# Patient Record
Sex: Female | Born: 1935 | Race: White | Hispanic: No | Smoking: Never smoker
Health system: Southern US, Community
[De-identification: ages and names within clinical notes are randomized; demographics above are authoritative.]

## PROBLEM LIST (undated history)

## (undated) DIAGNOSIS — S060X9A Concussion with loss of consciousness of unspecified duration, initial encounter: Secondary | ICD-10-CM

## (undated) DIAGNOSIS — I4891 Unspecified atrial fibrillation: Secondary | ICD-10-CM

## (undated) DIAGNOSIS — C801 Malignant (primary) neoplasm, unspecified: Secondary | ICD-10-CM

## (undated) DIAGNOSIS — I10 Essential (primary) hypertension: Secondary | ICD-10-CM

## (undated) DIAGNOSIS — M810 Age-related osteoporosis without current pathological fracture: Secondary | ICD-10-CM

## (undated) HISTORY — PX: JOINT REPLACEMENT: SHX530

## (undated) HISTORY — DX: Age-related osteoporosis without current pathological fracture: M81.0

## (undated) HISTORY — PX: BOWEL RESECTION: SHX1257

## (undated) HISTORY — PX: COLONOSCOPY W/ POLYPECTOMY: SHX1380

---

## 1958-09-22 DIAGNOSIS — S060X9A Concussion with loss of consciousness of unspecified duration, initial encounter: Secondary | ICD-10-CM

## 1958-09-22 DIAGNOSIS — S060XAA Concussion with loss of consciousness status unknown, initial encounter: Secondary | ICD-10-CM

## 1958-09-22 HISTORY — DX: Concussion with loss of consciousness of unspecified duration, initial encounter: S06.0X9A

## 1958-09-22 HISTORY — DX: Concussion with loss of consciousness status unknown, initial encounter: S06.0XAA

## 1979-09-23 HISTORY — PX: BREAST LUMPECTOMY: SHX2

## 1999-02-08 ENCOUNTER — Other Ambulatory Visit: Admission: RE | Admit: 1999-02-08 | Discharge: 1999-02-08 | Payer: Self-pay | Admitting: Cardiology

## 2000-01-31 ENCOUNTER — Ambulatory Visit (HOSPITAL_COMMUNITY): Admission: RE | Admit: 2000-01-31 | Discharge: 2000-01-31 | Payer: Self-pay | Admitting: Gastroenterology

## 2001-04-27 ENCOUNTER — Other Ambulatory Visit: Admission: RE | Admit: 2001-04-27 | Discharge: 2001-04-27 | Payer: Self-pay | Admitting: Family Medicine

## 2002-01-28 ENCOUNTER — Ambulatory Visit (HOSPITAL_COMMUNITY): Admission: RE | Admit: 2002-01-28 | Discharge: 2002-01-28 | Payer: Self-pay | Admitting: Gastroenterology

## 2002-01-28 ENCOUNTER — Encounter (INDEPENDENT_AMBULATORY_CARE_PROVIDER_SITE_OTHER): Payer: Self-pay | Admitting: Specialist

## 2005-04-21 ENCOUNTER — Encounter: Admission: RE | Admit: 2005-04-21 | Discharge: 2005-04-21 | Payer: Self-pay | Admitting: Surgery

## 2005-05-05 ENCOUNTER — Encounter (INDEPENDENT_AMBULATORY_CARE_PROVIDER_SITE_OTHER): Payer: Self-pay | Admitting: Specialist

## 2005-05-05 ENCOUNTER — Inpatient Hospital Stay (HOSPITAL_COMMUNITY): Admission: RE | Admit: 2005-05-05 | Discharge: 2005-05-09 | Payer: Self-pay | Admitting: Surgery

## 2009-05-22 ENCOUNTER — Encounter: Admission: RE | Admit: 2009-05-22 | Discharge: 2009-05-22 | Payer: Self-pay | Admitting: Neurology

## 2009-09-22 HISTORY — PX: FEMUR SURGERY: SHX943

## 2010-02-07 ENCOUNTER — Inpatient Hospital Stay (HOSPITAL_COMMUNITY): Admission: EM | Admit: 2010-02-07 | Discharge: 2010-02-12 | Payer: Self-pay | Admitting: Emergency Medicine

## 2010-05-08 ENCOUNTER — Encounter: Admission: RE | Admit: 2010-05-08 | Discharge: 2010-05-08 | Payer: Self-pay | Admitting: Neurology

## 2010-10-13 ENCOUNTER — Encounter: Payer: Self-pay | Admitting: Neurology

## 2010-12-09 LAB — CBC
HCT: 26.9 % — ABNORMAL LOW (ref 36.0–46.0)
Hemoglobin: 11 g/dL — ABNORMAL LOW (ref 12.0–15.0)
Hemoglobin: 8.9 g/dL — ABNORMAL LOW (ref 12.0–15.0)
Hemoglobin: 9.4 g/dL — ABNORMAL LOW (ref 12.0–15.0)
MCV: 90.5 fL (ref 78.0–100.0)
Platelets: 156 10*3/uL (ref 150–400)
Platelets: 175 10*3/uL (ref 150–400)
RBC: 3.06 MIL/uL — ABNORMAL LOW (ref 3.87–5.11)
RBC: 3.11 MIL/uL — ABNORMAL LOW (ref 3.87–5.11)
RBC: 3.63 MIL/uL — ABNORMAL LOW (ref 3.87–5.11)
RDW: 13.3 % (ref 11.5–15.5)
RDW: 13.7 % (ref 11.5–15.5)
RDW: 14.1 % (ref 11.5–15.5)
WBC: 13.6 10*3/uL — ABNORMAL HIGH (ref 4.0–10.5)
WBC: 5.8 10*3/uL (ref 4.0–10.5)
WBC: 6.3 10*3/uL (ref 4.0–10.5)

## 2010-12-09 LAB — BASIC METABOLIC PANEL
CO2: 27 mEq/L (ref 19–32)
Chloride: 102 mEq/L (ref 96–112)
Creatinine, Ser: 0.77 mg/dL (ref 0.4–1.2)
GFR calc Af Amer: >60 mL/min (ref >60)
Potassium: 3.3 mEq/L — ABNORMAL LOW (ref 3.5–5.1)
Sodium: 138 mEq/L (ref 135–145)

## 2010-12-09 LAB — POCT I-STAT, CHEM 8
Creatinine, Ser: 0.9 mg/dL (ref 0.4–1.2)
Glucose, Bld: 141 mg/dL — ABNORMAL HIGH (ref 70–99)
TCO2: 21 mmol/L (ref 0–100)

## 2010-12-09 LAB — HEMOGLOBIN AND HEMATOCRIT, BLOOD: HCT: 27.2 % — ABNORMAL LOW (ref 36.0–46.0)

## 2010-12-09 LAB — ABO/RH: ABO/RH(D): A POS

## 2011-02-07 NOTE — Discharge Summary (Signed)
Gina Mcgee, Gina Mcgee               ACCOUNT NO.:  [id number]   MEDICAL RECORD NO.:  [id number]          PATIENT TYPE:  INP   LOCATION:  1621                         FACILITY:  [facility]   PHYSICIAN:  Currie Paris, M.D.DATE OF BIRTH:  20-Jan-1930   DATE OF ADMISSION:  05/05/2005  DATE OF DISCHARGE:  05/09/2005                                 DISCHARGE SUMMARY   CCS#:  [id number].   FINAL DIAGNOSES:  1.  Sigmoid diverticulosis with diverticular stricture.  2.  Tubovillous adenoma of sigmoid.   CLINICAL HISTORY:  Ms. Cua is a 77 year old lady whose been having  chronic problems with diverticular, stricture and colon symptoms from that  as well as known diverticulosis. She agreed to proceed to sigmoid colectomy.   HOSPITAL COURSE:  The patient was admitted and taken to the operating room  where a sigmoid colectomy was accomplished. She tolerated the procedure  well.   Postoperatively, she had a relatively benign course. She was able to start  on liquids, advance her diet and by the fourth postoperative day was ready  to be discharged. She did develop some mild postoperative hypokalemia which  responded to some increased potassium. By August 18, she was tolerating a  diet, virtually pain free, ambulating. Her lungs were clear and the abdomen  was benign. She was discharged.   DISCHARGE/PLAN:  Tylox for pain and to resume all usual home medications.  She is to follow-up in my office in approximately 1 week.   LABORATORY DATA:  Laboratory data included pathology report which shows  diverticulosis and an incidentally found tubovillous adenoma. Other  laboratory studies included a hemoglobin of 13.8 and a postoperative  hemoglobin of 11.0 which I thought was more equilibration than significant  blood loss. Electrolytes were basically unremarkable. Urine was negative.  EKG was normal.      Currie Paris, M.D.  Electronically Signed     CJS/MEDQ  D:  06/02/2005  T:   06/02/2005  Job:  [id number]   cc:   Ernestina Penna, M.D.  [address] [place]  [place] [address]  Fax: [phone]   Tasia Catchings, M.D.  [address]  [place]  [place] [address]  Fax: [phone]

## 2011-02-07 NOTE — Op Note (Signed)
[name], Mcgee               ACCOUNT NO.:  [id number]   MEDICAL RECORD NO.:  [id number]          PATIENT TYPE:  INP   LOCATION:  NA                           FACILITY:  [facility]   PHYSICIAN:  Currie Paris, M.D.DATE OF BIRTH:  [birth year]   DATE OF PROCEDURE:  05/05/2005  DATE OF DISCHARGE:                                 OPERATIVE REPORT   OFFICE MEDICAL RECORD NUMBER:  [id number].   PREOPERATIVE DIAGNOSIS:  Sigmoid diverticular disease with sigmoid  stricture.   POSTOPERATIVE DIAGNOSIS:  Sigmoid diverticular disease with sigmoid  stricture.   OPERATION:  Sigmoid colectomy with primary anastomosis.   SURGEON:  Dr. Jamey Ripa.   ASSISTANTS:  Dr. Lebron Conners, also scrubbed [name], [place] student.   ANESTHESIA:  General endotracheal.   CLINICAL HISTORY:  This is a 77 year old lady with chronic diverticular  disease, who has a stricture that would barely admit a pediatric  colonoscope.  Because of ongoing symptoms, she elected proceed to sigmoid  colectomy.  She has diffuse pandiverticulosis, but most of the disease was  confined to the sigmoid colon.   DESCRIPTION OF PROCEDURE:  The patient seen in the holding area, and she had  no further questions.  She was taken to the operating room and after  satisfactory general endotracheal anesthesia had been obtained, Foley  catheter was placed and the abdomen prepped and draped.  The time-out  occurred.   I made a midline incision from umbilicus to symphysis and entered the  peritoneal cavity in the midline.  Exam of the abdomen showed a normal  uterus with atrophic ovaries consistent with her age.  There was marked  diverticula disease with the left tube stuck to the sigmoid and marked  tortuosity and what appeared to be some narrowing in the proximal sigmoid.   After exploration, I placed a wound protector and self-retaining retractors  and packed the small bowel away.   I freed up the sigmoid colon from mid  descending all the way down to the  rectum using sharp and blunt dissection and opening the white line of Toldt.  I selected a place above the junction of the descending and sigmoid colon  where itappeared to be relatively free of diverticular disease with most of  the disease distal to this spot.  I made a window in the mesentery, placed  bowel clamps, and divided the bowel.  The colon mesentery was then divided  from that point down to the rectosigmoid junction where the tinea had ended.  I stayed close to the colon.  I also identified the ureter prior to  beginning the mesenteric division and at the end to make sure that it was  undamaged, and it appeared intact throughout.   I then put some stay sutures on and a Satinsky clamp for a cutting guide and  divided the rectum.  The two ends overlapped by about 3 inches so there was  no tension.   I then did a hand sewn end-to-end anastomosis using 3-0 silks, full-  thickness, inverting the anterior layer.  The anastomosis was widely patent  and appeared completely intact with no areas where anything appeared to be  separated.  The mesenteric defect was closed with a single 3-0 silk.  At  this point, we changed gloves.  I irrigated and made sure everything was  dry.  I then used Tisseel and placed it circumferentially on the anastomosis  just as an extra layer of reinforcement.   The intestinal contents were then replaced to their anatomic location and  the abdomen closed.  I used a 0 PDS on the posterior sheath peritoneum and  #1 running PDS on the anterior sheath with staples on the skin.  The wound  was irrigated between layers for closure.   The patient tolerated procedure well.  There were no operative  complications.  All counts were correct.      Currie Paris, M.D.  Electronically Signed     CJS/MEDQ  D:  05/05/2005  T:  05/05/2005  Job:  [id number]   cc:   Ernestina Penna, M.D.  [address] [place]  [place]  [address]  Fax: [phone]   Tasia Catchings, M.D.  [address]  [place]  [place] [address]  Fax: [phone]

## 2011-02-10 ENCOUNTER — Other Ambulatory Visit: Payer: Self-pay | Admitting: Gastroenterology

## 2011-11-20 ENCOUNTER — Observation Stay (HOSPITAL_COMMUNITY)
Admission: EM | Admit: 2011-11-20 | Discharge: 2011-11-21 | Disposition: A | Discharge status: Home / Self Care | Payer: Medicare Other | Attending: Internal Medicine | Admitting: Internal Medicine

## 2011-11-20 ENCOUNTER — Emergency Department (HOSPITAL_COMMUNITY): Payer: Medicare Other

## 2011-11-20 ENCOUNTER — Encounter (HOSPITAL_COMMUNITY): Payer: Self-pay | Admitting: Family Medicine

## 2011-11-20 DIAGNOSIS — M899 Disorder of bone, unspecified: Secondary | ICD-10-CM | POA: Insufficient documentation

## 2011-11-20 DIAGNOSIS — M25559 Pain in unspecified hip: Secondary | ICD-10-CM | POA: Diagnosis present

## 2011-11-20 DIAGNOSIS — C50919 Malignant neoplasm of unspecified site of unspecified female breast: Secondary | ICD-10-CM | POA: Diagnosis not present

## 2011-11-20 DIAGNOSIS — S32599A Other specified fracture of unspecified pubis, initial encounter for closed fracture: Secondary | ICD-10-CM

## 2011-11-20 DIAGNOSIS — E871 Hypo-osmolality and hyponatremia: Secondary | ICD-10-CM | POA: Diagnosis not present

## 2011-11-20 DIAGNOSIS — R29818 Other symptoms and signs involving the nervous system: Secondary | ICD-10-CM | POA: Diagnosis not present

## 2011-11-20 DIAGNOSIS — E876 Hypokalemia: Secondary | ICD-10-CM | POA: Diagnosis not present

## 2011-11-20 DIAGNOSIS — S329XXA Fracture of unspecified parts of lumbosacral spine and pelvis, initial encounter for closed fracture: Secondary | ICD-10-CM

## 2011-11-20 DIAGNOSIS — W19XXXA Unspecified fall, initial encounter: Secondary | ICD-10-CM

## 2011-11-20 DIAGNOSIS — S32509A Unspecified fracture of unspecified pubis, initial encounter for closed fracture: Secondary | ICD-10-CM | POA: Diagnosis not present

## 2011-11-20 DIAGNOSIS — I10 Essential (primary) hypertension: Secondary | ICD-10-CM | POA: Diagnosis not present

## 2011-11-20 DIAGNOSIS — Z853 Personal history of malignant neoplasm of breast: Secondary | ICD-10-CM | POA: Diagnosis not present

## 2011-11-20 DIAGNOSIS — Y92009 Unspecified place in unspecified non-institutional (private) residence as the place of occurrence of the external cause: Secondary | ICD-10-CM | POA: Insufficient documentation

## 2011-11-20 DIAGNOSIS — C801 Malignant (primary) neoplasm, unspecified: Secondary | ICD-10-CM

## 2011-11-20 DIAGNOSIS — N949 Unspecified condition associated with female genital organs and menstrual cycle: Secondary | ICD-10-CM | POA: Diagnosis not present

## 2011-11-20 DIAGNOSIS — S060XAA Concussion with loss of consciousness status unknown, initial encounter: Secondary | ICD-10-CM | POA: Diagnosis present

## 2011-11-20 DIAGNOSIS — R52 Pain, unspecified: Secondary | ICD-10-CM | POA: Insufficient documentation

## 2011-11-20 DIAGNOSIS — S060X0A Concussion without loss of consciousness, initial encounter: Secondary | ICD-10-CM | POA: Diagnosis not present

## 2011-11-20 DIAGNOSIS — Y998 Other external cause status: Secondary | ICD-10-CM | POA: Insufficient documentation

## 2011-11-20 DIAGNOSIS — W010XXA Fall on same level from slipping, tripping and stumbling without subsequent striking against object, initial encounter: Secondary | ICD-10-CM | POA: Insufficient documentation

## 2011-11-20 DIAGNOSIS — T148XXA Other injury of unspecified body region, initial encounter: Secondary | ICD-10-CM | POA: Diagnosis not present

## 2011-11-20 HISTORY — DX: Essential (primary) hypertension: I10

## 2011-11-20 HISTORY — DX: Unspecified fall, initial encounter: W19.XXXA

## 2011-11-20 HISTORY — DX: Concussion with loss of consciousness of unspecified duration, initial encounter: S06.0X9A

## 2011-11-20 HISTORY — DX: Malignant (primary) neoplasm, unspecified: C80.1

## 2011-11-20 HISTORY — DX: Fracture of unspecified parts of lumbosacral spine and pelvis, initial encounter for closed fracture: S32.9XXA

## 2011-11-20 LAB — CBC
Hemoglobin: 12.3 g/dL (ref 12.0–15.0)
MCH: 29.3 pg (ref 26.0–34.0)
MCHC: 33.2 g/dL (ref 30.0–36.0)
MCV: 88.3 fL (ref 78.0–100.0)
RBC: 4.2 MIL/uL (ref 3.87–5.11)

## 2011-11-20 LAB — BASIC METABOLIC PANEL
CO2: 27 mEq/L (ref 19–32)
Calcium: 9.2 mg/dL (ref 8.4–10.5)
Creatinine, Ser: 0.6 mg/dL (ref 0.50–1.10)
GFR calc non Af Amer: 84 mL/min — ABNORMAL LOW (ref >90)
Glucose, Bld: 109 mg/dL — ABNORMAL HIGH (ref 70–99)

## 2011-11-20 MED ORDER — DOCUSATE SODIUM 100 MG PO CAPS
100.0000 mg | ORAL_CAPSULE | Freq: Two times a day (BID) | ORAL | Status: DC
  Administered 2011-11-21: 100 mg via ORAL
  Filled 2011-11-20 (×5): qty 1

## 2011-11-20 MED ORDER — POLYETHYLENE GLYCOL 3350 17 G PO PACK
17.0000 g | PACK | Freq: Every day | ORAL | Status: DC | PRN
  Filled 2011-11-20: qty 1

## 2011-11-20 MED ORDER — ACETAMINOPHEN 325 MG PO TABS: 650.0000 mg | ORAL_TABLET | Freq: Four times a day (QID) | ORAL | Status: DC | PRN

## 2011-11-20 MED ORDER — PROSIGHT PO TABS
1.0000 | ORAL_TABLET | Freq: Every day | ORAL | Status: DC
  Administered 2011-11-21: 1 via ORAL
  Filled 2011-11-20 (×2): qty 1

## 2011-11-20 MED ORDER — ACETAMINOPHEN 650 MG RE SUPP: 650.0000 mg | Freq: Four times a day (QID) | RECTAL | Status: DC | PRN

## 2011-11-20 MED ORDER — VITAMIN D3 25 MCG (1000 UT) PO CAPS: 1.0000 | ORAL_CAPSULE | Freq: Every day | ORAL | Status: DC

## 2011-11-20 MED ORDER — POTASSIUM CHLORIDE CRYS ER 20 MEQ PO TBCR
20.0000 meq | EXTENDED_RELEASE_TABLET | Freq: Once | ORAL | Status: AC
  Administered 2011-11-20: 20 meq via ORAL
  Filled 2011-11-20: qty 1

## 2011-11-20 MED ORDER — VITAMIN D3 25 MCG (1000 UNIT) PO TABS
1000.0000 [IU] | ORAL_TABLET | Freq: Every day | ORAL | Status: DC
  Filled 2011-11-20 (×2): qty 1

## 2011-11-20 MED ORDER — CALCIUM CARBONATE 1250 (500 CA) MG PO TABS
1250.0000 mg | ORAL_TABLET | Freq: Every day | ORAL | Status: DC
  Filled 2011-11-20 (×2): qty 1

## 2011-11-20 MED ORDER — HYDROCODONE-ACETAMINOPHEN 5-325 MG PO TABS
1.0000 | ORAL_TABLET | Freq: Four times a day (QID) | ORAL | Status: DC | PRN
  Administered 2011-11-21 (×2): 2 via ORAL
  Filled 2011-11-20 (×2): qty 2

## 2011-11-20 MED ORDER — ICAPS AREDS FORMULA PO TABS: 1.0000 | ORAL_TABLET | Freq: Every day | ORAL | Status: DC

## 2011-11-20 MED ORDER — HYDROCHLOROTHIAZIDE 25 MG PO TABS
25.0000 mg | ORAL_TABLET | Freq: Every day | ORAL | Status: DC
  Filled 2011-11-20: qty 1

## 2011-11-20 MED ORDER — MORPHINE SULFATE 2 MG/ML IJ SOLN: 1.0000 mg | INTRAMUSCULAR | Status: DC | PRN

## 2011-11-20 MED ORDER — SENNA 8.6 MG PO TABS
1.0000 | ORAL_TABLET | Freq: Two times a day (BID) | ORAL | Status: DC
  Administered 2011-11-21: 8.6 mg via ORAL
  Filled 2011-11-20: qty 1

## 2011-11-20 MED ORDER — ENOXAPARIN SODIUM 40 MG/0.4ML ~~LOC~~ SOLN
40.0000 mg | Freq: Every day | SUBCUTANEOUS | Status: DC
  Filled 2011-11-20 (×2): qty 0.4

## 2011-11-20 NOTE — ED Provider Notes (Signed)
History     CSN: [id number]  Arrival date & time 11/20/11  1630   First MD Initiated Contact with Patient 11/20/11 1639      Chief Complaint  Patient presents with  . Hip Pain  . Fall    (Consider location/radiation/quality/duration/timing/severity/associated sxs/prior treatment) Patient is a 78 y.o. female presenting with hip pain and fall. The history is provided by the patient.  Hip Pain Associated symptoms include arthralgias. Pertinent negatives include no abdominal pain, chest pain, chills, fever, nausea, numbness, rash, vomiting or weakness.  Fall Pertinent negatives include no fever, no numbness, no abdominal pain, no nausea and no vomiting.   78 year old female with history of hypertension presents with left hip pain status post fall. She states that she was walking in the kitchen earlier this afternoon when her dog ran in front of her and tripped her. She fell on her left hip on the tile floor. She was unable to get up on her own. She is status post ORIF of the left hip, which was performed by Dr. Rennis Chris several years ago. Denies any distal numbness or weakness. She denies hitting her head or loss of consciousness. Denies nausea, vomiting, abdominal pain, chest pain, shortness of breath. She denies any other musculoskeletal pain.  Past Medical History  Diagnosis Date  . 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 Cancer     left-sided breast cancer    Past Surgical History  Procedure Date  . Joint replacement     History reviewed. No pertinent family history.  History  Substance Use Topics  . Smoking status: Not on file  . Smokeless tobacco: Not on file  . Alcohol Use:     OB History    Grav Para Term Preterm Abortions TAB SAB Ect Mult Living                  Review of Systems  Constitutional: Negative for fever, chills, activity change and appetite change.  HENT: Negative.   Eyes: Negative.   Respiratory: Negative for chest tightness and  shortness of breath.   Cardiovascular: Negative for chest pain and palpitations.  Gastrointestinal: Negative for nausea, vomiting, abdominal pain and abdominal distention.  Genitourinary: Positive for pelvic pain.  Musculoskeletal: Positive for arthralgias.  Skin: Negative for color change and rash.  Neurological: Negative for dizziness, weakness and numbness.    Allergies  Review of patient's allergies indicates no known allergies.  Home Medications  No current outpatient prescriptions on file.  BP 122/99  Pulse 94  Temp(Src) 97.6 F (36.4 C) (Oral)  Resp 18  SpO2 95%  Physical Exam  Nursing note and vitals reviewed. Constitutional: She is oriented to person, place, and time. She appears well-developed and well-nourished. No distress.  HENT:  Head: Normocephalic and atraumatic.  Eyes: EOM are normal. Pupils are equal, round, and reactive to light.  Neck: Normal range of motion.  Cardiovascular: Normal rate, regular rhythm and normal heart sounds.   Pulmonary/Chest: Effort normal and breath sounds normal. She has no wheezes. She exhibits no tenderness.  Abdominal: Soft. Bowel sounds are normal. There is no tenderness. There is no rebound and no guarding.  Musculoskeletal:       Bony tenderness to palpation over the right pelvis and posterior hip. There is a well-healed surgical scar noted. She is able to move, although this causes severe pain. There is no shortening or rotation noted. She is able to wiggle her toes. Lower extremity is neurovascularly intact  bilaterally with sensory intact to light touch. DP/PT pulses intact. Capillary refill less than 3 seconds.  Neurological: She is alert and oriented to person, place, and time.  Skin: Skin is warm and dry. No rash noted. She is not diaphoretic.  Psychiatric: She has a normal mood and affect.    ED Course  Procedures (including critical care time)  Labs Reviewed  BASIC METABOLIC PANEL - Abnormal; Notable for the following:     Sodium 134 (*)    Potassium 3.4 (*)    Glucose, Bld 109 (*)    GFR calc non Af Amer 84 (*)    All other components within normal limits  CBC   Dg Sacrum/coccyx  11/20/2011  *RADIOLOGY REPORT*  Clinical Data: Left hip and pelvic pain, question sacral fracture  SACRUM AND COCCYX - 2+ VIEW  Comparison: None  Findings: Severe osseous demineralization limits exam. SI joints appear symmetric and preserved. Orthopedic hardware proximal left femur. No definite sacrococcygeal fracture identified.  IMPRESSION: Severe osseous demineralization. No definite acute bony abnormalities.  Original Report Authenticated By: Lollie Marrow, M.D.   Dg Hip Complete Left  11/20/2011  *RADIOLOGY REPORT*  Clinical Data: Fall.  Left hip pain.  LEFT HIP - COMPLETE 2+ VIEW  Comparison: 02/10/2010  Findings: Left hip gamma nail remains in place with single distal interlocking screw.  Progressive calcification above the left greater trochanter appears chronic, and there is also progressive spurring along the lesser trochanter which has a chronic appearance.  Mildly progressive spurring of the left femoral head noted. Deformity from the prior intertrochanteric fracture is present.  There are acute fractures of the left superior and inferior pubic rami.  There is a high likelihood of other pelvic fractures, most likely a sacral fracture, based on typical fracture patterns.  Bony demineralization noted.  IMPRESSION: 1.  Acute fractures of the left pubic rami noted.  There is very likely a sacral fracture. 2.  No acute left proximal femoral fracture noted; a gamma nail remains in place.  Original Report Authenticated By: Dellia Cloud, M.D.   I personally reviewed the plain films.   1. Fracture of pubic ramus       MDM  Pt s/p ORIF of L hip who fell striking her left hip at home today. Her plain films show that the hardware is in place without surrounding fracture but there appears to be 2 fractures of the L pubic  rami. Nursing staff attempted to ambulate the pt; she was able to help with transfers but could not bear weight at all on the leg. Given this, will plan to admit for pain control, rehab. Pt and family agreeable to plan.  8:00 PM Discussed the case with Dr. Betti Cruz with [facility], who agrees to admit.       Grant Fontana, [place] 11/20/11 2012

## 2011-11-20 NOTE — ED Notes (Signed)
Per EMS: Pt from home. States dogs ran between her legs and tripped her and she landed on left hip. Reports left hip replacement x2 years ago. Denies loc or hitting her head. Pt was given of fentanyl pta.

## 2011-11-20 NOTE — ED Notes (Signed)
Bed:WA13<BR> Expected date:<BR> Expected time:<BR> Means of arrival:<BR> Comments:<BR> EMS

## 2011-11-20 NOTE — ED Provider Notes (Signed)
5:48 PM  I performed a history and physical examination of Gina Mcgee and discussed her management with Grant Fontana.  I agree with the history, physical, assessment, and plan of care, with the following exceptions: None  I've seen the patient face-to-face and personally evaluated her. She is in no apparent distress, awake, alert, and oriented appropriately. She is normocephalic and atraumatic with regard to her craniofacial structures. She has no neck or back pain. Her breathing is regular and unlabored with normal oxygen saturation. She has no abdominal tenderness. Her pelvis is stable. Her left lower extremity, she has some tenderness to palpation at the proximal and posterior left thigh, without deformity or bruising. She has distal neurovascular status intact, and has only mild discomfort when rolling the left lower extremity through inversion and eversion.  I was present for the following procedures: None Time Spent in Critical Care of the patient: None Time spent in discussions with the patient and family: 5 minutes  Yohannes Waibel D    Felisa Bonier, MD 11/20/11 [phone]

## 2011-11-20 NOTE — ED Provider Notes (Signed)
Evaluation and management procedures were performed by the PA/NP under my supervision/collaboration.  I evaluated this patient face-to-face at the time of encounter.  Please see my note dated at that time.   Felisa Bonier, MD 11/20/11 2034

## 2011-11-20 NOTE — H&P (Addendum)
Patient's PCP: Redmond Baseman, MD, MD Patient's orthopedic physician: Dr. Rennis Chris  Chief Complaint: Fall and hip pain  History of Present Illness: Gina Mcgee is a 78 y.o. Caucasian female with history of mild hypertension, left-sided breast cancer, and history of concussion with left-sided neuro deficits who presented with the above complaints.  At approximately 345 p.m. today patient was told by her dog to the left side in her home and landed on her left side and had sharp pain.  In the emergency department patient had imaging which showed acute fractures of the left pubic rami.  Patient attempted to walk but had severe pain and could not bear weight as a result the hospitalist service was asked to admit the patient for further care and management.  Patient denies any fevers, chills, nausea, vomiting, chest pain, shortness of breath, abdominal pain, headaches or vision changes.  Reports her health has been good prior to today's event.  Meds: Scheduled Meds:   . potassium chloride  20 mEq Oral Once   Continuous Infusions:  PRN Meds:. Allergies: Aspirin; Fish allergy; Nsaids; Aloe; and Sulfa antibiotics Past Medical History  Diagnosis Date  . 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 Cancer     left-sided breast cancer  . HTN (hypertension)    Past Surgical History  Procedure Date  . Joint replacement    Family History  Problem Relation Age of Onset  . Stroke Mother   . Lung cancer Father   . Stroke Father    History   Social History  . Marital Status: Single    Spouse Name: N/A    Number of Children: N/A  . Years of Education: N/A   Occupational History  . Not on file.   Social History Main Topics  . Smoking status: Never Smoker   . Smokeless tobacco: Not on file  . Alcohol Use: Yes     1 beer 2-3 weeks.  . Drug Use:   . Sexually Active:    Other Topics Concern  . Not on file   Social History Narrative  . No narrative on file   Review  of Systems: All systems reviewed with the patient and positive as per history of present illness, otherwise all other systems are negative.  Physical Exam: Blood pressure 141/58, pulse 85, temperature 98.1 F (36.7 C), temperature source Oral, resp. rate 20, SpO2 95.00%. General: Awake, Oriented x3, No acute distress. HEENT: EOMI, Moist mucous membranes Neck: Supple CV: S1 and S2 Lungs: Clear to ascultation bilaterally Abdomen: Soft, Nontender, Nondistended, +bowel sounds. Ext: Good pulses. Trace edema. No clubbing or cyanosis noted.  Attempted to raise her right and left leg passively for hip flexion, patient had severe pain on the left. Neuro: Cranial Nerves II-XII grossly intact. Has 5/5 motor strength in upper and lower extremities.  Lab results:  [facility] 11/20/11 1815  NA 134*  K 3.4*  CL 97  CO2 27  GLUCOSE 109*  BUN 17  CREATININE 0.60  CALCIUM 9.2  MG --  PHOS --   No results found for this basename: AST:2,ALT:2,ALKPHOS:2,BILITOT:2,PROT:2,ALBUMIN:2 in the last 72 hours No results found for this basename: LIPASE:2,AMYLASE:2 in the last 72 hours  Basename 11/20/11 1815  WBC 9.9  NEUTROABS --  HGB 12.3  HCT 37.1  MCV 88.3  PLT 231   No results found for this basename: CKTOTAL:3,CKMB:3,CKMBINDEX:3,TROPONINI:3 in the last 72 hours No components found with this basename: POCBNP:3 No results found for this basename: DDIMER  in the last 72 hours No results found for this basename: HGBA1C:2 in the last 72 hours No results found for this basename: CHOL:2,HDL:2,LDLCALC:2,TRIG:2,CHOLHDL:2,LDLDIRECT:2 in the last 72 hours No results found for this basename: TSH,T4TOTAL,FREET3,T3FREE,THYROIDAB in the last 72 hours No results found for this basename: VITAMINB12:2,FOLATE:2,FERRITIN:2,TIBC:2,IRON:2,RETICCTPCT:2 in the last 72 hours Imaging results:  Dg Sacrum/coccyx  11/20/2011  *RADIOLOGY REPORT*  Clinical Data: Left hip and pelvic pain, question sacral fracture  SACRUM AND  COCCYX - 2+ VIEW  Comparison: None  Findings: Severe osseous demineralization limits exam. SI joints appear symmetric and preserved. Orthopedic hardware proximal left femur. No definite sacrococcygeal fracture identified.  IMPRESSION: Severe osseous demineralization. No definite acute bony abnormalities.  Original Report Authenticated By: Lollie Marrow, M.D.   Dg Hip Complete Left  11/20/2011  *RADIOLOGY REPORT*  Clinical Data: Fall.  Left hip pain.  LEFT HIP - COMPLETE 2+ VIEW  Comparison: 02/10/2010  Findings: Left hip gamma nail remains in place with single distal interlocking screw.  Progressive calcification above the left greater trochanter appears chronic, and there is also progressive spurring along the lesser trochanter which has a chronic appearance.  Mildly progressive spurring of the left femoral head noted. Deformity from the prior intertrochanteric fracture is present.  There are acute fractures of the left superior and inferior pubic rami.  There is a high likelihood of other pelvic fractures, most likely a sacral fracture, based on typical fracture patterns.  Bony demineralization noted.  IMPRESSION: 1.  Acute fractures of the left pubic rami noted.  There is very likely a sacral fracture. 2.  No acute left proximal femoral fracture noted; a gamma nail remains in place.  Original Report Authenticated By: Dellia Cloud, M.D.    Assessment & Plan by Problem:  1.  Acute hip pain due to fracture of left pubic rami.  Contacted Dr. Charlann Boxer, covering for Dr. Rennis Chris patient's orthopedic physician, who indicated conservative management with pain control and physical therapy.  Patient to bear weight as tolerated.  Patient to followup with Dr. supple as outpatient in few weeks.  Start the patient on a bowel regimen to prevent constipation from pain medications.  2.  Mechanical fall/history of concussion (in 1960s) with residual left-sided deficits.  Continue management as per physical  therapy.  3.  Hypertension.  Continue hydrochlorothiazide.  4.  History of osteoporosis/osteopenia.  Continue calcium with vitamin D.  5.  Hypokalemia.  Replace as needed.  6.  Mild hyponatremia.  Likely due to pain.  Monitor.  7.  Prophylaxis.  Lovenox.  8.  CODE STATUS.  Full code.  9.  Disposition.  Admit the patient as observation, depending on patient's clinical course arrange for home health PT versus rehabilitation.  Time spent on admission, talking to the patient, and coordinating care was: 45 mins.  Gredmarie Delange A, MD 11/20/2011, 8:38 PM

## 2011-11-21 DIAGNOSIS — E871 Hypo-osmolality and hyponatremia: Secondary | ICD-10-CM | POA: Diagnosis not present

## 2011-11-21 DIAGNOSIS — S329XXA Fracture of unspecified parts of lumbosacral spine and pelvis, initial encounter for closed fracture: Secondary | ICD-10-CM | POA: Diagnosis not present

## 2011-11-21 DIAGNOSIS — E876 Hypokalemia: Secondary | ICD-10-CM | POA: Diagnosis not present

## 2011-11-21 DIAGNOSIS — S32599A Other specified fracture of unspecified pubis, initial encounter for closed fracture: Secondary | ICD-10-CM | POA: Diagnosis present

## 2011-11-21 DIAGNOSIS — M25559 Pain in unspecified hip: Secondary | ICD-10-CM | POA: Diagnosis not present

## 2011-11-21 LAB — CBC
MCH: 28.9 pg (ref 26.0–34.0)
MCHC: 32.9 g/dL (ref 30.0–36.0)
Platelets: 179 10*3/uL (ref 150–400)
RDW: 13.7 % (ref 11.5–15.5)

## 2011-11-21 LAB — BASIC METABOLIC PANEL
BUN: 19 mg/dL (ref 6–23)
Calcium: 9.2 mg/dL (ref 8.4–10.5)
Creatinine, Ser: 0.64 mg/dL (ref 0.50–1.10)
GFR calc non Af Amer: 83 mL/min — ABNORMAL LOW (ref >90)
Glucose, Bld: 103 mg/dL — ABNORMAL HIGH (ref 70–99)

## 2011-11-21 MED ORDER — POTASSIUM CHLORIDE CRYS ER 20 MEQ PO TBCR
40.0000 meq | EXTENDED_RELEASE_TABLET | Freq: Once | ORAL | Status: AC
  Administered 2011-11-21: 40 meq via ORAL
  Filled 2011-11-21: qty 2

## 2011-11-21 MED ORDER — POTASSIUM CHLORIDE CRYS ER 20 MEQ PO TBCR: 20.0000 meq | EXTENDED_RELEASE_TABLET | Freq: Every day | ORAL | Status: DC

## 2011-11-21 MED ORDER — DSS 100 MG PO CAPS: 100.0000 mg | ORAL_CAPSULE | Freq: Two times a day (BID) | ORAL | Status: AC

## 2011-11-21 MED ORDER — HYDROCODONE-ACETAMINOPHEN 5-325 MG PO TABS: 1.0000 | ORAL_TABLET | Freq: Four times a day (QID) | ORAL | Status: AC | PRN

## 2011-11-21 MED ORDER — POLYETHYLENE GLYCOL 3350 17 G PO PACK: 17.0000 g | PACK | Freq: Every day | ORAL | Status: AC | PRN

## 2011-11-21 NOTE — Progress Notes (Signed)
Patient being discharged in stable condition; Discharge instructions and scripts given to patient. Patient verbalizes understanding

## 2011-11-21 NOTE — Evaluation (Signed)
Physical Therapy Evaluation Patient Details Name: Gina Mcgee MRN: [id number] DOB: 05/24/1930 Today's Date: 11/21/2011  Problem List:  Patient Active Problem List  Diagnoses  . Hip pain, acute  . Fall  . HTN (hypertension)  . Concussion  . History of breast cancer in female  . Hyponatremia  . Hypokalemia  . Pelvic fracture    Past Medical History:  Past Medical History  Diagnosis Date  . 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 Cancer     left-sided breast cancer  . HTN (hypertension)   . Brain concussion 1960   Past Surgical History:  Past Surgical History  Procedure Date  . Joint replacement   . Femur surgery 2011    left femur pinning  . Bowel resection approx. in 2007    polyp and formed stricture  . Breast lumpectomy 1981    left lumpectomy w/ radiation    PT Assessment/Plan/Recommendation PT Assessment Clinical Impression Statement: Patient s/p fall with pelvic fracture presents at supervision level for mobility.  She will have spouse to assist (although he uses a cane himself,) and daughter and sons to assist as well.  Feel she can go home with family assist and HHPT with equipment for home and [facility] aide.  Did educated in home safety for fall prevention. PT Recommendation/Assessment: Patient will need skilled PT in the acute care venue PT Problem List: Decreased strength;Decreased activity tolerance;Decreased mobility;Pain PT Therapy Diagnosis : Difficulty walking;Acute pain PT Plan PT Frequency: Min 5X/week PT Treatment/Interventions: Functional mobility training;DME instruction;Patient/family education;Wheelchair mobility training;Therapeutic exercise;Therapeutic activities PT Recommendation Follow Up Recommendations: Home health PT;Supervision/Assistance - 24 hour (HHaide) Equipment Recommended: Wheelchair cushion (measurements);Wheelchair (measurements) (18x18 w/ at least basic cushion) PT Goals  Acute Rehab PT Goals PT Goal Formulation:  With patient Time For Goal Achievement: 7 days Pt will go Supine/Side to Sit: with modified independence PT Goal: Supine/Side to Sit - Progress: Goal set today Pt will go Sit to Supine/Side: with modified independence PT Goal: Sit to Supine/Side - Progress: Goal set today Pt will go Sit to Stand: with modified independence PT Goal: Sit to Stand - Progress: Goal set today Pt will go Stand to Sit: with modified independence PT Goal: Stand to Sit - Progress: Goal set today Pt will Ambulate: 16 - 50 feet;with modified independence;with rolling walker PT Goal: Ambulate - Progress: Goal set today Pt will Perform Home Exercise Program: Independently PT Goal: Perform Home Exercise Program - Progress: Goal set today Pt will Propel Wheelchair: 51 - 150 feet;with modified independence PT Goal: Propel Wheelchair - Progress: Goal set today  PT Evaluation Precautions/Restrictions  Precautions Precautions: Fall Required Braces or Orthoses: No Restrictions Weight Bearing Restrictions: Yes LLE Weight Bearing: Weight bearing as tolerated Other Position/Activity Restrictions: WBAT Prior Functioning  Home Living Lives With: Spouse Receives Help From: Family Type of Home: House Home Layout: One level Home Access: Level entry Bathroom Shower/Tub: Tub/shower unit;Walk-in shower Bathroom Toilet: Handicapped height Bathroom Accessibility: Yes How Accessible: Accessible via wheelchair Home Adaptive Equipment: Walker - rolling;Grab bars in shower;Grab bars around toilet;Straight cane;Reacher Prior Function Level of Independence: Independent with basic ADLs;Independent with transfers;Independent with gait;Independent with homemaking with ambulation Driving: Yes Cognition Cognition Arousal/Alertness: Awake/alert Overall Cognitive Status: Appears within functional limits for tasks assessed Orientation Level: Oriented X4 Sensation/Coordination  Extremity Assessment  RLE Assessment RLE Assessment:  Within Functional Limits LLE Assessment LLE Assessment: Not tested (moves unassisted to edge of bed & pt assist to lift in bed) Mobility (including  Balance) Bed Mobility Bed Mobility: Yes Supine to Sit: 5: Supervision;HOB flat Supine to Sit Details (indicate cue type and reason): increased time and cues to keep legs close together Sitting - Scoot to Edge of Bed: 5: Supervision Sitting - Scoot to Edge of Bed Details (indicate cue type and reason): increased time Sit to Supine: 5: Supervision Sit to Supine - Details (indicate cue type and reason): cues to use cane or sheet to help lift left leg in the bed, patient used her husband's cane to lift left leg in the bed Transfers Transfers: Yes Sit to Stand: 5: Supervision;From bed;With upper extremity assist Sit to Stand Details (indicate cue type and reason): able to demo safe technique without cues Stand to Sit: 5: Supervision;With upper extremity assist;To bed Stand to Sit Details: demo safe technique without cues Ambulation/Gait Ambulation/Gait: Yes Ambulation/Gait Assistance: 5: Supervision;6: Modified independent (Device/Increase time) Ambulation/Gait Assistance Details (indicate cue type and reason): distant supervision to mod indep, slow speed and antalgic Ambulation Distance (Feet): 40 Feet Assistive device: Rolling walker Gait Pattern: Step-to pattern;Antalgic  Posture/Postural Control Posture/Postural Control: No significant limitations Exercise    End of Session PT - End of Session Equipment Utilized During Treatment: Gait belt Activity Tolerance: Patient tolerated treatment well Patient left: in bed;with call bell in reach;with family/visitor present General Behavior During Session: [facility] for tasks performed Cognition: [facility] for tasks performed  [facility] 11/21/2011, 1:04 PM

## 2011-11-21 NOTE — Progress Notes (Signed)
11-21-11 UR completed.

## 2011-11-21 NOTE — Discharge Summary (Signed)
Physician Discharge Summary  Patient ID: Gina Mcgee MRN: [id number] DOB/AGE: August 03, 1930 78 y.o.  Admit date: 11/20/2011 Discharge date: 11/21/2011  PCP: Redmond Baseman, MD, MD  Discharge Diagnoses:  Principal Problem:  *Pelvic fracture Active Problems:  Hip pain, acute  Fall  HTN (hypertension)  Concussion  Hyponatremia  Hypokalemia  Fracture of pubic ramus   RECOMMENDATION TO PCP: 1. Check BMET at follow up for hyponatremia 2. Have discontinued HCTZ due to above and borderline BP in hospital. Please address BP at follow up.   Discharged Condition: fair  Initial History: Gina Mcgee is a 78 y.o. Caucasian female with history of mild hypertension, left-sided breast cancer, and history of concussion with left-sided neuro deficits who presented with fall and pain in left hip. On the day of admission patient was cut off her dog resulting in a fall on to the left side in her home and landed on her left side and had sharp pain. In the emergency department patient had imaging which showed acute fractures of the left pubic rami. Patient attempted to walk but had severe pain and could not bear weight as a result the hospitalist service was asked to admit the patient for further care and management.   Consultants: Phone conversation with Dr. Esperanza Heir Course:   1. Acute hip pain due to fracture of left pubic rami. No surgical indication. Conservative management. Pain control. Bowel regimen to prevent constipation. PT and OT saw the patient and PT recommends Home health.  2. Mechanical fall/history of concussion (in 1960s) with residual left-sided deficits. Stable   3. Hypertension. BP is borderline low and along with hyponatremia, will stop HCTZ for now. Patient can follow up with PCP to discuss alternative medication.  4. History of osteoporosis/osteopenia. Continue calcium with vitamin D.   5. Hypokalemia. Repleted   6. Mild hyponatremia. Stopped HCTZ. PCP  to recheck at follow up.  7. Prophylaxis. Lovenox.   8. CODE STATUS. Full code.   After evaluation by PT it was determined that patient could go home. Her son will be available to assist. Patient is keen as well to go home.  IMAGING STUDIES Dg Sacrum/coccyx  11/20/2011  *RADIOLOGY REPORT*  Clinical Data: Left hip and pelvic pain, question sacral fracture  SACRUM AND COCCYX - 2+ VIEW  Comparison: None  Findings: Severe osseous demineralization limits exam. SI joints appear symmetric and preserved. Orthopedic hardware proximal left femur. No definite sacrococcygeal fracture identified.  IMPRESSION: Severe osseous demineralization. No definite acute bony abnormalities.  Original Report Authenticated By: Lollie Marrow, M.D.   Dg Hip Complete Left  11/20/2011  *RADIOLOGY REPORT*  Clinical Data: Fall.  Left hip pain.  LEFT HIP - COMPLETE 2+ VIEW  Comparison: 02/10/2010  Findings: Left hip gamma nail remains in place with single distal interlocking screw.  Progressive calcification above the left greater trochanter appears chronic, and there is also progressive spurring along the lesser trochanter which has a chronic appearance.  Mildly progressive spurring of the left femoral head noted. Deformity from the prior intertrochanteric fracture is present.  There are acute fractures of the left superior and inferior pubic rami.  There is a high likelihood of other pelvic fractures, most likely a sacral fracture, based on typical fracture patterns.  Bony demineralization noted.  IMPRESSION: 1.  Acute fractures of the left pubic rami noted.  There is very likely a sacral fracture. 2.  No acute left proximal femoral fracture noted; a gamma nail remains in place.  Original Report Authenticated By: Dellia Cloud, M.D.    Discharge Exam: Please see Progress note.  Disposition: Home with Home Health  Discharge Orders    Future Orders Please Complete By Expires   Diet - low sodium heart healthy       Increase activity slowly      Discharge instructions      Comments:   Narcotic pain medications can cause constipation and so please take the stool softeners daily while you are taking the pain medications. Take Miralax (laxative) if no bowel movement in 2-3 days.     Current Discharge Medication List    START taking these medications   Details  docusate sodium 100 MG CAPS Take 100 mg by mouth 2 (two) times daily. Qty: 60 capsule, Refills: 0    HYDROcodone-acetaminophen (NORCO) 5-325 MG per tablet Take 1-2 tablets by mouth every 6 (six) hours as needed for pain. Qty: 30 tablet, Refills: 0    polyethylene glycol (MIRALAX / GLYCOLAX) packet Take 17 g by mouth daily as needed. Qty: 28 each, Refills: 1      CONTINUE these medications which have NOT CHANGED   Details  calcium carbonate (OS-CAL) 600 MG TABS Take 600 mg by mouth daily.    Cholecalciferol (VITAMIN D3) 1000 UNITS CAPS Take by mouth.    Multiple Vitamins-Minerals (ICAPS) TABS Take 1 tablet by mouth daily.      STOP taking these medications     hydrochlorothiazide (HYDRODIURIL) 25 MG tablet        Follow-up Information    Follow up with Redmond Baseman, MD. Schedule an appointment as soon as possible for a visit in 3 weeks.         Total Discharge Time: 35 mins  [facility]  [facility] Pager [phone]  11/21/2011, 3:37 PM

## 2011-11-21 NOTE — Progress Notes (Signed)
11-21-11 Spoke with Mrs. Armetta at bedside today. Therapy recommends HHC PT/OT/HHaide. Chose [facility]. Made Debbie with [facility] aware of referral. Patient states that she and her husband have 2 rolling walkers. She was interested initially in paying for a 3rd rolling walker out of pocket. After speaking with her husband, she decided that she would not order another rolling walker. DME that is needed is 3 IN 1, wheelchair. Spoke with MD regarding therapy recommendations and dme. Made patient aware that MD will come to discharge this afternoon.  [place], [place] [phone]

## 2011-11-21 NOTE — Progress Notes (Signed)
PCP: Redmond Baseman, MD, MD  Brief HPI:  Gina Mcgee is a 79 y.o. Caucasian female with history of mild hypertension, left-sided breast cancer, and history of concussion with left-sided neuro deficits who presented with fall and pain in left hip. On the day of admission patient was cut off her dog resulting in a fall on to the left side in her home and landed on her left side and had sharp pain. In the emergency department patient had imaging which showed acute fractures of the left pubic rami. Patient attempted to walk but had severe pain and could not bear weight as a result the hospitalist service was asked to admit the patient for further care and management.   Consultants: Phone conversation with Dr. Charlann Boxer  Procedures: None  Subjective: Patient feels better. Pain is not present when she is lying still. Is able to move her left leg to some extent.  Objective: Vital signs in last 24 hours: Temp:  [97.6 F (36.4 C)-99.3 F (37.4 C)] 99.1 F (37.3 C) (03/01 0514) Pulse Rate:  [85-98] 89  (03/01 0514) Resp:  [18-20] 18  (03/01 0514) BP: (109-141)/(58-99) 109/66 mmHg (03/01 0514) SpO2:  [92 %-99 %] 94 % (03/01 0514) Weight:  [50 kg (110 lb 3.7 oz)] 50 kg (110 lb 3.7 oz) (02/28 2237) Weight change:  Last BM Date: 11/20/11  Intake/Output from previous day: 02/28 0701 - 03/01 0700 In: 180 [P.O.:180] Out: -  Intake/Output this shift:    General appearance: alert, cooperative, appears stated age and no distress Resp: clear to auscultation bilaterally Cardio: regular rate and rhythm, S1, S2 normal, no murmur, click, rub or gallop GI: soft, non-tender; bowel sounds normal; no masses,  no organomegaly Extremities: extremities normal, atraumatic, no cyanosis or edema. Limited ROM left hip. Pulses: 2+ and symmetric Skin: Skin color, texture, turgor normal. No rashes or lesions Neurologic: Grossly normal  Lab Results:  Basename 11/21/11 0335 11/20/11 1815  WBC 6.2 9.9    HGB 11.4* 12.3  HCT 34.7* 37.1  PLT 179 231   BMET  Basename 11/21/11 0335 11/20/11 1815  NA 132* 134*  K 3.3* 3.4*  CL 97 97  CO2 26 27  GLUCOSE 103* 109*  BUN 19 17  CREATININE 0.64 0.60  CALCIUM 9.2 9.2  ALT -- --    Studies/Results: Dg Sacrum/coccyx  11/20/2011  *RADIOLOGY REPORT*  Clinical Data: Left hip and pelvic pain, question sacral fracture  SACRUM AND COCCYX - 2+ VIEW  Comparison: None  Findings: Severe osseous demineralization limits exam. SI joints appear symmetric and preserved. Orthopedic hardware proximal left femur. No definite sacrococcygeal fracture identified.  IMPRESSION: Severe osseous demineralization. No definite acute bony abnormalities.  Original Report Authenticated By: Lollie Marrow, M.D.   Dg Hip Complete Left  11/20/2011  *RADIOLOGY REPORT*  Clinical Data: Fall.  Left hip pain.  LEFT HIP - COMPLETE 2+ VIEW  Comparison: 02/10/2010  Findings: Left hip gamma nail remains in place with single distal interlocking screw.  Progressive calcification above the left greater trochanter appears chronic, and there is also progressive spurring along the lesser trochanter which has a chronic appearance.  Mildly progressive spurring of the left femoral head noted. Deformity from the prior intertrochanteric fracture is present.  There are acute fractures of the left superior and inferior pubic rami.  There is a high likelihood of other pelvic fractures, most likely a sacral fracture, based on typical fracture patterns.  Bony demineralization noted.  IMPRESSION: 1.  Acute fractures  of the left pubic rami noted.  There is very likely a sacral fracture. 2.  No acute left proximal femoral fracture noted; a gamma nail remains in place.  Original Report Authenticated By: Dellia Cloud, M.D.    Medications:  Scheduled:   . calcium carbonate  1,250 mg Oral Daily  . cholecalciferol  1,000 Units Oral Daily  . docusate sodium  100 mg Oral BID  . enoxaparin  40 mg  Subcutaneous QHS  . hydrochlorothiazide  25 mg Oral Daily  . multivitamin  1 tablet Oral Daily  . potassium chloride  20 mEq Oral Once  . senna  1 tablet Oral BID  . DISCONTD: ICAPS  1 tablet Oral Daily  . DISCONTD: Vitamin D3  1 capsule Oral Daily    Assessment/Plan:  Principal Problem:  *Pelvic fracture Active Problems:  Hip pain, acute  Fall  HTN (hypertension)  Concussion  Hyponatremia  Hypokalemia    1. Acute hip pain due to fracture of left pubic rami. PT/OT. No surgical indication. Conservative management. Pain control. Bowel regimen to prevent constipation.  2. Mechanical fall/history of concussion (in 1960s) with residual left-sided deficits. Continue management as per physical therapy.   3. Hypertension. Continue hydrochlorothiazide.   4. History of osteoporosis/osteopenia. Continue calcium with vitamin D.   5. Hypokalemia. Replete  6. Mild hyponatremia. Slight worsening today. Patient does not appear to be dehydrated. HCTZ may have played a role. Hold for now.  7. Prophylaxis. Lovenox.   8. CODE STATUS. Full code.   9. Disposition. Patient is very keen on going home versus SNF. I explained to her regarding safety at home and risk of falls. She thinks her husband may be able to help her and her daughter will be coming in on Monday. Husband uses a cane himself and so I am skeptical. I conveyed this to them. Patient will think about this issue after she has been seen by PT.    LOS: 1 day   [facility] Pager [phone] 11/21/2011, 10:14 AM

## 2011-11-21 NOTE — Evaluation (Signed)
Occupational Therapy Evaluation Patient Details Name: Gina Mcgee MRN: [id number] DOB: 25-Nov-1929 Today's Date: 11/21/2011  Problem List:  Patient Active Problem List  Diagnoses  . Hip pain, acute  . Fall  . HTN (hypertension)  . Concussion  . History of breast cancer in female  . Hyponatremia  . Hypokalemia  . Pelvic fracture    Past Medical History:  Past Medical History  Diagnosis Date  . 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 Cancer     left-sided breast cancer  . HTN (hypertension)   . Brain concussion 1960   Past Surgical History:  Past Surgical History  Procedure Date  . Joint replacement   . Femur surgery 2011    left femur pinning  . Bowel resection approx. in 2007    polyp and formed stricture  . Breast lumpectomy 1981    left lumpectomy w/ radiation    OT Assessment/Plan/Recommendation OT Assessment Clinical Impression Statement: Pt currently requires Min A LE ADL's and VC's for safety & sequencing w/ RW, hand placement and for controlled decent during transfers. She would benefit from acute OT followed by HHOT vs SNF rehab depending on progress & spouse ability to assist. Pt reports that her husband is available to provide PRN Assist at discharge, cont to assess. If Pt goes home, she will need 3:1. OT Recommendation/Assessment: Patient will need skilled OT in the acute care venue OT Problem List: Decreased activity tolerance;Impaired balance (sitting and/or standing);Decreased strength;Decreased knowledge of use of DME or AE;Pain Barriers to Discharge: Other (comment) (Unclear if pt husband will be able to assist) OT Therapy Diagnosis : Generalized weakness OT Plan OT Frequency: Min 2X/week OT Treatment/Interventions: Self-care/ADL training;Therapeutic activities;Therapeutic exercise;Energy conservation;DME and/or AE instruction;Patient/family education OT Recommendation Follow Up Recommendations: Home health OT;Skilled nursing  facility;Other (comment) (Depending on pt progress & spouse ability to Assist) Equipment Recommended: 3 in 1 bedside comode OT Goals Acute Rehab OT Goals OT Goal Formulation: With patient Time For Goal Achievement: 2 weeks ADL Goals Pt Will Perform Grooming: with supervision;Standing at sink;Other (comment) (x2-3 tasks) ADL Goal: Grooming - Progress: Goal set today Pt Will Perform Upper Body Bathing: with modified independence;Sitting, chair;Sitting, edge of bed;Sitting at sink ADL Goal: Upper Body Bathing - Progress: Goal set today Pt Will Perform Lower Body Bathing: with min assist;with supervision;Sitting, chair;Sitting, edge of bed;Sitting at sink;with adaptive equipment;Sit to stand from chair;Sit to stand from bed ADL Goal: Lower Body Bathing - Progress: Goal set today Pt Will Perform Upper Body Dressing: with modified independence;Sitting, bed;Sitting, chair ADL Goal: Upper Body Dressing - Progress: Goal set today Pt Will Perform Lower Body Dressing: with min assist;with supervision;Sit to stand from chair;Sit to stand from bed;with adaptive equipment ADL Goal: Lower Body Dressing - Progress: Goal set today Pt Will Transfer to Toilet: with supervision;Ambulation;with DME;3-in-1 ADL Goal: Toilet Transfer - Progress: Goal set today Pt Will Perform Toileting - Clothing Manipulation: with supervision;Sitting on 3-in-1 or toilet;Standing ADL Goal: Toileting - Clothing Manipulation - Progress: Goal set today Pt Will Perform Toileting - Hygiene: with modified independence;Sitting on 3-in-1 or toilet ADL Goal: Toileting - Hygiene - Progress: Goal set today Pt Will Perform Tub/Shower Transfer: Shower transfer;with supervision;with DME;Ambulation;Shower seat with back;Transfer tub bench;Grab bars ADL Goal: Web designer - Progress: Goal set today  OT Evaluation Precautions/Restrictions  Precautions Precautions: Fall Required Braces or Orthoses: No Restrictions Weight Bearing  Restrictions: No Other Position/Activity Restrictions: WBAT Prior Functioning Home Living Lives With: Spouse Receives Help From:  Family Type of Home: House Home Layout: One level Home Access: Level entry Bathroom Shower/Tub: Tub/shower unit;Walk-in shower Bathroom Toilet: Handicapped height Bathroom Accessibility: Yes How Accessible: Accessible via walker;Accessible via wheelchair Home Adaptive Equipment: Built-in shower seat;Grab bars around toilet;Grab bars in shower;Tub transfer bench;Walker - rolling;Straight cane;Reacher Prior Function Level of Independence: Independent with basic ADLs;Independent with homemaking with ambulation;Independent with gait;Independent with transfers Driving: Yes ADL ADL Eating/Feeding: Performed;Modified independent Where Assessed - Eating/Feeding: Edge of bed Grooming: Performed;Wash/dry hands;Wash/dry face;Supervision/safety;Set up Where Assessed - Grooming: Standing at sink;Sitting, bed Upper Body Bathing: Simulated;Modified independent;Set up Where Assessed - Upper Body Bathing: Sitting, bed Lower Body Bathing: Simulated;Minimal assistance;Set up Where Assessed - Lower Body Bathing: Sitting, bed Upper Body Dressing: Performed;Set up Where Assessed - Upper Body Dressing: Sitting, bed Lower Body Dressing: Performed;Minimal assistance Where Assessed - Lower Body Dressing: Sitting, bed Toilet Transfer: Performed;Minimal assistance Toilet Transfer Details (indicate cue type and reason): VC's for safety and sequencing & hand placement on RW. Increased time Toilet Transfer Method: Proofreader: Raised toilet seat with arms (or 3-in-1 over toilet) Toileting - Clothing Manipulation: Performed;Supervision/safety Where Assessed - Toileting Clothing Manipulation: Sit to stand from 3-in-1 or toilet;Standing Toileting - Hygiene: Performed;Modified independent;Supervision/safety Where Assessed - Toileting Hygiene: Sit on 3-in-1 or  toilet;Sit to stand from 3-in-1 or toilet Tub/Shower Transfer: Not assessed Equipment Used: Rolling walker;Other (comment) (3:1 over toilet) ADL Comments: Pt currently requires Min A LE ADL's and VC's for safety & sequencing w/ RW, hand placement and for controlled decent during transfers. She would benefit from acute OT followed by HHOT vs SNF rehab depending on progress & spouse ability to assist. Pt reports that her husband is available to provide PRN Assist at discharge, cont to assess. If Pt goes home, she will need 3:1.Marland Kitchen  Vision/Perception  Vision - History Baseline Vision: Wears glasses all the time Patient Visual Report: No change from baseline Cognition Cognition Arousal/Alertness: Awake/alert Orientation Level: Oriented X4 Sensation/Coordination Sensation Light Touch: Appears Intact Coordination Gross Motor Movements are Fluid and Coordinated: Yes (Pt w/ arthritis bialteral hands) Fine Motor Movements are Fluid and Coordinated: Yes (Pt with arthritis bilateral hands) Extremity Assessment RUE Assessment RUE Assessment: Within Functional Limits LUE Assessment LUE Assessment: Within Functional Limits Mobility  Bed Mobility Bed Mobility: Yes Supine to Sit: 4: Min assist;HOB elevated (Comment degrees) Supine to Sit Details (indicate cue type and reason): Pt elevated HOB to approx 35 degrees, discussed log roll w/ pt since she would not have option at home. Sitting - Scoot to Edge of Bed: 5: Supervision;With rail Sitting - Scoot to Edge of Bed Details (indicate cue type and reason): increased time Sit to Supine: 4: Min assist Sit to Supine - Details (indicate cue type and reason): Lift and lower LLE & guide into bed Transfers Transfers: Yes Sit to Stand: 5: Supervision;4: Min assist;From chair/3-in-1;Without upper extremity assist;From bed Sit to Stand Details (indicate cue type and reason): Min A from bed; Supervision-Min Guard Assist from 3:1 over toilet Stand to Sit: 5:  Supervision;4: Min assist;To chair/3-in-1;To bed;With upper extremity assist Stand to Sit Details: VC's and Supervision-Min assist for controlled decent to bed   End of Session OT - End of Session Equipment Utilized During Treatment: Gait belt;Other (comment) (RW; 3:1 over toilet) Activity Tolerance: Patient tolerated treatment well Patient left: in bed;with call bell in reach Nurse Communication: Other (comment) (Request for pain meds) General Behavior During Session: [facility] for tasks performed Cognition: [facility] for tasks performed   [place], Jeyden Coffelt  Beth Dixon 11/21/2011, 9:47 AM

## 2011-11-22 DIAGNOSIS — I10 Essential (primary) hypertension: Secondary | ICD-10-CM | POA: Diagnosis not present

## 2011-11-22 DIAGNOSIS — Z8781 Personal history of (healed) traumatic fracture: Secondary | ICD-10-CM | POA: Diagnosis not present

## 2011-11-22 DIAGNOSIS — Z853 Personal history of malignant neoplasm of breast: Secondary | ICD-10-CM | POA: Diagnosis not present

## 2011-11-22 DIAGNOSIS — M810 Age-related osteoporosis without current pathological fracture: Secondary | ICD-10-CM | POA: Diagnosis not present

## 2011-11-22 DIAGNOSIS — IMO0001 Reserved for inherently not codable concepts without codable children: Secondary | ICD-10-CM | POA: Diagnosis not present

## 2011-11-23 DIAGNOSIS — IMO0001 Reserved for inherently not codable concepts without codable children: Secondary | ICD-10-CM | POA: Diagnosis not present

## 2011-11-23 DIAGNOSIS — Z853 Personal history of malignant neoplasm of breast: Secondary | ICD-10-CM | POA: Diagnosis not present

## 2011-11-23 DIAGNOSIS — Z8781 Personal history of (healed) traumatic fracture: Secondary | ICD-10-CM | POA: Diagnosis not present

## 2011-11-23 DIAGNOSIS — I10 Essential (primary) hypertension: Secondary | ICD-10-CM | POA: Diagnosis not present

## 2011-11-23 DIAGNOSIS — M810 Age-related osteoporosis without current pathological fracture: Secondary | ICD-10-CM | POA: Diagnosis not present

## 2011-11-24 NOTE — Progress Notes (Signed)
UR completed 

## 2011-11-25 DIAGNOSIS — Z8781 Personal history of (healed) traumatic fracture: Secondary | ICD-10-CM | POA: Diagnosis not present

## 2011-11-25 DIAGNOSIS — I10 Essential (primary) hypertension: Secondary | ICD-10-CM | POA: Diagnosis not present

## 2011-11-25 DIAGNOSIS — IMO0001 Reserved for inherently not codable concepts without codable children: Secondary | ICD-10-CM | POA: Diagnosis not present

## 2011-11-25 DIAGNOSIS — Z853 Personal history of malignant neoplasm of breast: Secondary | ICD-10-CM | POA: Diagnosis not present

## 2011-11-25 DIAGNOSIS — M810 Age-related osteoporosis without current pathological fracture: Secondary | ICD-10-CM | POA: Diagnosis not present

## 2011-11-26 DIAGNOSIS — M810 Age-related osteoporosis without current pathological fracture: Secondary | ICD-10-CM | POA: Diagnosis not present

## 2011-11-26 DIAGNOSIS — Z853 Personal history of malignant neoplasm of breast: Secondary | ICD-10-CM | POA: Diagnosis not present

## 2011-11-26 DIAGNOSIS — I10 Essential (primary) hypertension: Secondary | ICD-10-CM | POA: Diagnosis not present

## 2011-11-26 DIAGNOSIS — Z8781 Personal history of (healed) traumatic fracture: Secondary | ICD-10-CM | POA: Diagnosis not present

## 2011-11-26 DIAGNOSIS — IMO0001 Reserved for inherently not codable concepts without codable children: Secondary | ICD-10-CM | POA: Diagnosis not present

## 2011-11-27 DIAGNOSIS — M810 Age-related osteoporosis without current pathological fracture: Secondary | ICD-10-CM | POA: Diagnosis not present

## 2011-11-27 DIAGNOSIS — Z8781 Personal history of (healed) traumatic fracture: Secondary | ICD-10-CM | POA: Diagnosis not present

## 2011-11-27 DIAGNOSIS — IMO0001 Reserved for inherently not codable concepts without codable children: Secondary | ICD-10-CM | POA: Diagnosis not present

## 2011-11-27 DIAGNOSIS — E876 Hypokalemia: Secondary | ICD-10-CM | POA: Diagnosis not present

## 2011-11-27 DIAGNOSIS — I10 Essential (primary) hypertension: Secondary | ICD-10-CM | POA: Diagnosis not present

## 2011-11-27 DIAGNOSIS — Z853 Personal history of malignant neoplasm of breast: Secondary | ICD-10-CM | POA: Diagnosis not present

## 2011-11-28 DIAGNOSIS — IMO0001 Reserved for inherently not codable concepts without codable children: Secondary | ICD-10-CM | POA: Diagnosis not present

## 2011-11-28 DIAGNOSIS — Z8781 Personal history of (healed) traumatic fracture: Secondary | ICD-10-CM | POA: Diagnosis not present

## 2011-11-28 DIAGNOSIS — Z853 Personal history of malignant neoplasm of breast: Secondary | ICD-10-CM | POA: Diagnosis not present

## 2011-11-28 DIAGNOSIS — M810 Age-related osteoporosis without current pathological fracture: Secondary | ICD-10-CM | POA: Diagnosis not present

## 2011-11-28 DIAGNOSIS — I10 Essential (primary) hypertension: Secondary | ICD-10-CM | POA: Diagnosis not present

## 2011-11-30 DIAGNOSIS — M810 Age-related osteoporosis without current pathological fracture: Secondary | ICD-10-CM | POA: Diagnosis not present

## 2011-11-30 DIAGNOSIS — I10 Essential (primary) hypertension: Secondary | ICD-10-CM | POA: Diagnosis not present

## 2011-11-30 DIAGNOSIS — Z853 Personal history of malignant neoplasm of breast: Secondary | ICD-10-CM | POA: Diagnosis not present

## 2011-11-30 DIAGNOSIS — Z8781 Personal history of (healed) traumatic fracture: Secondary | ICD-10-CM | POA: Diagnosis not present

## 2011-11-30 DIAGNOSIS — IMO0001 Reserved for inherently not codable concepts without codable children: Secondary | ICD-10-CM | POA: Diagnosis not present

## 2011-12-02 DIAGNOSIS — Z853 Personal history of malignant neoplasm of breast: Secondary | ICD-10-CM | POA: Diagnosis not present

## 2011-12-02 DIAGNOSIS — M810 Age-related osteoporosis without current pathological fracture: Secondary | ICD-10-CM | POA: Diagnosis not present

## 2011-12-02 DIAGNOSIS — I10 Essential (primary) hypertension: Secondary | ICD-10-CM | POA: Diagnosis not present

## 2011-12-02 DIAGNOSIS — Z8781 Personal history of (healed) traumatic fracture: Secondary | ICD-10-CM | POA: Diagnosis not present

## 2011-12-02 DIAGNOSIS — IMO0001 Reserved for inherently not codable concepts without codable children: Secondary | ICD-10-CM | POA: Diagnosis not present

## 2011-12-03 DIAGNOSIS — M810 Age-related osteoporosis without current pathological fracture: Secondary | ICD-10-CM | POA: Diagnosis not present

## 2011-12-03 DIAGNOSIS — I10 Essential (primary) hypertension: Secondary | ICD-10-CM | POA: Diagnosis not present

## 2011-12-03 DIAGNOSIS — Z853 Personal history of malignant neoplasm of breast: Secondary | ICD-10-CM | POA: Diagnosis not present

## 2011-12-03 DIAGNOSIS — IMO0001 Reserved for inherently not codable concepts without codable children: Secondary | ICD-10-CM | POA: Diagnosis not present

## 2011-12-03 DIAGNOSIS — Z8781 Personal history of (healed) traumatic fracture: Secondary | ICD-10-CM | POA: Diagnosis not present

## 2011-12-04 DIAGNOSIS — IMO0001 Reserved for inherently not codable concepts without codable children: Secondary | ICD-10-CM | POA: Diagnosis not present

## 2011-12-04 DIAGNOSIS — Z853 Personal history of malignant neoplasm of breast: Secondary | ICD-10-CM | POA: Diagnosis not present

## 2011-12-04 DIAGNOSIS — M810 Age-related osteoporosis without current pathological fracture: Secondary | ICD-10-CM | POA: Diagnosis not present

## 2011-12-04 DIAGNOSIS — Z8781 Personal history of (healed) traumatic fracture: Secondary | ICD-10-CM | POA: Diagnosis not present

## 2011-12-04 DIAGNOSIS — I10 Essential (primary) hypertension: Secondary | ICD-10-CM | POA: Diagnosis not present

## 2011-12-08 DIAGNOSIS — I10 Essential (primary) hypertension: Secondary | ICD-10-CM | POA: Diagnosis not present

## 2011-12-08 DIAGNOSIS — Z853 Personal history of malignant neoplasm of breast: Secondary | ICD-10-CM | POA: Diagnosis not present

## 2011-12-08 DIAGNOSIS — M810 Age-related osteoporosis without current pathological fracture: Secondary | ICD-10-CM | POA: Diagnosis not present

## 2011-12-08 DIAGNOSIS — Z8781 Personal history of (healed) traumatic fracture: Secondary | ICD-10-CM | POA: Diagnosis not present

## 2011-12-08 DIAGNOSIS — IMO0001 Reserved for inherently not codable concepts without codable children: Secondary | ICD-10-CM | POA: Diagnosis not present

## 2011-12-10 DIAGNOSIS — Z8781 Personal history of (healed) traumatic fracture: Secondary | ICD-10-CM | POA: Diagnosis not present

## 2011-12-10 DIAGNOSIS — Z853 Personal history of malignant neoplasm of breast: Secondary | ICD-10-CM | POA: Diagnosis not present

## 2011-12-10 DIAGNOSIS — IMO0001 Reserved for inherently not codable concepts without codable children: Secondary | ICD-10-CM | POA: Diagnosis not present

## 2011-12-10 DIAGNOSIS — M810 Age-related osteoporosis without current pathological fracture: Secondary | ICD-10-CM | POA: Diagnosis not present

## 2011-12-10 DIAGNOSIS — I10 Essential (primary) hypertension: Secondary | ICD-10-CM | POA: Diagnosis not present

## 2011-12-12 DIAGNOSIS — I10 Essential (primary) hypertension: Secondary | ICD-10-CM | POA: Diagnosis not present

## 2011-12-12 DIAGNOSIS — Z853 Personal history of malignant neoplasm of breast: Secondary | ICD-10-CM | POA: Diagnosis not present

## 2011-12-12 DIAGNOSIS — M810 Age-related osteoporosis without current pathological fracture: Secondary | ICD-10-CM | POA: Diagnosis not present

## 2011-12-12 DIAGNOSIS — Z8781 Personal history of (healed) traumatic fracture: Secondary | ICD-10-CM | POA: Diagnosis not present

## 2011-12-12 DIAGNOSIS — IMO0001 Reserved for inherently not codable concepts without codable children: Secondary | ICD-10-CM | POA: Diagnosis not present

## 2011-12-15 DIAGNOSIS — M810 Age-related osteoporosis without current pathological fracture: Secondary | ICD-10-CM | POA: Diagnosis not present

## 2011-12-15 DIAGNOSIS — Z853 Personal history of malignant neoplasm of breast: Secondary | ICD-10-CM | POA: Diagnosis not present

## 2011-12-15 DIAGNOSIS — IMO0001 Reserved for inherently not codable concepts without codable children: Secondary | ICD-10-CM | POA: Diagnosis not present

## 2011-12-15 DIAGNOSIS — Z8781 Personal history of (healed) traumatic fracture: Secondary | ICD-10-CM | POA: Diagnosis not present

## 2011-12-15 DIAGNOSIS — I10 Essential (primary) hypertension: Secondary | ICD-10-CM | POA: Diagnosis not present

## 2011-12-17 DIAGNOSIS — IMO0001 Reserved for inherently not codable concepts without codable children: Secondary | ICD-10-CM | POA: Diagnosis not present

## 2011-12-17 DIAGNOSIS — M810 Age-related osteoporosis without current pathological fracture: Secondary | ICD-10-CM | POA: Diagnosis not present

## 2011-12-17 DIAGNOSIS — I10 Essential (primary) hypertension: Secondary | ICD-10-CM | POA: Diagnosis not present

## 2011-12-17 DIAGNOSIS — Z8781 Personal history of (healed) traumatic fracture: Secondary | ICD-10-CM | POA: Diagnosis not present

## 2011-12-17 DIAGNOSIS — Z853 Personal history of malignant neoplasm of breast: Secondary | ICD-10-CM | POA: Diagnosis not present

## 2011-12-19 DIAGNOSIS — M810 Age-related osteoporosis without current pathological fracture: Secondary | ICD-10-CM | POA: Diagnosis not present

## 2011-12-19 DIAGNOSIS — I10 Essential (primary) hypertension: Secondary | ICD-10-CM | POA: Diagnosis not present

## 2011-12-19 DIAGNOSIS — Z853 Personal history of malignant neoplasm of breast: Secondary | ICD-10-CM | POA: Diagnosis not present

## 2011-12-19 DIAGNOSIS — IMO0001 Reserved for inherently not codable concepts without codable children: Secondary | ICD-10-CM | POA: Diagnosis not present

## 2011-12-19 DIAGNOSIS — Z8781 Personal history of (healed) traumatic fracture: Secondary | ICD-10-CM | POA: Diagnosis not present

## 2011-12-31 DIAGNOSIS — S329XXA Fracture of unspecified parts of lumbosacral spine and pelvis, initial encounter for closed fracture: Secondary | ICD-10-CM | POA: Diagnosis not present

## 2011-12-31 DIAGNOSIS — I10 Essential (primary) hypertension: Secondary | ICD-10-CM | POA: Diagnosis not present

## 2011-12-31 DIAGNOSIS — E785 Hyperlipidemia, unspecified: Secondary | ICD-10-CM | POA: Diagnosis not present

## 2012-01-28 DIAGNOSIS — M810 Age-related osteoporosis without current pathological fracture: Secondary | ICD-10-CM | POA: Diagnosis not present

## 2012-02-10 DIAGNOSIS — H2510 Age-related nuclear cataract, unspecified eye: Secondary | ICD-10-CM | POA: Diagnosis not present

## 2012-02-10 DIAGNOSIS — H5210 Myopia, unspecified eye: Secondary | ICD-10-CM | POA: Diagnosis not present

## 2012-02-10 DIAGNOSIS — H3530 Unspecified macular degeneration: Secondary | ICD-10-CM | POA: Diagnosis not present

## 2012-03-31 DIAGNOSIS — I10 Essential (primary) hypertension: Secondary | ICD-10-CM | POA: Diagnosis not present

## 2012-03-31 DIAGNOSIS — M545 Low back pain, unspecified: Secondary | ICD-10-CM | POA: Diagnosis not present

## 2012-06-23 DIAGNOSIS — Z23 Encounter for immunization: Secondary | ICD-10-CM | POA: Diagnosis not present

## 2012-07-28 DIAGNOSIS — E785 Hyperlipidemia, unspecified: Secondary | ICD-10-CM | POA: Diagnosis not present

## 2012-07-28 DIAGNOSIS — I10 Essential (primary) hypertension: Secondary | ICD-10-CM | POA: Diagnosis not present

## 2012-07-28 DIAGNOSIS — M810 Age-related osteoporosis without current pathological fracture: Secondary | ICD-10-CM | POA: Diagnosis not present

## 2013-01-14 ENCOUNTER — Emergency Department (HOSPITAL_COMMUNITY): Payer: Medicare Other

## 2013-01-14 ENCOUNTER — Telehealth: Payer: Self-pay | Admitting: Family Medicine

## 2013-01-14 ENCOUNTER — Ambulatory Visit
Admission: RE | Admit: 2013-01-14 | Discharge: 2013-01-14 | Disposition: A | Discharge status: Home / Self Care | Payer: Medicare Other | Source: Ambulatory Visit | Attending: Family Medicine | Admitting: Family Medicine

## 2013-01-14 ENCOUNTER — Encounter (HOSPITAL_COMMUNITY): Payer: Self-pay | Admitting: Emergency Medicine

## 2013-01-14 ENCOUNTER — Emergency Department (HOSPITAL_COMMUNITY)
Admission: EM | Admit: 2013-01-14 | Discharge: 2013-01-14 | Disposition: A | Discharge status: Home / Self Care | Payer: Medicare Other | Attending: Emergency Medicine | Admitting: Emergency Medicine

## 2013-01-14 DIAGNOSIS — Y929 Unspecified place or not applicable: Secondary | ICD-10-CM | POA: Insufficient documentation

## 2013-01-14 DIAGNOSIS — S42409A Unspecified fracture of lower end of unspecified humerus, initial encounter for closed fracture: Secondary | ICD-10-CM | POA: Diagnosis not present

## 2013-01-14 DIAGNOSIS — Z87820 Personal history of traumatic brain injury: Secondary | ICD-10-CM | POA: Diagnosis not present

## 2013-01-14 DIAGNOSIS — I10 Essential (primary) hypertension: Secondary | ICD-10-CM | POA: Diagnosis not present

## 2013-01-14 DIAGNOSIS — M25522 Pain in left elbow: Secondary | ICD-10-CM

## 2013-01-14 DIAGNOSIS — S42402A Unspecified fracture of lower end of left humerus, initial encounter for closed fracture: Secondary | ICD-10-CM

## 2013-01-14 DIAGNOSIS — W010XXA Fall on same level from slipping, tripping and stumbling without subsequent striking against object, initial encounter: Secondary | ICD-10-CM | POA: Insufficient documentation

## 2013-01-14 DIAGNOSIS — Z853 Personal history of malignant neoplasm of breast: Secondary | ICD-10-CM | POA: Diagnosis not present

## 2013-01-14 DIAGNOSIS — Y939 Activity, unspecified: Secondary | ICD-10-CM | POA: Insufficient documentation

## 2013-01-14 DIAGNOSIS — Z79899 Other long term (current) drug therapy: Secondary | ICD-10-CM | POA: Insufficient documentation

## 2013-01-14 DIAGNOSIS — S42453A Displaced fracture of lateral condyle of unspecified humerus, initial encounter for closed fracture: Secondary | ICD-10-CM | POA: Diagnosis not present

## 2013-01-14 DIAGNOSIS — M25429 Effusion, unspecified elbow: Secondary | ICD-10-CM | POA: Insufficient documentation

## 2013-01-14 DIAGNOSIS — S42463A Displaced fracture of medial condyle of unspecified humerus, initial encounter for closed fracture: Secondary | ICD-10-CM | POA: Diagnosis not present

## 2013-01-14 MED ORDER — HYDROCODONE-ACETAMINOPHEN 5-325 MG PO TABS: 1.0000 | ORAL_TABLET | Freq: Four times a day (QID) | ORAL | Status: DC | PRN

## 2013-01-14 NOTE — ED Provider Notes (Signed)
History    CSN: [id number] Arrival date & time 01/14/13  1559 First MD Initiated Contact with Patient 01/14/13 1629      Chief Complaint  Patient presents with  . Elbow Injury    HPI Patient tripped and fell this morning injuring her left elbow. Patient did not lose consciousness. She did not hit her head or neck. She landed on her left side. Patient has noticed swelling in the left elbow. She's had pain with movement. Patient went to see her primary care Dr. and had an outpatient x-ray that showed a fracture of the left elbow. She was instructed to come to the emergency room for evaluation. The pain is mild to moderate. It increases with movement. Patient has no other complaints.  Past Medical History  Diagnosis Date  . 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 Cancer     left-sided breast cancer  . HTN (hypertension)   . Brain concussion 1960    Past Surgical History  Procedure Laterality Date  . Joint replacement    . Femur surgery  2011    left femur pinning  . Bowel resection  approx. in 2007    polyp and formed stricture  . Breast lumpectomy  1981    left lumpectomy w/ radiation    Family History  Problem Relation Age of Onset  . Stroke Mother   . Lung cancer Father   . Stroke Father     History  Substance Use Topics  . Smoking status: Never Smoker   . Smokeless tobacco: Never Used  . Alcohol Use: No     Comment: 1 beer 2-3 weeks.    OB History   Grav Para Term Preterm Abortions TAB SAB Ect Mult Living                  Review of Systems  All other systems reviewed and are negative.    Allergies  Aspirin; Fish allergy; Nsaids; Aloe; and Sulfa antibiotics  Home Medications   Current Outpatient Rx  Name  Route  Sig  Dispense  Refill  . calcium carbonate (OS-CAL) 600 MG TABS   Oral   Take 600 mg by mouth daily.         . Cholecalciferol (VITAMIN D3) 1000 UNITS CAPS   Oral   Take by mouth.         Marland Kitchen ibuprofen (ADVIL,MOTRIN)  200 MG tablet   Oral   Take 200 mg by mouth every 6 (six) hours as needed for pain.         . Multiple Vitamins-Minerals (ICAPS) TABS   Oral   Take 1 tablet by mouth daily.         Marland Kitchen triamterene-hydrochlorothiazide (DYAZIDE) 37.5-25 MG per capsule   Oral   Take 1 capsule by mouth every morning.         Marland Kitchen HYDROcodone-acetaminophen (NORCO) 5-325 MG per tablet   Oral   Take 1-2 tablets by mouth every 6 (six) hours as needed for pain.   20 tablet   0     BP 137/104  Pulse 92  Temp(Src) 98.8 F (37.1 C) (Oral)  Resp 20  Physical Exam  Nursing note and vitals reviewed. Constitutional: She appears well-developed and well-nourished. No distress.  HENT:  Head: Normocephalic and atraumatic.  Right Ear: External ear normal.  Left Ear: External ear normal.  Eyes: Conjunctivae are normal. Right eye exhibits no discharge. Left eye exhibits no discharge. No scleral icterus.  Neck: Neck supple. No tracheal deviation present.  Cardiovascular: Normal rate, regular rhythm and intact distal pulses.   Pulmonary/Chest: Effort normal and breath sounds normal. No stridor. No respiratory distress. She has no wheezes. She has no rales.  Abdominal: Soft. Bowel sounds are normal. She exhibits no distension. There is no tenderness. There is no rebound and no guarding.  Musculoskeletal: She exhibits edema and tenderness.       Left elbow: She exhibits swelling, effusion and deformity. She exhibits no laceration. Tenderness found.  Neurological: She is alert. She has normal strength. No sensory deficit. Cranial nerve deficit:  no gross defecits noted. She exhibits normal muscle tone. She displays no seizure activity. Coordination normal.  Skin: Skin is warm and dry. No rash noted.  Psychiatric: She has a normal mood and affect.    ED Course  Procedures (including critical care time)  Labs Reviewed - No data to display Dg Elbow 2 Views Left  01/14/2013  *RADIOLOGY REPORT*  Clinical Data: Fall   LEFT ELBOW - 2 VIEW  Comparison: None.  Findings: There is a vertical fracture through the lateral epicondyle. Mildly comminuted fracture of the medial condyle.  The lateral epicondyle fracture enters the articular surface. No evidence of joint dislocation.  IMPRESSION: Intra-articular fracture of the medial and lateral epicondyle.   Original Report Authenticated By: Genevive Bi, M.D.    Ct Elbow Left W/o Cm  01/14/2013  *RADIOLOGY REPORT*  Clinical Data: Distal humerus fracture.  CT OF THE LEFT ELBOW WITHOUT CONTRAST  Technique:  Multidetector CT imaging was performed according to the standard protocol. Multiplanar CT image reconstructions were also generated.  Comparison: Radiographs dated 01/14/2013  Findings: There is a T-shaped fracture through the distal humerus. The fracture is essentially along the old epiphyseal plate. There is slight impaction and slight lateral displacement of the distal fragment.  There is also a longitudinal fracture at the medial aspect of the capitellum with no displacement.  The proximal radius and ulna are normal.  IMPRESSION: T-shaped fracture of the distal humerus with slight impaction and slight displacement.   Original Report Authenticated By: Francene Boyers, M.D.      1. Elbow fracture, left, closed, initial encounter     MDM   Pt was seen by Dr Ranell Patrick in the ED.  CT scan was ordered to facilitate outpatient treatment.  Pt was splinted and placed in a sling.  Discharged with prescriptions.       Celene Kras, MD 01/14/13 2012

## 2013-01-14 NOTE — Progress Notes (Signed)
Orthopedic Tech Progress Note Patient Details:  Gina Mcgee Jun 22, 1930 [id number] Applied long arm splint to LUE, applied arm sling. Ortho Devices Type of Ortho Device: Sling immobilizer;Post (long arm) splint Ortho Device/Splint Interventions: Application   Lesle Chris 01/14/2013, 9:10 PM

## 2013-01-14 NOTE — Telephone Encounter (Signed)
[name] and struck left elbow on wood floor this morning.  She placed ice on it but it is still swollen.  She has full ROM but the area is very sore.    I am unable to schedule an appointment for her today.  I suggested she go to the ER for evaluation but she isn't interested in doing that.  She wants to know if we can order an xray at an imaging facility in [place].  I told her I wasn't sure if protocol would allow [place] to do that but that I would check with Dr. Modesto Charon and we would call her back.

## 2013-01-14 NOTE — Telephone Encounter (Signed)
Spoke with pt order faxed to [facility] fax [phone] per Dr Modesto Charon request

## 2013-01-14 NOTE — ED Notes (Signed)
Pt presenting to ed with c/o fracture to left elbow pt states she had xray done at [facility] and was told to present to ed by pcp. Pt with obvious swelling and deformity noted to elbow. Pt states she fell this morning pt denies loc at this time.

## 2013-01-17 DIAGNOSIS — S52599A Other fractures of lower end of unspecified radius, initial encounter for closed fracture: Secondary | ICD-10-CM | POA: Diagnosis not present

## 2013-01-18 ENCOUNTER — Encounter: Payer: Self-pay | Admitting: Family Medicine

## 2013-01-18 ENCOUNTER — Ambulatory Visit (INDEPENDENT_AMBULATORY_CARE_PROVIDER_SITE_OTHER): Payer: Medicare Other

## 2013-01-18 ENCOUNTER — Ambulatory Visit (INDEPENDENT_AMBULATORY_CARE_PROVIDER_SITE_OTHER): Payer: Medicare Other | Admitting: Family Medicine

## 2013-01-18 VITALS — BP 120/82 | HR 92 | Temp 97.6°F | Wt 109.8 lb

## 2013-01-18 DIAGNOSIS — Z01818 Encounter for other preprocedural examination: Secondary | ICD-10-CM

## 2013-01-18 DIAGNOSIS — Z853 Personal history of malignant neoplasm of breast: Secondary | ICD-10-CM | POA: Diagnosis not present

## 2013-01-18 DIAGNOSIS — Z419 Encounter for procedure for purposes other than remedying health state, unspecified: Secondary | ICD-10-CM

## 2013-01-18 DIAGNOSIS — I10 Essential (primary) hypertension: Secondary | ICD-10-CM

## 2013-01-18 LAB — POCT CBC
Granulocyte percent: 82.3 %G — AB (ref 37–80)
HCT, POC: 38.2 % (ref 37.7–47.9)
Hemoglobin: 12.7 g/dL (ref 12.2–16.2)
Lymph, poc: 1.3 (ref 0.6–3.4)
MCH, POC: 29.4 pg (ref 27–31.2)
MCHC: 33.3 g/dL (ref 31.8–35.4)
MCV: 88.5 fL (ref 80–97)
MPV: 7.9 fL (ref 0–99.8)
POC Granulocyte: 8 — AB (ref 2–6.9)
POC LYMPH PERCENT: 13.7 %L (ref 10–50)
Platelet Count, POC: 291 10*3/uL (ref 142–424)
RBC: 4.3 M/uL (ref 4.04–5.48)
RDW, POC: 13.4 %
WBC: 9.7 10*3/uL (ref 4.6–10.2)

## 2013-01-18 LAB — BASIC METABOLIC PANEL WITH GFR
BUN: 14 mg/dL (ref 6–23)
CO2: 27 mEq/L (ref 19–32)
Calcium: 10.2 mg/dL (ref 8.4–10.5)
Chloride: 96 mEq/L (ref 96–112)
Creat: 0.71 mg/dL (ref 0.50–1.10)
GFR, Est African American: >89 mL/min
GFR, Est Non African American: 81 mL/min
Glucose, Bld: 107 mg/dL — ABNORMAL HIGH (ref 70–99)
Potassium: 4.2 mEq/L (ref 3.5–5.3)
Sodium: 135 mEq/L (ref 135–145)

## 2013-01-18 NOTE — Progress Notes (Signed)
Quick Note:  Call patient. Labs normal. No change in plan. Cleared for orthopedic surgery. ______

## 2013-01-18 NOTE — Progress Notes (Signed)
Patient ID: Gina Mcgee, female   DOB: September 11, 1930, 79 y.o.   MRN: [id number] SUBJECTIVE: HPI: Here for  Pre-op clearance for ORIF of the left elbow. Patient fell and sustained a bi-epicondylar fracture.   Past Medical History  Diagnosis Date  . 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 Cancer     left-sided breast cancer  . HTN (hypertension)   . Brain concussion 1960   Past Surgical History  Procedure Laterality Date  . Joint replacement    . Femur surgery  2011    left femur pinning  . Bowel resection  approx. in 2007    polyp and formed stricture  . Breast lumpectomy  1981    left lumpectomy w/ radiation   History   Social History  . Marital Status: Married    Spouse Name: N/A    Number of Children: N/A  . Years of Education: N/A   Occupational History  . Not on file.   Social History Main Topics  . Smoking status: Never Smoker   . Smokeless tobacco: Never Used  . Alcohol Use: No     Comment: 1 beer 2-3 weeks.  . Drug Use: No  . Sexually Active: Not on file   Other Topics Concern  . Not on file   Social History Narrative  . No narrative on file   Family History  Problem Relation Age of Onset  . Stroke Mother   . Lung cancer Father   . Stroke Father    Current Outpatient Prescriptions on File Prior to Visit  Medication Sig Dispense Refill  . calcium carbonate (OS-CAL) 600 MG TABS Take 600 mg by mouth daily.      . Cholecalciferol (VITAMIN D3) 1000 UNITS CAPS Take by mouth.      Marland Kitchen HYDROcodone-acetaminophen (NORCO) 5-325 MG per tablet Take 1-2 tablets by mouth every 6 (six) hours as needed for pain.  20 tablet  0  . ibuprofen (ADVIL,MOTRIN) 200 MG tablet Take 200 mg by mouth every 6 (six) hours as needed for pain.      . Multiple Vitamins-Minerals (ICAPS) TABS Take 1 tablet by mouth daily.      Marland Kitchen triamterene-hydrochlorothiazide (DYAZIDE) 37.5-25 MG per capsule Take 1 capsule by mouth every morning.       No current facility-administered  medications on file prior to visit.   Allergies  Allergen Reactions  . Aspirin Other (See Comments)    Gets jitter with large doses  . Fish Allergy Nausea Only and Other (See Comments)    "makes me jumpy and twitchy, but grand children are allergic"   . Nsaids     Can take for short periods of time. Naprosyn -feels dazed  . Aloe Rash  . Sulfa Antibiotics Rash    There is no immunization history on file for this patient. Prior to Admission medications   Medication Sig Start Date End Date Taking? Authorizing Provider  calcium carbonate (OS-CAL) 600 MG TABS Take 600 mg by mouth daily.    Historical Provider, MD  Cholecalciferol (VITAMIN D3) 1000 UNITS CAPS Take by mouth.    Historical Provider, MD  HYDROcodone-acetaminophen (NORCO) 5-325 MG per tablet Take 1-2 tablets by mouth every 6 (six) hours as needed for pain. 01/14/13   Celene Kras, MD  ibuprofen (ADVIL,MOTRIN) 200 MG tablet Take 200 mg by mouth every 6 (six) hours as needed for pain.    Historical Provider, MD  Multiple Vitamins-Minerals (ICAPS) TABS Take 1 tablet  by mouth daily.    Historical Provider, MD  triamterene-hydrochlorothiazide (DYAZIDE) 37.5-25 MG per capsule Take 1 capsule by mouth every morning.    Historical Provider, MD    ROS: As above in the HPI. All other systems are stable or negative.  OBJECTIVE: APPEARANCE:  Pleasant frail White female with the left elbow in a cast/splint and sling. Kyphoscoliosis Patient in no acute distress.The patient appeared well nourished and normally developed. Acyanotic. Waist: VITAL SIGNS:BP 120/82  Pulse 92  Temp(Src) 97.6 F (36.4 C) (Oral)  Wt 109 lb 12.8 oz (49.805 kg)  BMI 21.44 kg/m2  SpO2 97%   SKIN: warm and  Dry without overt rashes, tattoos and scars  HEAD and Neck: without JVD, Head and scalp: normal Eyes:No scleral icterus. Fundi normal, eye movements normal. Ears: Auricle normal, canal normal, Tympanic membranes normal, insufflation normal. Nose:  normal Throat: normal Neck & thyroid: normal  CHEST & LUNGS: Chest wall: kyphotic  Lungs: Clear  CVS: Reveals the PMI to be normally located. Regular rhythm, First and Second Heart sounds are normal,  absence of murmurs, rubs or gallops. Peripheral vasculature: Radial pulses: normal Dorsal pedis pulses: normal Posterior pulses: normal  ABDOMEN:  Appearance: normal Benign,, no organomegaly, no masses, no Abdominal Aortic enlargement. No Guarding , no rebound. No Bruits. Bowel sounds: normal   EXTREMETIES: nonedematous.   MUSCULOSKELETAL:  Spine:kyphoscoliosis Joints:left elbow in splint/sling  NEUROLOGIC: oriented to time,place and person; nonfocal.  ASSESSMENT: Surgery, elective - Plan: EKG 12-Lead, DG Chest 2 View, POCT CBC, BASIC METABOLIC PANEL WITH GFR  History of breast cancer in female  HTN (hypertension)   Osteoporosis  PLAN: Orders Placed This Encounter  Procedures  . DG Chest 2 View    Standing Status: Future     Number of Occurrences: 1     Standing Expiration Date: 03/20/2014    Order Specific Question:  Reason for Exam (SYMPTOM  OR DIAGNOSIS REQUIRED)    Answer:  pre surgical    Order Specific Question:  Preferred imaging location?    Answer:  Internal  . BASIC METABOLIC PANEL WITH GFR  . POCT CBC  . EKG 12-Lead   No orders of the defined types were placed in this encounter.   [facility] reading (PRIMARY) by  Dr.Ellington Greenslade Modesto Charon: Preliminary: chronic  Changes, kyphoscoliosis. No acute cardiopulmonary findings.  EKG 12 lead . No acute findings.         Naquisha Whitehair P. Modesto Charon, M.D.

## 2013-01-19 ENCOUNTER — Other Ambulatory Visit: Payer: Self-pay | Admitting: Orthopedic Surgery

## 2013-01-19 ENCOUNTER — Telehealth: Payer: Self-pay | Admitting: *Deleted

## 2013-01-19 DIAGNOSIS — R0989 Other specified symptoms and signs involving the circulatory and respiratory systems: Secondary | ICD-10-CM

## 2013-01-19 DIAGNOSIS — I279 Pulmonary heart disease, unspecified: Secondary | ICD-10-CM

## 2013-01-19 NOTE — Telephone Encounter (Signed)
Dr. Modesto Charon wants cardiology referral prioe to surgery- Where does patient want to go?

## 2013-01-19 NOTE — Telephone Encounter (Signed)
Send copy of this note to Dr. Modesto Charon. Also send copy to Dr. Amanda Pea Since the surgery his own her arm she may not need a cardiac referral, but I don't know her current cardiac condition

## 2013-01-19 NOTE — Telephone Encounter (Signed)
Patient's husband notified of results and pending referral.

## 2013-01-19 NOTE — Telephone Encounter (Signed)
Dr Amanda Pea and Dr Modesto Charon to address

## 2013-01-19 NOTE — Telephone Encounter (Signed)
[place] RAD[facility] CALL REPORT THIS AM: CXR FOR PRE-OP (DR WONG PT) HYPEREXPANDED LUNGS WITH ACUTE CARDIOPULMONARY DISEASE DIFFUSE OSTEOPENIA WITH MULTIPLE MODERATE TO SEVERE COMPRESSION DEFORMITIES THROUGHOUT MID AND LOWER THORACIC SPINE.  OPERATION ON ARM IS SCHEDULED FOR 01/25/13 WITH DR Amanda Pea. ALL NOTES IN [facility].  DO YOU THINK THIS PT NEEDS CARDIOLOGY APPT- BEFORE SURG? DOESN'T LOOK TO HAVE CARDIOLOGIST.

## 2013-01-19 NOTE — Telephone Encounter (Signed)
Patient needs to have surgery within 1 week. Please get a Cardiac  Consultation in the next 1 to 2 days. Saw the report enroute to [place]. Please let patient know as well. Sorry I am not there to call patient. Let Dr Amanda Pea know as well. I had cleared patient  yesterday for surgery based on my preliminary reading. Thanks.

## 2013-01-19 NOTE — Telephone Encounter (Signed)
Make sure patient has an EKG prior to surgery- Let [facility] know

## 2013-01-20 ENCOUNTER — Ambulatory Visit (INDEPENDENT_AMBULATORY_CARE_PROVIDER_SITE_OTHER): Payer: Medicare Other | Admitting: Cardiovascular Disease

## 2013-01-20 ENCOUNTER — Encounter (HOSPITAL_COMMUNITY): Payer: Self-pay | Admitting: Pharmacy Technician

## 2013-01-20 ENCOUNTER — Inpatient Hospital Stay (HOSPITAL_COMMUNITY)
Admission: RE | Admit: 2013-01-20 | Discharge: 2013-01-20 | Disposition: A | Discharge status: Home / Self Care | Payer: Medicare Other | Source: Ambulatory Visit

## 2013-01-20 VITALS — BP 139/85 | HR 99 | Wt 109.0 lb

## 2013-01-20 DIAGNOSIS — R918 Other nonspecific abnormal finding of lung field: Secondary | ICD-10-CM

## 2013-01-20 DIAGNOSIS — R9389 Abnormal findings on diagnostic imaging of other specified body structures: Secondary | ICD-10-CM | POA: Insufficient documentation

## 2013-01-20 DIAGNOSIS — Z01810 Encounter for preprocedural cardiovascular examination: Secondary | ICD-10-CM | POA: Insufficient documentation

## 2013-01-20 DIAGNOSIS — I10 Essential (primary) hypertension: Secondary | ICD-10-CM

## 2013-01-20 HISTORY — DX: Encounter for preprocedural cardiovascular examination: Z01.810

## 2013-01-20 NOTE — Assessment & Plan Note (Signed)
Asymptomatic with normal exam and ECG  Clear to have orthopedic surgery

## 2013-01-20 NOTE — Progress Notes (Signed)
Patient ID: Gina Mcgee, female   DOB: [birth year], 79 y.o.   MRN: [id number] 79 yo referred by Dr Modesto Charon for preop clearence.  She has no cardiac history [name] and broke her elbow and needs surgery next Tuesday with Dr Amanda Pea. CXR reported as abnormal  IMPRESSION: 1. Hyperexpanded lungs with acute cardiopulmonary disease. 2. Diffuse osteopenia with development of multiple moderate to severe compression deformities throughout the mid and lower thoracic spine. Correlation for point tenderness at these locations is recommended.  She is a nonsmoker and has no clinical dyspnea. Had some radiation for breast CA and indicated some "scarring in my lung" No cough sputum or wheezing. No chest pain or cardiac history and has normal ECG.  She had uncomplicated surgery 2 years ago with general anesthesia. No bleeding diathesis.  Active with no fatigue PND orthopnea palpitations or syncope  ROS: Denies fever, malais, weight loss, blurry vision, decreased visual acuity, cough, sputum, SOB, hemoptysis, pleuritic pain, palpitaitons, heartburn, abdominal pain, melena, lower extremity edema, claudication, or rash.  All other systems reviewed and negative   General: Affect appropriate Thin elderly female HEENT: normal Neck supple with no adenopathy JVP normal no bruits no thyromegaly Lungs clear with no wheezing and good diaphragmatic motion Heart:  S1/S2 no murmur,rub, gallop or click PMI normal Abdomen: benighn, BS positve, no tenderness, no AAA no bruit.  No HSM or HJR Distal pulses intact with no bruits No edema Neuro non-focal Skin warm and dry No muscular weakness Left elbow in sling  Medications Current Outpatient Prescriptions  Medication Sig Dispense Refill  . beta carotene w/minerals (OCUVITE) tablet Take 1 tablet by mouth daily.      . calcium carbonate (OS-CAL) 600 MG TABS Take 600 mg by mouth daily.      . Cholecalciferol (VITAMIN D3) 1000 UNITS CAPS Take by mouth.      Marland Kitchen  HYDROcodone-acetaminophen (NORCO) 5-325 MG per tablet Take 1-2 tablets by mouth every 6 (six) hours as needed for pain.  20 tablet  0  . ibuprofen (ADVIL,MOTRIN) 200 MG tablet Take 200 mg by mouth every 6 (six) hours as needed for pain.      Marland Kitchen triamterene-hydrochlorothiazide (DYAZIDE) 37.5-25 MG per capsule Take 1 capsule by mouth every morning.       No current facility-administered medications for this visit.    Allergies Aspirin; Fish allergy; Nsaids; Aloe; and Sulfa antibiotics  Family History: Family History  Problem Relation Age of Onset  . Stroke Mother   . Lung cancer Father   . Stroke Father     Social History: History   Social History  . Marital Status: Married    Spouse Name: N/A    Number of Children: N/A  . Years of Education: N/A   Occupational History  . Not on file.   Social History Main Topics  . Smoking status: Never Smoker   . Smokeless tobacco: Never Used  . Alcohol Use: No     Comment: 1 beer 2-3 weeks.  . Drug Use: No  . Sexually Active: Not on file   Other Topics Concern  . Not on file   Social History Narrative  . No narrative on file    Electrocardiogram: 4/29 NSR normal ECG  Assessment and Plan

## 2013-01-20 NOTE — Patient Instructions (Signed)
Your physician recommends that you schedule a follow-up appointment in: AS NEEDED  Your physician recommends that you continue on your current medications as directed. Please refer to the Current Medication list given to you today.  

## 2013-01-20 NOTE — Telephone Encounter (Signed)
Referral to [employer] done.  Patient will see them on 01/20/13 at 10:15.  Patient aware.  She is scheduled at [facility] for preoperative workup at 10:00 but will contact them to reschedule.

## 2013-01-20 NOTE — Assessment & Plan Note (Signed)
No clinical COPD Aortic findings not clinically significant

## 2013-01-20 NOTE — Assessment & Plan Note (Signed)
Well controlled.  Continue current medications and low sodium Dash type diet.    

## 2013-01-24 ENCOUNTER — Encounter (HOSPITAL_COMMUNITY)
Admission: RE | Admit: 2013-01-24 | Discharge: 2013-01-24 | Disposition: A | Discharge status: Home / Self Care | Payer: Medicare Other | Source: Ambulatory Visit | Attending: Orthopedic Surgery | Admitting: Orthopedic Surgery

## 2013-01-24 ENCOUNTER — Encounter (HOSPITAL_COMMUNITY): Payer: Self-pay

## 2013-01-24 DIAGNOSIS — IMO0002 Reserved for concepts with insufficient information to code with codable children: Secondary | ICD-10-CM | POA: Diagnosis not present

## 2013-01-24 DIAGNOSIS — I10 Essential (primary) hypertension: Secondary | ICD-10-CM | POA: Diagnosis not present

## 2013-01-24 DIAGNOSIS — D62 Acute posthemorrhagic anemia: Secondary | ICD-10-CM | POA: Diagnosis not present

## 2013-01-24 DIAGNOSIS — R918 Other nonspecific abnormal finding of lung field: Secondary | ICD-10-CM | POA: Diagnosis not present

## 2013-01-24 DIAGNOSIS — M810 Age-related osteoporosis without current pathological fracture: Secondary | ICD-10-CM | POA: Diagnosis not present

## 2013-01-24 DIAGNOSIS — Z9010 Acquired absence of unspecified breast and nipple: Secondary | ICD-10-CM | POA: Diagnosis not present

## 2013-01-24 DIAGNOSIS — Z853 Personal history of malignant neoplasm of breast: Secondary | ICD-10-CM | POA: Diagnosis not present

## 2013-01-24 LAB — BASIC METABOLIC PANEL
BUN: 17 mg/dL (ref 6–23)
CO2: 31 mEq/L (ref 19–32)
Calcium: 10.1 mg/dL (ref 8.4–10.5)
Chloride: 97 mEq/L (ref 96–112)
Creatinine, Ser: 0.81 mg/dL (ref 0.50–1.10)
GFR calc Af Amer: 77 mL/min — ABNORMAL LOW (ref >90)

## 2013-01-24 LAB — CBC
HCT: 36.7 % (ref 36.0–46.0)
MCH: 29.9 pg (ref 26.0–34.0)
MCV: 88.4 fL (ref 78.0–100.0)
Platelets: 374 10*3/uL (ref 150–400)
RDW: 13 % (ref 11.5–15.5)

## 2013-01-24 MED ORDER — SODIUM CHLORIDE 0.45 % IV SOLN: INTRAVENOUS | Status: DC

## 2013-01-24 MED ORDER — CEFAZOLIN SODIUM-DEXTROSE 2-3 GM-% IV SOLR
2.0000 g | INTRAVENOUS | Status: AC
  Administered 2013-01-25: 2 g via INTRAVENOUS
  Filled 2013-01-24: qty 50

## 2013-01-24 NOTE — Pre-Procedure Instructions (Signed)
Gina Mcgee  01/24/2013   Your procedure is scheduled on: Tuesday, May 6,2014  Report to [facility] Elevators 3rd Floor  Call this number [phone] at 8:00 AM the morning of surgery for time of arrival   Remember:   Do not eat food or drink liquids after midnight.   Take these medicines the morning of surgery with A SIP OF WATER: If needed:  HYDROcodone-acetaminophen (NORCO) 5-325 MG per tablet     Do not wear jewelry, make-up or nail polish.  Do not wear lotions, powders, or perfumes.  Do not shave 48 hours prior to surgery.   Do not bring valuables to the hospital.  Contacts, dentures or bridgework may not be worn into surgery.  Leave suitcase in the car. After surgery it may be brought to your room.  For patients admitted to the hospital, checkout time is 11:00 AM the day of discharge.   Patients discharged the day of surgery will not be allowed to drive home.  Name and phone number of your driver:  Family/Friend  Special Instructions: Shower using CHG 2 nights before surgery and the night before surgery.  If you shower the day of surgery use CHG.  Use special wash - you have one bottle of CHG for all showers.  You should use approximately 1/3 of the bottle for each shower.   Please read over the following fact sheets that you were given: Pain Booklet, Coughing and Deep Breathing and Surgical Site Infection Prevention

## 2013-01-24 NOTE — Progress Notes (Signed)
Patient informed Nurse that she had an appointment with Dr. Eden Emms on May 1st, but he did not order her to have a stress test, but he did review her chest xray and thought it was fine. Patient denied having a cardiac cath or sleep study. Patient instructed to call in the morning at 0800 with time of arrival for scheduled surgery tomorrow. Patient verbalized understanding.

## 2013-01-25 ENCOUNTER — Encounter (HOSPITAL_COMMUNITY)
Admission: RE | Disposition: A | Discharge status: Home Health | Payer: Self-pay | Source: Ambulatory Visit | Attending: Orthopedic Surgery

## 2013-01-25 ENCOUNTER — Encounter (HOSPITAL_COMMUNITY): Payer: Self-pay | Admitting: Vascular Surgery

## 2013-01-25 ENCOUNTER — Encounter (HOSPITAL_COMMUNITY): Payer: Self-pay | Admitting: *Deleted

## 2013-01-25 ENCOUNTER — Observation Stay (HOSPITAL_COMMUNITY)
Admission: RE | Admit: 2013-01-25 | Discharge: 2013-01-28 | Disposition: A | Discharge status: Home Health | Payer: Medicare Other | Source: Ambulatory Visit | Attending: Orthopedic Surgery | Admitting: Orthopedic Surgery

## 2013-01-25 ENCOUNTER — Ambulatory Visit (HOSPITAL_COMMUNITY): Payer: Medicare Other | Admitting: Anesthesiology

## 2013-01-25 DIAGNOSIS — I10 Essential (primary) hypertension: Secondary | ICD-10-CM | POA: Insufficient documentation

## 2013-01-25 DIAGNOSIS — Z853 Personal history of malignant neoplasm of breast: Secondary | ICD-10-CM | POA: Insufficient documentation

## 2013-01-25 DIAGNOSIS — R918 Other nonspecific abnormal finding of lung field: Secondary | ICD-10-CM | POA: Insufficient documentation

## 2013-01-25 DIAGNOSIS — D62 Acute posthemorrhagic anemia: Secondary | ICD-10-CM | POA: Insufficient documentation

## 2013-01-25 DIAGNOSIS — Z9010 Acquired absence of unspecified breast and nipple: Secondary | ICD-10-CM | POA: Insufficient documentation

## 2013-01-25 DIAGNOSIS — IMO0002 Reserved for concepts with insufficient information to code with codable children: Secondary | ICD-10-CM | POA: Diagnosis not present

## 2013-01-25 DIAGNOSIS — S42493A Other displaced fracture of lower end of unspecified humerus, initial encounter for closed fracture: Secondary | ICD-10-CM | POA: Diagnosis not present

## 2013-01-25 DIAGNOSIS — Y92009 Unspecified place in unspecified non-institutional (private) residence as the place of occurrence of the external cause: Secondary | ICD-10-CM | POA: Insufficient documentation

## 2013-01-25 DIAGNOSIS — W19XXXA Unspecified fall, initial encounter: Secondary | ICD-10-CM | POA: Insufficient documentation

## 2013-01-25 DIAGNOSIS — S42409A Unspecified fracture of lower end of unspecified humerus, initial encounter for closed fracture: Secondary | ICD-10-CM | POA: Diagnosis not present

## 2013-01-25 DIAGNOSIS — Z419 Encounter for procedure for purposes other than remedying health state, unspecified: Secondary | ICD-10-CM

## 2013-01-25 DIAGNOSIS — M810 Age-related osteoporosis without current pathological fracture: Secondary | ICD-10-CM | POA: Insufficient documentation

## 2013-01-25 HISTORY — PX: ORIF HUMERUS FRACTURE: SHX2126

## 2013-01-25 SURGERY — OPEN REDUCTION INTERNAL FIXATION (ORIF) DISTAL HUMERUS FRACTURE
Anesthesia: General | Site: Elbow | Laterality: Left | Wound class: Clean

## 2013-01-25 MED ORDER — DOUBLE ANTIBIOTIC 500-10000 UNIT/GM EX OINT
TOPICAL_OINTMENT | CUTANEOUS | Status: AC
  Filled 2013-01-25: qty 1

## 2013-01-25 MED ORDER — CHLORHEXIDINE GLUCONATE 4 % EX LIQD: 60.0000 mL | Freq: Once | CUTANEOUS | Status: DC

## 2013-01-25 MED ORDER — CEFAZOLIN SODIUM 1-5 GM-% IV SOLN
1.0000 g | INTRAVENOUS | Status: AC
  Administered 2013-01-25: 1 g via INTRAVENOUS
  Filled 2013-01-25: qty 50

## 2013-01-25 MED ORDER — ONDANSETRON HCL 4 MG/2ML IJ SOLN
4.0000 mg | Freq: Four times a day (QID) | INTRAMUSCULAR | Status: DC | PRN
  Administered 2013-01-25: 4 mg via INTRAVENOUS
  Filled 2013-01-25: qty 2

## 2013-01-25 MED ORDER — ONDANSETRON HCL 4 MG PO TABS: 4.0000 mg | ORAL_TABLET | Freq: Four times a day (QID) | ORAL | Status: DC | PRN

## 2013-01-25 MED ORDER — PROPOFOL 10 MG/ML IV BOLUS
INTRAVENOUS | Status: DC | PRN
  Administered 2013-01-25: 150 mg via INTRAVENOUS
  Administered 2013-01-25: 10 mg via INTRAVENOUS

## 2013-01-25 MED ORDER — HYDROMORPHONE HCL PF 1 MG/ML IJ SOLN
INTRAMUSCULAR | Status: AC
  Filled 2013-01-25: qty 1

## 2013-01-25 MED ORDER — DIPHENHYDRAMINE HCL 25 MG PO CAPS
25.0000 mg | ORAL_CAPSULE | Freq: Four times a day (QID) | ORAL | Status: DC | PRN
  Administered 2013-01-26: 25 mg via ORAL
  Filled 2013-01-25: qty 1

## 2013-01-25 MED ORDER — CEFAZOLIN SODIUM 1-5 GM-% IV SOLN
1.0000 g | Freq: Three times a day (TID) | INTRAVENOUS | Status: AC
  Administered 2013-01-26 (×2): 1 g via INTRAVENOUS
  Filled 2013-01-25 (×2): qty 50

## 2013-01-25 MED ORDER — FENTANYL CITRATE 0.05 MG/ML IJ SOLN
INTRAMUSCULAR | Status: DC | PRN
  Administered 2013-01-25: 25 ug via INTRAVENOUS
  Administered 2013-01-25: 50 ug via INTRAVENOUS
  Administered 2013-01-25 (×7): 25 ug via INTRAVENOUS

## 2013-01-25 MED ORDER — OXYCODONE HCL 5 MG PO TABS
5.0000 mg | ORAL_TABLET | ORAL | Status: DC | PRN
  Administered 2013-01-25 – 2013-01-26 (×3): 10 mg via ORAL
  Administered 2013-01-26: 5 mg via ORAL
  Administered 2013-01-27: 10 mg via ORAL
  Administered 2013-01-27: 5 mg via ORAL
  Administered 2013-01-27 – 2013-01-28 (×2): 10 mg via ORAL
  Filled 2013-01-25: qty 1
  Filled 2013-01-25 (×4): qty 2
  Filled 2013-01-25: qty 1
  Filled 2013-01-25 (×2): qty 2

## 2013-01-25 MED ORDER — ONDANSETRON HCL 4 MG/2ML IJ SOLN
INTRAMUSCULAR | Status: DC | PRN
  Administered 2013-01-25: 4 mg via INTRAVENOUS

## 2013-01-25 MED ORDER — CHOLECALCIFEROL 10 MCG (400 UNIT) PO TABS
400.0000 [IU] | ORAL_TABLET | Freq: Every day | ORAL | Status: DC
  Administered 2013-01-25 – 2013-01-28 (×4): 400 [IU] via ORAL
  Filled 2013-01-25 (×4): qty 1

## 2013-01-25 MED ORDER — ADULT MULTIVITAMIN W/MINERALS CH
1.0000 | ORAL_TABLET | Freq: Every day | ORAL | Status: DC
  Administered 2013-01-25 – 2013-01-26 (×2): 1 via ORAL
  Filled 2013-01-25 (×4): qty 1

## 2013-01-25 MED ORDER — EPHEDRINE SULFATE 50 MG/ML IJ SOLN
INTRAMUSCULAR | Status: DC | PRN
  Administered 2013-01-25: 5 mg via INTRAVENOUS

## 2013-01-25 MED ORDER — PROMETHAZINE HCL 25 MG RE SUPP: 12.5000 mg | Freq: Four times a day (QID) | RECTAL | Status: DC | PRN

## 2013-01-25 MED ORDER — ALPRAZOLAM 0.5 MG PO TABS
0.5000 mg | ORAL_TABLET | Freq: Four times a day (QID) | ORAL | Status: DC | PRN
  Administered 2013-01-25 – 2013-01-26 (×2): 0.5 mg via ORAL
  Filled 2013-01-25 (×2): qty 1

## 2013-01-25 MED ORDER — OCUVITE PO TABS
1.0000 | ORAL_TABLET | Freq: Every day | ORAL | Status: DC
  Administered 2013-01-26 – 2013-01-28 (×3): 1 via ORAL
  Filled 2013-01-25 (×3): qty 1

## 2013-01-25 MED ORDER — ONDANSETRON HCL 4 MG/2ML IJ SOLN: 4.0000 mg | Freq: Once | INTRAMUSCULAR | Status: DC | PRN

## 2013-01-25 MED ORDER — LIDOCAINE HCL (CARDIAC) 20 MG/ML IV SOLN
INTRAVENOUS | Status: DC | PRN
  Administered 2013-01-25: 30 mg via INTRAVENOUS

## 2013-01-25 MED ORDER — GLYCOPYRROLATE 0.2 MG/ML IJ SOLN
INTRAMUSCULAR | Status: DC | PRN
  Administered 2013-01-25: 0.4 mg via INTRAVENOUS

## 2013-01-25 MED ORDER — FAMOTIDINE 20 MG PO TABS
20.0000 mg | ORAL_TABLET | Freq: Two times a day (BID) | ORAL | Status: DC | PRN
  Filled 2013-01-25: qty 1

## 2013-01-25 MED ORDER — ROCURONIUM BROMIDE 100 MG/10ML IV SOLN
INTRAVENOUS | Status: DC | PRN
  Administered 2013-01-25: 30 mg via INTRAVENOUS

## 2013-01-25 MED ORDER — LACTATED RINGERS IV SOLN
INTRAVENOUS | Status: DC
  Administered 2013-01-25: 15:00:00 via INTRAVENOUS

## 2013-01-25 MED ORDER — BUPIVACAINE-EPINEPHRINE PF 0.25-1:200000 % IJ SOLN
INTRAMUSCULAR | Status: AC
  Filled 2013-01-25: qty 30

## 2013-01-25 MED ORDER — 0.9 % SODIUM CHLORIDE (POUR BTL) OPTIME
TOPICAL | Status: DC | PRN
  Administered 2013-01-25: 1000 mL

## 2013-01-25 MED ORDER — TRIAMTERENE-HCTZ 37.5-25 MG PO CAPS
1.0000 | ORAL_CAPSULE | Freq: Every day | ORAL | Status: DC
Start: 2013-01-25 — End: 2013-01-25
  Filled 2013-01-25: qty 1

## 2013-01-25 MED ORDER — LACTATED RINGERS IV SOLN
INTRAVENOUS | Status: DC
  Administered 2013-01-25 – 2013-01-27 (×3): via INTRAVENOUS

## 2013-01-25 MED ORDER — VITAMIN D 400 UNITS PO TABS: 400.0000 [IU] | ORAL_TABLET | Freq: Every day | ORAL | Status: DC

## 2013-01-25 MED ORDER — SENNA 8.6 MG PO TABS
1.0000 | ORAL_TABLET | Freq: Two times a day (BID) | ORAL | Status: DC
  Administered 2013-01-25 – 2013-01-28 (×6): 8.6 mg via ORAL
  Filled 2013-01-25 (×7): qty 1

## 2013-01-25 MED ORDER — VITAMIN C 500 MG PO TABS
1000.0000 mg | ORAL_TABLET | Freq: Every day | ORAL | Status: DC
  Administered 2013-01-25 – 2013-01-28 (×4): 1000 mg via ORAL
  Filled 2013-01-25 (×4): qty 2

## 2013-01-25 MED ORDER — NEOSTIGMINE METHYLSULFATE 1 MG/ML IJ SOLN
INTRAMUSCULAR | Status: DC | PRN
  Administered 2013-01-25: 3 mg via INTRAVENOUS

## 2013-01-25 MED ORDER — TRIAMTERENE-HCTZ 37.5-25 MG PO TABS
1.0000 | ORAL_TABLET | Freq: Every day | ORAL | Status: DC
  Administered 2013-01-25 – 2013-01-28 (×4): 1 via ORAL
  Filled 2013-01-25 (×4): qty 1

## 2013-01-25 MED ORDER — LIDOCAINE HCL 4 % MT SOLN
OROMUCOSAL | Status: DC | PRN
  Administered 2013-01-25: 4 mL via TOPICAL

## 2013-01-25 MED ORDER — HYDROMORPHONE HCL PF 1 MG/ML IJ SOLN
0.2500 mg | INTRAMUSCULAR | Status: DC | PRN
  Administered 2013-01-25 (×4): 0.5 mg via INTRAVENOUS

## 2013-01-25 MED ORDER — BUPIVACAINE HCL (PF) 0.25 % IJ SOLN
INTRAMUSCULAR | Status: AC
  Filled 2013-01-25: qty 30

## 2013-01-25 MED ORDER — MORPHINE SULFATE 2 MG/ML IJ SOLN
1.0000 mg | INTRAMUSCULAR | Status: DC | PRN
  Administered 2013-01-25 – 2013-01-26 (×3): 1 mg via INTRAVENOUS
  Filled 2013-01-25 (×3): qty 1

## 2013-01-25 MED ORDER — PHENYLEPHRINE HCL 10 MG/ML IJ SOLN
INTRAMUSCULAR | Status: DC | PRN
  Administered 2013-01-25 (×9): 40 ug via INTRAVENOUS

## 2013-01-25 MED ORDER — LACTATED RINGERS IV SOLN
INTRAVENOUS | Status: DC | PRN
  Administered 2013-01-25 (×3): via INTRAVENOUS

## 2013-01-25 MED ORDER — ARTIFICIAL TEARS OP OINT
TOPICAL_OINTMENT | OPHTHALMIC | Status: DC | PRN
  Administered 2013-01-25: 1 via OPHTHALMIC

## 2013-01-25 SURGICAL SUPPLY — 82 items
BANDAGE ELASTIC 3 VELCRO ST LF (GAUZE/BANDAGES/DRESSINGS) ×2 IMPLANT
BANDAGE ELASTIC 4 VELCRO ST LF (GAUZE/BANDAGES/DRESSINGS) IMPLANT
BANDAGE GAUZE ELAST BULKY 4 IN (GAUZE/BANDAGES/DRESSINGS) ×2 IMPLANT
BIT DRILL 2.5X2.75 QC CALB (BIT) ×2 IMPLANT
BIT DRILL CALIBRATED 2.7 (BIT) ×2 IMPLANT
BLADE LONG MED 31X9 (MISCELLANEOUS) ×2 IMPLANT
BNDG ESMARK 4X9 LF (GAUZE/BANDAGES/DRESSINGS) ×2 IMPLANT
CANISTER SUCTION 2500CC (MISCELLANEOUS) ×2 IMPLANT
CLOTH BEACON ORANGE TIMEOUT ST (SAFETY) ×2 IMPLANT
CORDS BIPOLAR (ELECTRODE) ×2 IMPLANT
COVER MAYO STAND STRL (DRAPES) IMPLANT
COVER SURGICAL LIGHT HANDLE (MISCELLANEOUS) ×2 IMPLANT
CUFF TOURNIQUET SINGLE 18IN (TOURNIQUET CUFF) ×2 IMPLANT
CUFF TOURNIQUET SINGLE 24IN (TOURNIQUET CUFF) IMPLANT
DRAPE INCISE IOBAN 66X45 STRL (DRAPES) ×2 IMPLANT
DRAPE OEC MINIVIEW 54X84 (DRAPES) ×2 IMPLANT
DRAPE PROXIMA HALF (DRAPES) ×2 IMPLANT
DRSG ADAPTIC 3X8 NADH LF (GAUZE/BANDAGES/DRESSINGS) ×2 IMPLANT
GAUZE XEROFORM 1X8 LF (GAUZE/BANDAGES/DRESSINGS) IMPLANT
GAUZE XEROFORM 5X9 LF (GAUZE/BANDAGES/DRESSINGS) ×2 IMPLANT
GLOVE BIOGEL M STRL SZ7.5 (GLOVE) ×2 IMPLANT
GLOVE BIOGEL PI IND STRL 7.5 (GLOVE) ×2 IMPLANT
GLOVE BIOGEL PI INDICATOR 7.5 (GLOVE) ×2
GLOVE ECLIPSE 7.0 STRL STRAW (GLOVE) ×4 IMPLANT
GLOVE SS BIOGEL STRL SZ 8 (GLOVE) ×1 IMPLANT
GLOVE SUPERSENSE BIOGEL SZ 8 (GLOVE) ×1
GOWN BRE IMP SLV SIRUS LXLNG (GOWN DISPOSABLE) ×4 IMPLANT
GOWN PREVENTION PLUS XLARGE (GOWN DISPOSABLE) IMPLANT
GOWN STRL NON-REIN LRG LVL3 (GOWN DISPOSABLE) IMPLANT
GOWN STRL REIN XL XLG (GOWN DISPOSABLE) ×4 IMPLANT
KIT BASIN OR (CUSTOM PROCEDURE TRAY) ×2 IMPLANT
KIT ROOM TURNOVER OR (KITS) ×2 IMPLANT
LOOP VESSEL MAXI BLUE (MISCELLANEOUS) ×2 IMPLANT
LOOP VESSEL MINI RED (MISCELLANEOUS) ×2 IMPLANT
MANIFOLD NEPTUNE II (INSTRUMENTS) IMPLANT
NEEDLE HYPO 25GX1X1/2 BEV (NEEDLE) ×2 IMPLANT
NS IRRIG 1000ML POUR BTL (IV SOLUTION) ×2 IMPLANT
PACK ORTHO EXTREMITY (CUSTOM PROCEDURE TRAY) ×2 IMPLANT
PAD ARMBOARD 7.5X6 YLW CONV (MISCELLANEOUS) ×4 IMPLANT
PAD CAST 4YDX4 CTTN HI CHSV (CAST SUPPLIES) ×2 IMPLANT
PADDING CAST COTTON 4X4 STRL (CAST SUPPLIES) ×2
PADDING CAST COTTON 6X4 STRL (CAST SUPPLIES) ×2 IMPLANT
PIN THREADED GUIDE ACE (PIN) ×2 IMPLANT
PLATE DIST HUM LAT LT SM (Plate) ×2 IMPLANT
PLATE DIST HUM MEDIAL LT SM (Plate) ×2 IMPLANT
SCREW CANN 6.5 95MM (Screw) ×1 IMPLANT
SCREW CANN LG 6.5 FLT 95X22 (Screw) ×1 IMPLANT
SCREW CORT 3.5X26 (Screw) ×1 IMPLANT
SCREW CORT T15 26X3.5XST LCK (Screw) ×1 IMPLANT
SCREW CORT T15 28X3.5XST LCK (Screw) ×1 IMPLANT
SCREW CORTICAL 3.5X28MM (Screw) ×1 IMPLANT
SCREW LOCK 3.5X20 DIST TIB (Screw) ×2 IMPLANT
SCREW LOCK 3.5X24 DIST TIB (Screw) ×2 IMPLANT
SCREW LOCK 3.5X30 DIST TIB (Screw) ×2 IMPLANT
SCREW LOCK CORT STAR 3.5X10 (Screw) ×2 IMPLANT
SCREW LOCK CORT STAR 3.5X20 (Screw) ×2 IMPLANT
SCREW LOCK CORT STAR 3.5X22 (Screw) ×2 IMPLANT
SCREW LOCK CORT STAR 3.5X26 (Screw) ×2 IMPLANT
SCREW LOCK CORT STAR 3.5X28 (Screw) ×2 IMPLANT
SCREW LOCK CORT STAR 3.5X34 (Screw) ×2 IMPLANT
SCREW LOCK CORT STAR 3.5X40 (Screw) ×2 IMPLANT
SCREW LOCK CORT STAR 3.5X46 (Screw) ×2 IMPLANT
SCREW LOW PROFILE 22MMX3.5MM (Screw) ×2 IMPLANT
SOLUTION BETADINE 4OZ (MISCELLANEOUS) ×2 IMPLANT
SPONGE GAUZE 4X4 12PLY (GAUZE/BANDAGES/DRESSINGS) ×2 IMPLANT
SPONGE LAP 18X18 X RAY DECT (DISPOSABLE) ×2 IMPLANT
SPONGE SCRUB IODOPHOR (GAUZE/BANDAGES/DRESSINGS) ×2 IMPLANT
SUCTION FRAZIER TIP 10 FR DISP (SUCTIONS) ×2 IMPLANT
SUCTION HEMOVAC SNY HYST (SUCTIONS) ×2 IMPLANT
SUT MERSILENE 4 0 P 3 (SUTURE) IMPLANT
SUT PROLENE 4 0 PS 2 18 (SUTURE) IMPLANT
SUT VIC AB 2-0 CT1 27 (SUTURE)
SUT VIC AB 2-0 CT1 TAPERPNT 27 (SUTURE) IMPLANT
SUT VIC AB 3-0 FS2 27 (SUTURE) ×6 IMPLANT
SYR CONTROL 10ML LL (SYRINGE) ×2 IMPLANT
TOWEL OR 17X24 6PK STRL BLUE (TOWEL DISPOSABLE) ×2 IMPLANT
TOWEL OR 17X26 10 PK STRL BLUE (TOWEL DISPOSABLE) ×4 IMPLANT
TUBE CONNECTING 12X1/4 (SUCTIONS) ×2 IMPLANT
UNDERPAD 30X30 INCONTINENT (UNDERPADS AND DIAPERS) IMPLANT
WASHER 3.5MM (Orthopedic Implant) ×2 IMPLANT
WATER STERILE IRR 1000ML POUR (IV SOLUTION) IMPLANT
WIRE 18GA COCR 1PK (WIRE) ×2 IMPLANT

## 2013-01-25 NOTE — Anesthesia Preprocedure Evaluation (Addendum)
Anesthesia Evaluation  Patient identified by MRN, date of birth, ID band Patient awake    Reviewed: Allergy & Precautions, H&P , NPO status , Patient's Chart, lab work & pertinent test results  Airway  TM Distance: >3 FB Neck ROM: full    Dental  (+) Dental Advisory Given and Caps   Pulmonary          Cardiovascular hypertension, Rhythm:regular Rate:Normal     Neuro/Psych    GI/Hepatic   Endo/Other    Renal/GU      Musculoskeletal   Abdominal   Peds  Hematology   Anesthesia Other Findings   Reproductive/Obstetrics                          Anesthesia Physical Anesthesia Plan  ASA: II  Anesthesia Plan: General   Post-op Pain Management:    Induction: Intravenous  Airway Management Planned: Oral ETT  Additional Equipment:   Intra-op Plan:   Post-operative Plan: Extubation in OR  Informed Consent: I have reviewed the patients History and Physical, chart, labs and discussed the procedure including the risks, benefits and alternatives for the proposed anesthesia with the patient or authorized representative who has indicated his/her understanding and acceptance.     Plan Discussed with: CRNA, Anesthesiologist and Surgeon  Anesthesia Plan Comments:         Anesthesia Quick Evaluation

## 2013-01-25 NOTE — Anesthesia Procedure Notes (Addendum)
Procedure Name: Intubation Date/Time: 01/25/2013 4:06 PM Performed by: Luster Landsberg Pre-anesthesia Checklist: Patient identified, Emergency Drugs available, Suction available and Patient being monitored Patient Re-evaluated:Patient Re-evaluated prior to inductionOxygen Delivery Method: Circle system utilized Preoxygenation: Pre-oxygenation with 100% oxygen Intubation Type: IV induction Ventilation: Mask ventilation without difficulty Laryngoscope Size: Mac and 3 Grade View: Grade II Tube type: Oral Tube size: 7.0 mm Number of attempts: 1 Airway Equipment and Method: Stylet and LTA kit utilized Placement Confirmation: ETT inserted through vocal cords under direct vision,  breath sounds checked- equal and bilateral and positive ETCO2 Secured at: 22 cm Tube secured with: Tape Dental Injury: Teeth and Oropharynx as per pre-operative assessment

## 2013-01-25 NOTE — Transfer of Care (Signed)
Immediate Anesthesia Transfer of Care Note  Patient: Gina Mcgee  Procedure(s) Performed: Procedure(s): OPEN REDUCTION INTERNAL FIXATION (ORIF) DISTAL HUMERUS FRACTURE REPAIR RECONSTRUCTION AND ULNA NERVE DECOMPRESSION AND ANTERIOR TRANSPOSITION AS NECESSARY (Left)  Patient Location: PACU  Anesthesia Type:General  Level of Consciousness: awake  Airway & Oxygen Therapy: Patient Spontanous Breathing and Patient connected to nasal cannula oxygen  Post-op Assessment: Report given to PACU RN and Post -op Vital signs reviewed and stable  Post vital signs: Reviewed and stable  Complications: No apparent anesthesia complications

## 2013-01-25 NOTE — H&P (Signed)
Gina Mcgee is an 80 y.o. female.   Chief Complaint: Left elbow FX HPI: Marland KitchenMarland KitchenPatient presents for evaluation and treatment of the of their upper extremity predicament. The patient denies neck back chest or of abdominal pain. The patient notes that they have no lower extremity problems. The patient from primarily complains of the upper extremity pain noted.  Past Medical History  Diagnosis Date  . 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 Cancer     left-sided breast cancer  . HTN (hypertension)   . Brain concussion 1960    Past Surgical History  Procedure Laterality Date  . Joint replacement    . Femur surgery  2011    left femur pinning  . Bowel resection  approx. in 2007    polyp and formed stricture  . Breast lumpectomy  1981    left lumpectomy w/ radiation  . Colonoscopy w/ polypectomy      Family History  Problem Relation Age of Onset  . Stroke Mother   . Lung cancer Father   . Stroke Father    Social History:  reports that she has never smoked. She has never used smokeless tobacco. She reports that she does not drink alcohol or use illicit drugs.  Allergies:  Allergies  Allergen Reactions  . Aspirin Other (See Comments)    Gets jitter with large doses  . Fish Allergy Nausea Only and Other (See Comments)    "makes me jumpy and twitchy, but grand children are allergic"   . Nsaids     Can take for short periods of time. Naprosyn -feels dazed  . Aloe Rash  . Sulfa Antibiotics Rash    Medications Prior to Admission  Medication Sig Dispense Refill  . beta carotene w/minerals (OCUVITE) tablet Take 1 tablet by mouth daily.      . Calcium Carbonate-Vitamin D (CALCIUM 500 + D PO) Take 1 tablet by mouth 2 (two) times daily.      . Cholecalciferol (VITAMIN D3) 1000 UNITS CAPS Take by mouth.      Marland Kitchen HYDROcodone-acetaminophen (NORCO) 5-325 MG per tablet Take 1-2 tablets by mouth every 6 (six) hours as needed for pain.  20 tablet  0  .  triamterene-hydrochlorothiazide (DYAZIDE) 37.5-25 MG per capsule Take 1 capsule by mouth every morning.      . vitamin D, CHOLECALCIFEROL, 400 UNITS tablet Take 400 Units by mouth daily.        Results for orders placed during the hospital encounter of 01/24/13 (from the past 48 hour(s))  SURGICAL PCR SCREEN     Status: None   Collection Time    01/24/13  8:50 AM      Result Value Range   MRSA, PCR NEGATIVE  NEGATIVE   Staphylococcus aureus NEGATIVE  NEGATIVE   Comment:            The Xpert SA Assay (FDA     approved for NASAL specimens     in patients over 60 years of age),     is one component of     a comprehensive surveillance     program.  Test performance has     been validated by [employer] for patients greater     than or equal to 87 year old.     It is not intended     to diagnose infection nor to     guide or monitor treatment.  BASIC METABOLIC PANEL  Status: Abnormal   Collection Time    01/24/13  9:00 AM      Result Value Range   Sodium 137  135 - 145 mEq/L   Potassium 3.5  3.5 - 5.1 mEq/L   Chloride 97  96 - 112 mEq/L   CO2 31  19 - 32 mEq/L   Glucose, Bld 92  70 - 99 mg/dL   BUN 17  6 - 23 mg/dL   Creatinine, Ser 1.61  0.50 - 1.10 mg/dL   Calcium 09.6  8.4 - 04.5 mg/dL   GFR calc non Af Amer 67 (*) >90 mL/min   GFR calc Af Amer 77 (*) >90 mL/min   Comment:            The eGFR has been calculated     using the CKD EPI equation.     This calculation has not been     validated in all clinical     situations.     eGFR's persistently     <90 mL/min signify     possible Chronic Kidney Disease.  CBC     Status: None   Collection Time    01/24/13  9:01 AM      Result Value Range   WBC 6.4  4.0 - 10.5 K/uL   RBC 4.15  3.87 - 5.11 MIL/uL   Hemoglobin 12.4  12.0 - 15.0 g/dL   HCT 40.9  81.1 - 91.4 %   MCV 88.4  78.0 - 100.0 fL   MCH 29.9  26.0 - 34.0 pg   MCHC 33.8  30.0 - 36.0 g/dL   RDW 78.2  95.6 - 21.3 %   Platelets 374  150 - 400 K/uL   No  results found.  Review of Systems  Constitutional: Negative.   HENT: Negative.   Eyes: Negative.   Respiratory: Negative.   Cardiovascular: Negative.   Gastrointestinal: Negative.   Genitourinary: Negative.   Skin: Negative.   Neurological: Negative.     Blood pressure 142/84, pulse 56, temperature 97.4 F (36.3 C), temperature source Oral, resp. rate 20, SpO2 97.00%. Physical Exam  ..The patient is alert and oriented in no acute distress the patient complains of pain in the affected upper extremity.  The patient is noted to have a normal HEENT exam.  Lung fields show equal chest expansion and no shortness of breath  abdomen exam is nontender without distention.  Lower extremity examination does not show any fracture dislocation or blood clot symptoms.  Pelvis is stable neck and back are stable and nontender  Left T-condylar elbow FX  Assessment/Plan .Marland KitchenWe are planning surgery for your upper extremity. The risk and benefits of surgery include risk of bleeding infection anesthesia damage to normal structures and failure of the surgery to accomplish its intended goals of relieving symptoms and restoring function with this in mind we'll going to proceed. I have specifically discussed with the patient the pre-and postoperative regime and the does and don'ts and risk and benefits in great detail. Risk and benefits of surgery also include risk of dystrophy chronic nerve pain failure of the healing process to go onto completion and other inherent risks of surgery The relavent the pathophysiology of the disease/injury process, as well as the alternatives for treatment and postoperative course of action has been discussed in great detail with the patient who desires to proceed.  We will do everything in our power to help you (the patient) restore function to the upper extremity. Is a  pleasure to see this patient today.   Karen Chafe 01/25/2013, 3:55 PM

## 2013-01-25 NOTE — Op Note (Signed)
See op [id number] Dominica Severin MD

## 2013-01-26 ENCOUNTER — Encounter (HOSPITAL_COMMUNITY): Payer: Self-pay | Admitting: Orthopedic Surgery

## 2013-01-26 DIAGNOSIS — I10 Essential (primary) hypertension: Secondary | ICD-10-CM | POA: Diagnosis not present

## 2013-01-26 DIAGNOSIS — R918 Other nonspecific abnormal finding of lung field: Secondary | ICD-10-CM | POA: Diagnosis not present

## 2013-01-26 DIAGNOSIS — Z9010 Acquired absence of unspecified breast and nipple: Secondary | ICD-10-CM | POA: Diagnosis not present

## 2013-01-26 DIAGNOSIS — M810 Age-related osteoporosis without current pathological fracture: Secondary | ICD-10-CM | POA: Diagnosis not present

## 2013-01-26 DIAGNOSIS — IMO0002 Reserved for concepts with insufficient information to code with codable children: Secondary | ICD-10-CM | POA: Diagnosis not present

## 2013-01-26 DIAGNOSIS — Z853 Personal history of malignant neoplasm of breast: Secondary | ICD-10-CM | POA: Diagnosis not present

## 2013-01-26 LAB — BASIC METABOLIC PANEL
Chloride: 95 mEq/L — ABNORMAL LOW (ref 96–112)
GFR calc Af Amer: >90 mL/min (ref >90)
GFR calc non Af Amer: 81 mL/min — ABNORMAL LOW (ref >90)
Potassium: 2.9 mEq/L — ABNORMAL LOW (ref 3.5–5.1)
Sodium: 133 mEq/L — ABNORMAL LOW (ref 135–145)

## 2013-01-26 LAB — CBC WITH DIFFERENTIAL/PLATELET
Basophils Absolute: 0 10*3/uL (ref 0.0–0.1)
Basophils Relative: 0 % (ref 0–1)
Eosinophils Absolute: 0 10*3/uL (ref 0.0–0.7)
Hemoglobin: 9.8 g/dL — ABNORMAL LOW (ref 12.0–15.0)
MCH: 29.9 pg (ref 26.0–34.0)
MCHC: 34.5 g/dL (ref 30.0–36.0)
Monocytes Relative: 7 % (ref 3–12)
Neutro Abs: 6.4 10*3/uL (ref 1.7–7.7)
Neutrophils Relative %: 86 % — ABNORMAL HIGH (ref 43–77)
Platelets: 271 10*3/uL (ref 150–400)

## 2013-01-26 MED ORDER — POTASSIUM CHLORIDE CRYS ER 20 MEQ PO TBCR
20.0000 meq | EXTENDED_RELEASE_TABLET | Freq: Every day | ORAL | Status: DC
  Administered 2013-01-26 – 2013-01-28 (×3): 20 meq via ORAL
  Filled 2013-01-26 (×3): qty 1

## 2013-01-26 NOTE — Op Note (Signed)
NAMEYACHET, MATTSON NO.:  [id number]  MEDICAL RECORD NO.:  [id number]  LOCATION:  5N32C                        FACILITY:  [facility]  PHYSICIAN:  Dionne Ano. Kailoni Vahle, M.D.DATE OF BIRTH:  July 17, 1930  DATE OF PROCEDURE: DATE OF DISCHARGE:                              OPERATIVE REPORT   PREOPERATIVE DIAGNOSIS:  Comminuted complex intra-articular distal humerus fracture (comminuted T-condylar type fracture), left elbow in an 79 year old female with osteoporosis.  POSTOPERATIVE DIAGNOSIS:  Comminuted complex intra-articular distal humerus fracture (comminuted T-condylar type fracture), left elbow in an 79 year old female with osteoporosis.  PROCEDURES: 1. Open reduction and internal fixation of T-condylar elbow fracture,     left upper extremity with Biomet parallel plating system. 2. Ulnar nerve decompression and anterior transposition. 3. Olecranon osteotomy with direct repair. 4. Stress radiography.  SURGEON:  Dionne Ano. Amanda Pea, M.D.  ASSISTANT:  Karie Chimera, P.A.-C.  COMPLICATION:  None.  ANESTHESIA:  General.  TOURNIQUET TIME:  An hour and 31 minutes.  DRAINS:  One.  INDICATIONS:  An 79 year old female comminuted complex interarticular fracture.  She was cleared by Medicine (Dr. Regino Schultze).  I discussed the risks and benefits.  I have discussed this extensively with she and her family.  They desired to proceed.  Given her activity level and other hopes for her independence in the future, I feel that given her arm that is stable is the best option for her.  OPERATION IN DETAIL:  The patient was seen by myself and Anesthesia, taken to the operating suite and underwent smooth induction of general anesthesia, laid supine, appropriately padded.  Prepped and draped in usual sterile fashion.  She was placed in a modified sloppy lateral position.  Axillary roll was placed and body parts were well padded. Foley catheter was placed.  SCD hose were placed.  She  was scrubbed with 10 minutes surgical Betadine scrub followed by sterile prep.  The patient tolerated this well.  Once this was done, sterile tourniquet was applied.  Arm was elevated and tourniquet was insufflated to 250 mmHg. Utilitarian posterior approach was made.  Dissection was carried down. Triceps was encounter and the patient was elevated off of the fascial adipose interval.  Following this, identified the ulnar nerve, I released the arcade of Struthers, medial intermuscular septum, which was excised, two heads of the FCU, Osborne ligament, and the cubital tunnel. Nerve had a vessel loop placed around it.  It was gently placed anteriorly, it was highly irritated with contusive injury.  Following this, I then performed placement of a guidepin for the 6.5 cannulated screw.  I then brought in stress radiography, verifying position and performed an oscillating saw cut, Chevron in nature about the near cortex followed by an osteotome and the far part cortex for the olecranon osteotomy.  This was then mobilized and the olecranon osteotomy and triceps were swung proximally. Following this, I then identified the fracture site that was highly comminuted intra-articular fracture.  The patient had large amount of bone loss and highly comminuted comminution.  Once this was done, I was very careful to irrigate, debride and then placed provisional fixation with a 0.062 K-wires.  Following this, I applied a medial and lateral  plates from the Biomet system.  I used combination locking and nonlocking screws as well as a multidirectional screw to achieve adequate fixation of this spool.  I made modifications and adjustments to the positions as necessary and was able to achieve excellent fixation in the form of parallel plating.  I was pleased that this had excellent stability, bone grafted with 5 mL of allograft bone graft and following this, demonstrated excellent joint line and excellent  stability on live fluoro.  X-rays were taken for documentation purposes.  Following this, I then placed a guidepin back to the olecranon osteotomy with towel clips.  I then reduced the olecranon osteotomy.  I placed two 18-gauge wires through drill holes in the ulna following this with very careful technique.  I placed the wires around the tip of the olecranon and then tightened them down after a screw with washer was advanced approximately 95 mm (cannulated 6.5 mm screw).  Thus, olecranon osteotomy was repaired without difficulty.  I had excellent fixation.  I was pleased with these findings.  Following this, we irrigated copiously, closed the fascia.  I then used a fascial sling to harness the nerve anteriorly as I did not want to sublux and want to keep it free from tension.  Given the fact that the ulnar nerve was highly contused, I wanted to give the best environment as possible.  The tourniquet was deflated at an hour and 31 minutes.  Drain was placed.  Wound was then closed with Vicryl and the subcu followed by staple clips at the skin edge. Adaptic, Xeroform, Neosporin, soft dressing followed by posterior plaster splint and stirrup fiberglass were placed.  The patient had excellent pulse, good refill, soft compartments.  No complicating features.  She tolerated this well.  She was taken to recovery room in stable condition.  She will be admitted for IV antibiotics, general postop observatory care and other measures.  I have discussed the relevant issues, do's and don'ts, etc.  We look for to see her in the office in follow up.  We will go ahead and get her set up with therapy for gentle interval well, arm assisted range of motion according to our protocol for a distal T-condylar elbow fracture.  It was pleasure to see her today.  All questions have been encouraged and answered.  I was very pleased with the final construct. AP, lateral and oblique x-rays (stress radiography)  showed excellent position.     Dionne Ano. Amanda Pea, M.D.     [facility]  D:  01/25/2013  T:  01/26/2013  Job:  [id number]

## 2013-01-26 NOTE — Progress Notes (Signed)
Subjective: 1 Day Post-Op Procedure(s) (LRB): OPEN REDUCTION INTERNAL FIXATION (ORIF) DISTAL HUMERUS FRACTURE REPAIR RECONSTRUCTION AND ULNA NERVE DECOMPRESSION AND ANTERIOR TRANSPOSITION AS NECESSARY (Left) Patient reports pain as mild.   Patient is alert and oriented. She is appropriate. She's not been out of bed yet. She is SCD hose place. I examined her at length. She states her pain is under good control this point.  The patient has had a history of radiation and a history of problems with her left arm in terms of weakness and poor hand usage even before her elbow fracture. At present time she states she does not appear to be any significantly worse after the surgery. She doesn't a little bit of increased numbness in the ulnar nerve distribution which is perfectly acceptable.  Her ulnar nerve is very contused at time of surgery.  The patient is tolerating her diet. She is voiding.  Objective: Vital signs in last 24 hours: Temp:  [96.8 F (36 C)-97.9 F (36.6 C)] 97.9 F (36.6 C) (05/06 2100) Pulse Rate:  [56-100] 84 (05/07 1101) Resp:  [16-28] 18 (05/06 2100) BP: (91-143)/(53-84) 91/53 mmHg (05/07 1101) SpO2:  [97 %-100 %] 99 % (05/06 2100) Weight:  [48.988 kg (108 lb)] 48.988 kg (108 lb) (05/07 0250)  Intake/Output from previous day: 05/06 0701 - 05/07 0700 In: 2450 [I.V.:2450] Out: 825 [Urine:600; Drains:25; Blood:200] Intake/Output this shift: Total I/O In: -  Out: 50 [Drains:50]   Recent Labs  01/24/13 0901 01/26/13 0520  HGB 12.4 9.8*    Recent Labs  01/24/13 0901 01/26/13 0520  WBC 6.4 7.4  RBC 4.15 3.28*  HCT 36.7 28.4*  PLT 374 271    Recent Labs  01/24/13 0900 01/26/13 0520  NA 137 133*  K 3.5 2.9*  CL 97 95*  CO2 31 29  BUN 17 12  CREATININE 0.81 0.67  GLUCOSE 92 125*  CALCIUM 10.1 8.5   No results found for this basename: LABPT, INR,  in the last 72 hours Patient has normal physical exam findings in terms of her HEENT. Chest is clear.  She has no signs of DVT or infection in her lower extremities. Her right upper extremity is neurovascularly intact. Her left upper extremity has intact DP L. and FPL function. Radial nerve is intact. She is sensate in her median nerve. Her ulnar nerve has some numbness which is expected given a severe contusive injury to her nerve at the time of surgery noted. I reviewed this with her at length. The a patient has poor flexion to the small and ring finger and does not have particularly great flexion about the index or middle finger in my estimation. She states this is been somewhat long-standing.  She is nontender on passive extension she is nontender with manipulation of the arm and does not appear to be having any signs or symptoms of compartment syndrome.  Overall she looks fairly well given her age activity level and preop exam. ABD soft No cellulitis present Compartment soft  Assessment/Plan: 1 Day Post-Op Procedure(s) (LRB): OPEN REDUCTION INTERNAL FIXATION (ORIF) DISTAL HUMERUS FRACTURE REPAIR RECONSTRUCTION AND ULNA NERVE DECOMPRESSION AND ANTERIOR TRANSPOSITION AS NECESSARY (Left) Advance diet Up with therapy Patient will need therapy in the form of occupational and physical therapy. I have discussed with her living situation and would consider a social services consult to see if she is capable of in-home therapy. The patient frequently has visiting angels help she and her husband at home.  We'll continue antibiotics. we will  check a repeat hematocrit tomorrow. I feel that the drop in her hematocrit is do to acute blood loss anemia.  I have removed her drain. I discussed with her all issues. Will keep a close careful watch on her over the next few days. Earliest anticipated discharge date would likely be Friday pending her therapy progress and her medical disposition etc.  It was a pleasure to see this delightful lady today. all questions have been encouraged and answered  Gina Mcgee  III,Gina Mcgee 01/26/2013, 1:07 PM

## 2013-01-26 NOTE — Anesthesia Postprocedure Evaluation (Signed)
Anesthesia Post Note  Patient: Gina Mcgee  Procedure(s) Performed: Procedure(s) (LRB): OPEN REDUCTION INTERNAL FIXATION (ORIF) DISTAL HUMERUS FRACTURE REPAIR RECONSTRUCTION AND ULNA NERVE DECOMPRESSION AND ANTERIOR TRANSPOSITION AS NECESSARY (Left)  Anesthesia type: general  Patient location: PACU  Post pain: Pain level controlled  Post assessment: Patient's Cardiovascular Status Stable  Last Vitals:  Filed Vitals:   01/25/13 2100  BP: 143/71  Pulse: 67  Temp: 36.6 C  Resp: 18    Post vital signs: Reviewed and stable  Level of consciousness: sedated  Complications: No apparent anesthesia complications

## 2013-01-26 NOTE — Evaluation (Signed)
Physical Therapy Evaluation Patient Details Name: Gina Mcgee MRN: [id number] DOB: 12-Apr-1930 Today's Date: 01/26/2013 Time: 4098-1191 PT Time Calculation (min): 48 min  PT Assessment / Plan / Recommendation Clinical Impression  Pt is a 79y.o. female adm to [facility] secondary to fall and is s/p ORIF R elbow. Pt presents with decreased safety awareness, decreased mobility and independence with gt/transfers. Pt is insistint upon returning home with 24/7 personal care attendant and husband. Disucssed with pt that she would be unsafe to be alone at any point. Pt is a fall risk. Recommend HHPT and W/C for mobility if she discharges home. Pt to benefit from skilled PT to maximize functional mobility.     PT Assessment  Patient needs continued PT services    Follow Up Recommendations  Home health PT;Supervision/Assistance - 24 hour    Does the patient have the potential to tolerate intense rehabilitation      Barriers to Discharge        Equipment Recommendations  Wheelchair (measurements PT);Wheelchair cushion (measurements PT) (3 in 1 commode )    Recommendations for Other Services     Frequency Min 5X/week    Precautions / Restrictions Precautions Precautions: Fall Precaution Comments: pt has history of falls  Required Braces or Orthoses: Other Brace/Splint Other Brace/Splint: L UE sling Restrictions Weight Bearing Restrictions: Yes LUE Weight Bearing: Non weight bearing   Pertinent Vitals/Pain Pt did not rate pain; would yell out in pain with movement or activity of L UE. Pt repositioned in chair with O2 in place.       Mobility  Bed Mobility Bed Mobility: Supine to Sit;Sitting - Scoot to Edge of Bed Supine to Sit: 4: Min assist;3: Mod assist;With rails;HOB elevated Sitting - Scoot to [place] of Bed: 4: Min assist Details for Bed Mobility Assistance: pt required cues for hand placement and sequecing; required increased time and assistance to advance trunk to upright position  while maintaining NWB on LUE Transfers Transfers: Sit to Stand;Stand to Sit Sit to Stand: 4: Min assist;From bed;From elevated surface;With upper extremity assist;From chair/3-in-1 Stand to Sit: To chair/3-in-1;3: Mod assist;With armrests;With upper extremity assist Details for Transfer Assistance: pt demo decreased safety awareness; would begin to sit down before having chair behind her; very impulsive; requires constant cues for safety  Ambulation/Gait Ambulation/Gait Assistance: 3: Mod assist Ambulation Distance (Feet): 70 Feet Assistive device: Straight cane Ambulation/Gait Assistance Details: pt requires constant bracing to steady; pt demo posterior/lateral lean to left; when fatigued will begin to flex trunk significantly; pt required constant cues for safety with amb  Gait Pattern: Decreased stride length;Shuffle;Trunk flexed;Narrow base of support Gait velocity: decreased  Stairs: No Wheelchair Mobility Wheelchair Mobility: No    Exercises     PT Diagnosis: Difficulty walking  PT Problem List: Decreased balance;Decreased mobility;Decreased knowledge of use of DME;Decreased safety awareness;Pain;Decreased knowledge of precautions PT Treatment Interventions: DME instruction;Gait training;Functional mobility training;Therapeutic activities;Therapeutic exercise;Balance training;Neuromuscular re-education;Patient/family education   PT Goals Acute Rehab PT Goals PT Goal Formulation: With patient Time For Goal Achievement: 02/02/13 Potential to Achieve Goals: Fair Pt will go Supine/Side to Sit: with modified independence PT Goal: Supine/Side to Sit - Progress: Goal set today Pt will go Sit to Supine/Side: with modified independence PT Goal: Sit to Supine/Side - Progress: Goal set today Pt will go Sit to Stand: with supervision PT Goal: Sit to Stand - Progress: Goal set today Pt will go Stand to Sit: with supervision PT Goal: Stand to Sit - Progress: Goal set today Pt  will  Ambulate: >150 feet;with supervision;with least restrictive assistive device PT Goal: Ambulate - Progress: Goal set today  Visit Information  Last PT Received On: 01/26/13 Assistance Needed: +2 PT/OT Co-Evaluation/Treatment: Yes    Subjective Data  Subjective: Pt lying supine with husband and home health aide present; agreeable to therapy. " I have been walking with a walker with one arm"  Patient Stated Goal: home with 24/7 help and husband    Prior Functioning  Home Living Lives With: Spouse;Other (Comment) (has home aide 24/7) Available Help at Discharge: Personal care attendant;Available 24 hours/day Type of Home: House Home Access: Level entry Home Layout: One level Bathroom Shower/Tub: Engineer, manufacturing systems: Handicapped height Bathroom Accessibility: Yes How Accessible: Accessible via wheelchair;Accessible via walker Home Adaptive Equipment: Walker - standard Additional Comments: husband has equipment but will need to use it  Prior Function Level of Independence: Independent (Prior to fall 2 weeks ago) Able to Take Stairs?: Yes Driving: No Communication Communication: No difficulties    Cognition  Cognition Arousal/Alertness: Awake/alert Behavior During Therapy: Impulsive Overall Cognitive Status: Within Functional Limits for tasks assessed Area of Impairment: Safety/judgement Safety/Judgement: Decreased awareness of safety General Comments: pt very impulsive; attempted to sit down prior to chair being setup behind her; unaware of balance deficits    Extremity/Trunk Assessment Right Lower Extremity Assessment RLE ROM/Strength/Tone: WFL for tasks assessed RLE Sensation: WFL - Light Touch Left Lower Extremity Assessment LLE ROM/Strength/Tone: WFL for tasks assessed LLE Sensation: WFL - Light Touch Trunk Assessment Trunk Assessment: Kyphotic   Balance Balance Balance Assessed: Yes Static Sitting Balance Static Sitting - Balance Support: Bilateral upper  extremity supported;Feet supported Static Sitting - Level of Assistance: 5: Stand by assistance Static Sitting - Comment/# of Minutes: pt demo posterior lean when sitting for ~5 min secondary to fatigue   End of Session PT - End of Session Equipment Utilized During Treatment: Gait belt Activity Tolerance: Patient tolerated treatment well Patient left: in chair;with call bell/phone within reach;with family/visitor present Nurse Communication: Mobility status  GP Functional Assessment Tool Used: clinical judgement Functional Limitation: Mobility: Walking and moving around Mobility: Walking and Moving Around Current Status [phone]): At least 40 percent but less than 60 percent impaired, limited or restricted Mobility: Walking and Moving Around Goal Status [phone]): At least 1 percent but less than 20 percent impaired, limited or restricted   Donell Sievert, [place] [phone] 01/26/2013, 4:07 PM

## 2013-01-27 DIAGNOSIS — M810 Age-related osteoporosis without current pathological fracture: Secondary | ICD-10-CM | POA: Diagnosis not present

## 2013-01-27 DIAGNOSIS — R918 Other nonspecific abnormal finding of lung field: Secondary | ICD-10-CM | POA: Diagnosis not present

## 2013-01-27 DIAGNOSIS — Z853 Personal history of malignant neoplasm of breast: Secondary | ICD-10-CM | POA: Diagnosis not present

## 2013-01-27 DIAGNOSIS — IMO0002 Reserved for concepts with insufficient information to code with codable children: Secondary | ICD-10-CM | POA: Diagnosis not present

## 2013-01-27 DIAGNOSIS — Z9010 Acquired absence of unspecified breast and nipple: Secondary | ICD-10-CM | POA: Diagnosis not present

## 2013-01-27 DIAGNOSIS — I10 Essential (primary) hypertension: Secondary | ICD-10-CM | POA: Diagnosis not present

## 2013-01-27 LAB — BASIC METABOLIC PANEL
BUN: 10 mg/dL (ref 6–23)
CO2: 27 mEq/L (ref 19–32)
Calcium: 8.4 mg/dL (ref 8.4–10.5)
Glucose, Bld: 122 mg/dL — ABNORMAL HIGH (ref 70–99)
Sodium: 133 mEq/L — ABNORMAL LOW (ref 135–145)

## 2013-01-27 LAB — CBC WITH DIFFERENTIAL/PLATELET
Eosinophils Absolute: 0 10*3/uL (ref 0.0–0.7)
Eosinophils Relative: 1 % (ref 0–5)
HCT: 26.9 % — ABNORMAL LOW (ref 36.0–46.0)
Hemoglobin: 9.2 g/dL — ABNORMAL LOW (ref 12.0–15.0)
Lymphocytes Relative: 12 % (ref 12–46)
Lymphs Abs: 0.9 10*3/uL (ref 0.7–4.0)
MCH: 29.5 pg (ref 26.0–34.0)
MCV: 86.2 fL (ref 78.0–100.0)
Monocytes Relative: 8 % (ref 3–12)
Platelets: 266 10*3/uL (ref 150–400)
RBC: 3.12 MIL/uL — ABNORMAL LOW (ref 3.87–5.11)
WBC: 7.4 10*3/uL (ref 4.0–10.5)

## 2013-01-27 NOTE — Progress Notes (Signed)
Physical Therapy Treatment Patient Details Name: Gina Mcgee MRN: [id number] DOB: 11-28-1929 Today's Date: 01/27/2013 Time: 4098-1191 PT Time Calculation (min): 25 min  PT Assessment / Plan / Recommendation Comments on Treatment Session  Pt is moving well; continues to demo decreased balance and decreased safety awareness. Discussed again with pt that when she returns home, she will need to have her aide assist her with all mobility; pt verbalized understanding. Pt is a fall risk.     Follow Up Recommendations  Home health PT;Supervision/Assistance - 24 hour     Does the patient have the potential to tolerate intense rehabilitation     Barriers to Discharge        Equipment Recommendations  Wheelchair (measurements PT);Wheelchair cushion (measurements PT)    Recommendations for Other Services    Frequency Min 5X/week   Plan      Precautions / Restrictions Precautions Precautions: Fall Precaution Comments: provided with handout for sling wear, L UE postioning and gentle ROM for fingers and shoulder Required Braces or Orthoses: Other Brace/Splint Other Brace/Splint: L UE sling Restrictions Weight Bearing Restrictions: Yes LUE Weight Bearing: Non weight bearing   Pertinent Vitals/Pain 2/10 in L UE; pt states the pain "is not bad at all" in sling    Mobility  Bed Mobility Bed Mobility: Supine to Sit;Sitting - Scoot to [place] of Bed;Sit to Supine Supine to Sit: 5: Supervision Sitting - Scoot to Edge of Bed: 5: Supervision Sit to Supine: 5: Supervision Details for Bed Mobility Assistance: pt demo good technique; required min cues for safety with NWB status on L UE; increased time required to complete task Transfers Transfers: Sit to Stand;Stand to Sit Sit to Stand: 4: Min assist;From bed;With upper extremity assist Stand to Sit: 4: Min assist;To bed;With upper extremity assist Details for Transfer Assistance: assistance to steady pt and prevent falls; pt LEs has tendency to  buckle; demo decreased safety awareness; requires cues for safety Ambulation/Gait Ambulation/Gait Assistance: 4: Min assist Ambulation Distance (Feet): 50 Feet (x2) Assistive device: Straight cane Ambulation/Gait Assistance Details: required sitting rest break secondary to decreased strength in bil LEs and decreased conditioning. pt demo sway with amb and required assistance to maintain balance; pt with min LOB required steadying, unable to indpendently recover. pt will begin to demo flexed knees during gt with fatigue; unaware that she requires rest break; decreased safety awareness Gait Pattern: Decreased stride length;Shuffle;Trunk flexed;Narrow base of support Gait velocity: decreased  Stairs: No Wheelchair Mobility Wheelchair Mobility: No    Exercises General Exercises - Upper Extremity Digit Composite Flexion: AROM;Seated;15 reps Composite Extension: AROM;Seated;15 reps General Exercises - Lower Extremity Ankle Circles/Pumps: AROM;Both;Supine Long Arc Quad: AROM;Both;10 reps;Seated Hip Flexion/Marching: AROM;Both;10 reps;Seated Other Exercises Other Exercises: hip adduction/pillow squeezes x10    PT Diagnosis:    PT Problem List:   PT Treatment Interventions:     PT Goals Acute Rehab PT Goals PT Goal Formulation: With patient Time For Goal Achievement: 02/02/13 Potential to Achieve Goals: Fair PT Goal: Supine/Side to Sit - Progress: Progressing toward goal PT Goal: Sit to Supine/Side - Progress: Progressing toward goal PT Goal: Sit to Stand - Progress: Progressing toward goal PT Goal: Stand to Sit - Progress: Progressing toward goal PT Goal: Ambulate - Progress: Progressing toward goal  Visit Information  Last PT Received On: 01/27/13 Assistance Needed: +2 (to follow with chair only)    Subjective Data  Subjective: "I slept a good nap. We can walk as much as youd like now" Patient Stated Goal:  home with 24/7 help and husband    Cognition   Cognition Arousal/Alertness: Awake/alert Overall Cognitive Status: History of cognitive impairments - at baseline Area of Impairment: Safety/judgement;Memory Memory: Decreased recall of precautions Safety/Judgement: Decreased awareness of safety    Balance  Balance Balance Assessed: Yes Dynamic Standing Balance Dynamic Standing - Balance Support: Right upper extremity supported;During functional activity Dynamic Standing - Level of Assistance: 4: Min assist  End of Session PT - End of Session Equipment Utilized During Treatment: Gait belt;Other (comment) (L UE sling) Activity Tolerance: Patient tolerated treatment well Patient left: in bed;with call bell/phone within reach;with bed alarm set Nurse Communication: Mobility status   GP     Gina Mcgee ,  [phone]  01/27/2013, 3:04 PM

## 2013-01-27 NOTE — Progress Notes (Signed)
Subjective: 2 Days Post-Op Procedure(s) (LRB): OPEN REDUCTION INTERNAL FIXATION (ORIF) DISTAL HUMERUS FRACTURE REPAIR RECONSTRUCTION AND ULNA NERVE DECOMPRESSION AND ANTERIOR TRANSPOSITION AS NECESSARY (Left) Patient reports pain as mild.  She is alert and oriented and her husband is at the bedside. She has excellent radial nerve function. FPL EPL look excellent. She states her hand is doing fairly well. She is large amount of ecchymosis as expected. She denies abdominal pain neck back chest or who the lower extremity pain.   Objective: Vital signs in last 24 hours: Temp:  [98.8 F (37.1 C)-99.8 F (37.7 C)] 98.8 F (37.1 C) (05/08 1300) Pulse Rate:  [82-94] 94 (05/08 1300) Resp:  [16-18] 16 (05/08 1300) BP: (103-117)/(53-69) 111/69 mmHg (05/08 1300) SpO2:  [91 %-97 %] 95 % (05/08 1300)  Intake/Output from previous day: 05/07 0701 - 05/08 0700 In: -  Out: 50 [Drains:50] Intake/Output this shift:     Recent Labs  01/26/13 0520 01/27/13 0610  HGB 9.8* 9.2*    Recent Labs  01/26/13 0520 01/27/13 0610  WBC 7.4 7.4  RBC 3.28* 3.12*  HCT 28.4* 26.9*  PLT 271 266    Recent Labs  01/26/13 0520 01/27/13 0610  NA 133* 133*  K 2.9* 3.4*  CL 95* 96  CO2 29 27  BUN 12 10  CREATININE 0.67 0.69  GLUCOSE 125* 122*  CALCIUM 8.5 8.4   No results found for this basename: LABPT, INR,  in the last 72 hours  physical exam  ..The patient is alert and oriented in no acute distress the patient complains of pain in the affected upper extremity.  The patient is noted to have a normal HEENT exam.  Lung fields show equal chest expansion and no shortness of breath  abdomen exam is nontender without distention.  Lower extremity examination does not show any fracture dislocation or blood clot symptoms.  Pelvis is stable neck and back are stable and nontender ABD soft No cellulitis present Compartment soft  good refill  FPL EPL look excellent. She has excellent extension. Ulnar  nerve still has some decreased sensation as expected. She makes a better fist today. Overall she feels quite well since her function is improving. She is nontender on passive extension.  Assessment/Plan: 2 Days Post-Op Procedure(s) (LRB): OPEN REDUCTION INTERNAL FIXATION (ORIF) DISTAL HUMERUS FRACTURE REPAIR RECONSTRUCTION AND ULNA NERVE DECOMPRESSION AND ANTERIOR TRANSPOSITION AS NECESSARY (Left) Advance diet Up with therapy Patient Active Problem List   Diagnosis Date Noted  . Abnormal CXR 01/20/2013  . Preop cardiovascular exam 01/20/2013  . Surgery, elective 01/18/2013  . Fracture of pubic ramus 11/21/2011  . Hip pain, acute 11/20/2011  . Fall 11/20/2011  . Hyponatremia 11/20/2011  . Hypokalemia 11/20/2011  . Pelvic fracture 11/20/2011  . HTN (hypertension)   . Concussion   . History of breast cancer in female    we will continue the above-mentioned postoperative care status post ORIF supracondylar humerus fracture with olecranon osteotomy.  Possibly discharge tomorrow patient is doing well. Overall she looks very well she is alert oriented voiding well walking in the hallway and much improved in terms of her cognition.  Percent appreciate the help of the nursing staff and social services.  Karen Chafe 01/27/2013, 6:47 PM

## 2013-01-27 NOTE — Progress Notes (Signed)
Occupational Therapy Treatment Patient Details Name: Gina Mcgee MRN: [id number] DOB: 25-Jan-1930 Today's Date: 01/27/2013 Time: 4098-1191 OT Time Calculation (min): 42 min  OT Assessment / Plan / Recommendation Comments on Treatment Session Pt making progress and doing well, still needs cues for safety during ADL mobility. Pt is pleasant and cooperative and should continue with acute OT services to maximize level of function and safety    Follow Up Recommendations  Home health OT    Barriers to Discharge   None has personal care attendant    Equipment Recommendations  3 in 1 bedside comode    Recommendations for Other Services    Frequency Min 2X/week   Plan Discharge plan remains appropriate    Precautions / Restrictions Precautions Precautions: Fall Precaution Comments: provided with handout for sling wear, L UE postioning and gentle ROM for fingers and shoulder Required Braces or Orthoses: Other Brace/Splint Other Brace/Splint: L UE sling Restrictions Weight Bearing Restrictions: Yes LUE Weight Bearing: Non weight bearing   Pertinent Vitals/Pain 4/10 L UE pain    ADL  Grooming: Minimal assistance;Performed;Wash/dry hands;Wash/dry face Where Assessed - Grooming: Supported standing Upper Body Bathing: Simulated;Moderate assistance Where Assessed - Upper Body Bathing: Unsupported sitting Lower Body Bathing: Minimal assistance Upper Body Dressing: Performed;Minimal assistance Where Assessed - Upper Body Dressing: Unsupported sitting Lower Body Dressing: Minimal assistance Toilet Transfer: Performed;Minimal assistance Toilet Transfer Method: Sit to stand Toilet Transfer Equipment: Raised toilet seat with arms (or 3-in-1 over toilet);Grab bars Toileting - Clothing Manipulation and Hygiene: Performed;Min guard;Minimal assistance Where Assessed - Glass blower/designer Manipulation and Hygiene: Standing;Sit on 3-in-1 or toilet Equipment Used: Gait  belt;Cane Transfers/Ambulation Related to ADLs: verbal cues for safety    OT Diagnosis:    OT Problem List:   OT Treatment Interventions:     OT Goals ADL Goals ADL Goal: Grooming - Progress: Progressing toward goals ADL Goal: Upper Body Dressing - Progress: Progressing toward goals ADL Goal: Toilet Transfer - Progress: Progressing toward goals ADL Goal: Toileting - Clothing Manipulation - Progress: Progressing toward goals ADL Goal: Toileting - Hygiene - Progress: Progressing toward goals Arm Goals Arm Goal: Additional Goal #1 - Progress: Progressing toward goals Arm Goal: Additional Goal #2 - Progress: Progressing toward goals  Visit Information  Last OT Received On: 01/27/13 Assistance Needed: +1    Subjective Data  Subjective: " I have so much swelling in my hand " Patient Stated Goal: To return home   Prior Functioning       Cognition  Cognition Arousal/Alertness: Awake/alert Overall Cognitive Status: History of cognitive impairments - at baseline Area of Impairment: Safety/judgement;Memory Memory: Decreased recall of precautions Safety/Judgement: Decreased awareness of safety    Mobility  Bed Mobility Bed Mobility: Not assessed Transfers Transfers: Sit to Stand;Stand to Sit Sit to Stand: 4: Min assist;From chair/3-in-1 Stand to Sit: To chair/3-in-1;4: Min assist    Exercises  General Exercises - Upper Extremity Digit Composite Flexion: AROM;Seated;15 reps Composite Extension: AROM;Seated;15 reps Other Exercises Other Exercises: Reviewed proper positioning of l UE, sling wear education (provided with handout)   Balance Balance Balance Assessed: Yes Dynamic Standing Balance Dynamic Standing - Balance Support: During functional activity;Left upper extremity supported Dynamic Standing - Level of Assistance: 4: Min assist   End of Session OT - End of Session Equipment Utilized During Treatment: Other (comment);Gait belt (cane, 3 in 1 over toilet) Activity  Tolerance: Patient tolerated treatment well Patient left: in chair;with call bell/phone within reach;with family/visitor present  GO Functional Limitation: Self care  Self Care Current Status [phone]): At least 40 percent but less than 60 percent impaired, limited or restricted Self Care Goal Status ([id number]): At least 1 percent but less than 20 percent impaired, limited or restricted   Galen Manila 01/27/2013, 12:47 PM

## 2013-01-27 NOTE — Progress Notes (Signed)
OT EVALUATION - late entry   01/26/13 1700  OT Visit Information  Last OT Received On 01/26/13  Assistance Needed +1  OT Time Calculation  OT Start Time 1430  OT Stop Time 1501  OT Time Calculation (min) 31 min  Precautions  Precautions Fall  Precaution Comments pt has history of falls   Required Braces or Orthoses Other Brace/Splint (L UE splint)  Other Brace/Splint L UE sling  Restrictions  Weight Bearing Restrictions Yes  LUE Weight Bearing NWB  Home Living  Lives With Spouse;Other (Comment) (has home aide 24/7)  Available Help at Discharge Personal care attendant;Available 24 hours/day  Type of Home House  Home Access Level entry  Home Layout One level  Bathroom Shower/Tub Tub/shower unit  Bathroom Toilet Handicapped height  Bathroom Accessibility Yes  How Accessible Accessible via wheelchair;Accessible via walker  Home Adaptive Equipment Walker - standard  Additional Comments husband has equipment but will need to use it   Prior Function  Level of Independence Independent (Prior to fall 2 weeks ago)  Able to Take Stairs? Yes  Driving No  Communication  Communication No difficulties  ADL  Eating/Feeding Set up  Where Assessed - Eating/Feeding Chair  Grooming Moderate assistance  Where Assessed - Grooming Supported sitting  Upper Body Bathing Moderate assistance  Where Assessed - Upper Body Bathing Unsupported sitting  Lower Body Bathing Minimal assistance  Where Assessed - Lower Body Bathing Supported sit to stand  Upper Body Dressing Moderate assistance  Where Assessed - Upper Body Dressing Unsupported sitting  Lower Body Dressing Minimal assistance  Where Assessed - Lower Body Dressing Supported sit to stand  Toilet Transfer Moderate assistance  Toilet Transfer Method Stand pivot  Musician - Clothing Manipulation and Hygiene Minimal assistance  Where Assessed - Engineer, mining and Hygiene Sit to  stand from 3-in-1 or toilet  Equipment Used Gait belt;Cane  Transfers/Ambulation Related to ADLs Mod A. using [place]  ADL Comments impulsive. unsafe during ADL. Poor balance  Vision - History  Baseline Vision Wears glasses all the time  Cognition  Arousal/Alertness Awake/alert  Behavior During Therapy Impulsive  Overall Cognitive Status History of cognitive impairments - at baseline  Area of Impairment Safety/judgement;Memory  Memory Decreased recall of precautions  Safety/Judgement Decreased awareness of safety  General Comments Impulsive. repeating self throughout session. Impulsive.  Right Upper Extremity Assessment  RUE ROM/Strength/Tone WFL for tasks assessed  RUE Sensation WFL - Light Touch;WFL - Proprioception  RUE Coordination WFL - gross/fine motor  Left Upper Extremity Assessment  LUE ROM/Strength/Tone Deficits  LUE ROM/Strength/Tone Deficits casted @ 80 elbow flex.   LUE Sensation WFL - Light Touch  Right Lower Extremity Assessment  RLE ROM/Strength/Tone WFL for tasks assessed  Left Lower Extremity Assessment  LLE ROM/Strength/Tone WFL for tasks assessed  Trunk Assessment  Trunk Assessment Kyphotic  Bed Mobility  Bed Mobility Supine to Sit;Sitting - Scoot to Edge of Bed  Supine to Sit 4: Min assist;HOB flat  Sitting - Scoot to [place] of Bed 4: Min assist  Transfers  Sit to Stand 4: Min assist;From bed  Stand to Sit 4: Min assist;To chair/3-in-1  Details for Transfer Assistance mod A for transfer due to impulsivity and poor judgement  Balance  Balance Assessed Yes  Static Sitting Balance  Static Sitting - Balance Support Bilateral upper extremity supported;Feet supported  Static Sitting - Level of Assistance 5: Stand by assistance  Static Sitting - Comment/# of Minutes posterior lean  OT - End of Session  Equipment Utilized During Treatment Gait belt  Activity Tolerance Patient tolerated treatment well  Patient left in chair;with call bell/phone within reach;with  family/visitor present  Nurse Communication Mobility status;Precautions  OT Assessment  Clinical Impression Statement 79 yo with hx of multiple falls. Pt s/p fall with resulting L comminuted elbow fracture. ORIF. NWB. PTA, pt lived with husband and had caregivers, who mainly assisted her husband. Pt plans to D/C home and will need 24/7 assistance after D/C. Discussed this with pt/husband and caregiver. Family states this can be arranged. Pt will benefit from skilled OT services to facilitate D/C to next venue due to below deficits.  OT Recommendation/Assessment Patient needs continued OT Services  OT Problem List Decreased strength;Decreased range of motion;Decreased activity tolerance;Impaired balance (sitting and/or standing);Decreased coordination;Decreased knowledge of use of DME or AE;Decreased knowledge of precautions;Impaired UE functional use;Pain;Increased edema  Barriers to Discharge None  OT Therapy Diagnosis  Generalized weakness;Acute pain  OT Plan  OT Frequency Min 2X/week  OT Recommendation  Follow Up Recommendations Home health OT  OT Equipment 3 in 1 bedside comode  Individuals Consulted  Consulted and Agree with Results and Recommendations Family member/caregiver;Patient  Family Member Consulted husband  Acute Rehab OT Goals  OT Goal Formulation With patient  Time For Goal Achievement 02/10/13  Potential to Achieve Goals Good  ADL Goals  Pt Will Perform Grooming with set-up;with supervision;Standing at sink;Unsupported;with cueing (comment type and amount)  Pt Will Transfer to Toilet with supervision;Ambulation;with DME;3-in-1  Pt Will Perform Toileting - Clothing Manipulation with supervision;Standing  Pt Will Perform Toileting - Hygiene with supervision;with caregiver independent in assisting;Standing at 3-in-1/toilet  Pt Will Perform Upper Body Dressing with caregiver independent in assisting;with min assist;Sitting, chair;Unsupported  ADL Goal: Grooming - Progress Goal  set today  ADL Goal: Upper Body Dressing - Progress Goal set today  ADL Goal: Toilet Transfer - Progress Goal set today  ADL Goal: Toileting - Clothing Manipulation - Progress Goal set today  ADL Goal: Toileting - Hygiene - Progress Goal set today  Arm Goals  Additional Arm Goal #1 Pt/caregiver will be independent with donning/doffning sling for LUE.  Arm Goal: Additional Goal #1 - Progress Goal set today  Arm Goal: Additional Goal #2 - Progress Goal set today  Additional Arm Goal #2 Pt/caregiver will independently verbalize precautions, positioning and edema control for LUE.  OT G-codes **NOT FOR INPATIENT CLASS**  Functional Assessment Tool Used clinical judgement  Functional Limitation Self care  Self Care Current Status [phone]) CK  Self Care Goal Status (U0454) CI  Written Expression  Dominant Hand Right  [facility], OTR/L  [phone] 01/27/2013

## 2013-01-27 NOTE — Progress Notes (Signed)
OT EVALUATION - late entry   01/26/13 1700  OT Visit Information  Last OT Received On 01/26/13  Assistance Needed +1  OT Time Calculation  OT Start Time 1430  OT Stop Time 1501  OT Time Calculation (min) 31 min  Precautions  Precautions Fall  Precaution Comments pt has history of falls   Required Braces or Orthoses Other Brace/Splint (L UE splint)  Other Brace/Splint L UE sling  Restrictions  Weight Bearing Restrictions Yes  LUE Weight Bearing NWB  Home Living  Lives With Spouse;Other (Comment) (has home aide 24/7)  Available Help at Discharge Personal care attendant;Available 24 hours/day  Type of Home House  Home Access Level entry  Home Layout One level  Bathroom Shower/Tub Tub/shower unit  Bathroom Toilet Handicapped height  Bathroom Accessibility Yes  How Accessible Accessible via wheelchair;Accessible via walker  Home Adaptive Equipment Walker - standard  Additional Comments husband has equipment but will need to use it   Prior Function  Level of Independence Independent (Prior to fall 2 weeks ago)  Able to Take Stairs? Yes  Driving No  Communication  Communication No difficulties  ADL  Eating/Feeding Set up  Where Assessed - Eating/Feeding Chair  Grooming Moderate assistance  Where Assessed - Grooming Supported sitting  Upper Body Bathing Moderate assistance  Where Assessed - Upper Body Bathing Unsupported sitting  Lower Body Bathing Minimal assistance  Where Assessed - Lower Body Bathing Supported sit to stand  Upper Body Dressing Moderate assistance  Where Assessed - Upper Body Dressing Unsupported sitting  Lower Body Dressing Minimal assistance  Where Assessed - Lower Body Dressing Supported sit to stand  Toilet Transfer Moderate assistance  Toilet Transfer Method Stand pivot  Musician - Clothing Manipulation and Hygiene Minimal assistance  Where Assessed - Engineer, mining and Hygiene Sit to  stand from 3-in-1 or toilet  Equipment Used Gait belt;Cane  Transfers/Ambulation Related to ADLs Mod A. using [place]  ADL Comments impulsive. unsafe during ADL. Poor balance  Vision - History  Baseline Vision Wears glasses all the time  Cognition  Arousal/Alertness Awake/alert  Behavior During Therapy Impulsive  Overall Cognitive Status History of cognitive impairments - at baseline  Area of Impairment Safety/judgement;Memory  Memory Decreased recall of precautions  Safety/Judgement Decreased awareness of safety  General Comments Impulsive. repeating self throughout session. Impulsive.  Right Upper Extremity Assessment  RUE ROM/Strength/Tone WFL for tasks assessed  RUE Sensation WFL - Light Touch;WFL - Proprioception  RUE Coordination WFL - gross/fine motor  Left Upper Extremity Assessment  LUE ROM/Strength/Tone Deficits  LUE ROM/Strength/Tone Deficits casted @ 80 elbow flex.   LUE Sensation WFL - Light Touch  Right Lower Extremity Assessment  RLE ROM/Strength/Tone WFL for tasks assessed  Left Lower Extremity Assessment  LLE ROM/Strength/Tone WFL for tasks assessed  Trunk Assessment  Trunk Assessment Kyphotic  Bed Mobility  Bed Mobility Supine to Sit;Sitting - Scoot to Edge of Bed  Supine to Sit 4: Min assist;HOB flat  Sitting - Scoot to [place] of Bed 4: Min assist  Transfers  Sit to Stand 4: Min assist;From bed  Stand to Sit 4: Min assist;To chair/3-in-1  Details for Transfer Assistance mod A for transfer due to impulsivity and poor judgement  Balance  Balance Assessed Yes  Static Sitting Balance  Static Sitting - Balance Support Bilateral upper extremity supported;Feet supported  Static Sitting - Level of Assistance 5: Stand by assistance  Static Sitting - Comment/# of Minutes posterior lean  OT - End of Session  Equipment Utilized During Treatment Gait belt  Activity Tolerance Patient tolerated treatment well  Patient left in chair;with call bell/phone within reach;with  family/visitor present  Nurse Communication Mobility status;Precautions  OT Assessment  Clinical Impression Statement 79 yo with hx of multiple falls. Pt s/p fall with resulting L comminuted elbow fracture. ORIF. NWB. PTA, pt lived with husband and had caregivers, who mainly assisted her husband. Pt plans to D/C home and will need 24/7 assistance after D/C. Discussed this with pt/husband and caregiver. Family states this can be arranged.   OT Recommendation/Assessment Patient needs continued OT Services  OT Problem List Decreased strength;Decreased range of motion;Decreased activity tolerance;Impaired balance (sitting and/or standing);Decreased coordination;Decreased knowledge of use of DME or AE;Decreased knowledge of precautions;Impaired UE functional use;Pain;Increased edema  Barriers to Discharge None  OT Therapy Diagnosis  Generalized weakness;Acute pain  OT Plan  OT Frequency Min 2X/week  OT Recommendation  Follow Up Recommendations Home health OT  OT Equipment 3 in 1 bedside comode  Individuals Consulted  Consulted and Agree with Results and Recommendations Family member/caregiver;Patient  Family Member Consulted husband  Acute Rehab OT Goals  OT Goal Formulation With patient  Time For Goal Achievement 02/10/13  Potential to Achieve Goals Good  ADL Goals  Pt Will Perform Grooming with set-up;with supervision;Standing at sink;Unsupported;with cueing (comment type and amount)  Pt Will Transfer to Toilet with supervision;Ambulation;with DME;3-in-1  Pt Will Perform Toileting - Clothing Manipulation with supervision;Standing  Pt Will Perform Toileting - Hygiene with supervision;with caregiver independent in assisting;Standing at 3-in-1/toilet  Pt Will Perform Upper Body Dressing with caregiver independent in assisting;with min assist;Sitting, chair;Unsupported  ADL Goal: Grooming - Progress Goal set today  ADL Goal: Upper Body Dressing - Progress Goal set today  ADL Goal: Toilet  Transfer - Progress Goal set today  ADL Goal: Toileting - Clothing Manipulation - Progress Goal set today  ADL Goal: Toileting - Hygiene - Progress Goal set today  Arm Goals  Additional Arm Goal #1 Pt/caregiver will be independent with donning/doffning sling for LUE.  Arm Goal: Additional Goal #1 - Progress Goal set today  Arm Goal: Additional Goal #2 - Progress Goal set today  Additional Arm Goal #2 Pt/caregiver will independently verbalize precautions, positioning and edema control for LUE.  Written Expression  Dominant Hand Right  Gina Mcgee, OTR/L  [phone] 02/14/2013

## 2013-01-28 DIAGNOSIS — I10 Essential (primary) hypertension: Secondary | ICD-10-CM | POA: Diagnosis not present

## 2013-01-28 DIAGNOSIS — Z853 Personal history of malignant neoplasm of breast: Secondary | ICD-10-CM | POA: Diagnosis not present

## 2013-01-28 DIAGNOSIS — IMO0002 Reserved for concepts with insufficient information to code with codable children: Secondary | ICD-10-CM | POA: Diagnosis not present

## 2013-01-28 DIAGNOSIS — Z9010 Acquired absence of unspecified breast and nipple: Secondary | ICD-10-CM | POA: Diagnosis not present

## 2013-01-28 DIAGNOSIS — R918 Other nonspecific abnormal finding of lung field: Secondary | ICD-10-CM | POA: Diagnosis not present

## 2013-01-28 DIAGNOSIS — M810 Age-related osteoporosis without current pathological fracture: Secondary | ICD-10-CM | POA: Diagnosis not present

## 2013-01-28 MED ORDER — OXYCODONE HCL 5 MG PO TABS: 5.0000 mg | ORAL_TABLET | ORAL | Status: DC | PRN

## 2013-01-28 NOTE — Care Management Note (Signed)
CARE MANAGEMENT NOTE 01/28/2013  Patient:  Gina Mcgee, Gina Mcgee   Account Number:  [id number]  Date Initiated:  01/28/2013  Documentation initiated by:  Vance Peper  Subjective/Objective Assessment:   79 yr old female s/p left humerus ORIF     Action/Plan:   CM spoke with patient concerning home health needs. Choice offered. [facility] wants [facility] and requests Meriam Sprague and Rosanne Ashing as her therapist.Referral called to Fernand Parkins with [facility]. Patient will also have assistance from [facility].   Anticipated DC Date:  01/28/2013   Anticipated DC Plan:  HOME W HOME HEALTH SERVICES      DC Planning Services  CM consult      [facility] Choice  HOME HEALTH   Choice offered to / List presented to:  C-1 Patient        HH arranged  HH-2 PT  HH-3 OT      [facility] agency  [facility]   Status of service:  Completed, signed off Medicare Important Message given?   (If response is "NO", the following Medicare IM given date fields will be blank) Date Medicare IM given:   Date Additional Medicare IM given:    Discharge Disposition:  HOME W HOME HEALTH SERVICES  Per UR Regulation:    If discussed at Long Length of Stay Meetings, dates discussed:    Comments:

## 2013-01-28 NOTE — Progress Notes (Signed)
D/C instructions and scripts given. Pt verbalized understanding of instructions. pts caregiver at bedside to take her home.

## 2013-01-28 NOTE — Progress Notes (Signed)
Physical Therapy Treatment Patient Details Name: Gina Mcgee MRN: [id number] DOB: Dec 28, 1929 Today's Date: 01/28/2013 Time: 7829-5621 PT Time Calculation (min): 26 min  PT Assessment / Plan / Recommendation Comments on Treatment Session  Patient continues to move well. She is impulsive with decreased safety awareness. When educated not to get up and walk without assistance patient made a small smile and laughed. Patient does have aide at home that will assist with mobility    Follow Up Recommendations  Home health PT;Supervision/Assistance - 24 hour     Does the patient have the potential to tolerate intense rehabilitation     Barriers to Discharge        Equipment Recommendations  Wheelchair (measurements PT);Wheelchair cushion (measurements PT)    Recommendations for Other Services    Frequency Min 5X/week   Plan Discharge plan remains appropriate;Frequency remains appropriate    Precautions / Restrictions Precautions Precautions: Fall Required Braces or Orthoses: Other Brace/Splint Other Brace/Splint: L UE sling. Patient states that sling hurts with ambulation dues to straps around neck and heavy splint. Patient kept her arm up with ambulation Restrictions Weight Bearing Restrictions: Yes LUE Weight Bearing: Non weight bearing   Pertinent Vitals/Pain 2/10 L elbow pain.     Mobility  Bed Mobility Bed Mobility: Not assessed Transfers Sit to Stand: 4: Min guard;With upper extremity assist;From chair/3-in-1 Stand to Sit: 4: Min guard;With upper extremity assist;With armrests;To chair/3-in-1 Details for Transfer Assistance: Pt needed min instructional cueing for hand placement with sit to stand. Ambulation/Gait Ambulation/Gait Assistance: 4: Min assist Ambulation Distance (Feet): 200 Feet Assistive device: Straight cane Ambulation/Gait Assistance Details: Patient distracts herself with ambulation making balance more difficult. Cues to focus on ambulaton. Patient swaying  needing Min A  Gait Pattern: Decreased stride length;Shuffle;Trunk flexed;Narrow base of support Gait velocity: decreased     Exercises     PT Diagnosis:    PT Problem List:   PT Treatment Interventions:     PT Goals Acute Rehab PT Goals PT Goal: Sit to Stand - Progress: Progressing toward goal PT Goal: Stand to Sit - Progress: Progressing toward goal PT Goal: Ambulate - Progress: Progressing toward goal  Visit Information  Last PT Received On: 01/28/13 Assistance Needed: +1    Subjective Data      Cognition  Cognition Arousal/Alertness: Awake/alert Overall Cognitive Status: History of cognitive impairments - at baseline Area of Impairment: Safety/judgement;Memory General Comments: Pt reporting wanting to use the walker with just her right arm as she was doing before she had the surgery.    Balance  Balance Balance Assessed: Yes Dynamic Standing Balance Dynamic Standing - Balance Support: Right upper extremity supported Dynamic Standing - Level of Assistance: 4: Min assist  End of Session PT - End of Session Equipment Utilized During Treatment: Gait belt;Other (comment) Activity Tolerance: Patient tolerated treatment well Patient left: in chair;with call bell/phone within reach Nurse Communication: Mobility status   GP     Fredrich Birks 01/28/2013, 11:40 AM  01/28/2013 Fredrich Birks PTA [phone] pager [phone] office

## 2013-01-28 NOTE — Discharge Summary (Signed)
Physician Discharge Summary  Patient ID: Gina Mcgee MRN: [id number] DOB/AGE: [birth year] 79 y.o.  Admit date: 01/25/2013 Discharge date: 01/28/2013  Admission Diagnoses: Left distal humerus fracture displaced  Hip pain, acute Fall HTN (hypertension) Concussion History of breast cancer in female Hyponatremia Hypokalemia Pelvic fracture Fracture of pubic ramus   Discharge Diagnoses: Status post ORIF left distal humerus fracture Patient Active Problem List   Diagnosis Date Noted  . Abnormal CXR 01/20/2013  . Preop cardiovascular exam 01/20/2013  . Surgery, elective 01/18/2013  . Fracture of pubic ramus 11/21/2011  . Hip pain, acute 11/20/2011  . Fall 11/20/2011  . Hyponatremia 11/20/2011  . Hypokalemia 11/20/2011  . Pelvic fracture 11/20/2011  . HTN (hypertension)   . Concussion   . History of breast cancer in female      Discharged Condition: Improved   Hospital Course: the patient is a very pleasant 79 year old female presented to our office setting for evaluation of her left upper extremity after a fall she sustained. She unfortunately sustained a comminuted distal humerus fracture. We discussed with her given her radiographic findings, independent living status , and fracture pattern at recommendation for surgical intervention . 01/25/2013 she was admitted to the hospital to undergo open reduction internal fixation of the left upper extremity. The patient does have a remote history of a possible CVA as well as left breast mastectomy with lymph node dissection and radiation treatment was subsequent weakness and paresthesias of the left upper extremity. We discussed all issues with her at length. She underwent open reduction internal fixation of the left upper extremity without complications, please see operative report for full details.   on postop day #1 she was doing well overall her pain was controlled. Discussed with her the need for therapeutic endeavors as well his range of  motion of the digits and elevation of the upper extremity. Her vital signs were stable and she was afebrile. She progressed accordingly throughout her postoperative period without any complications he stay she continued to improve in regards to her pain and on postop day #3 she was doing quite well taking minimal pain medications. She was tolerating a regular diet, voiding without difficulties, and was noted to have a bowel movement without difficulties. Overall she felt well and confident for discharge home. We discussed with her the need for home home health/physical therapy. Care management was consult and home health care was arranged. Discussed with her the need for diligent elevation and protect the upper extremities was keep splint clean and dry. Discussed all discharge issues with her at the time of her discharge  Consults: case management    Treatments: See operative report for full   Discharge Exam: Blood pressure 101/81, pulse 86, temperature 98.7 F (37.1 C), temperature source Oral, resp. rate 18, height 5' (1.524 m), weight 48.988 kg (108 lb), SpO2 96.00%. Marland Kitchen.Patient presents for evaluation and treatment of the of their upper extremity predicament. The patient denies neck back chest or of abdominal pain. The patient notes that they have no lower extremity problems. The patient from primarily complains of the upper extremity pain noted.  evaluation of the left upper extremity shows that she has ecchymosis about the digits and dorsal hand with moderate edema. I spent a great deal of time at bedside with her today elevating the hand massaging the digits and performing passive range of motion and having her demonstrate active range of motion. Neurovascularlyis stable and no signs of infection Splint is clean dry and intact disposition: 01-Home  or Self Care  Discharge Orders   Future Orders Complete By Expires     Call MD / Call 911  As directed     Comments:      If you experience chest  pain or shortness of breath, CALL 911 and be transported to the hospital emergency room.  If you develope a fever above 101 F, pus (white drainage) or increased drainage or redness at the wound, or calf pain, call your surgeon's office.    Constipation Prevention  As directed     Comments:      Drink plenty of fluids.  Prune juice may be helpful.  You may use a stool softener, such as Colace (over the counter) 100 mg twice a day.  Use MiraLax (over the counter) for constipation as needed.    Diet - low sodium heart healthy  As directed     Discharge instructions  As directed     Comments:      Marland KitchenMarland KitchenKeep bandage clean and dry.  Call for any problems.  No smoking.  Criteria for driving a car: you should be off your pain medicine for 7-8 hours, able to drive one handed(confident), thinking clearly and feeling able in your judgement to drive. Continue elevation as it will decrease swelling.  If instructed by MD move your fingers within the confines of the bandage/splint.  Use ice if instructed by your MD. Call immediately for any sudden loss of feeling in your hand/arm or change in functional abilities of the extremity. Marland Kitchen.We recommend that you to take vitamin C 1000 mg a day to promote healing we also recommend that if you require her pain medicine that he take a stool softener to prevent constipation as most pain medicines will have constipation side effects. We recommend either Peri-Colace or Senokot and recommend that you also consider adding MiraLAX to prevent the constipation affects from pain medicine if you are required to use them. These medicines are over the counter and maybe purchased at a local pharmacy.    Increase activity slowly as tolerated  As directed         Medication List    STOP taking these medications       HYDROcodone-acetaminophen 5-325 MG per tablet  Commonly known as:  NORCO      TAKE these medications       beta carotene w/minerals tablet  Take 1 tablet by mouth daily.      CALCIUM 500 + D PO  Take 1 tablet by mouth 2 (two) times daily.     oxyCODONE 5 MG immediate release tablet  Commonly known as:  Oxy IR/ROXICODONE  Take 1 tablet (5 mg total) by mouth every 4 (four) hours as needed.     triamterene-hydrochlorothiazide 37.5-25 MG per capsule  Commonly known as:  DYAZIDE  Take 1 capsule by mouth every morning.     Vitamin D3 1000 UNITS Caps  Take by mouth.     vitamin D (CHOLECALCIFEROL) 400 UNITS tablet  Take 400 Units by mouth daily.           Follow-up Information   Follow up with Karen Chafe, MD. Schedule an appointment as soon as possible for a visit in 8 days.   Contact information:   [address] [place] [address] [phone]       Signed: Sheran Lawless 01/28/2013, 4:56 PM

## 2013-01-28 NOTE — Progress Notes (Signed)
Occupational Therapy Treatment Patient Details Name: Gina Mcgee MRN: [id number] DOB: 08-Dec-1929 Today's Date: 01/28/2013 Time: 4098-1191 OT Time Calculation (min): 25 min  OT Assessment / Plan / Recommendation Comments on Treatment Session Pt pleasant and looking forward to going home soon today.  Currently still with approxmately  60% of full finger flexion and 90% of full digit extension.  Will need 24 hour supervision for safety secondary to high fall risk and need for assistance with ADLs.    Follow Up Recommendations  Home health OT       Equipment Recommendations  3 in 1 bedside comode       Frequency Min 2X/week   Plan Discharge plan remains appropriate    Precautions / Restrictions Precautions Precautions: Fall Required Braces or Orthoses: Other Brace/Splint Other Brace/Splint: L UE sling Restrictions Weight Bearing Restrictions: Yes LUE Weight Bearing: Non weight bearing   Pertinent Vitals/Pain Pt with no report of pain when asked    ADL  Grooming: Simulated;Minimal assistance Where Assessed - Grooming: Supported standing Toilet Transfer: Simulated;Min Pension scheme manager Method: Other (comment) (ambulate with single point cane) Acupuncturist: Comfort height toilet Toileting - Clothing Manipulation and Hygiene: Simulated;Min guard Where Assessed - Toileting Clothing Manipulation and Hygiene: Sit to stand from 3-in-1 or toilet Transfers/Ambulation Related to ADLs: Pt overall min assist for mobility using the single point cane in the RUE.  ADL Comments: Pt performed AAROM exercises 1 set of 20 repetions for shoulder flexion and digit flexion/extension.  Pt still with increased swelling in digits and dorsum of hand but improved per her report.       OT Goals ADL Goals ADL Goal: Grooming - Progress: Progressing toward goals ADL Goal: Toilet Transfer - Progress: Progressing toward goals Arm Goals Arm Goal: Additional Goal #1 - Progress: Progressing  toward goals Arm Goal: Additional Goal #2 - Progress: Progressing toward goals  Visit Information  Last OT Received On: 01/28/13 Assistance Needed: +1    Subjective Data  Subjective: My hand looks better than it did yesterday. Patient Stated Goal: Pt did not state during session.      Cognition  Cognition Arousal/Alertness: Awake/alert Overall Cognitive Status: History of cognitive impairments - at baseline Area of Impairment: Safety/judgement;Memory General Comments: Pt reporting wanting to use the walker with just her right arm as she was doing before she had the surgery.    Mobility  Transfers Transfers: Sit to Stand Sit to Stand: 4: Min guard;With upper extremity assist;From chair/3-in-1 Stand to Sit: 4: Min guard;With upper extremity assist;With armrests;To chair/3-in-1 Details for Transfer Assistance: Pt needed min instructional cueing for hand placement with sit to stand.       Balance Balance Balance Assessed: Yes Dynamic Standing Balance Dynamic Standing - Balance Support: Right upper extremity supported Dynamic Standing - Level of Assistance: 4: Min assist   End of Session OT - End of Session Activity Tolerance: Patient tolerated treatment well Patient left: with call bell/phone within reach;in chair Nurse Communication: Mobility status  GO Functional Assessment Tool Used: clinical judgement Self Care Current Status (Y7829): At least 40 percent but less than 60 percent impaired, limited or restricted Self Care Discharge Status [id number]): At least 40 percent but less than 60 percent impaired, limited or restricted   Caileigh Canche OTR/L Pager number [phone] 01/28/2013, 11:06 AM

## 2013-01-29 DIAGNOSIS — S52599A Other fractures of lower end of unspecified radius, initial encounter for closed fracture: Secondary | ICD-10-CM | POA: Diagnosis not present

## 2013-01-29 DIAGNOSIS — S42309D Unspecified fracture of shaft of humerus, unspecified arm, subsequent encounter for fracture with routine healing: Secondary | ICD-10-CM | POA: Diagnosis not present

## 2013-01-29 DIAGNOSIS — Z4789 Encounter for other orthopedic aftercare: Secondary | ICD-10-CM | POA: Diagnosis not present

## 2013-01-29 DIAGNOSIS — M6281 Muscle weakness (generalized): Secondary | ICD-10-CM | POA: Diagnosis not present

## 2013-01-29 DIAGNOSIS — R269 Unspecified abnormalities of gait and mobility: Secondary | ICD-10-CM | POA: Diagnosis not present

## 2013-01-29 DIAGNOSIS — Z5189 Encounter for other specified aftercare: Secondary | ICD-10-CM | POA: Diagnosis not present

## 2013-01-29 DIAGNOSIS — I10 Essential (primary) hypertension: Secondary | ICD-10-CM | POA: Diagnosis not present

## 2013-01-31 DIAGNOSIS — R269 Unspecified abnormalities of gait and mobility: Secondary | ICD-10-CM | POA: Diagnosis not present

## 2013-01-31 DIAGNOSIS — M6281 Muscle weakness (generalized): Secondary | ICD-10-CM | POA: Diagnosis not present

## 2013-01-31 DIAGNOSIS — I10 Essential (primary) hypertension: Secondary | ICD-10-CM | POA: Diagnosis not present

## 2013-01-31 DIAGNOSIS — S42309D Unspecified fracture of shaft of humerus, unspecified arm, subsequent encounter for fracture with routine healing: Secondary | ICD-10-CM | POA: Diagnosis not present

## 2013-01-31 DIAGNOSIS — Z5189 Encounter for other specified aftercare: Secondary | ICD-10-CM | POA: Diagnosis not present

## 2013-02-01 DIAGNOSIS — S42309D Unspecified fracture of shaft of humerus, unspecified arm, subsequent encounter for fracture with routine healing: Secondary | ICD-10-CM | POA: Diagnosis not present

## 2013-02-01 DIAGNOSIS — I10 Essential (primary) hypertension: Secondary | ICD-10-CM | POA: Diagnosis not present

## 2013-02-01 DIAGNOSIS — Z5189 Encounter for other specified aftercare: Secondary | ICD-10-CM | POA: Diagnosis not present

## 2013-02-01 DIAGNOSIS — R269 Unspecified abnormalities of gait and mobility: Secondary | ICD-10-CM | POA: Diagnosis not present

## 2013-02-01 DIAGNOSIS — M6281 Muscle weakness (generalized): Secondary | ICD-10-CM | POA: Diagnosis not present

## 2013-02-02 DIAGNOSIS — S52599A Other fractures of lower end of unspecified radius, initial encounter for closed fracture: Secondary | ICD-10-CM | POA: Diagnosis not present

## 2013-02-02 DIAGNOSIS — Z4789 Encounter for other orthopedic aftercare: Secondary | ICD-10-CM | POA: Diagnosis not present

## 2013-02-03 DIAGNOSIS — R269 Unspecified abnormalities of gait and mobility: Secondary | ICD-10-CM | POA: Diagnosis not present

## 2013-02-03 DIAGNOSIS — I10 Essential (primary) hypertension: Secondary | ICD-10-CM | POA: Diagnosis not present

## 2013-02-03 DIAGNOSIS — M6281 Muscle weakness (generalized): Secondary | ICD-10-CM | POA: Diagnosis not present

## 2013-02-03 DIAGNOSIS — Z5189 Encounter for other specified aftercare: Secondary | ICD-10-CM | POA: Diagnosis not present

## 2013-02-03 DIAGNOSIS — S42309D Unspecified fracture of shaft of humerus, unspecified arm, subsequent encounter for fracture with routine healing: Secondary | ICD-10-CM | POA: Diagnosis not present

## 2013-02-08 DIAGNOSIS — Z5189 Encounter for other specified aftercare: Secondary | ICD-10-CM | POA: Diagnosis not present

## 2013-02-08 DIAGNOSIS — I10 Essential (primary) hypertension: Secondary | ICD-10-CM | POA: Diagnosis not present

## 2013-02-08 DIAGNOSIS — R269 Unspecified abnormalities of gait and mobility: Secondary | ICD-10-CM | POA: Diagnosis not present

## 2013-02-08 DIAGNOSIS — M6281 Muscle weakness (generalized): Secondary | ICD-10-CM | POA: Diagnosis not present

## 2013-02-08 DIAGNOSIS — S42309D Unspecified fracture of shaft of humerus, unspecified arm, subsequent encounter for fracture with routine healing: Secondary | ICD-10-CM | POA: Diagnosis not present

## 2013-02-09 DIAGNOSIS — IMO0001 Reserved for inherently not codable concepts without codable children: Secondary | ICD-10-CM | POA: Diagnosis not present

## 2013-02-10 DIAGNOSIS — R269 Unspecified abnormalities of gait and mobility: Secondary | ICD-10-CM | POA: Diagnosis not present

## 2013-02-10 DIAGNOSIS — I10 Essential (primary) hypertension: Secondary | ICD-10-CM | POA: Diagnosis not present

## 2013-02-10 DIAGNOSIS — S42309D Unspecified fracture of shaft of humerus, unspecified arm, subsequent encounter for fracture with routine healing: Secondary | ICD-10-CM | POA: Diagnosis not present

## 2013-02-10 DIAGNOSIS — Z5189 Encounter for other specified aftercare: Secondary | ICD-10-CM | POA: Diagnosis not present

## 2013-02-10 DIAGNOSIS — M6281 Muscle weakness (generalized): Secondary | ICD-10-CM | POA: Diagnosis not present

## 2013-02-15 ENCOUNTER — Ambulatory Visit: Payer: Medicare Other | Admitting: Cardiovascular Disease

## 2013-02-15 DIAGNOSIS — R269 Unspecified abnormalities of gait and mobility: Secondary | ICD-10-CM | POA: Diagnosis not present

## 2013-02-15 DIAGNOSIS — H2510 Age-related nuclear cataract, unspecified eye: Secondary | ICD-10-CM | POA: Diagnosis not present

## 2013-02-15 DIAGNOSIS — H5010 Unspecified exotropia: Secondary | ICD-10-CM | POA: Diagnosis not present

## 2013-02-15 DIAGNOSIS — S42309D Unspecified fracture of shaft of humerus, unspecified arm, subsequent encounter for fracture with routine healing: Secondary | ICD-10-CM | POA: Diagnosis not present

## 2013-02-15 DIAGNOSIS — H3530 Unspecified macular degeneration: Secondary | ICD-10-CM | POA: Diagnosis not present

## 2013-02-15 DIAGNOSIS — I10 Essential (primary) hypertension: Secondary | ICD-10-CM | POA: Diagnosis not present

## 2013-02-15 DIAGNOSIS — M6281 Muscle weakness (generalized): Secondary | ICD-10-CM | POA: Diagnosis not present

## 2013-02-15 DIAGNOSIS — H5210 Myopia, unspecified eye: Secondary | ICD-10-CM | POA: Diagnosis not present

## 2013-02-15 DIAGNOSIS — Z5189 Encounter for other specified aftercare: Secondary | ICD-10-CM | POA: Diagnosis not present

## 2013-02-17 DIAGNOSIS — R269 Unspecified abnormalities of gait and mobility: Secondary | ICD-10-CM | POA: Diagnosis not present

## 2013-02-17 DIAGNOSIS — S42309D Unspecified fracture of shaft of humerus, unspecified arm, subsequent encounter for fracture with routine healing: Secondary | ICD-10-CM | POA: Diagnosis not present

## 2013-02-17 DIAGNOSIS — M6281 Muscle weakness (generalized): Secondary | ICD-10-CM | POA: Diagnosis not present

## 2013-02-17 DIAGNOSIS — Z5189 Encounter for other specified aftercare: Secondary | ICD-10-CM | POA: Diagnosis not present

## 2013-02-17 DIAGNOSIS — I10 Essential (primary) hypertension: Secondary | ICD-10-CM | POA: Diagnosis not present

## 2013-02-21 DIAGNOSIS — R269 Unspecified abnormalities of gait and mobility: Secondary | ICD-10-CM | POA: Diagnosis not present

## 2013-02-21 DIAGNOSIS — I10 Essential (primary) hypertension: Secondary | ICD-10-CM | POA: Diagnosis not present

## 2013-02-21 DIAGNOSIS — Z5189 Encounter for other specified aftercare: Secondary | ICD-10-CM | POA: Diagnosis not present

## 2013-02-21 DIAGNOSIS — S42309D Unspecified fracture of shaft of humerus, unspecified arm, subsequent encounter for fracture with routine healing: Secondary | ICD-10-CM | POA: Diagnosis not present

## 2013-02-21 DIAGNOSIS — M6281 Muscle weakness (generalized): Secondary | ICD-10-CM | POA: Diagnosis not present

## 2013-02-23 DIAGNOSIS — I10 Essential (primary) hypertension: Secondary | ICD-10-CM | POA: Diagnosis not present

## 2013-02-23 DIAGNOSIS — R269 Unspecified abnormalities of gait and mobility: Secondary | ICD-10-CM | POA: Diagnosis not present

## 2013-02-23 DIAGNOSIS — Z5189 Encounter for other specified aftercare: Secondary | ICD-10-CM | POA: Diagnosis not present

## 2013-02-23 DIAGNOSIS — S42309D Unspecified fracture of shaft of humerus, unspecified arm, subsequent encounter for fracture with routine healing: Secondary | ICD-10-CM | POA: Diagnosis not present

## 2013-02-23 DIAGNOSIS — M6281 Muscle weakness (generalized): Secondary | ICD-10-CM | POA: Diagnosis not present

## 2013-02-24 DIAGNOSIS — IMO0001 Reserved for inherently not codable concepts without codable children: Secondary | ICD-10-CM | POA: Diagnosis not present

## 2013-02-28 DIAGNOSIS — R269 Unspecified abnormalities of gait and mobility: Secondary | ICD-10-CM | POA: Diagnosis not present

## 2013-02-28 DIAGNOSIS — M6281 Muscle weakness (generalized): Secondary | ICD-10-CM | POA: Diagnosis not present

## 2013-02-28 DIAGNOSIS — I10 Essential (primary) hypertension: Secondary | ICD-10-CM | POA: Diagnosis not present

## 2013-02-28 DIAGNOSIS — S42309D Unspecified fracture of shaft of humerus, unspecified arm, subsequent encounter for fracture with routine healing: Secondary | ICD-10-CM | POA: Diagnosis not present

## 2013-02-28 DIAGNOSIS — Z5189 Encounter for other specified aftercare: Secondary | ICD-10-CM | POA: Diagnosis not present

## 2013-03-02 DIAGNOSIS — R269 Unspecified abnormalities of gait and mobility: Secondary | ICD-10-CM | POA: Diagnosis not present

## 2013-03-02 DIAGNOSIS — M6281 Muscle weakness (generalized): Secondary | ICD-10-CM | POA: Diagnosis not present

## 2013-03-02 DIAGNOSIS — Z5189 Encounter for other specified aftercare: Secondary | ICD-10-CM | POA: Diagnosis not present

## 2013-03-02 DIAGNOSIS — I10 Essential (primary) hypertension: Secondary | ICD-10-CM | POA: Diagnosis not present

## 2013-03-02 DIAGNOSIS — S42309D Unspecified fracture of shaft of humerus, unspecified arm, subsequent encounter for fracture with routine healing: Secondary | ICD-10-CM | POA: Diagnosis not present

## 2013-03-07 DIAGNOSIS — Z5189 Encounter for other specified aftercare: Secondary | ICD-10-CM | POA: Diagnosis not present

## 2013-03-07 DIAGNOSIS — M6281 Muscle weakness (generalized): Secondary | ICD-10-CM | POA: Diagnosis not present

## 2013-03-07 DIAGNOSIS — R269 Unspecified abnormalities of gait and mobility: Secondary | ICD-10-CM | POA: Diagnosis not present

## 2013-03-07 DIAGNOSIS — I10 Essential (primary) hypertension: Secondary | ICD-10-CM | POA: Diagnosis not present

## 2013-03-07 DIAGNOSIS — S42309D Unspecified fracture of shaft of humerus, unspecified arm, subsequent encounter for fracture with routine healing: Secondary | ICD-10-CM | POA: Diagnosis not present

## 2013-03-09 DIAGNOSIS — R269 Unspecified abnormalities of gait and mobility: Secondary | ICD-10-CM | POA: Diagnosis not present

## 2013-03-09 DIAGNOSIS — I10 Essential (primary) hypertension: Secondary | ICD-10-CM | POA: Diagnosis not present

## 2013-03-09 DIAGNOSIS — S42309D Unspecified fracture of shaft of humerus, unspecified arm, subsequent encounter for fracture with routine healing: Secondary | ICD-10-CM | POA: Diagnosis not present

## 2013-03-09 DIAGNOSIS — M6281 Muscle weakness (generalized): Secondary | ICD-10-CM | POA: Diagnosis not present

## 2013-03-09 DIAGNOSIS — Z5189 Encounter for other specified aftercare: Secondary | ICD-10-CM | POA: Diagnosis not present

## 2013-03-14 DIAGNOSIS — M6281 Muscle weakness (generalized): Secondary | ICD-10-CM | POA: Diagnosis not present

## 2013-03-14 DIAGNOSIS — S42309D Unspecified fracture of shaft of humerus, unspecified arm, subsequent encounter for fracture with routine healing: Secondary | ICD-10-CM | POA: Diagnosis not present

## 2013-03-14 DIAGNOSIS — Z5189 Encounter for other specified aftercare: Secondary | ICD-10-CM | POA: Diagnosis not present

## 2013-03-14 DIAGNOSIS — R269 Unspecified abnormalities of gait and mobility: Secondary | ICD-10-CM | POA: Diagnosis not present

## 2013-03-14 DIAGNOSIS — I10 Essential (primary) hypertension: Secondary | ICD-10-CM | POA: Diagnosis not present

## 2013-03-24 DIAGNOSIS — M6281 Muscle weakness (generalized): Secondary | ICD-10-CM | POA: Diagnosis not present

## 2013-03-24 DIAGNOSIS — Z5189 Encounter for other specified aftercare: Secondary | ICD-10-CM | POA: Diagnosis not present

## 2013-03-24 DIAGNOSIS — R269 Unspecified abnormalities of gait and mobility: Secondary | ICD-10-CM | POA: Diagnosis not present

## 2013-03-24 DIAGNOSIS — I10 Essential (primary) hypertension: Secondary | ICD-10-CM | POA: Diagnosis not present

## 2013-03-24 DIAGNOSIS — S42309D Unspecified fracture of shaft of humerus, unspecified arm, subsequent encounter for fracture with routine healing: Secondary | ICD-10-CM | POA: Diagnosis not present

## 2013-03-28 DIAGNOSIS — S42309D Unspecified fracture of shaft of humerus, unspecified arm, subsequent encounter for fracture with routine healing: Secondary | ICD-10-CM | POA: Diagnosis not present

## 2013-03-28 DIAGNOSIS — M6281 Muscle weakness (generalized): Secondary | ICD-10-CM | POA: Diagnosis not present

## 2013-03-28 DIAGNOSIS — R269 Unspecified abnormalities of gait and mobility: Secondary | ICD-10-CM | POA: Diagnosis not present

## 2013-03-28 DIAGNOSIS — IMO0001 Reserved for inherently not codable concepts without codable children: Secondary | ICD-10-CM | POA: Diagnosis not present

## 2013-03-28 DIAGNOSIS — Z5189 Encounter for other specified aftercare: Secondary | ICD-10-CM | POA: Diagnosis not present

## 2013-03-28 DIAGNOSIS — S42409A Unspecified fracture of lower end of unspecified humerus, initial encounter for closed fracture: Secondary | ICD-10-CM | POA: Diagnosis not present

## 2013-03-28 DIAGNOSIS — I10 Essential (primary) hypertension: Secondary | ICD-10-CM | POA: Diagnosis not present

## 2013-04-19 ENCOUNTER — Other Ambulatory Visit: Payer: Self-pay | Admitting: *Deleted

## 2013-04-19 MED ORDER — TRIAMTERENE-HCTZ 37.5-25 MG PO CAPS: 1.0000 | ORAL_CAPSULE | ORAL | Status: DC

## 2013-04-25 DIAGNOSIS — IMO0001 Reserved for inherently not codable concepts without codable children: Secondary | ICD-10-CM | POA: Diagnosis not present

## 2013-04-25 DIAGNOSIS — S42409A Unspecified fracture of lower end of unspecified humerus, initial encounter for closed fracture: Secondary | ICD-10-CM | POA: Diagnosis not present

## 2013-04-29 ENCOUNTER — Ambulatory Visit: Payer: Medicare Other | Attending: Orthopedic Surgery | Admitting: Physical Therapy

## 2013-04-29 DIAGNOSIS — IMO0001 Reserved for inherently not codable concepts without codable children: Secondary | ICD-10-CM | POA: Insufficient documentation

## 2013-04-29 DIAGNOSIS — I890 Lymphedema, not elsewhere classified: Secondary | ICD-10-CM | POA: Insufficient documentation

## 2013-05-11 ENCOUNTER — Ambulatory Visit: Payer: Medicare Other | Admitting: Physical Therapy

## 2013-05-11 DIAGNOSIS — IMO0001 Reserved for inherently not codable concepts without codable children: Secondary | ICD-10-CM | POA: Diagnosis not present

## 2013-05-11 DIAGNOSIS — I890 Lymphedema, not elsewhere classified: Secondary | ICD-10-CM | POA: Diagnosis not present

## 2013-05-16 ENCOUNTER — Encounter: Payer: Medicare Other | Admitting: Physical Therapy

## 2013-05-20 ENCOUNTER — Encounter: Payer: Medicare Other | Admitting: Physical Therapy

## 2013-05-27 ENCOUNTER — Encounter: Payer: Medicare Other | Admitting: Physical Therapy

## 2013-06-10 ENCOUNTER — Encounter: Payer: Medicare Other | Admitting: Physical Therapy

## 2013-06-13 DIAGNOSIS — Z23 Encounter for immunization: Secondary | ICD-10-CM | POA: Diagnosis not present

## 2013-08-08 DIAGNOSIS — S42409A Unspecified fracture of lower end of unspecified humerus, initial encounter for closed fracture: Secondary | ICD-10-CM | POA: Diagnosis not present

## 2013-10-13 ENCOUNTER — Ambulatory Visit (INDEPENDENT_AMBULATORY_CARE_PROVIDER_SITE_OTHER): Payer: Medicare Other | Admitting: Family Medicine

## 2013-10-13 ENCOUNTER — Encounter: Payer: Self-pay | Admitting: Family Medicine

## 2013-10-13 VITALS — BP 124/81 | HR 89 | Temp 97.5°F | Ht <= 58 in | Wt 107.8 lb

## 2013-10-13 DIAGNOSIS — I10 Essential (primary) hypertension: Secondary | ICD-10-CM | POA: Diagnosis not present

## 2013-10-13 DIAGNOSIS — S32509A Unspecified fracture of unspecified pubis, initial encounter for closed fracture: Secondary | ICD-10-CM

## 2013-10-13 DIAGNOSIS — Z23 Encounter for immunization: Secondary | ICD-10-CM

## 2013-10-13 DIAGNOSIS — M810 Age-related osteoporosis without current pathological fracture: Secondary | ICD-10-CM

## 2013-10-13 DIAGNOSIS — S32599A Other specified fracture of unspecified pubis, initial encounter for closed fracture: Secondary | ICD-10-CM

## 2013-10-13 MED ORDER — TRIAMTERENE-HCTZ 37.5-25 MG PO CAPS: 1.0000 | ORAL_CAPSULE | ORAL | Status: DC

## 2013-10-13 MED ORDER — PNEUMOCOCCAL 13-VAL CONJ VACC IM SUSP: 0.5000 mL | Freq: Once | INTRAMUSCULAR | Status: DC

## 2013-10-13 NOTE — Patient Instructions (Signed)

## 2013-10-13 NOTE — Progress Notes (Signed)
Patient ID: Gina Mcgee, female   DOB: April 26, 1930, 79 y.o.   MRN: [id number] SUBJECTIVE: CC: Chief Complaint  Patient presents with  . Follow-up    ck up no complaints    HPI: Patient is here for follow up of hypertension: denies Headache;deniesChest Pain;denies weakness;denies Shortness of Breath or Orthopnea;denies Visual changes;denies palpitations;denies cough;denies pedal edema;denies symptoms of TIA or stroke; admits to Compliance with medications. denies Problems with medications.  Osteoporosis Arthritis of hands.   Falls:none since the fall and fracture of the left upper extremity. Working with a Clinical research associate with her balance.   Past Medical History  Diagnosis Date  . 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 Cancer     left-sided breast cancer  . HTN (hypertension)   . Brain concussion 1960  . Osteoporosis    Past Surgical History  Procedure Laterality Date  . Joint replacement    . Femur surgery  2011    left femur pinning  . Bowel resection  approx. in 2007    polyp and formed stricture  . Breast lumpectomy  1981    left lumpectomy w/ radiation  . Colonoscopy w/ polypectomy    . Orif humerus fracture Left 01/25/2013    Procedure: OPEN REDUCTION INTERNAL FIXATION (ORIF) DISTAL HUMERUS FRACTURE REPAIR RECONSTRUCTION AND ULNA NERVE DECOMPRESSION AND ANTERIOR TRANSPOSITION AS NECESSARY;  Surgeon: Roseanne Kaufman, MD;  Location: [facility];  Service: Orthopedics;  Laterality: Left;   History   Social History  . Marital Status: Married    Spouse Name: N/A    Number of Children: N/A  . Years of Education: N/A   Occupational History  . Not on file.   Social History Main Topics  . Smoking status: Never Smoker   . Smokeless tobacco: Never Used  . Alcohol Use: No     Comment: 1 beer 2-3 weeks.  . Drug Use: No  . Sexual Activity: Not on file   Other Topics Concern  . Not on file   Social History Narrative  . No narrative on file   Family History   Problem Relation Age of Onset  . Stroke Mother   . Lung cancer Father   . Stroke Father    Current Outpatient Prescriptions on File Prior to Visit  Medication Sig Dispense Refill  . Calcium Carbonate-Vitamin D (CALCIUM 500 + D PO) Take 1 tablet by mouth 2 (two) times daily.      . Cholecalciferol (VITAMIN D3) 1000 UNITS CAPS Take by mouth.      . beta carotene w/minerals (OCUVITE) tablet Take 1 tablet by mouth daily.      Marland Kitchen oxyCODONE (OXY IR/ROXICODONE) 5 MG immediate release tablet Take 1 tablet (5 mg total) by mouth every 4 (four) hours as needed.  30 tablet  0   No current facility-administered medications on file prior to visit.   Allergies  Allergen Reactions  . Aspirin Other (See Comments)    Gets jitter with large doses  . Fish Allergy Nausea Only and Other (See Comments)    "makes me jumpy and twitchy, but grand children are allergic"   . Nsaids     Can take for short periods of time. Naprosyn -feels dazed  . Aloe Rash  . Sulfa Antibiotics Rash   Immunization History  Administered Date(s) Administered  . Influenza-Unspecified 04/22/2013  . Pneumococcal Conjugate-13 10/13/2013  . Pneumococcal Polysaccharide-23 04/22/2001  . Tdap 06/22/2010  . Zoster 09/22/2006   Prior to Admission medications  Medication Sig Start Date End Date Taking? Authorizing Provider  beta carotene w/minerals (OCUVITE) tablet Take 1 tablet by mouth daily.    Historical Provider, MD  Calcium Carbonate-Vitamin D (CALCIUM 500 + D PO) Take 1 tablet by mouth 2 (two) times daily.    Historical Provider, MD  Cholecalciferol (VITAMIN D3) 1000 UNITS CAPS Take by mouth.    Historical Provider, MD  oxyCODONE (OXY IR/ROXICODONE) 5 MG immediate release tablet Take 1 tablet (5 mg total) by mouth every 4 (four) hours as needed. 01/28/13   Ivan Croft, PA-C  triamterene-hydrochlorothiazide (DYAZIDE) 37.5-25 MG per capsule Take 1 each (1 capsule total) by mouth every morning. 04/19/13   Vernie Shanks, MD   vitamin D, CHOLECALCIFEROL, 400 UNITS tablet Take 400 Units by mouth daily.    Historical Provider, MD     ROS: As above in the HPI. All other systems are stable or negative.  OBJECTIVE: APPEARANCE:  Patient in no acute distress.The patient appeared well nourished and normally developed. Acyanotic. Waist: VITAL SIGNS:BP 124/81  Pulse 89  Temp(Src) 97.5 F (36.4 C) (Oral)  Ht $R'4\' 10"'dC$  (1.473 m)  Wt 107 lb 12.8 oz (48.898 kg)  BMI 22.54 kg/m2  Elderly WF kyphosis  SKIN: warm and  Dry without overt rashes, tattoos and scars  HEAD and Neck: without JVD, Head and scalp: normal Eyes:No scleral icterus. Fundi normal, eye movements normal. Ears: Auricle normal, canal normal, Tympanic membranes normal, insufflation normal. Nose: normal Throat: normal Neck & thyroid: normal  CHEST & LUNGS: Chest wall: normal Lungs: Clear  CVS: Reveals the PMI to be normally located. Regular rhythm, First and Second Heart sounds are normal,  absence of murmurs, rubs or gallops. Peripheral vasculature: Radial pulses: normal Dorsal pedis pulses: normal Posterior pulses: normal  ABDOMEN:  Appearance: normal Benign, no organomegaly, no masses, no Abdominal Aortic enlargement. No Guarding , no rebound. No Bruits. Bowel sounds: normal  RECTAL: N/A GU: N/A  EXTREMETIES: nonedematous.  MUSCULOSKELETAL:  Spine: kyphosis Joints: arthritic  Changes of the joints of the fingers and hands with wasting of the interossei muscles of the hands. S/p Fracture of the left elbow.  S/p fracture of the pubic rami   NEUROLOGIC: oriented to time,place and person; nonfocal.  Results for orders placed during the hospital encounter of 01/25/13  CBC WITH DIFFERENTIAL      Result Value Range   WBC 7.4  4.0 - 10.5 K/uL   RBC 3.28 (*) 3.87 - 5.11 MIL/uL   Hemoglobin 9.8 (*) 12.0 - 15.0 g/dL   HCT 28.4 (*) 36.0 - 46.0 %   MCV 86.6  78.0 - 100.0 fL   MCH 29.9  26.0 - 34.0 pg   MCHC 34.5  30.0 - 36.0 g/dL    RDW 13.2  11.5 - 15.5 %   Platelets 271  150 - 400 K/uL   Neutrophils Relative % 86 (*) 43 - 77 %   Neutro Abs 6.4  1.7 - 7.7 K/uL   Lymphocytes Relative 7 (*) 12 - 46 %   Lymphs Abs 0.5 (*) 0.7 - 4.0 K/uL   Monocytes Relative 7  3 - 12 %   Monocytes Absolute 0.5  0.1 - 1.0 K/uL   Eosinophils Relative 0  0 - 5 %   Eosinophils Absolute 0.0  0.0 - 0.7 K/uL   Basophils Relative 0  0 - 1 %   Basophils Absolute 0.0  0.0 - 0.1 K/uL  BASIC METABOLIC PANEL  Result Value Range   Sodium 133 (*) 135 - 145 mEq/L   Potassium 2.9 (*) 3.5 - 5.1 mEq/L   Chloride 95 (*) 96 - 112 mEq/L   CO2 29  19 - 32 mEq/L   Glucose, Bld 125 (*) 70 - 99 mg/dL   BUN 12  6 - 23 mg/dL   Creatinine, Ser 0.67  0.50 - 1.10 mg/dL   Calcium 8.5  8.4 - 10.5 mg/dL   GFR calc non Af Amer 81 (*) >90 mL/min   GFR calc Af Amer >90  >90 mL/min  CBC WITH DIFFERENTIAL      Result Value Range   WBC 7.4  4.0 - 10.5 K/uL   RBC 3.12 (*) 3.87 - 5.11 MIL/uL   Hemoglobin 9.2 (*) 12.0 - 15.0 g/dL   HCT 26.9 (*) 36.0 - 46.0 %   MCV 86.2  78.0 - 100.0 fL   MCH 29.5  26.0 - 34.0 pg   MCHC 34.2  30.0 - 36.0 g/dL   RDW 13.6  11.5 - 15.5 %   Platelets 266  150 - 400 K/uL   Neutrophils Relative % 79 (*) 43 - 77 %   Neutro Abs 5.8  1.7 - 7.7 K/uL   Lymphocytes Relative 12  12 - 46 %   Lymphs Abs 0.9  0.7 - 4.0 K/uL   Monocytes Relative 8  3 - 12 %   Monocytes Absolute 0.6  0.1 - 1.0 K/uL   Eosinophils Relative 1  0 - 5 %   Eosinophils Absolute 0.0  0.0 - 0.7 K/uL   Basophils Relative 0  0 - 1 %   Basophils Absolute 0.0  0.0 - 0.1 K/uL  BASIC METABOLIC PANEL      Result Value Range   Sodium 133 (*) 135 - 145 mEq/L   Potassium 3.4 (*) 3.5 - 5.1 mEq/L   Chloride 96  96 - 112 mEq/L   CO2 27  19 - 32 mEq/L   Glucose, Bld 122 (*) 70 - 99 mg/dL   BUN 10  6 - 23 mg/dL   Creatinine, Ser 0.69  0.50 - 1.10 mg/dL   Calcium 8.4  8.4 - 10.5 mg/dL   GFR calc non Af Amer 80 (*) >90 mL/min   GFR calc Af Amer >90  >90 mL/min     ASSESSMENT: Osteoporosis - Plan: Vit D  25 hydroxy (rtn osteoporosis monitoring)  HTN (hypertension) - Plan: CMP14+EGFR, triamterene-hydrochlorothiazide (DYAZIDE) 37.5-25 MG per capsule  Fracture of pubic ramus  Need for pneumococcal vaccination - Plan: pneumococcal 13-valent conjugate vaccine (PREVNAR 13) injection 0.5 mL  PLAN:  Orders Placed This Encounter  Procedures  . CMP14+EGFR  . Vit D  25 hydroxy (rtn osteoporosis monitoring)   Meds ordered this encounter  Medications  . triamterene-hydrochlorothiazide (DYAZIDE) 37.5-25 MG per capsule    Sig: Take 1 each (1 capsule total) by mouth every morning.    Dispense:  90 capsule    Refill:  3  . pneumococcal 13-valent conjugate vaccine (PREVNAR 13) injection 0.5 mL    Sig:   discussed wellness:  Discussed osteoporosis treatment. Patient declines treatment. She feels at her age that the risks outways the benefits for her. no change in treatment.  Medications Discontinued During This Encounter  Medication Reason  . vitamin D, CHOLECALCIFEROL, 400 UNITS tablet Change in therapy  . triamterene-hydrochlorothiazide (DYAZIDE) 37.5-25 MG per capsule Reorder   Return in about 6 months (around 04/12/2014) for Recheck medical problems.  Dub Mikes  Darylene Price, M.D.

## 2013-10-14 LAB — CMP14+EGFR
ALT: 15 IU/L (ref 0–32)
AST: 25 IU/L (ref 0–40)
Albumin/Globulin Ratio: 2.1 (ref 1.1–2.5)
Albumin: 4.9 g/dL — ABNORMAL HIGH (ref 3.5–4.7)
Alkaline Phosphatase: 121 IU/L — ABNORMAL HIGH (ref 39–117)
BUN/Creatinine Ratio: 22 (ref 11–26)
BUN: 20 mg/dL (ref 8–27)
CO2: 25 mmol/L (ref 18–29)
Calcium: 10 mg/dL (ref 8.7–10.3)
Chloride: 97 mmol/L (ref 97–108)
Creatinine, Ser: 0.89 mg/dL (ref 0.57–1.00)
GFR calc Af Amer: 70 mL/min/{1.73_m2} (ref >59)
GFR calc non Af Amer: 61 mL/min/{1.73_m2} (ref >59)
Globulin, Total: 2.3 g/dL (ref 1.5–4.5)
Glucose: 98 mg/dL (ref 65–99)
Potassium: 4.4 mmol/L (ref 3.5–5.2)
Sodium: 139 mmol/L (ref 134–144)
Total Bilirubin: <0.2 mg/dL (ref 0.0–1.2)
Total Protein: 7.2 g/dL (ref 6.0–8.5)

## 2013-10-14 LAB — VITAMIN D 25 HYDROXY (VIT D DEFICIENCY, FRACTURES): Vit D, 25-Hydroxy: 32.2 ng/mL (ref 30.0–100.0)

## 2013-10-16 NOTE — Progress Notes (Signed)
Quick Note:  Call patient. Labs normal. No change in plan. ______ 

## 2014-02-21 DIAGNOSIS — H3530 Unspecified macular degeneration: Secondary | ICD-10-CM | POA: Diagnosis not present

## 2014-02-21 DIAGNOSIS — H5010 Unspecified exotropia: Secondary | ICD-10-CM | POA: Diagnosis not present

## 2014-02-21 DIAGNOSIS — H5210 Myopia, unspecified eye: Secondary | ICD-10-CM | POA: Diagnosis not present

## 2014-02-21 DIAGNOSIS — H2510 Age-related nuclear cataract, unspecified eye: Secondary | ICD-10-CM | POA: Diagnosis not present

## 2014-05-04 DIAGNOSIS — G540 Brachial plexus disorders: Secondary | ICD-10-CM | POA: Diagnosis not present

## 2014-05-04 DIAGNOSIS — M810 Age-related osteoporosis without current pathological fracture: Secondary | ICD-10-CM | POA: Diagnosis not present

## 2014-05-04 DIAGNOSIS — C50919 Malignant neoplasm of unspecified site of unspecified female breast: Secondary | ICD-10-CM | POA: Diagnosis not present

## 2014-05-04 DIAGNOSIS — Z8781 Personal history of (healed) traumatic fracture: Secondary | ICD-10-CM | POA: Diagnosis not present

## 2014-05-04 DIAGNOSIS — I10 Essential (primary) hypertension: Secondary | ICD-10-CM | POA: Diagnosis not present

## 2014-07-14 DIAGNOSIS — Z23 Encounter for immunization: Secondary | ICD-10-CM | POA: Diagnosis not present

## 2014-08-14 ENCOUNTER — Other Ambulatory Visit: Payer: Self-pay | Admitting: Dermatology

## 2014-08-14 DIAGNOSIS — Z85828 Personal history of other malignant neoplasm of skin: Secondary | ICD-10-CM | POA: Diagnosis not present

## 2014-08-14 DIAGNOSIS — D485 Neoplasm of uncertain behavior of skin: Secondary | ICD-10-CM | POA: Diagnosis not present

## 2014-08-14 DIAGNOSIS — L570 Actinic keratosis: Secondary | ICD-10-CM | POA: Diagnosis not present

## 2014-08-24 DIAGNOSIS — S42492D Other displaced fracture of lower end of left humerus, subsequent encounter for fracture with routine healing: Secondary | ICD-10-CM | POA: Diagnosis not present

## 2014-09-04 DIAGNOSIS — Z86010 Personal history of colonic polyps: Secondary | ICD-10-CM | POA: Diagnosis not present

## 2014-09-04 DIAGNOSIS — G540 Brachial plexus disorders: Secondary | ICD-10-CM | POA: Diagnosis not present

## 2014-09-04 DIAGNOSIS — J310 Chronic rhinitis: Secondary | ICD-10-CM | POA: Diagnosis not present

## 2014-09-04 DIAGNOSIS — S329XXA Fracture of unspecified parts of lumbosacral spine and pelvis, initial encounter for closed fracture: Secondary | ICD-10-CM | POA: Diagnosis not present

## 2014-09-04 DIAGNOSIS — M810 Age-related osteoporosis without current pathological fracture: Secondary | ICD-10-CM | POA: Diagnosis not present

## 2014-09-04 DIAGNOSIS — I10 Essential (primary) hypertension: Secondary | ICD-10-CM | POA: Diagnosis not present

## 2014-09-04 DIAGNOSIS — S42409A Unspecified fracture of lower end of unspecified humerus, initial encounter for closed fracture: Secondary | ICD-10-CM | POA: Diagnosis not present

## 2014-09-04 DIAGNOSIS — E559 Vitamin D deficiency, unspecified: Secondary | ICD-10-CM | POA: Diagnosis not present

## 2015-01-11 DIAGNOSIS — I10 Essential (primary) hypertension: Secondary | ICD-10-CM | POA: Diagnosis not present

## 2015-01-11 DIAGNOSIS — J387 Other diseases of larynx: Secondary | ICD-10-CM | POA: Diagnosis not present

## 2015-01-11 DIAGNOSIS — M40209 Unspecified kyphosis, site unspecified: Secondary | ICD-10-CM | POA: Diagnosis not present

## 2015-01-11 DIAGNOSIS — E559 Vitamin D deficiency, unspecified: Secondary | ICD-10-CM | POA: Diagnosis not present

## 2015-02-28 DIAGNOSIS — H3531 Nonexudative age-related macular degeneration: Secondary | ICD-10-CM | POA: Diagnosis not present

## 2015-02-28 DIAGNOSIS — H2513 Age-related nuclear cataract, bilateral: Secondary | ICD-10-CM | POA: Diagnosis not present

## 2015-02-28 DIAGNOSIS — H5213 Myopia, bilateral: Secondary | ICD-10-CM | POA: Diagnosis not present

## 2015-03-22 DIAGNOSIS — H2512 Age-related nuclear cataract, left eye: Secondary | ICD-10-CM | POA: Diagnosis not present

## 2015-03-22 DIAGNOSIS — H25812 Combined forms of age-related cataract, left eye: Secondary | ICD-10-CM | POA: Diagnosis not present

## 2015-04-05 DIAGNOSIS — H25811 Combined forms of age-related cataract, right eye: Secondary | ICD-10-CM | POA: Diagnosis not present

## 2015-04-05 DIAGNOSIS — H2511 Age-related nuclear cataract, right eye: Secondary | ICD-10-CM | POA: Diagnosis not present

## 2015-06-23 DIAGNOSIS — Z23 Encounter for immunization: Secondary | ICD-10-CM | POA: Diagnosis not present

## 2015-07-12 DIAGNOSIS — I10 Essential (primary) hypertension: Secondary | ICD-10-CM | POA: Diagnosis not present

## 2015-07-12 DIAGNOSIS — S329XXA Fracture of unspecified parts of lumbosacral spine and pelvis, initial encounter for closed fracture: Secondary | ICD-10-CM | POA: Diagnosis not present

## 2015-07-12 DIAGNOSIS — E559 Vitamin D deficiency, unspecified: Secondary | ICD-10-CM | POA: Diagnosis not present

## 2015-07-12 DIAGNOSIS — M40209 Unspecified kyphosis, site unspecified: Secondary | ICD-10-CM | POA: Diagnosis not present

## 2015-07-12 DIAGNOSIS — N63 Unspecified lump in breast: Secondary | ICD-10-CM | POA: Diagnosis not present

## 2015-07-12 DIAGNOSIS — G540 Brachial plexus disorders: Secondary | ICD-10-CM | POA: Diagnosis not present

## 2015-07-12 DIAGNOSIS — J387 Other diseases of larynx: Secondary | ICD-10-CM | POA: Diagnosis not present

## 2015-07-14 ENCOUNTER — Other Ambulatory Visit: Payer: Self-pay | Admitting: Family Medicine

## 2015-07-14 DIAGNOSIS — N63 Unspecified lump in unspecified breast: Secondary | ICD-10-CM

## 2015-07-14 DIAGNOSIS — Z853 Personal history of malignant neoplasm of breast: Secondary | ICD-10-CM

## 2015-07-14 DIAGNOSIS — N644 Mastodynia: Secondary | ICD-10-CM

## 2015-09-03 ENCOUNTER — Other Ambulatory Visit: Payer: Self-pay | Admitting: Family Medicine

## 2015-09-03 DIAGNOSIS — N63 Unspecified lump in unspecified breast: Secondary | ICD-10-CM

## 2015-09-03 DIAGNOSIS — N644 Mastodynia: Secondary | ICD-10-CM

## 2015-09-03 DIAGNOSIS — Z853 Personal history of malignant neoplasm of breast: Secondary | ICD-10-CM

## 2015-09-10 ENCOUNTER — Ambulatory Visit
Admission: RE | Admit: 2015-09-10 | Discharge: 2015-09-10 | Disposition: A | Discharge status: Home / Self Care | Payer: Medicare Other | Source: Ambulatory Visit | Attending: Family Medicine | Admitting: Family Medicine

## 2015-09-10 DIAGNOSIS — N63 Unspecified lump in unspecified breast: Secondary | ICD-10-CM

## 2015-09-10 DIAGNOSIS — N644 Mastodynia: Secondary | ICD-10-CM

## 2015-09-10 DIAGNOSIS — Z853 Personal history of malignant neoplasm of breast: Secondary | ICD-10-CM

## 2015-10-01 ENCOUNTER — Other Ambulatory Visit: Payer: Self-pay | Admitting: Family Medicine

## 2015-10-01 DIAGNOSIS — N6320 Unspecified lump in the left breast, unspecified quadrant: Secondary | ICD-10-CM

## 2015-10-04 ENCOUNTER — Ambulatory Visit
Admission: RE | Admit: 2015-10-04 | Discharge: 2015-10-04 | Disposition: A | Discharge status: Home / Self Care | Payer: Medicare Other | Source: Ambulatory Visit | Attending: Family Medicine | Admitting: Family Medicine

## 2015-10-04 ENCOUNTER — Other Ambulatory Visit: Payer: Self-pay | Admitting: Family Medicine

## 2015-10-04 DIAGNOSIS — N6320 Unspecified lump in the left breast, unspecified quadrant: Secondary | ICD-10-CM

## 2015-10-04 DIAGNOSIS — N63 Unspecified lump in breast: Secondary | ICD-10-CM | POA: Diagnosis not present

## 2015-10-04 DIAGNOSIS — N6489 Other specified disorders of breast: Secondary | ICD-10-CM | POA: Diagnosis not present

## 2015-10-04 DIAGNOSIS — N6012 Diffuse cystic mastopathy of left breast: Secondary | ICD-10-CM | POA: Diagnosis not present

## 2015-11-29 DIAGNOSIS — Z8349 Family history of other endocrine, nutritional and metabolic diseases: Secondary | ICD-10-CM | POA: Diagnosis not present

## 2016-01-24 DIAGNOSIS — Z8349 Family history of other endocrine, nutritional and metabolic diseases: Secondary | ICD-10-CM | POA: Diagnosis not present

## 2016-01-24 DIAGNOSIS — M810 Age-related osteoporosis without current pathological fracture: Secondary | ICD-10-CM | POA: Diagnosis not present

## 2016-01-24 DIAGNOSIS — J387 Other diseases of larynx: Secondary | ICD-10-CM | POA: Diagnosis not present

## 2016-01-24 DIAGNOSIS — Z86010 Personal history of colonic polyps: Secondary | ICD-10-CM | POA: Diagnosis not present

## 2016-01-24 DIAGNOSIS — G540 Brachial plexus disorders: Secondary | ICD-10-CM | POA: Diagnosis not present

## 2016-01-24 DIAGNOSIS — E559 Vitamin D deficiency, unspecified: Secondary | ICD-10-CM | POA: Diagnosis not present

## 2016-01-24 DIAGNOSIS — I10 Essential (primary) hypertension: Secondary | ICD-10-CM | POA: Diagnosis not present

## 2016-01-24 DIAGNOSIS — M40209 Unspecified kyphosis, site unspecified: Secondary | ICD-10-CM | POA: Diagnosis not present

## 2016-05-13 DIAGNOSIS — H5213 Myopia, bilateral: Secondary | ICD-10-CM | POA: Diagnosis not present

## 2016-05-13 DIAGNOSIS — H532 Diplopia: Secondary | ICD-10-CM | POA: Diagnosis not present

## 2016-05-13 DIAGNOSIS — H353132 Nonexudative age-related macular degeneration, bilateral, intermediate dry stage: Secondary | ICD-10-CM | POA: Diagnosis not present

## 2016-05-13 DIAGNOSIS — H26493 Other secondary cataract, bilateral: Secondary | ICD-10-CM | POA: Diagnosis not present

## 2016-05-22 DIAGNOSIS — H26492 Other secondary cataract, left eye: Secondary | ICD-10-CM | POA: Diagnosis not present

## 2016-06-12 DIAGNOSIS — Z23 Encounter for immunization: Secondary | ICD-10-CM | POA: Diagnosis not present

## 2016-07-29 DIAGNOSIS — M810 Age-related osteoporosis without current pathological fracture: Secondary | ICD-10-CM | POA: Diagnosis not present

## 2016-07-29 DIAGNOSIS — I10 Essential (primary) hypertension: Secondary | ICD-10-CM | POA: Diagnosis not present

## 2016-07-29 DIAGNOSIS — G540 Brachial plexus disorders: Secondary | ICD-10-CM | POA: Diagnosis not present

## 2016-07-29 DIAGNOSIS — K219 Gastro-esophageal reflux disease without esophagitis: Secondary | ICD-10-CM | POA: Diagnosis not present

## 2016-07-29 DIAGNOSIS — M40209 Unspecified kyphosis, site unspecified: Secondary | ICD-10-CM | POA: Diagnosis not present

## 2016-07-29 DIAGNOSIS — E559 Vitamin D deficiency, unspecified: Secondary | ICD-10-CM | POA: Diagnosis not present

## 2017-02-13 DIAGNOSIS — M40209 Unspecified kyphosis, site unspecified: Secondary | ICD-10-CM | POA: Diagnosis not present

## 2017-02-13 DIAGNOSIS — G540 Brachial plexus disorders: Secondary | ICD-10-CM | POA: Diagnosis not present

## 2017-02-13 DIAGNOSIS — M810 Age-related osteoporosis without current pathological fracture: Secondary | ICD-10-CM | POA: Diagnosis not present

## 2017-02-13 DIAGNOSIS — I10 Essential (primary) hypertension: Secondary | ICD-10-CM | POA: Diagnosis not present

## 2017-02-13 DIAGNOSIS — K219 Gastro-esophageal reflux disease without esophagitis: Secondary | ICD-10-CM | POA: Diagnosis not present

## 2017-02-13 DIAGNOSIS — E559 Vitamin D deficiency, unspecified: Secondary | ICD-10-CM | POA: Diagnosis not present

## 2017-04-06 DIAGNOSIS — J029 Acute pharyngitis, unspecified: Secondary | ICD-10-CM | POA: Diagnosis not present

## 2017-06-23 DIAGNOSIS — H532 Diplopia: Secondary | ICD-10-CM | POA: Diagnosis not present

## 2017-06-23 DIAGNOSIS — H5213 Myopia, bilateral: Secondary | ICD-10-CM | POA: Diagnosis not present

## 2017-06-23 DIAGNOSIS — H5010 Unspecified exotropia: Secondary | ICD-10-CM | POA: Diagnosis not present

## 2017-06-25 DIAGNOSIS — Z23 Encounter for immunization: Secondary | ICD-10-CM | POA: Diagnosis not present

## 2017-08-27 DIAGNOSIS — K219 Gastro-esophageal reflux disease without esophagitis: Secondary | ICD-10-CM | POA: Diagnosis not present

## 2017-08-27 DIAGNOSIS — M810 Age-related osteoporosis without current pathological fracture: Secondary | ICD-10-CM | POA: Diagnosis not present

## 2017-08-27 DIAGNOSIS — E559 Vitamin D deficiency, unspecified: Secondary | ICD-10-CM | POA: Diagnosis not present

## 2017-08-27 DIAGNOSIS — I10 Essential (primary) hypertension: Secondary | ICD-10-CM | POA: Diagnosis not present

## 2017-08-27 DIAGNOSIS — M40209 Unspecified kyphosis, site unspecified: Secondary | ICD-10-CM | POA: Diagnosis not present

## 2017-08-27 DIAGNOSIS — G540 Brachial plexus disorders: Secondary | ICD-10-CM | POA: Diagnosis not present

## 2018-02-25 DIAGNOSIS — K219 Gastro-esophageal reflux disease without esophagitis: Secondary | ICD-10-CM | POA: Diagnosis not present

## 2018-02-25 DIAGNOSIS — Z9109 Other allergy status, other than to drugs and biological substances: Secondary | ICD-10-CM | POA: Diagnosis not present

## 2018-02-25 DIAGNOSIS — M810 Age-related osteoporosis without current pathological fracture: Secondary | ICD-10-CM | POA: Diagnosis not present

## 2018-02-25 DIAGNOSIS — M24542 Contracture, left hand: Secondary | ICD-10-CM | POA: Diagnosis not present

## 2018-02-25 DIAGNOSIS — E559 Vitamin D deficiency, unspecified: Secondary | ICD-10-CM | POA: Diagnosis not present

## 2018-02-25 DIAGNOSIS — G540 Brachial plexus disorders: Secondary | ICD-10-CM | POA: Diagnosis not present

## 2018-02-25 DIAGNOSIS — M40209 Unspecified kyphosis, site unspecified: Secondary | ICD-10-CM | POA: Diagnosis not present

## 2018-02-25 DIAGNOSIS — I10 Essential (primary) hypertension: Secondary | ICD-10-CM | POA: Diagnosis not present

## 2018-07-01 DIAGNOSIS — Z86010 Personal history of colonic polyps: Secondary | ICD-10-CM | POA: Diagnosis not present

## 2018-07-01 DIAGNOSIS — M810 Age-related osteoporosis without current pathological fracture: Secondary | ICD-10-CM | POA: Diagnosis not present

## 2018-07-01 DIAGNOSIS — I10 Essential (primary) hypertension: Secondary | ICD-10-CM | POA: Diagnosis not present

## 2018-07-01 DIAGNOSIS — G540 Brachial plexus disorders: Secondary | ICD-10-CM | POA: Diagnosis not present

## 2018-07-01 DIAGNOSIS — M40209 Unspecified kyphosis, site unspecified: Secondary | ICD-10-CM | POA: Diagnosis not present

## 2018-07-01 DIAGNOSIS — Z23 Encounter for immunization: Secondary | ICD-10-CM | POA: Diagnosis not present

## 2018-07-01 DIAGNOSIS — M24542 Contracture, left hand: Secondary | ICD-10-CM | POA: Diagnosis not present

## 2018-07-01 DIAGNOSIS — J387 Other diseases of larynx: Secondary | ICD-10-CM | POA: Diagnosis not present

## 2018-07-01 DIAGNOSIS — R269 Unspecified abnormalities of gait and mobility: Secondary | ICD-10-CM | POA: Diagnosis not present

## 2018-07-06 DIAGNOSIS — H532 Diplopia: Secondary | ICD-10-CM | POA: Diagnosis not present

## 2018-07-06 DIAGNOSIS — Z961 Presence of intraocular lens: Secondary | ICD-10-CM | POA: Diagnosis not present

## 2018-11-04 DIAGNOSIS — R269 Unspecified abnormalities of gait and mobility: Secondary | ICD-10-CM | POA: Diagnosis not present

## 2018-11-04 DIAGNOSIS — G540 Brachial plexus disorders: Secondary | ICD-10-CM | POA: Diagnosis not present

## 2018-11-04 DIAGNOSIS — M810 Age-related osteoporosis without current pathological fracture: Secondary | ICD-10-CM | POA: Diagnosis not present

## 2018-11-04 DIAGNOSIS — M24542 Contracture, left hand: Secondary | ICD-10-CM | POA: Diagnosis not present

## 2018-11-04 DIAGNOSIS — J387 Other diseases of larynx: Secondary | ICD-10-CM | POA: Diagnosis not present

## 2018-11-04 DIAGNOSIS — Z9181 History of falling: Secondary | ICD-10-CM | POA: Diagnosis not present

## 2018-11-04 DIAGNOSIS — M40209 Unspecified kyphosis, site unspecified: Secondary | ICD-10-CM | POA: Diagnosis not present

## 2018-11-04 DIAGNOSIS — Z86010 Personal history of colonic polyps: Secondary | ICD-10-CM | POA: Diagnosis not present

## 2018-11-04 DIAGNOSIS — I10 Essential (primary) hypertension: Secondary | ICD-10-CM | POA: Diagnosis not present

## 2018-11-04 DIAGNOSIS — R29898 Other symptoms and signs involving the musculoskeletal system: Secondary | ICD-10-CM | POA: Diagnosis not present

## 2018-11-04 DIAGNOSIS — I83893 Varicose veins of bilateral lower extremities with other complications: Secondary | ICD-10-CM | POA: Diagnosis not present

## 2018-11-04 DIAGNOSIS — Z148 Genetic carrier of other disease: Secondary | ICD-10-CM | POA: Diagnosis not present

## 2018-11-10 ENCOUNTER — Other Ambulatory Visit: Payer: Self-pay

## 2018-11-10 ENCOUNTER — Ambulatory Visit: Payer: Medicare Other | Attending: Family Medicine

## 2018-11-10 DIAGNOSIS — R2689 Other abnormalities of gait and mobility: Secondary | ICD-10-CM | POA: Insufficient documentation

## 2018-11-10 DIAGNOSIS — M6281 Muscle weakness (generalized): Secondary | ICD-10-CM | POA: Insufficient documentation

## 2018-11-10 NOTE — Therapy (Signed)
[facility] [address], [place], [place], [address] Phone: [phone]   Fax:  [phone]  Physical Therapy Treatment  Patient Details  Name: Gina Mcgee MRN: [id number] Date of Birth: 12-25-1929 Referring Provider (PT): Yaakov Guthrie, MD   Encounter Date: 11/10/2018  PT End of Session - 11/10/18 1608    Visit Number  1    Date for PT Re-Evaluation  01/05/19    PT Start Time  7209    PT Stop Time  1609    PT Time Calculation (min)  46 min    Activity Tolerance  Patient tolerated treatment well    Behavior During Therapy  [facility] for tasks assessed/performed       Past Medical History:  Diagnosis Date  . Brain concussion 1960  . Cancer [facility])    left-sided breast cancer  . 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 HTN (hypertension)   . Osteoporosis     Past Surgical History:  Procedure Laterality Date  . BOWEL RESECTION  approx. in 2007   polyp and formed stricture  . BREAST LUMPECTOMY  1981   left lumpectomy w/ radiation  . COLONOSCOPY W/ POLYPECTOMY    . FEMUR SURGERY  2011   left femur pinning  . JOINT REPLACEMENT    . ORIF HUMERUS FRACTURE Left 01/25/2013   Procedure: OPEN REDUCTION INTERNAL FIXATION (ORIF) DISTAL HUMERUS FRACTURE REPAIR RECONSTRUCTION AND ULNA NERVE DECOMPRESSION AND ANTERIOR TRANSPOSITION AS NECESSARY;  Surgeon: Roseanne Kaufman, MD;  Location: [place];  Service: Orthopedics;  Laterality: Left;    There were no vitals filed for this visit.  Subjective Assessment - 11/10/18 1527    Subjective  Pt presents to PT with weakness and gait abnormality and Lt LE weakness of a chronic nature. 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  Pt has a splint that she wears at night.  She has noticed worsening contracture and use of the Lt hand.      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  Lt hip fracture/pelvic fracture, spinal contractures- 2011/2012,    Diagnostic tests  none     Patient Stated Goals  improve balance, improve strength and endurance    Currently in Pain?  No/denies         [facility] PT Assessment - 11/10/18 0001      Assessment   Medical Diagnosis  muscular deconditioning, Lt arm contracture    Referring Provider (PT)  Yaakov Guthrie, MD    Onset Date/Surgical Date  --   chronic   Hand Dominance  Right    Prior Therapy  many years ago for Lt hand      Precautions   Precautions  Fall    Precaution Comments  osteoporosis, history of Rt breast cancer, speech deficits when fatigued      Restrictions   Weight Bearing Restrictions  No      Balance Screen   Has the patient fallen in the past 6 months  No    Has the patient had a decrease in activity level because of a fear of falling?   No    Is the patient reluctant to leave their home because of a fear of falling?   No      Home Social worker  Private residence    Living Arrangements  Alone  Available Help at Discharge  Personal care attendant   during the day   Type of [place] Access  Level entry    [place] - single point;Tub bench;Grab bars - toilet;Grab bars - tub/shower;Hand held shower head      Prior Function   Level of Independence  Independent    Vocation  Retired    Leisure  exercise class 2x/wk      Cognition   Overall Cognitive Status  Within Functional Limits for tasks assessed      Posture/Postural Control   Posture/Postural Control  Postural limitations    Postural Limitations  Flexed trunk;Weight shift left;Increased thoracic kyphosis;Forward head;Rounded Shoulders      ROM / Strength   AROM / PROM / Strength  AROM;Strength      AROM   Overall AROM   Due to impaired cognition    Overall AROM Comments  pt had difficulty coordinating movements after strength testing so we didn't test A/ROM       Strength   Overall Strength  Deficits    Overall Strength Comments  Lt LE 4/5, Rt LE 4+/5, UEs 4/5      Transfers   Transfers  Sit to Stand;Stand to Sit    Sit to Stand  With upper extremity assist;Uncontrolled descent    Five time sit to stand comments   33 seconds without UEs- very uncontrolled descent.  24 seconds with hands- improved yet uncontrolled descent    Stand to Sit  Without upper extremity assist;Uncontrolled descent      Ambulation/Gait   Ambulation/Gait  Yes    Ambulation/Gait Assistance  5: Supervision    Ambulation Distance (Feet)  100 Feet    Assistive device  Straight cane    Gait Pattern  Step-through pattern;Decreased arm swing - left;Decreased stride length    Gait Comments  instability with change of direction on level surface                           PT Education - 11/10/18 1557    Education Details  Access Code: [email or web address]    Person(s) Educated  Patient    Methods  Explanation;Handout;Demonstration    Comprehension  Verbalized understanding;Returned demonstration       PT Short Term Goals - 11/10/18 1613      PT SHORT TERM GOAL #1   Title  be independent in initial HEP    Time  4    Period  Weeks    Status  New    Target Date  12/08/18      PT SHORT TERM GOAL #2   Title  improve strength to perform sit to stand with minimal to no UE support    Time  4    Period  Weeks    Status  New    Target Date  12/08/18      PT SHORT TERM GOAL #3   Title  demonstrate controlled descent with stand to sit transition with minimal UE support    Time  4    Period  Weeks    Status  New    Target Date  12/08/18        PT Long Term Goals - 11/10/18 1615      PT LONG TERM GOAL #1   Title  be independent in advanced HEP    Time  8    Period  Weeks    Status  New    Target Date  01/05/19      PT LONG TERM GOAL #2   Title  perform 5x sit to stand with minimal UE support in < or = to 20 seconds to improve safety and balance     Time  8    Period  Weeks    Status  New    Target Date  01/05/19      PT LONG TERM GOAL #3   Title  improve LE strength to perform stand to sit transition with controlled descent without UE support    Time  8    Period  Weeks    Status  New    Target Date  01/05/19      PT LONG TERM GOAL #4   Title  demonstrate 4/5 Lt LE strength to improve safety and endurance with gait at home and in the community    Time  8    Period  Weeks    Status  New    Target Date  01/05/19            Plan - 11/10/18 1622    Clinical Impression Statement  Pt presents to PT with complex medical history.  Pt had concussion injury 60 years ago and has resultant Lt hand/arm contracture, balance deficits, speech difficulty with physical exertion and Lt LE weakness.  Pt demonstrates forward trunk flexion, increased thoracic kyphosis and Lt lateral trunk flexion in standing.  Pt with gait abnormality, Lt LE weakness, and significant difficulty with sit to stand transition.  MD ordered PT to address Lt hand contracture and after further discussion with pt, she will return to her hand doctor, Dr Roseanne Kaufman due to recent changes in function of her Lt hand.  Pt has speech deficits after physical exertion upon exam and difficulty coordinating movements when fatigued.  Pt will benefit from skilled PT for LE strength, balance and gait training to improve safety and endurance at home and in the community.      History and Personal Factors relevant to plan of care:  Lt weakness, brain injury 60 years ago, Lt hand contracture, history of Rt breast cancer.  Lives alone and has home health aide 5x/wk    Clinical Presentation  Evolving    Clinical Presentation due to:  worsening gait     Clinical Decision Making  Moderate    Rehab Potential  Good    PT Frequency  2x / week    PT Duration  8 weeks    PT Treatment/Interventions  ADLs/Self Care Home Management;Gait training;Stair training;Functional mobility  training;Therapeutic activities;Therapeutic exercise;Patient/family education;Neuromuscular re-education;Balance training;Passive range of motion;Taping    PT Next Visit Plan  work on sit to stand transition, endurance/strength of Lt>Rt LE, gait with cane with change of direction.      PT Home Exercise Plan  Access Code: [email or web address]    Recommended Other Services  PT advised pt to return to her hand MD Amedeo Plenty) or discuss with Dr Jacelyn Grip regarding recent changes in her Lt hand.  OT referral is more appropriate at this time due to neurological deficits.       Patient will benefit from skilled therapeutic intervention in order to improve the following deficits and impairments:  Difficulty walking, Decreased balance, Decreased activity tolerance, Decreased endurance, Decreased strength, Postural dysfunction, Abnormal gait  Visit Diagnosis: Muscle weakness (generalized) - Plan: PT plan of care cert/re-cert  Other  abnormalities of gait and mobility - Plan: PT plan of care cert/re-cert     Problem List Patient Active Problem List   Diagnosis Date Noted  . Osteoporosis   . Abnormal CXR 01/20/2013  . Preop cardiovascular exam 01/20/2013  . Surgery, elective 01/18/2013  . Fracture of pubic ramus ([place]) 11/21/2011  . Hip pain, acute 11/20/2011  . Fall 11/20/2011  . Hyponatremia 11/20/2011  . Hypokalemia 11/20/2011  . Pelvic fracture ([place]) 11/20/2011  . HTN (hypertension)   . Concussion   . History of breast cancer in female    Sigurd Sos, PT 11/10/18 4:36 PM  [facility] [address], [place], [place], [address] Phone: [phone]   Fax:  [phone]  Name: DAYELIN BALDUCCI MRN: [id number] Date of Birth: 1930/02/19

## 2018-11-10 NOTE — Patient Instructions (Signed)
Access Code: [email or web address]  URL: [email or web address]  Date: 11/10/2018  Prepared by: Sigurd Sos   Exercises Seated Long Arc Quad - 10 reps - 2 sets - 5 hold - 3x daily - 7x weekly Seated March - 10 reps - 3 sets - 3x daily - 7x weekly Seated Heel Toe Raises - 10 reps - 2 sets - 3x daily - 7x weekly Seated Isometric Hip Adduction with Ball - 10 reps - 2 sets - 3x daily - 7x weekly

## 2018-11-16 ENCOUNTER — Encounter: Payer: Self-pay | Admitting: Physical Therapy

## 2018-11-16 ENCOUNTER — Ambulatory Visit: Payer: Medicare Other | Admitting: Physical Therapy

## 2018-11-16 DIAGNOSIS — R2689 Other abnormalities of gait and mobility: Secondary | ICD-10-CM | POA: Diagnosis not present

## 2018-11-16 DIAGNOSIS — M6281 Muscle weakness (generalized): Secondary | ICD-10-CM | POA: Diagnosis not present

## 2018-11-16 NOTE — Therapy (Signed)
[facility] [address] [place], [place], [address] Phone: [phone]   Fax:  [phone]  Physical Therapy Treatment  Patient Details  Name: Gina Mcgee MRN: [id number] Date of Birth: 1930-06-01 Referring Provider (PT): Yaakov Guthrie, MD   Encounter Date: 11/16/2018  PT End of Session - 11/16/18 1405    Visit Number  2    Date for PT Re-Evaluation  01/05/19    PT Start Time  1400    PT Stop Time  1440    PT Time Calculation (min)  40 min    Activity Tolerance  No increased pain;Patient limited by fatigue    Behavior During Therapy  [facility] for tasks assessed/performed       Past Medical History:  Diagnosis Date  . Brain concussion 1960  . Cancer [facility])    left-sided breast cancer  . 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 HTN (hypertension)   . Osteoporosis     Past Surgical History:  Procedure Laterality Date  . BOWEL RESECTION  approx. in 2007   polyp and formed stricture  . BREAST LUMPECTOMY  1981   left lumpectomy w/ radiation  . COLONOSCOPY W/ POLYPECTOMY    . FEMUR SURGERY  2011   left femur pinning  . JOINT REPLACEMENT    . ORIF HUMERUS FRACTURE Left 01/25/2013   Procedure: OPEN REDUCTION INTERNAL FIXATION (ORIF) DISTAL HUMERUS FRACTURE REPAIR RECONSTRUCTION AND ULNA NERVE DECOMPRESSION AND ANTERIOR TRANSPOSITION AS NECESSARY;  Surgeon: Roseanne Kaufman, MD;  Location: [place];  Service: Orthopedics;  Laterality: Left;    There were no vitals filed for this visit.  Subjective Assessment - 11/16/18 1402    Subjective  Pt states that the exercises "knocked her speech out". She states this is improved now.    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  Lt hip fracture/pelvic fracture, spinal contractures- 2011/2012,    Diagnostic tests  none     Patient Stated Goals  improve balance, improve strength and  endurance    Currently in Pain?  No/denies             [facility] Adult PT Treatment/Exercise - 11/16/18 0001      Ambulation/Gait   Pre-Gait Activities  weaving forward through cones no AD x2 trials; side step weaving around cones without AD x2 trials     Gait Comments  x149ft with direction changes for additional challenge, using SPC; pt required rest break due to reported dizziness       Exercises   Exercises  Knee/Hip;Other Exercises    Other Exercises   discussed HEP and adjustments for fatigue       Knee/Hip Exercises: Seated   Clamshell with TheraBand  Yellow   2x10 reps          Balance Exercises - 11/16/18 1435      Balance Exercises: Standing   Other Standing Exercises  single LE tap on step without UE support x10 reps; alternating x6 reps with rest break and followed by 5 reps        PT Education - 11/16/18 1442    Education Details  updated HEP; discussed adjustments     Person(s) Educated  Patient    Methods  Explanation;Handout    Comprehension  Verbalized understanding       PT Short Term Goals - 11/10/18 1613      PT  SHORT TERM GOAL #1   Title  be independent in initial HEP    Time  4    Period  Weeks    Status  New    Target Date  12/08/18      PT SHORT TERM GOAL #2   Title  improve strength to perform sit to stand with minimal to no UE support    Time  4    Period  Weeks    Status  New    Target Date  12/08/18      PT SHORT TERM GOAL #3   Title  demonstrate controlled descent with stand to sit transition with minimal UE support    Time  4    Period  Weeks    Status  New    Target Date  12/08/18        PT Long Term Goals - 11/10/18 1615      PT LONG TERM GOAL #1   Title  be independent in advanced HEP    Time  8    Period  Weeks    Status  New    Target Date  01/05/19      PT LONG TERM GOAL #2   Title  perform 5x sit to stand with minimal UE support in < or = to 20 seconds to improve safety and balance    Time  8    Period   Weeks    Status  New    Target Date  01/05/19      PT LONG TERM GOAL #3   Title  improve LE strength to perform stand to sit transition with controlled descent without UE support    Time  8    Period  Weeks    Status  New    Target Date  01/05/19      PT LONG TERM GOAL #4   Title  demonstrate 4/5 Lt LE strength to improve safety and endurance with gait at home and in the community    Time  8    Period  Weeks    Status  New    Target Date  01/05/19            Plan - 11/16/18 1443    Clinical Impression Statement  Pt arrived with reports of issues following her HEP completion yesterday. Therapist was able to review her HEP and make adjustments for improve adherence at home. Pt did require frequent rest breaks during today's session but was able to complete all gait training and balance activity without LOB. Will continue with current POC.    Rehab Potential  Good    PT Frequency  2x / week    PT Duration  8 weeks    PT Treatment/Interventions  ADLs/Self Care Home Management;Gait training;Stair training;Functional mobility training;Therapeutic activities;Therapeutic exercise;Patient/family education;Neuromuscular re-education;Balance training;Passive range of motion;Taping    PT Next Visit Plan  work on sit to stand transition, endurance/strength of Lt>Rt LE, gait with cane with change of direction.      PT Home Exercise Plan  Access Code: [email or web address]    Consulted and Agree with Plan of Care  Patient       Patient will benefit from skilled therapeutic intervention in order to improve the following deficits and impairments:  Difficulty walking, Decreased balance, Decreased activity tolerance, Decreased endurance, Decreased strength, Postural dysfunction, Abnormal gait  Visit Diagnosis: Muscle weakness (generalized)  Other abnormalities of gait and mobility     Problem List Patient  Active Problem List   Diagnosis Date Noted  . Osteoporosis   . Abnormal CXR 01/20/2013  .  Preop cardiovascular exam 01/20/2013  . Surgery, elective 01/18/2013  . Fracture of pubic ramus ([place]) 11/21/2011  . Hip pain, acute 11/20/2011  . Fall 11/20/2011  . Hyponatremia 11/20/2011  . Hypokalemia 11/20/2011  . Pelvic fracture ([place]) 11/20/2011  . HTN (hypertension)   . Concussion   . History of breast cancer in female     Sherol Dade 11/16/2018, 2:47 PM  [place] [address] [place], [place], [address] Phone: [phone]   Fax:  [phone]  Name: Gina Mcgee MRN: [id number] Date of Birth: 03-Sep-1930

## 2018-11-16 NOTE — Patient Instructions (Signed)
Access Code: [email or web address]  URL: [email or web address]  Date: 11/16/2018  Prepared by: Sherol Dade   Exercises  Seated Long Arc Quad - 10 reps - 1-2 sets - 5 hold - 3x daily - 7x weekly  Seated March - 10 reps - 1-2 sets - 3x daily - 7x weekly  Seated Heel Toe Raises - 10 reps - 1-2 sets - 3x daily - 7x weekly  Seated Isometric Hip Adduction with Ball - 10 reps - 1-2 sets - 3x daily - 7x weekly  Seated Hip Abduction - 10 reps - 1 sets - 2-3x daily - 7x weekly    [facility] [address], [place], [place] [address] Phone # [phone] Fax [phone]

## 2018-11-23 ENCOUNTER — Ambulatory Visit: Payer: Medicare Other | Attending: Family Medicine | Admitting: Physical Therapy

## 2018-11-23 ENCOUNTER — Encounter: Payer: Self-pay | Admitting: Physical Therapy

## 2018-11-23 DIAGNOSIS — R2689 Other abnormalities of gait and mobility: Secondary | ICD-10-CM

## 2018-11-23 DIAGNOSIS — M6281 Muscle weakness (generalized): Secondary | ICD-10-CM | POA: Diagnosis not present

## 2018-11-23 NOTE — Therapy (Signed)
[facility] [address], [place], [place], [address] Phone: [phone]   Fax:  [phone]  Physical Therapy Treatment  Patient Details  Name: Gina Mcgee MRN: [id number] Date of Birth: [birth year] Referring Provider (PT): Yaakov Guthrie, MD   Encounter Date: 11/23/2018  PT End of Session - 11/23/18 1437    Visit Number  3    Date for PT Re-Evaluation  01/05/19    PT Start Time  1400    PT Stop Time  6283    PT Time Calculation (min)  39 min    Activity Tolerance  No increased pain;Patient tolerated treatment well    Behavior During Therapy  [facility] for tasks assessed/performed       Past Medical History:  Diagnosis Date  . Brain concussion 1960  . Cancer [facility])    left-sided breast cancer  . 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 HTN (hypertension)   . Osteoporosis     Past Surgical History:  Procedure Laterality Date  . BOWEL RESECTION  approx. in 2007   polyp and formed stricture  . BREAST LUMPECTOMY  1981   left lumpectomy w/ radiation  . COLONOSCOPY W/ POLYPECTOMY    . FEMUR SURGERY  2011   left femur pinning  . JOINT REPLACEMENT    . ORIF HUMERUS FRACTURE Left 01/25/2013   Procedure: OPEN REDUCTION INTERNAL FIXATION (ORIF) DISTAL HUMERUS FRACTURE REPAIR RECONSTRUCTION AND ULNA NERVE DECOMPRESSION AND ANTERIOR TRANSPOSITION AS NECESSARY;  Surgeon: Roseanne Kaufman, MD;  Location: [place];  Service: Orthopedics;  Laterality: Left;    There were no vitals filed for this visit.  Subjective Assessment - 11/23/18 1356    Subjective  Pt states that she felt fine after her last session. She does not think it was too much.     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  Lt hip fracture/pelvic fracture, spinal contractures- 2011/2012,    Diagnostic tests  none     Patient Stated Goals  improve balance, improve strength  and endurance    Currently in Pain?  No/denies                       [facility] Adult PT Treatment/Exercise - 11/23/18 0001      Knee/Hip Exercises: Seated   Long Arc Quad  Strengthening;Both;2 sets;10 reps    Long Arc Quad Weight  2 lbs.    Clamshell with TheraBand  Red   2x10   Marching  Strengthening;Left;Right;2 sets;10 reps    Marching Weights  2 lbs.    Sit to Sand  1 set;5 reps   seated on foam         Balance Exercises - 11/23/18 1413      Balance Exercises: Standing   Standing Eyes Opened  Head turns;Narrow base of support (BOS);Foam/compliant surface   2x10 reps   Standing Eyes Closed  Narrow base of support (BOS);Foam/compliant surface;2 reps;20 secs    Tandem Stance  Eyes open;2 reps;30 secs    SLS with Vectors  Solid surface;Intermittent upper extremity assist;10 secs;2 reps   rest break between sets   Step Over Hurdles / Cones  walking forward without AD x4 trials with cones spaced unevenly    Other Standing Exercises  floor ladder side stepping x4 trials each direction; forward with step # commands and turning Lt/Rt for 3 min  PT Education - 11/23/18 1437    Education Details  updated resistance with HEP    Person(s) Educated  Patient    Methods  Explanation    Comprehension  Verbalized understanding       PT Short Term Goals - 11/23/18 1441      PT SHORT TERM GOAL #1   Title  be independent in initial HEP    Time  4    Period  Weeks    Status  Achieved    Target Date  12/08/18      PT SHORT TERM GOAL #2   Title  improve strength to perform sit to stand with minimal to no UE support    Time  4    Period  Weeks    Status  Achieved    Target Date  12/08/18      PT SHORT TERM GOAL #3   Title  demonstrate controlled descent with stand to sit transition with minimal UE support    Time  4    Period  Weeks    Status  New    Target Date  12/08/18        PT Long Term Goals - 11/10/18 1615      PT LONG TERM GOAL #1   Title   be independent in advanced HEP    Time  8    Period  Weeks    Status  New    Target Date  01/05/19      PT LONG TERM GOAL #2   Title  perform 5x sit to stand with minimal UE support in < or = to 20 seconds to improve safety and balance    Time  8    Period  Weeks    Status  New    Target Date  01/05/19      PT LONG TERM GOAL #3   Title  improve LE strength to perform stand to sit transition with controlled descent without UE support    Time  8    Period  Weeks    Status  New    Target Date  01/05/19      PT LONG TERM GOAL #4   Title  demonstrate 4/5 Lt LE strength to improve safety and endurance with gait at home and in the community    Time  8    Period  Weeks    Status  New    Target Date  01/05/19            Plan - 11/23/18 1439    Clinical Impression Statement  Pt continues to complete her HEP regularly at home. She has improved single leg balance, able to maintain up to 5 or more seconds on each LE without LOB. Added vector stance which required intermittent UE support and noted more difficulty with stance on the Lt. Pt did not demonstrate as much fatigue as she did at her last session and was able to complete progressions in resistance without significant difficulty. Will continue with current POC.     Rehab Potential  Good    PT Frequency  2x / week    PT Duration  8 weeks    PT Treatment/Interventions  ADLs/Self Care Home Management;Gait training;Stair training;Functional mobility training;Therapeutic activities;Therapeutic exercise;Patient/family education;Neuromuscular re-education;Balance training;Passive range of motion;Taping    PT Next Visit Plan  work on sit to stand transition, endurance/strength of Lt>Rt LE, gait with cane with change of direction.  PT Home Exercise Plan  Access Code: [email or web address]    Consulted and Agree with Plan of Care  Patient       Patient will benefit from skilled therapeutic intervention in order to improve the following deficits  and impairments:  Difficulty walking, Decreased balance, Decreased activity tolerance, Decreased endurance, Decreased strength, Postural dysfunction, Abnormal gait  Visit Diagnosis: Muscle weakness (generalized)  Other abnormalities of gait and mobility     Problem List Patient Active Problem List   Diagnosis Date Noted  . Osteoporosis   . Abnormal CXR 01/20/2013  . Preop cardiovascular exam 01/20/2013  . Surgery, elective 01/18/2013  . Fracture of pubic ramus ([place]) 11/21/2011  . Hip pain, acute 11/20/2011  . Fall 11/20/2011  . Hyponatremia 11/20/2011  . Hypokalemia 11/20/2011  . Pelvic fracture ([place]) 11/20/2011  . HTN (hypertension)   . Concussion   . History of breast cancer in female    2:43 PM,11/23/18 Sherol Dade PT, DPT [facility] [place] [address], [place], [place], [address] Phone: [phone]   Fax:  [phone]  Name: TORRA PALA MRN: [id number] Date of Birth: September 06, 1930

## 2018-11-24 DIAGNOSIS — M25522 Pain in left elbow: Secondary | ICD-10-CM | POA: Diagnosis not present

## 2018-11-24 DIAGNOSIS — G5622 Lesion of ulnar nerve, left upper limb: Secondary | ICD-10-CM | POA: Diagnosis not present

## 2018-11-25 ENCOUNTER — Encounter: Payer: Self-pay | Admitting: Physical Therapy

## 2018-11-25 ENCOUNTER — Ambulatory Visit: Payer: Medicare Other | Admitting: Physical Therapy

## 2018-11-25 DIAGNOSIS — M6281 Muscle weakness (generalized): Secondary | ICD-10-CM | POA: Diagnosis not present

## 2018-11-25 DIAGNOSIS — R2689 Other abnormalities of gait and mobility: Secondary | ICD-10-CM | POA: Diagnosis not present

## 2018-11-25 NOTE — Therapy (Signed)
[facility] [address], [place], [place], [address] Phone: [phone]   Fax:  [phone]  Physical Therapy Treatment  Patient Details  Name: Gina Mcgee MRN: [id number] Date of Birth: Jan 28, 1930 Referring Provider (PT): Yaakov Guthrie, MD   Encounter Date: 11/25/2018  PT End of Session - 11/25/18 1531    Visit Number  4    Date for PT Re-Evaluation  01/05/19    PT Start Time  3299    PT Stop Time  2426    PT Time Calculation (min)  40 min    Activity Tolerance  No increased pain;Patient tolerated treatment well    Behavior During Therapy  [facility] for tasks assessed/performed       Past Medical History:  Diagnosis Date  . Brain concussion 1960  . Cancer [facility])    left-sided breast cancer  . 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 HTN (hypertension)   . Osteoporosis     Past Surgical History:  Procedure Laterality Date  . BOWEL RESECTION  approx. in 2007   polyp and formed stricture  . BREAST LUMPECTOMY  1981   left lumpectomy w/ radiation  . COLONOSCOPY W/ POLYPECTOMY    . FEMUR SURGERY  2011   left femur pinning  . JOINT REPLACEMENT    . ORIF HUMERUS FRACTURE Left 01/25/2013   Procedure: OPEN REDUCTION INTERNAL FIXATION (ORIF) DISTAL HUMERUS FRACTURE REPAIR RECONSTRUCTION AND ULNA NERVE DECOMPRESSION AND ANTERIOR TRANSPOSITION AS NECESSARY;  Surgeon: Roseanne Kaufman, MD;  Location: [place];  Service: Orthopedics;  Laterality: Left;    There were no vitals filed for this visit.  Subjective Assessment - 11/25/18 1449    Subjective  Pt states that things are going well. She was not too worn out after her last session.     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  Lt hip fracture/pelvic fracture, spinal contractures- 2011/2012,    Diagnostic tests  none     Patient Stated Goals  improve balance, improve strength and  endurance    Currently in Pain?  No/denies                       [facility] Adult PT Treatment/Exercise - 11/25/18 0001      Transfers   Five time sit to stand comments   11 sec, no UE support and controlled descent       Knee/Hip Exercises: Seated   Long Arc Quad  Strengthening;Both;2 sets;10 reps    Long Arc Quad Weight  3 lbs.    Long [employer] Limitations  last rep hold for 10 sec      Knee/Hip Exercises: Supine   Straight Leg Raises  Strengthening;Both;2 sets;10 reps    Other Supine Knee/Hip Exercises  hooklying hip flexion isometric 10x3 sec hold Lt and Rt       Knee/Hip Exercises: Sidelying   Hip ABduction  Strengthening;Left;Right;1 set;10 reps          Balance Exercises - 11/25/18 1520      Balance Exercises: Standing   SLS  Eyes open;Solid surface;Foam/compliant surface;10 secs;4 reps   intermittent RLE support on Lt   Step Over Hurdles / Cones  side step over pool noodle 2x10 reps close supervision    Cone Rotation Limitations  NBOS trunk rotation holding yellow weighted ball 2x5 reps CGA    Other Standing  Exercises  --        PT Education - 11/25/18 1531    Education Details  technique with therex    Person(s) Educated  Patient    Methods  Explanation;Demonstration    Comprehension  Verbalized understanding;Returned demonstration       PT Short Term Goals - 11/25/18 1506      PT SHORT TERM GOAL #1   Title  be independent in initial HEP    Time  4    Period  Weeks    Status  Achieved    Target Date  12/08/18      PT SHORT TERM GOAL #2   Title  improve strength to perform sit to stand with minimal to no UE support    Time  4    Period  Weeks    Status  Achieved    Target Date  12/08/18      PT SHORT TERM GOAL #3   Title  demonstrate controlled descent with stand to sit transition with minimal UE support    Time  4    Period  Weeks    Status  New    Target Date  12/08/18        PT Long Term Goals - 11/25/18 1506      PT LONG  TERM GOAL #1   Title  be independent in advanced HEP    Time  8    Period  Weeks    Status  On-going      PT LONG TERM GOAL #2   Title  perform 5x sit to stand with minimal UE support in < or = to 20 seconds to improve safety and balance    Baseline  11 sec, no UE    Time  8    Period  Weeks    Status  Achieved      PT LONG TERM GOAL #3   Title  improve LE strength to perform stand to sit transition with controlled descent without UE support    Time  8    Period  Weeks    Status  New      PT LONG TERM GOAL #4   Title  demonstrate 4/5 Lt LE strength to improve safety and endurance with gait at home and in the community    Time  8    Period  Weeks    Status  New            Plan - 11/25/18 1534    Clinical Impression Statement  Pt has met all short term goals at this point in her POC. She was able to complete 5x sit to stand in 11 sec without UE support and with good control during her descent. Session focused on therex progressions for improved LLE strength and endurance. Also targeted single leg stability on various surfaces. Pt was able to complete RLE single leg balance on foam surface up to 7 sec without LOB, but did require cues to increase Lt glute activation during attempts on this side. Will continue with current POC moving forward to decrease pt's risk of falling at home and in the community.    Rehab Potential  Good    PT Frequency  2x / week    PT Duration  8 weeks    PT Treatment/Interventions  ADLs/Self Care Home Management;Gait training;Stair training;Functional mobility training;Therapeutic activities;Therapeutic exercise;Patient/family education;Neuromuscular re-education;Balance training;Passive range of motion;Taping    PT Next Visit Plan  endurance/strength of Lt>Rt  LE, dynamic balance activity    PT Home Exercise Plan  Access Code: [email or web address]    Consulted and Agree with Plan of Care  Patient       Patient will benefit from skilled therapeutic intervention  in order to improve the following deficits and impairments:  Difficulty walking, Decreased balance, Decreased activity tolerance, Decreased endurance, Decreased strength, Postural dysfunction, Abnormal gait  Visit Diagnosis: Muscle weakness (generalized)  Other abnormalities of gait and mobility     Problem List Patient Active Problem List   Diagnosis Date Noted  . Osteoporosis   . Abnormal CXR 01/20/2013  . Preop cardiovascular exam 01/20/2013  . Surgery, elective 01/18/2013  . Fracture of pubic ramus ([place]) 11/21/2011  . Hip pain, acute 11/20/2011  . Fall 11/20/2011  . Hyponatremia 11/20/2011  . Hypokalemia 11/20/2011  . Pelvic fracture ([place]) 11/20/2011  . HTN (hypertension)   . Concussion   . History of breast cancer in female    3:40 PM,11/25/18 Sherol Dade PT, DPT [facility] [address] [place], [place], [address] Phone: [phone]   Fax:  [phone]  Name: Gina Mcgee MRN: [id number] Date of Birth: [birth year]

## 2018-12-01 ENCOUNTER — Ambulatory Visit: Payer: Medicare Other

## 2018-12-01 ENCOUNTER — Other Ambulatory Visit: Payer: Self-pay

## 2018-12-01 DIAGNOSIS — M6281 Muscle weakness (generalized): Secondary | ICD-10-CM | POA: Diagnosis not present

## 2018-12-01 DIAGNOSIS — R2689 Other abnormalities of gait and mobility: Secondary | ICD-10-CM

## 2018-12-01 NOTE — Therapy (Signed)
[facility] [address], [place], [place], [address] Phone: [phone]   Fax:  [phone]  Physical Therapy Treatment  Patient Details  Name: Gina Mcgee MRN: [id number] Date of Birth: 07/08/1930 Referring Provider (PT): Yaakov Guthrie, MD   Encounter Date: 12/01/2018  PT End of Session - 12/01/18 1529    Visit Number  5    Date for PT Re-Evaluation  01/05/19    PT Start Time  5974    PT Stop Time  1528    PT Time Calculation (min)  43 min    Activity Tolerance  No increased pain;Patient tolerated treatment well    Behavior During Therapy  [facility] for tasks assessed/performed       Past Medical History:  Diagnosis Date  . Brain concussion 1960  . Cancer [facility])    left-sided breast cancer  . 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 HTN (hypertension)   . Osteoporosis     Past Surgical History:  Procedure Laterality Date  . BOWEL RESECTION  approx. in 2007   polyp and formed stricture  . BREAST LUMPECTOMY  1981   left lumpectomy w/ radiation  . COLONOSCOPY W/ POLYPECTOMY    . FEMUR SURGERY  2011   left femur pinning  . JOINT REPLACEMENT    . ORIF HUMERUS FRACTURE Left 01/25/2013   Procedure: OPEN REDUCTION INTERNAL FIXATION (ORIF) DISTAL HUMERUS FRACTURE REPAIR RECONSTRUCTION AND ULNA NERVE DECOMPRESSION AND ANTERIOR TRANSPOSITION AS NECESSARY;  Surgeon: Roseanne Kaufman, MD;  Location: [place];  Service: Orthopedics;  Laterality: Left;    There were no vitals filed for this visit.                    [facility] Adult PT Treatment/Exercise - 12/01/18 0001      Knee/Hip Exercises: Seated   Long Arc Quad  Strengthening;Both;2 sets;10 reps    Long Arc Quad Weight  3 lbs.    Marching  Strengthening;Left;Right;2 sets;10 reps    Marching Weights  3 lbs.      Knee/Hip Exercises: Supine   Straight Leg Raises  Strengthening;Both;2 sets;10 reps    Other Supine Knee/Hip Exercises  hooklying  hip flexion isometric 10x3 sec hold Lt and Rt       Knee/Hip Exercises: Sidelying   Clams  2x10 bil.           Balance Exercises - 12/01/18 1518      Balance Exercises: Standing   Standing Eyes Opened  Foam/compliant surface;5 reps;Head turns   x 20 seconds static, head turns x 10 each   Cone Rotation Limitations  NBOS trunk rotation holding yellow weighted ball 2x5 reps CGA        PT Education - 12/01/18 1517    Education Details  Access Code: [email or web address]    Person(s) Educated  Patient    Methods  Explanation;Demonstration;Handout    Comprehension  Verbalized understanding;Returned demonstration       PT Short Term Goals - 11/25/18 1506      PT SHORT TERM GOAL #1   Title  be independent in initial HEP    Time  4    Period  Weeks    Status  Achieved    Target Date  12/08/18      PT SHORT TERM GOAL #2   Title  improve strength to perform sit to stand with minimal to no UE support    Time  4  Period  Weeks    Status  Achieved    Target Date  12/08/18      PT SHORT TERM GOAL #3   Title  demonstrate controlled descent with stand to sit transition with minimal UE support    Time  4    Period  Weeks    Status  New    Target Date  12/08/18        PT Long Term Goals - 11/25/18 1506      PT LONG TERM GOAL #1   Title  be independent in advanced HEP    Time  8    Period  Weeks    Status  On-going      PT LONG TERM GOAL #2   Title  perform 5x sit to stand with minimal UE support in < or = to 20 seconds to improve safety and balance    Baseline  11 sec, no UE    Time  8    Period  Weeks    Status  Achieved      PT LONG TERM GOAL #3   Title  improve LE strength to perform stand to sit transition with controlled descent without UE support    Time  8    Period  Weeks    Status  New      PT LONG TERM GOAL #4   Title  demonstrate 4/5 Lt LE strength to improve safety and endurance with gait at home and in the community    Time  8    Period  Weeks    Status   New            Plan - 12/01/18 1509    Clinical Impression Statement  Pt tolerated treatment last session well without significant fatigue.  PT added to HEP today for hip strength and single leg stance.  Pt required CGA for exercise on foam pad and single leg stance activity.  Pt continues to demonstrate gait instability and core weakness of a chronic nature.  Pt with improved sit to stand without UE support and single leg balance on level surface and will continue to benefit from skilled PT for strength, balance and gait to improve safety.      Rehab Potential  Good    PT Frequency  2x / week    PT Duration  8 weeks    PT Treatment/Interventions  ADLs/Self Care Home Management;Gait training;Stair training;Functional mobility training;Therapeutic activities;Therapeutic exercise;Patient/family education;Neuromuscular re-education;Balance training;Passive range of motion;Taping    PT Next Visit Plan  endurance/strength of Lt>Rt LE, dynamic balance activity    PT Home Exercise Plan  Access Code: [email or web address]    Consulted and Agree with Plan of Care  Patient       Patient will benefit from skilled therapeutic intervention in order to improve the following deficits and impairments:  Difficulty walking, Decreased balance, Decreased activity tolerance, Decreased endurance, Decreased strength, Postural dysfunction, Abnormal gait  Visit Diagnosis: Muscle weakness (generalized)  Other abnormalities of gait and mobility     Problem List Patient Active Problem List   Diagnosis Date Noted  . Osteoporosis   . Abnormal CXR 01/20/2013  . Preop cardiovascular exam 01/20/2013  . Surgery, elective 01/18/2013  . Fracture of pubic ramus ([place]) 11/21/2011  . Hip pain, acute 11/20/2011  . Fall 11/20/2011  . Hyponatremia 11/20/2011  . Hypokalemia 11/20/2011  . Pelvic fracture ([place]) 11/20/2011  . HTN (hypertension)   . Concussion   . History of  breast cancer in female     Sigurd Sos,  PT 12/01/18 3:31 PM  [facility] [address], [place], [place], [address] Phone: [phone]   Fax:  [phone]  Name: ALUEL SCHWARZ MRN: [id number] Date of Birth: 1930-08-31

## 2018-12-01 NOTE — Patient Instructions (Signed)
  Access Code: [email or web address]  URL: [email or web address]  Date: 12/01/2018  Prepared by: Sigurd Sos   Exercises  Supine Active Straight Leg Raise - 10 reps - 2 sets - 1x daily - 7x weekly Standing Single Leg Stance with Unilateral Counter Support - 5 reps - 10 hold - 2x daily - 7x weekly Clamshell - 10 reps - 2 sets - 2x daily - 7x weekly

## 2018-12-02 ENCOUNTER — Other Ambulatory Visit: Payer: Self-pay

## 2018-12-02 ENCOUNTER — Encounter: Payer: Self-pay | Admitting: Physical Therapy

## 2018-12-02 ENCOUNTER — Ambulatory Visit: Payer: Medicare Other | Admitting: Physical Therapy

## 2018-12-02 DIAGNOSIS — R2689 Other abnormalities of gait and mobility: Secondary | ICD-10-CM

## 2018-12-02 DIAGNOSIS — M6281 Muscle weakness (generalized): Secondary | ICD-10-CM | POA: Diagnosis not present

## 2018-12-02 NOTE — Therapy (Signed)
[facility] [address] [place], [place], [address] Phone: [phone]   Fax:  [phone]  Physical Therapy Treatment  Patient Details  Name: CEANA FIALA MRN: [id number] Date of Birth: 06/28/1930 Referring Provider (PT): Yaakov Guthrie, MD   Encounter Date: 12/02/2018  PT End of Session - 12/02/18 1523    Visit Number  6    Date for PT Re-Evaluation  01/05/19    PT Start Time  9381    PT Stop Time  1523    PT Time Calculation (min)  39 min    Activity Tolerance  No increased pain;Patient tolerated treatment well    Behavior During Therapy  [facility] for tasks assessed/performed       Past Medical History:  Diagnosis Date  . Brain concussion 1960  . Cancer [facility])    left-sided breast cancer  . 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 HTN (hypertension)   . Osteoporosis     Past Surgical History:  Procedure Laterality Date  . BOWEL RESECTION  approx. in 2007   polyp and formed stricture  . BREAST LUMPECTOMY  1981   left lumpectomy w/ radiation  . COLONOSCOPY W/ POLYPECTOMY    . FEMUR SURGERY  2011   left femur pinning  . JOINT REPLACEMENT    . ORIF HUMERUS FRACTURE Left 01/25/2013   Procedure: OPEN REDUCTION INTERNAL FIXATION (ORIF) DISTAL HUMERUS FRACTURE REPAIR RECONSTRUCTION AND ULNA NERVE DECOMPRESSION AND ANTERIOR TRANSPOSITION AS NECESSARY;  Surgeon: Roseanne Kaufman, MD;  Location: [place];  Service: Orthopedics;  Laterality: Left;    There were no vitals filed for this visit.  Subjective Assessment - 12/02/18 1448    Subjective  Pt states that she did well yesterday. She has no concerns at this time.    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  Lt hip fracture/pelvic fracture, spinal contractures- 2011/2012,    Diagnostic tests  none     Patient Stated Goals  improve balance, improve strength and endurance    Currently in Pain?  No/denies                       [facility] Adult PT Treatment/Exercise - 12/02/18 0001      Knee/Hip Exercises: Standing   Forward Step Up  1 set;Both;10 reps;Hand Hold: 1;Step Height: 6"    Forward Step Up Limitations  pt with LLE fatigure reported end of this activity       Knee/Hip Exercises: Seated   Long Arc Quad  Strengthening;Both;2 sets;10 reps    Long Arc Quad Weight  4 lbs.    Heel Slides Limitations  Rt x12 reps yellow TB, Lt 2x12 reps yellow TB     Marching  Strengthening;Left;Right;2 sets;10 reps    Marching Weights  4 lbs.          Balance Exercises - 12/02/18 1501      Balance Exercises: Standing   Standing Eyes Opened  Head turns;Foam/compliant surface   x20 reps Lt/Rt   Standing Eyes Closed  Narrow base of support (BOS);Foam/compliant surface;1 rep;30 secs    Standing, One Foot on a Step  Eyes open;6 inch;Eyes closed;15 secs   x2 sets each    Cone Rotation Limitations  NBOS on foam trunk rotation holding yellow weighted ball x10 reps        PT Education - 12/02/18 1523  Education Details  technique with therex    Person(s) Educated  Patient    Methods  Explanation;Verbal cues    Comprehension  Verbalized understanding;Returned demonstration       PT Short Term Goals - 11/25/18 1506      PT SHORT TERM GOAL #1   Title  be independent in initial HEP    Time  4    Period  Weeks    Status  Achieved    Target Date  12/08/18      PT SHORT TERM GOAL #2   Title  improve strength to perform sit to stand with minimal to no UE support    Time  4    Period  Weeks    Status  Achieved    Target Date  12/08/18      PT SHORT TERM GOAL #3   Title  demonstrate controlled descent with stand to sit transition with minimal UE support    Time  4    Period  Weeks    Status  New    Target Date  12/08/18        PT Long Term Goals - 11/25/18 1506      PT LONG TERM GOAL #1   Title  be independent in advanced HEP    Time  8     Period  Weeks    Status  On-going      PT LONG TERM GOAL #2   Title  perform 5x sit to stand with minimal UE support in < or = to 20 seconds to improve safety and balance    Baseline  11 sec, no UE    Time  8    Period  Weeks    Status  Achieved      PT LONG TERM GOAL #3   Title  improve LE strength to perform stand to sit transition with controlled descent without UE support    Time  8    Period  Weeks    Status  New      PT LONG TERM GOAL #4   Title  demonstrate 4/5 Lt LE strength to improve safety and endurance with gait at home and in the community    Time  8    Period  Weeks    Status  New            Plan - 12/02/18 1524    Clinical Impression Statement  Introduced new standing therex and proprioceptive exercises this session. Pt did have difficulty with step up, requiring atleast 1 UE support to prevent LOB. She demonstrates heavy trunk lean when attempting Lt single leg stability, secondary to limitations in trunk and Lt gluteal strength. She does note improvement in her ability to complete sit to stand, noting she is not falling into the chairs as often. Will continue with current POC moving forward.     Rehab Potential  Good    PT Frequency  2x / week    PT Duration  8 weeks    PT Treatment/Interventions  ADLs/Self Care Home Management;Gait training;Stair training;Functional mobility training;Therapeutic activities;Therapeutic exercise;Patient/family education;Neuromuscular re-education;Balance training;Passive range of motion;Taping    PT Next Visit Plan  endurance/strength of Lt>Rt LE, dynamic balance activity    PT Home Exercise Plan  Access Code: [email or web address]    Consulted and Agree with Plan of Care  Patient       Patient will benefit from skilled therapeutic intervention in order to improve the following deficits and  impairments:  Difficulty walking, Decreased balance, Decreased activity tolerance, Decreased endurance, Decreased strength, Postural dysfunction,  Abnormal gait  Visit Diagnosis: Muscle weakness (generalized)  Other abnormalities of gait and mobility     Problem List Patient Active Problem List   Diagnosis Date Noted  . Osteoporosis   . Abnormal CXR 01/20/2013  . Preop cardiovascular exam 01/20/2013  . Surgery, elective 01/18/2013  . Fracture of pubic ramus ([place]) 11/21/2011  . Hip pain, acute 11/20/2011  . Fall 11/20/2011  . Hyponatremia 11/20/2011  . Hypokalemia 11/20/2011  . Pelvic fracture ([place]) 11/20/2011  . HTN (hypertension)   . Concussion   . History of breast cancer in female     3:30 PM,12/02/18 Sherol Dade PT, [facility] [address], [place], [place], [address] Phone: [phone]   Fax:  [phone]  Name: Gina Mcgee MRN: [id number] Date of Birth: 09-30-1929

## 2018-12-07 ENCOUNTER — Ambulatory Visit: Payer: Medicare Other | Admitting: Physical Therapy

## 2018-12-07 ENCOUNTER — Other Ambulatory Visit: Payer: Self-pay

## 2018-12-07 ENCOUNTER — Encounter: Payer: Self-pay | Admitting: Physical Therapy

## 2018-12-07 DIAGNOSIS — R2689 Other abnormalities of gait and mobility: Secondary | ICD-10-CM | POA: Diagnosis not present

## 2018-12-07 DIAGNOSIS — M6281 Muscle weakness (generalized): Secondary | ICD-10-CM

## 2018-12-07 NOTE — Therapy (Signed)
[facility] [address], [place], [place], [address] Phone: [phone]   Fax:  [phone]  Physical Therapy Treatment  Patient Details  Name: Gina Mcgee MRN: [id number] Date of Birth: [birth year] Referring Provider (PT): Yaakov Guthrie, MD   Encounter Date: 12/07/2018  PT End of Session - 12/07/18 1450    Visit Number  7    Date for PT Re-Evaluation  01/05/19    PT Start Time  9201    PT Stop Time  1528    PT Time Calculation (min)  40 min    Activity Tolerance  No increased pain;Patient tolerated treatment well    Behavior During Therapy  [facility] for tasks assessed/performed       Past Medical History:  Diagnosis Date  . Brain concussion 1960  . Cancer [facility])    left-sided breast cancer  . 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 HTN (hypertension)   . Osteoporosis     Past Surgical History:  Procedure Laterality Date  . BOWEL RESECTION  approx. in 2007   polyp and formed stricture  . BREAST LUMPECTOMY  1981   left lumpectomy w/ radiation  . COLONOSCOPY W/ POLYPECTOMY    . FEMUR SURGERY  2011   left femur pinning  . JOINT REPLACEMENT    . ORIF HUMERUS FRACTURE Left 01/25/2013   Procedure: OPEN REDUCTION INTERNAL FIXATION (ORIF) DISTAL HUMERUS FRACTURE REPAIR RECONSTRUCTION AND ULNA NERVE DECOMPRESSION AND ANTERIOR TRANSPOSITION AS NECESSARY;  Surgeon: Roseanne Kaufman, MD;  Location: [place];  Service: Orthopedics;  Laterality: Left;    There were no vitals filed for this visit.  Subjective Assessment - 12/07/18 1448    Subjective  Pt states that things are going well. No issues currently.     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  Lt hip fracture/pelvic fracture, spinal contractures- 2011/2012,    Diagnostic tests  none     Patient Stated Goals  improve balance, improve strength and endurance    Currently in  Pain?  No/denies                       [facility] Adult PT Treatment/Exercise - 12/07/18 0001      Knee/Hip Exercises: Standing   Forward Step Up  Left;2 sets;10 reps    Forward Step Up Limitations  no UE support; 1 near LOB due to poor RLE clearance      Knee/Hip Exercises: Seated   Long Arc Quad  Strengthening;Both;2 sets;10 reps    Long Arc Quad Weight  4 lbs.    Marching  Strengthening;Left;Right;2 sets;10 reps    Marching Weights  4 lbs.          Balance Exercises - 12/07/18 1515      Balance Exercises: Standing   Standing Eyes Closed  1 rep;Foam/compliant surface;20 secs   tandem stance   Tandem Stance  Eyes open;Foam/compliant surface;2 reps;20 secs    SLS  Eyes open;Solid surface;3 reps;15 secs   max 3 sec on Lt, 6 sec on Rt    Retro Gait  2 reps   x10 ft each way    Sidestepping  2 reps   x33ft       PT Education - 12/07/18 1528    Education Details  technique with therex    Person(s) Educated  Patient    Methods  Explanation;Verbal  cues;Tactile cues    Comprehension  Verbalized understanding;Returned demonstration       PT Short Term Goals - 12/07/18 1501      PT SHORT TERM GOAL #1   Title  be independent in initial HEP    Time  4    Period  Weeks    Status  Achieved    Target Date  12/08/18      PT SHORT TERM GOAL #2   Title  improve strength to perform sit to stand with minimal to no UE support    Time  4    Period  Weeks    Status  Achieved    Target Date  12/08/18      PT SHORT TERM GOAL #3   Title  demonstrate controlled descent with stand to sit transition with minimal UE support    Time  4    Period  Weeks    Status  New    Target Date  12/08/18        PT Long Term Goals - 12/07/18 1501      PT LONG TERM GOAL #1   Title  be independent in advanced HEP    Time  8    Period  Weeks    Status  On-going      PT LONG TERM GOAL #2   Title  perform 5x sit to stand with minimal UE support in < or = to 20 seconds to  improve safety and balance    Baseline  11 sec, no UE    Time  8    Period  Weeks    Status  Achieved      PT LONG TERM GOAL #3   Title  improve LE strength to perform stand to sit transition with controlled descent without UE support    Time  8    Period  Weeks    Status  New      PT LONG TERM GOAL #4   Title  demonstrate 4/5 Lt LE strength to improve safety and endurance with gait at home and in the community    Time  8    Period  Weeks    Status  New            Plan - 12/07/18 1518    Clinical Impression Statement  Continued this session with therex to promote LE strength. Pt had difficulty with step ups leading with the LLE due to poor coordination of the movement and LLE fatigue. Therapist provided intermittent rest breaks as needed. Pt's single leg balance is greatly limited to only 3 sec on the Lt and up to 6 sec on the Rt. She would continue to benefit from skilled PT to address safety and steadiness with daily activity.     Rehab Potential  Good    PT Frequency  2x / week    PT Duration  8 weeks    PT Treatment/Interventions  ADLs/Self Care Home Management;Gait training;Stair training;Functional mobility training;Therapeutic activities;Therapeutic exercise;Patient/family education;Neuromuscular re-education;Balance training;Passive range of motion;Taping    PT Next Visit Plan  endurance/strength of Lt>Rt LE, dynamic balance activity    PT Home Exercise Plan  Access Code: [email or web address]    Consulted and Agree with Plan of Care  Patient       Patient will benefit from skilled therapeutic intervention in order to improve the following deficits and impairments:  Difficulty walking, Decreased balance, Decreased activity tolerance, Decreased endurance, Decreased strength, Postural dysfunction, Abnormal gait  Visit Diagnosis: Muscle weakness (generalized)  Other abnormalities of gait and mobility     Problem List Patient Active Problem List   Diagnosis Date Noted  .  Osteoporosis   . Abnormal CXR 01/20/2013  . Preop cardiovascular exam 01/20/2013  . Surgery, elective 01/18/2013  . Fracture of pubic ramus ([place]) 11/21/2011  . Hip pain, acute 11/20/2011  . Fall 11/20/2011  . Hyponatremia 11/20/2011  . Hypokalemia 11/20/2011  . Pelvic fracture ([place]) 11/20/2011  . HTN (hypertension)   . Concussion   . History of breast cancer in female    3:30 PM,12/07/18 Sherol Dade PT, [place] [facility] [place] [address], [place], [place], [address] Phone: [phone]   Fax:  [phone]  Name: Gina Mcgee MRN: [id number] Date of Birth: 1930-09-11

## 2018-12-09 ENCOUNTER — Ambulatory Visit: Payer: Medicare Other | Admitting: Physical Therapy

## 2018-12-09 ENCOUNTER — Telehealth: Payer: Self-pay | Admitting: Physical Therapy

## 2018-12-09 NOTE — Telephone Encounter (Signed)
No show. Pt states that she thought her appointment was at 2:45pm. Therapist offered the patient to be seen by antoehr therapist at 4:15pm but she declined. She will resume PT next week as scheduled.  12:58 PM,12/09/18 Sherol Dade PT, [place] [facility] [place]

## 2018-12-16 ENCOUNTER — Encounter: Payer: Medicare Other | Admitting: Physical Therapy

## 2018-12-21 ENCOUNTER — Encounter: Payer: Medicare Other | Admitting: Physical Therapy

## 2018-12-23 ENCOUNTER — Encounter: Payer: Medicare Other | Admitting: Physical Therapy

## 2018-12-28 ENCOUNTER — Encounter: Payer: Medicare Other | Admitting: Physical Therapy

## 2019-01-04 ENCOUNTER — Ambulatory Visit: Payer: Medicare Other | Admitting: Physical Therapy

## 2019-02-01 ENCOUNTER — Ambulatory Visit: Payer: Medicare Other | Attending: Family Medicine | Admitting: Physical Therapy

## 2019-02-01 ENCOUNTER — Encounter: Payer: Self-pay | Admitting: Physical Therapy

## 2019-02-01 ENCOUNTER — Other Ambulatory Visit: Payer: Self-pay

## 2019-02-01 DIAGNOSIS — R2689 Other abnormalities of gait and mobility: Secondary | ICD-10-CM | POA: Diagnosis not present

## 2019-02-01 DIAGNOSIS — R2681 Unsteadiness on feet: Secondary | ICD-10-CM | POA: Diagnosis not present

## 2019-02-01 DIAGNOSIS — M6281 Muscle weakness (generalized): Secondary | ICD-10-CM | POA: Insufficient documentation

## 2019-02-01 NOTE — Therapy (Addendum)
[facility] [address], [place], [place], [address] Phone: [phone]   Fax:  [phone]  Physical Therapy Treatment/re-eval  Patient Details  Name: Gina Mcgee MRN: [id number] Date of Birth: 28-Jul-1930 Referring Provider (PT): Yaakov Guthrie, MD  Progress Note Reporting Period  11/10/18 to 01/31/18  See note below for Objective Data and Assessment of Progress/Goals.      Encounter Date: 02/01/2019  PT End of Session - 02/01/19 1215    Visit Number  8    Date for PT Re-Evaluation  03/21/19    Authorization Type  Medicare A and B    Authorization Time Period  02/01/19 to 03/21/19    PT Start Time  1118    PT Stop Time  1200    PT Time Calculation (min)  42 min    Activity Tolerance  No increased pain;Patient tolerated treatment well    Behavior During Therapy  [facility] for tasks assessed/performed       Past Medical History:  Diagnosis Date  . Brain concussion 1960  . Cancer [facility])    left-sided breast cancer  . 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 HTN (hypertension)   . Osteoporosis     Past Surgical History:  Procedure Laterality Date  . BOWEL RESECTION  approx. in 2007   polyp and formed stricture  . BREAST LUMPECTOMY  1981   left lumpectomy w/ radiation  . COLONOSCOPY W/ POLYPECTOMY    . FEMUR SURGERY  2011   left femur pinning  . JOINT REPLACEMENT    . ORIF HUMERUS FRACTURE Left 01/25/2013   Procedure: OPEN REDUCTION INTERNAL FIXATION (ORIF) DISTAL HUMERUS FRACTURE REPAIR RECONSTRUCTION AND ULNA NERVE DECOMPRESSION AND ANTERIOR TRANSPOSITION AS NECESSARY;  Surgeon: Roseanne Kaufman, MD;  Location: [place];  Service: Orthopedics;  Laterality: Left;    There were no vitals filed for this visit.  Subjective Assessment - 02/01/19 1119    Subjective  Pt states that she has not been doing as good as she was prior to stopping her PT. She has had 2 occasions where she has stumbled but no true  falls. She has been trying to do something exercise related daily.     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  Lt hip fracture/pelvic fracture, spinal contractures- 2011/2012,    Diagnostic tests  none     Patient Stated Goals  improve balance, improve strength and endurance    Currently in Pain?  No/denies         [facility] PT Assessment - 02/01/19 0001      Assessment   Medical Diagnosis  muscular deconditioning, Lt arm contracture    Referring Provider (PT)  Yaakov Guthrie, MD    Onset Date/Surgical Date  --   chronic   Hand Dominance  Right    Prior Therapy  many years ago for Lt hand      Precautions   Precautions  Fall    Precaution Comments  osteoporosis, history of Rt breast cancer, speech deficits when fatigued      Restrictions   Weight Bearing Restrictions  No      Balance Screen   Has the patient fallen in the past 6 months  Yes    How many times?  2    Has the patient had a decrease in activity level because of a fear of falling?   Yes  Is the patient reluctant to leave their home because of a fear of falling?   No      Home Environment   Living Environment  Private residence    Living Arrangements  Alone    Available Help at Discharge  Personal care attendant   during the day   Type of [place] Access  Level entry    [place] - single point;Tub bench;Grab bars - toilet;Grab bars - tub/shower;Hand held shower head      Prior Function   Level of Independence  Independent    Vocation  Retired    Leisure  unable to resume exercise classes at the gym due to COVID-19       Cognition   Overall Cognitive Status  Within Functional Limits for tasks assessed      Posture/Postural Control   Posture/Postural Control  Postural limitations    Postural Limitations  Flexed trunk;Weight shift left;Increased thoracic kyphosis;Forward  head;Rounded Shoulders      AROM   Overall AROM   --    Overall AROM Comments  pt had difficulty coordinating movements after strength testing so we didn't test A/ROM      Strength   Overall Strength  Deficits    Overall Strength Comments  --    Strength Assessment Site  Hip;Knee;Ankle    Right/Left Hip  Right;Left    Right Hip Flexion  5/5    Right Hip Extension  4/5    Right Hip ABduction  4/5    Left Hip Flexion  4/5    Left Hip Extension  4/5    Left Hip ABduction  4/5    Right/Left Knee  Right;Left    Right Knee Flexion  5/5    Right Knee Extension  5/5    Left Knee Flexion  5/5    Left Knee Extension  5/5    Right/Left Ankle  Right;Left    Right Ankle Dorsiflexion  5/5    Left Ankle Dorsiflexion  5/5      Transfers   Transfers  --    Sit to Stand  --    Five time sit to stand comments   15 sec, poor control with first attemp, no UE     Stand to Sit  --      Ambulation/Gait   Ambulation/Gait  Yes    Ambulation/Gait Assistance  5: Supervision    Ambulation Distance (Feet)  100 Feet    Assistive device  Straight cane    Gait Pattern  Step-through pattern;Decreased arm swing - left;Decreased stride length    Gait Comments  instability with change of direction on level surface      Standardized Balance Assessment   Standardized Balance Assessment  Berg Balance Test      Berg Balance Test   Sit to Stand  Able to stand  independently using hands    Standing Unsupported  Able to stand safely 2 minutes    Sitting with Back Unsupported but Feet Supported on Floor or Stool  Able to sit safely and securely 2 minutes    Stand to Sit  Sits safely with minimal use of hands    Transfers  Able to transfer safely, minor use of hands    Standing Unsupported with Eyes Closed  Needs help to keep from falling    Standing Unsupported with Feet Together  Needs help to attain position  but able to stand for 30 seconds with feet together    From Standing, Reach Forward with Outstretched  Arm  Can reach forward >5 cm safely (2")    From Standing Position, Pick up Object from [place] to pick up shoe safely and easily    From Standing Position, Turn to Look Behind Over each Shoulder  Turn sideways only but maintains balance    Turn 360 Degrees  Able to turn 360 degrees safely but slowly    Standing Unsupported, Alternately Place Feet on Step/Stool  Able to stand independently and complete 8 steps >20 seconds    Standing Unsupported, One Foot in Front  Able to plae foot ahead of the other independently and hold 30 seconds    Standing on One Leg  Tries to lift leg/unable to hold 3 seconds but remains standing independently   Rt 5 sec, Lt 2 sec   Total Score  37                   [facility] Adult PT Treatment/Exercise - 02/01/19 0001      Knee/Hip Exercises: Standing   Hip Abduction  Stengthening;Right;Left;1 set;5 reps    Abduction Limitations  yellow TB around knees and cues for proper technique       Knee/Hip Exercises: Supine   Straight Leg Raises  Strengthening;Both;1 set;15 reps    Other Supine Knee/Hip Exercises  hip flexion isometric 3x10 sec Lt and Rt              PT Education - 02/01/19 1214    Education Details  updated and reviewed HEP    Person(s) Educated  Patient    Methods  Explanation;Verbal cues;Handout    Comprehension  Verbalized understanding;Returned demonstration       PT Short Term Goals - 12/07/18 1501      PT SHORT TERM GOAL #1   Title  be independent in initial HEP    Time  4    Period  Weeks    Status  Achieved    Target Date  12/08/18      PT SHORT TERM GOAL #2   Title  improve strength to perform sit to stand with minimal to no UE support    Time  4    Period  Weeks    Status  Achieved    Target Date  12/08/18      PT SHORT TERM GOAL #3   Title  demonstrate controlled descent with stand to sit transition with minimal UE support    Time  4    Period  Weeks    Status  New    Target Date  12/08/18         PT Long Term Goals - 02/01/19 1222      PT LONG TERM GOAL #1   Title  be independent in advanced HEP    Time  6    Period  Weeks    Status  On-going      PT LONG TERM GOAL #2   Title  perform 5x sit to stand with minimal UE support in < or = to 14 seconds to improve safety and balance    Baseline  15 sec, No UE    Time  6    Period  Weeks    Status  On-going    Target Date  03/21/19      PT LONG TERM GOAL #3   Title  improve LE strength  to perform stand to sit transition with controlled descent without UE support    Time  6    Period  Weeks    Status  New      PT LONG TERM GOAL #4   Title  demonstrate 4/5 Lt LE strength to improve safety and endurance with gait at home and in the community    Baseline  Lt hip flexion, abduction and extension 4/5    Time  6    Period  Weeks    Status  Achieved      PT LONG TERM GOAL #5   Title  Pt will demonstrate atleast 6 point improvement on BERG balance test, to reflect a decrease in her risk of falling and causing injury to herself at home.     Time  6    Period  Weeks    Status  New            Plan - 02/01/19 1216    Clinical Impression Statement  Pt was re-evaluated this visit having been out of PT for the past month secondary to restrictions from COVID-19. She has met all short term goals and atleast 2 of her long term goals at this point. Pt has been working on her program daily at home. Overall, her strength is remained the same since her most recent re-evaluation, with the exception of Lt knee extension and dorsiflexion which has improved. Her functional strength has declined slightly, evident by her completing 5x sit to stand in 15 seconds compared to her prior 11 sec. Pt has reported 2 falls over the past month and her score on the BERG balance test reflects a significant risk of falls. Pt's HEP was updated and reviewed this session and she demonstrated good understanding end of session. She would continue to benefit  from skilled PT moving forward to improve her balance, strength and functional independence and decrease her risk of falls and injury.    Rehab Potential  Good    PT Frequency  2x / week    PT Duration  6 weeks    PT Treatment/Interventions  ADLs/Self Care Home Management;Gait training;Stair training;Functional mobility training;Therapeutic activities;Therapeutic exercise;Patient/family education;Neuromuscular re-education;Balance training;Passive range of motion;Taping    PT Next Visit Plan  endurance/strength of Lt>Rt LE, dynamic balance activity    PT Home Exercise Plan  Access Code: [email or web address]    Consulted and Agree with Plan of Care  Patient       Patient will benefit from skilled therapeutic intervention in order to improve the following deficits and impairments:  Difficulty walking, Decreased balance, Decreased activity tolerance, Decreased endurance, Decreased strength, Postural dysfunction, Abnormal gait  Visit Diagnosis: Muscle weakness (generalized)  Other abnormalities of gait and mobility  Unsteadiness on feet     Problem List Patient Active Problem List   Diagnosis Date Noted  . Osteoporosis   . Abnormal CXR 01/20/2013  . Preop cardiovascular exam 01/20/2013  . Surgery, elective 01/18/2013  . Fracture of pubic ramus ([place]) 11/21/2011  . Hip pain, acute 11/20/2011  . Fall 11/20/2011  . Hyponatremia 11/20/2011  . Hypokalemia 11/20/2011  . Pelvic fracture ([place]) 11/20/2011  . HTN (hypertension)   . Concussion   . History of breast cancer in female     12:25 PM,02/01/19 Sherol Dade PT, DPT [place] [facility] [place]  [facility] [address], [place], [place], [address] Phone: [phone]   Fax:  [phone]  Name: SYBRINA LANING MRN: [id number] Date of Birth: 1929/11/18

## 2019-02-01 NOTE — Patient Instructions (Signed)
Access Code: [email or web address]  URL: [email or web address]  Date: 02/01/2019  Prepared by: Sherol Dade   Exercises  Supine Active Straight Leg Raise - 15 reps - 1 sets - 1x daily - 7x weekly  Standing Hip Abduction with Counter Support - 10 reps - 2 sets - 1x daily - 7x weekly  Standing Single Leg Stance with Unilateral Counter Support - 5 reps - 10 hold - 2x daily - 7x weekly  Hooklying Isometric Hip Flexion - 5 reps - 1 sets - 5-10 hold - 1-2x daily - 7x weekly    [facility] [address] [place],  [address] Phone # [phone] Fax [phone]

## 2019-02-03 ENCOUNTER — Ambulatory Visit: Payer: Medicare Other | Admitting: Physical Therapy

## 2019-02-03 ENCOUNTER — Encounter: Payer: Self-pay | Admitting: Physical Therapy

## 2019-02-03 ENCOUNTER — Other Ambulatory Visit: Payer: Self-pay

## 2019-02-03 DIAGNOSIS — M6281 Muscle weakness (generalized): Secondary | ICD-10-CM

## 2019-02-03 DIAGNOSIS — R2689 Other abnormalities of gait and mobility: Secondary | ICD-10-CM

## 2019-02-03 DIAGNOSIS — R2681 Unsteadiness on feet: Secondary | ICD-10-CM | POA: Diagnosis not present

## 2019-02-03 NOTE — Therapy (Signed)
[facility] [address] [place], [place], [address] Phone: [phone]   Fax:  [phone]  Physical Therapy Treatment  Patient Details  Name: Gina Mcgee MRN: [id number] Date of Birth: Sep 08, 1930 Referring Provider (PT): Yaakov Guthrie, MD   Encounter Date: 02/03/2019  PT End of Session - 02/03/19 1119    Visit Number  9    Date for PT Re-Evaluation  03/21/19    Authorization Type  Medicare A and B    Authorization Time Period  02/01/19 to 03/21/19    PT Start Time  1117    PT Stop Time  1155    PT Time Calculation (min)  38 min    Activity Tolerance  No increased pain;Patient tolerated treatment well    Behavior During Therapy  [facility] for tasks assessed/performed       Past Medical History:  Diagnosis Date  . Brain concussion 1960  . Cancer [facility])    left-sided breast cancer  . 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 HTN (hypertension)   . Osteoporosis     Past Surgical History:  Procedure Laterality Date  . BOWEL RESECTION  approx. in 2007   polyp and formed stricture  . BREAST LUMPECTOMY  1981   left lumpectomy w/ radiation  . COLONOSCOPY W/ POLYPECTOMY    . FEMUR SURGERY  2011   left femur pinning  . JOINT REPLACEMENT    . ORIF HUMERUS FRACTURE Left 01/25/2013   Procedure: OPEN REDUCTION INTERNAL FIXATION (ORIF) DISTAL HUMERUS FRACTURE REPAIR RECONSTRUCTION AND ULNA NERVE DECOMPRESSION AND ANTERIOR TRANSPOSITION AS NECESSARY;  Surgeon: Roseanne Kaufman, MD;  Location: [place];  Service: Orthopedics;  Laterality: Left;    There were no vitals filed for this visit.  Subjective Assessment - 02/03/19 1117    Subjective  Pt states that things are going well. She tried her HEP without any difficulty.     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  Lt hip fracture/pelvic fracture, spinal contractures- 2011/2012,    Diagnostic tests  none     Patient Stated Goals  improve balance, improve strength and endurance    Currently in Pain?  No/denies                       [facility] Adult PT Treatment/Exercise - 02/03/19 0001      Knee/Hip Exercises: Standing   Forward Step Up  Both;1 set;10 reps    Forward Step Up Limitations  1 UE support and opposite LE flexion      Knee/Hip Exercises: Seated   Other Seated Knee/Hip Exercises  sit to stand with red TB around knees 2x10 reps       Knee/Hip Exercises: Supine   Bridges  Both;2 sets;10 reps    Bridges Limitations  partial range seconday to muscle cramps     Other Supine Knee/Hip Exercises  adductor ball squeeze 15x5 sec hold       Knee/Hip Exercises: Sidelying   Hip ABduction  Strengthening;Both;2 sets;10 reps    Hip ABduction Limitations  1#, cues for proper technique           Balance Exercises - 02/03/19 1143      Balance Exercises: Standing   Standing Eyes Opened  Narrow base of support (BOS);Foam/compliant surface;1 rep;30 secs    Standing Eyes Closed  Narrow base of support (BOS);Foam/compliant surface;2 reps;30  secs    Cone Rotation  Foam/compliant surface;Right turn;Left turn   x10 reps each direction   Other Standing Exercises  dual task gait training with SPC and 2 and 3 step commands 2x37ft        PT Education - 02/03/19 1203    Education Details  technique with therex    Person(s) Educated  Patient    Methods  Explanation;Demonstration;Verbal cues    Comprehension  Verbalized understanding       PT Short Term Goals - 12/07/18 1501      PT SHORT TERM GOAL #1   Title  be independent in initial HEP    Time  4    Period  Weeks    Status  Achieved    Target Date  12/08/18      PT SHORT TERM GOAL #2   Title  improve strength to perform sit to stand with minimal to no UE support    Time  4    Period  Weeks    Status  Achieved    Target Date  12/08/18      PT SHORT TERM GOAL #3   Title  demonstrate  controlled descent with stand to sit transition with minimal UE support    Time  4    Period  Weeks    Status  New    Target Date  12/08/18        PT Long Term Goals - 02/01/19 1222      PT LONG TERM GOAL #1   Title  be independent in advanced HEP    Time  6    Period  Weeks    Status  On-going      PT LONG TERM GOAL #2   Title  perform 5x sit to stand with minimal UE support in < or = to 14 seconds to improve safety and balance    Baseline  15 sec, No UE    Time  6    Period  Weeks    Status  On-going    Target Date  03/21/19      PT LONG TERM GOAL #3   Title  improve LE strength to perform stand to sit transition with controlled descent without UE support    Time  6    Period  Weeks    Status  New      PT LONG TERM GOAL #4   Title  demonstrate 4/5 Lt LE strength to improve safety and endurance with gait at home and in the community    Baseline  Lt hip flexion, abduction and extension 4/5    Time  6    Period  Weeks    Status  Achieved      PT LONG TERM GOAL #5   Title  Pt will demonstrate atleast 6 point improvement on BERG balance test, to reflect a decrease in her risk of falling and causing injury to herself at home.     Time  6    Period  Weeks    Status  New            Plan - 02/03/19 1204    Clinical Impression Statement  Pt denied any significant soreness or difficulty with her updated HEP yesterday. Session focused on improving hip abductor and extensor strength, pt requiring some modifications due to LLE cramping. Therapist introduced dual task gait training this visit, which she had some difficulty with. Pt had to stop or require therapist  assistance to prevent her from falling when asked to complete 2 or 3 step commands. Will plan to work on this moving forward and pt was agreeable.    Rehab Potential  Good    PT Frequency  2x / week    PT Duration  6 weeks    PT Treatment/Interventions  ADLs/Self Care Home Management;Gait training;Stair  training;Functional mobility training;Therapeutic activities;Therapeutic exercise;Patient/family education;Neuromuscular re-education;Balance training;Passive range of motion;Taping    PT Next Visit Plan  dual task activity; hip abdutor/extensor stretngth    PT Home Exercise Plan  Access Code: [email or web address]    Consulted and Agree with Plan of Care  Patient       Patient will benefit from skilled therapeutic intervention in order to improve the following deficits and impairments:  Difficulty walking, Decreased balance, Decreased activity tolerance, Decreased endurance, Decreased strength, Postural dysfunction, Abnormal gait  Visit Diagnosis: Muscle weakness (generalized)  Other abnormalities of gait and mobility  Unsteadiness on feet     Problem List Patient Active Problem List   Diagnosis Date Noted  . Osteoporosis   . Abnormal CXR 01/20/2013  . Preop cardiovascular exam 01/20/2013  . Surgery, elective 01/18/2013  . Fracture of pubic ramus ([place]) 11/21/2011  . Hip pain, acute 11/20/2011  . Fall 11/20/2011  . Hyponatremia 11/20/2011  . Hypokalemia 11/20/2011  . Pelvic fracture ([place]) 11/20/2011  . HTN (hypertension)   . Concussion   . History of breast cancer in female     12:13 PM,02/03/19 Sherol Dade PT, DPT [place] [facility] [place] [address] [place], [place], [address] Phone: [phone]   Fax:  [phone]  Name: Gina Mcgee MRN: [id number] Date of Birth: 10/29/1929

## 2019-02-08 ENCOUNTER — Ambulatory Visit: Payer: Medicare Other | Admitting: Physical Therapy

## 2019-02-08 ENCOUNTER — Other Ambulatory Visit: Payer: Self-pay

## 2019-02-08 ENCOUNTER — Encounter: Payer: Self-pay | Admitting: Physical Therapy

## 2019-02-08 DIAGNOSIS — R2681 Unsteadiness on feet: Secondary | ICD-10-CM | POA: Diagnosis not present

## 2019-02-08 DIAGNOSIS — R2689 Other abnormalities of gait and mobility: Secondary | ICD-10-CM | POA: Diagnosis not present

## 2019-02-08 DIAGNOSIS — M6281 Muscle weakness (generalized): Secondary | ICD-10-CM

## 2019-02-08 NOTE — Therapy (Signed)
[facility] [address], [place], [place], [address] Phone: [phone]   Fax:  [phone]  Physical Therapy Treatment  Patient Details  Name: Gina Mcgee MRN: [id number] Date of Birth: 01-10-1930 Referring Provider (PT): Yaakov Guthrie, MD   Encounter Date: 02/08/2019  PT End of Session - 02/08/19 1119    Visit Number  10    Date for PT Re-Evaluation  03/21/19    Authorization Type  Medicare A and B    Authorization Time Period  02/01/19 to 03/21/19    Authorization - Visit Number  3    Authorization - Number of Visits  10    PT Start Time  4270    PT Stop Time  1155    PT Time Calculation (min)  41 min    Activity Tolerance  No increased pain;Patient tolerated treatment well    Behavior During Therapy  [facility] for tasks assessed/performed       Past Medical History:  Diagnosis Date  . Brain concussion 1960  . Cancer [facility])    left-sided breast cancer  . 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 HTN (hypertension)   . Osteoporosis     Past Surgical History:  Procedure Laterality Date  . BOWEL RESECTION  approx. in 2007   polyp and formed stricture  . BREAST LUMPECTOMY  1981   left lumpectomy w/ radiation  . COLONOSCOPY W/ POLYPECTOMY    . FEMUR SURGERY  2011   left femur pinning  . JOINT REPLACEMENT    . ORIF HUMERUS FRACTURE Left 01/25/2013   Procedure: OPEN REDUCTION INTERNAL FIXATION (ORIF) DISTAL HUMERUS FRACTURE REPAIR RECONSTRUCTION AND ULNA NERVE DECOMPRESSION AND ANTERIOR TRANSPOSITION AS NECESSARY;  Surgeon: Roseanne Kaufman, MD;  Location: [place];  Service: Orthopedics;  Laterality: Left;    There were no vitals filed for this visit.  Subjective Assessment - 02/08/19 1119    Subjective  Pt states things are going well. She has been doing her HEP regularly.     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  Lt  hip fracture/pelvic fracture, spinal contractures- 2011/2012,    Diagnostic tests  none     Patient Stated Goals  improve balance, improve strength and endurance    Currently in Pain?  No/denies                       [facility] Adult PT Treatment/Exercise - 02/08/19 0001      Knee/Hip Exercises: Machines for Strengthening   Total Gym Leg Press  Seat 6, 3 recline 2x15 reps with #50      Knee/Hip Exercises: Standing   Heel Raises  1 set;Both;20 reps    Heel Raises Limitations  foam pad with 1 UE support    Abduction Limitations  sidestepping along countertop x2 trials each direciton, yellow TB around feet     Other Standing Knee Exercises  active ankle DF on foam pad with 1 UE support 2x10 reps           Balance Exercises - 02/08/19 1124      Balance Exercises: Standing   Tandem Stance  Eyes open   trunk rotation UE reach x10 reps    Other Standing Exercises  step tap x3 step commands 2x7 trials intermittent UE support         PT Education - 02/08/19 1140  Education Details  technique with therex       PT Short Term Goals - 12/07/18 1501      PT SHORT TERM GOAL #1   Title  be independent in initial HEP    Time  4    Period  Weeks    Status  Achieved    Target Date  12/08/18      PT SHORT TERM GOAL #2   Title  improve strength to perform sit to stand with minimal to no UE support    Time  4    Period  Weeks    Status  Achieved    Target Date  12/08/18      PT SHORT TERM GOAL #3   Title  demonstrate controlled descent with stand to sit transition with minimal UE support    Time  4    Period  Weeks    Status  New    Target Date  12/08/18        PT Long Term Goals - 02/01/19 1222      PT LONG TERM GOAL #1   Title  be independent in advanced HEP    Time  6    Period  Weeks    Status  On-going      PT LONG TERM GOAL #2   Title  perform 5x sit to stand with minimal UE support in < or = to 14 seconds to improve safety and balance    Baseline   15 sec, No UE    Time  6    Period  Weeks    Status  On-going    Target Date  03/21/19      PT LONG TERM GOAL #3   Title  improve LE strength to perform stand to sit transition with controlled descent without UE support    Time  6    Period  Weeks    Status  New      PT LONG TERM GOAL #4   Title  demonstrate 4/5 Lt LE strength to improve safety and endurance with gait at home and in the community    Baseline  Lt hip flexion, abduction and extension 4/5    Time  6    Period  Weeks    Status  Achieved      PT LONG TERM GOAL #5   Title  Pt will demonstrate atleast 6 point improvement on BERG balance test, to reflect a decrease in her risk of falling and causing injury to herself at home.     Time  6    Period  Weeks    Status  New            Plan - 02/08/19 1120    Clinical Impression Statement  Continued this visit with therex progressions for LE strength improvements. Pt was able to complete leg press machine without significant difficulty and felt this would be something she could use on her own once the gym opens back up. Pt was able to complete dynamic balance activity such as multi-command step taps, but she had difficulty with trunk rotation in tandem stance. Therapist had to provide intermittent support during this to prevent falls. Ended with hip abduction update on his HEP to ensure adequate resistance is provided, and she had good understanding of this.    Rehab Potential  Good    PT Frequency  2x / week    PT Duration  6 weeks    PT  Treatment/Interventions  ADLs/Self Care Home Management;Gait training;Stair training;Functional mobility training;Therapeutic activities;Therapeutic exercise;Patient/family education;Neuromuscular re-education;Balance training;Passive range of motion;Taping    PT Next Visit Plan  dual task activity; hip abdutor/extensor stretngth    PT Home Exercise Plan  Access Code: [email or web address]    Consulted and Agree with Plan of Care  Patient        Patient will benefit from skilled therapeutic intervention in order to improve the following deficits and impairments:  Difficulty walking, Decreased balance, Decreased activity tolerance, Decreased endurance, Decreased strength, Postural dysfunction, Abnormal gait  Visit Diagnosis: Muscle weakness (generalized)  Other abnormalities of gait and mobility  Unsteadiness on feet     Problem List Patient Active Problem List   Diagnosis Date Noted  . Osteoporosis   . Abnormal CXR 01/20/2013  . Preop cardiovascular exam 01/20/2013  . Surgery, elective 01/18/2013  . Fracture of pubic ramus ([place]) 11/21/2011  . Hip pain, acute 11/20/2011  . Fall 11/20/2011  . Hyponatremia 11/20/2011  . Hypokalemia 11/20/2011  . Pelvic fracture ([place]) 11/20/2011  . HTN (hypertension)   . Concussion   . History of breast cancer in female     12:08 PM,02/08/19 Gina Mcgee PT, [place] [facility] [place] [address], [place], [place], [address] Phone: [phone]   Fax:  [phone]  Name: Gina Mcgee MRN: [id number] Date of Birth: 09-Jun-1930

## 2019-02-10 ENCOUNTER — Ambulatory Visit: Payer: Medicare Other | Admitting: Physical Therapy

## 2019-02-10 ENCOUNTER — Other Ambulatory Visit: Payer: Self-pay

## 2019-02-10 ENCOUNTER — Encounter: Payer: Self-pay | Admitting: Physical Therapy

## 2019-02-10 DIAGNOSIS — R2681 Unsteadiness on feet: Secondary | ICD-10-CM

## 2019-02-10 DIAGNOSIS — R2689 Other abnormalities of gait and mobility: Secondary | ICD-10-CM | POA: Diagnosis not present

## 2019-02-10 DIAGNOSIS — M6281 Muscle weakness (generalized): Secondary | ICD-10-CM

## 2019-02-10 NOTE — Therapy (Signed)
[facility] [address], [place], [place], [address] Phone: [phone]   Fax:  [phone]  Physical Therapy Treatment  Patient Details  Name: Gina Mcgee MRN: [id number] Date of Birth: 11-20-1929 Referring Provider (PT): Yaakov Guthrie, MD   Encounter Date: 02/10/2019  PT End of Session - 02/10/19 1154    Visit Number  11    Date for PT Re-Evaluation  03/21/19    Authorization Type  Medicare A and B    Authorization Time Period  02/01/19 to 03/21/19    Authorization - Visit Number  4    Authorization - Number of Visits  10    PT Start Time  1856    PT Stop Time  3149    PT Time Calculation (min)  41 min    Activity Tolerance  No increased pain;Patient tolerated treatment well    Behavior During Therapy  [facility] for tasks assessed/performed       Past Medical History:  Diagnosis Date  . Brain concussion 1960  . Cancer [facility])    left-sided breast cancer  . 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 HTN (hypertension)   . Osteoporosis     Past Surgical History:  Procedure Laterality Date  . BOWEL RESECTION  approx. in 2007   polyp and formed stricture  . BREAST LUMPECTOMY  1981   left lumpectomy w/ radiation  . COLONOSCOPY W/ POLYPECTOMY    . FEMUR SURGERY  2011   left femur pinning  . JOINT REPLACEMENT    . ORIF HUMERUS FRACTURE Left 01/25/2013   Procedure: OPEN REDUCTION INTERNAL FIXATION (ORIF) DISTAL HUMERUS FRACTURE REPAIR RECONSTRUCTION AND ULNA NERVE DECOMPRESSION AND ANTERIOR TRANSPOSITION AS NECESSARY;  Surgeon: Roseanne Kaufman, MD;  Location: [place];  Service: Orthopedics;  Laterality: Left;    There were no vitals filed for this visit.  Subjective Assessment - 02/10/19 1115    Subjective  Pt states that she is doing well. No issues right now.     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  Lt hip  fracture/pelvic fracture, spinal contractures- 2011/2012,    Diagnostic tests  none     Patient Stated Goals  improve balance, improve strength and endurance    Currently in Pain?  No/denies                       [facility] Adult PT Treatment/Exercise - 02/10/19 0001      Knee/Hip Exercises: Machines for Strengthening   Total Gym Leg Press  seat 6, recline 3 #65 2x10 reps; single leg #45 x10 reps      Knee/Hip Exercises: Seated   Heel Slides Limitations  2x10 reps green TB    Clamshell with TheraBand  Blue   2x10 on Rt, green with Lt for 2nd set    Marching Limitations  2x10 reps each LE     Marching Weights  4 lbs.          Balance Exercises - 02/10/19 1118      Balance Exercises: Standing   Standing Eyes Opened  Narrow base of support (BOS);Foam/compliant surface   head turns Lt/Rt and up/down   Tandem Stance  Eyes open   trunk rotation with each UE   SLS  Eyes open;5 reps   5 sec hold    Standing, One Foot on a Step  Eyes  open;Eyes closed;6 inch;3 reps;20 secs        PT Education - 02/10/19 1153    Education Details  importance of completing HEP with TB to ensure she is getting proper resistance    Person(s) Educated  Patient    Methods  Explanation    Comprehension  Verbalized understanding;Verbal cues required       PT Short Term Goals - 12/07/18 1501      PT SHORT TERM GOAL #1   Title  be independent in initial HEP    Time  4    Period  Weeks    Status  Achieved    Target Date  12/08/18      PT SHORT TERM GOAL #2   Title  improve strength to perform sit to stand with minimal to no UE support    Time  4    Period  Weeks    Status  Achieved    Target Date  12/08/18      PT SHORT TERM GOAL #3   Title  demonstrate controlled descent with stand to sit transition with minimal UE support    Time  4    Period  Weeks    Status  New    Target Date  12/08/18        PT Long Term Goals - 02/01/19 1222      PT LONG TERM GOAL #1   Title  be  independent in advanced HEP    Time  6    Period  Weeks    Status  On-going      PT LONG TERM GOAL #2   Title  perform 5x sit to stand with minimal UE support in < or = to 14 seconds to improve safety and balance    Baseline  15 sec, No UE    Time  6    Period  Weeks    Status  On-going    Target Date  03/21/19      PT LONG TERM GOAL #3   Title  improve LE strength to perform stand to sit transition with controlled descent without UE support    Time  6    Period  Weeks    Status  New      PT LONG TERM GOAL #4   Title  demonstrate 4/5 Lt LE strength to improve safety and endurance with gait at home and in the community    Baseline  Lt hip flexion, abduction and extension 4/5    Time  6    Period  Weeks    Status  Achieved      PT LONG TERM GOAL #5   Title  Pt will demonstrate atleast 6 point improvement on BERG balance test, to reflect a decrease in her risk of falling and causing injury to herself at home.     Time  6    Period  Weeks    Status  New            Plan - 02/10/19 1154    Clinical Impression Statement  Today's session continued with focus on balance and LE strength progressions. Pt was able to progress several areas of her LE strengthening without significant difficulty. She was able to maintain single leg stance for greater than 5 sec for 3 consecutive trials, which is likely due to her increased compliance with this at home. Pt was unable to complete the HEP additions reviewed at her last appointment but confirmed she  will do this over the weekend. Ended session with adequate levels of fatigue.     Rehab Potential  Good    PT Frequency  2x / week    PT Duration  6 weeks    PT Treatment/Interventions  ADLs/Self Care Home Management;Gait training;Stair training;Functional mobility training;Therapeutic activities;Therapeutic exercise;Patient/family education;Neuromuscular re-education;Balance training;Passive range of motion;Taping    PT Next Visit Plan  dual  task activity; f/u on HEP with band; hip abdutor/extensor stretngth    PT Home Exercise Plan  Access Code: [email or web address]    Consulted and Agree with Plan of Care  Patient       Patient will benefit from skilled therapeutic intervention in order to improve the following deficits and impairments:  Difficulty walking, Decreased balance, Decreased activity tolerance, Decreased endurance, Decreased strength, Postural dysfunction, Abnormal gait  Visit Diagnosis: Muscle weakness (generalized)  Other abnormalities of gait and mobility  Unsteadiness on feet     Problem List Patient Active Problem List   Diagnosis Date Noted  . Osteoporosis   . Abnormal CXR 01/20/2013  . Preop cardiovascular exam 01/20/2013  . Surgery, elective 01/18/2013  . Fracture of pubic ramus ([place]) 11/21/2011  . Hip pain, acute 11/20/2011  . Fall 11/20/2011  . Hyponatremia 11/20/2011  . Hypokalemia 11/20/2011  . Pelvic fracture ([place]) 11/20/2011  . HTN (hypertension)   . Concussion   . History of breast cancer in female     12:12 PM,02/10/19 Sherol Dade PT, [place] [facility] [place] [address], [place], [place], [address] Phone: [phone]   Fax:  [phone]  Name: Gina Mcgee MRN: [id number] Date of Birth: January 15, 1930

## 2019-02-15 ENCOUNTER — Ambulatory Visit: Payer: Medicare Other | Admitting: Physical Therapy

## 2019-02-15 ENCOUNTER — Other Ambulatory Visit: Payer: Self-pay

## 2019-02-15 DIAGNOSIS — M6281 Muscle weakness (generalized): Secondary | ICD-10-CM | POA: Diagnosis not present

## 2019-02-15 DIAGNOSIS — R2689 Other abnormalities of gait and mobility: Secondary | ICD-10-CM | POA: Diagnosis not present

## 2019-02-15 DIAGNOSIS — R2681 Unsteadiness on feet: Secondary | ICD-10-CM | POA: Diagnosis not present

## 2019-02-15 NOTE — Therapy (Signed)
[facility] [address], [place], [place], [address] Phone: [phone]   Fax:  [phone]  Physical Therapy Treatment  Patient Details  Name: Gina Mcgee MRN: [id number] Date of Birth: Mar 01, 1930 Referring Provider (PT): Yaakov Guthrie, MD   Encounter Date: 02/15/2019  PT End of Session - 02/15/19 1107    Visit Number  12    Date for PT Re-Evaluation  03/21/19    Authorization Type  Medicare A and B    Authorization Time Period  02/01/19 to 03/21/19    Authorization - Visit Number  5    Authorization - Number of Visits  10    PT Start Time  7096    PT Stop Time  1055    PT Time Calculation (min)  40 min    Activity Tolerance  No increased pain;Patient tolerated treatment well    Behavior During Therapy  [facility] for tasks assessed/performed       Past Medical History:  Diagnosis Date  . Brain concussion 1960  . Cancer [facility])    left-sided breast cancer  . 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 HTN (hypertension)   . Osteoporosis     Past Surgical History:  Procedure Laterality Date  . BOWEL RESECTION  approx. in 2007   polyp and formed stricture  . BREAST LUMPECTOMY  1981   left lumpectomy w/ radiation  . COLONOSCOPY W/ POLYPECTOMY    . FEMUR SURGERY  2011   left femur pinning  . JOINT REPLACEMENT    . ORIF HUMERUS FRACTURE Left 01/25/2013   Procedure: OPEN REDUCTION INTERNAL FIXATION (ORIF) DISTAL HUMERUS FRACTURE REPAIR RECONSTRUCTION AND ULNA NERVE DECOMPRESSION AND ANTERIOR TRANSPOSITION AS NECESSARY;  Surgeon: Roseanne Kaufman, MD;  Location: [place];  Service: Orthopedics;  Laterality: Left;    There were no vitals filed for this visit.  Subjective Assessment - 02/15/19 1018    Subjective  Pt states that things are going well. She has been trying her band more at home.     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  Lt hip fracture/pelvic fracture, spinal contractures- 2011/2012,    Diagnostic tests  none     Patient Stated Goals  improve balance, improve strength and endurance    Currently in Pain?  No/denies              [facility] Adult PT Treatment/Exercise - 02/15/19 0001      Ambulation/Gait   Gait Comments  ambulating 71ft with dual task turn and stop or head turns x6 trials (CGA);  ambulating with direction changes x121ft using SPC, therapist providing close support to prevent LOB with noted fatigue  Pt holding cup of water with forward step forward/back 2x10 reps without UE support      Knee/Hip Exercises: Machines for Strengthening   Total Gym Leg Press  seat 6 incline 3: BLE #70 2x15 reps, single leg #45 x20 reps on Lt       Knee/Hip Exercises: Seated   Heel Slides Limitations  2x12 reps green TB          Balance Exercises - 02/15/19 1049      Balance Exercises: Standing   Other Standing Exercises  forward step over line 2x10 reps each LE while holding cup of water           PT Short Term Goals - 12/07/18 1501  PT SHORT TERM GOAL #1   Title  be independent in initial HEP    Time  4    Period  Weeks    Status  Achieved    Target Date  12/08/18      PT SHORT TERM GOAL #2   Title  improve strength to perform sit to stand with minimal to no UE support    Time  4    Period  Weeks    Status  Achieved    Target Date  12/08/18      PT SHORT TERM GOAL #3   Title  demonstrate controlled descent with stand to sit transition with minimal UE support    Time  4    Period  Weeks    Status  New    Target Date  12/08/18        PT Long Term Goals - 02/01/19 1222      PT LONG TERM GOAL #1   Title  be independent in advanced HEP    Time  6    Period  Weeks    Status  On-going      PT LONG TERM GOAL #2   Title  perform 5x sit to stand with minimal UE support in < or = to 14 seconds to improve safety and balance    Baseline  15 sec, No UE    Time  6     Period  Weeks    Status  On-going    Target Date  03/21/19      PT LONG TERM GOAL #3   Title  improve LE strength to perform stand to sit transition with controlled descent without UE support    Time  6    Period  Weeks    Status  New      PT LONG TERM GOAL #4   Title  demonstrate 4/5 Lt LE strength to improve safety and endurance with gait at home and in the community    Baseline  Lt hip flexion, abduction and extension 4/5    Time  6    Period  Weeks    Status  Achieved      PT LONG TERM GOAL #5   Title  Pt will demonstrate atleast 6 point improvement on BERG balance test, to reflect a decrease in her risk of falling and causing injury to herself at home.     Time  6    Period  Weeks    Status  New            Plan - 02/15/19 1108    Clinical Impression Statement  Pt was able to complete her HEP with the correct resistance bands over the weekend. She denied issues with this. Completed LE strengthening prior to balance activity this visit in order to promote balance and stability with LE fatigue. Pt had significant difficulty completing dual task walking activity, and she required contact guard assist to prevent LOB with this. Will continue to address dual task balance during future sessions to promote safety during the day at home and in the community.     Rehab Potential  Good    PT Frequency  2x / week    PT Duration  6 weeks    PT Treatment/Interventions  ADLs/Self Care Home Management;Gait training;Stair training;Functional mobility training;Therapeutic activities;Therapeutic exercise;Patient/family education;Neuromuscular re-education;Balance training;Passive range of motion;Taping    PT Next Visit Plan  dual task activity; f/u on HEP with band; hip abdutor/extensor  strength    PT Home Exercise Plan  Access Code: [email or web address]    Consulted and Agree with Plan of Care  Patient       Patient will benefit from skilled therapeutic intervention in order to improve the  following deficits and impairments:  Difficulty walking, Decreased balance, Decreased activity tolerance, Decreased endurance, Decreased strength, Postural dysfunction, Abnormal gait  Visit Diagnosis: Muscle weakness (generalized)  Other abnormalities of gait and mobility  Unsteadiness on feet     Problem List Patient Active Problem List   Diagnosis Date Noted  . Osteoporosis   . Abnormal CXR 01/20/2013  . Preop cardiovascular exam 01/20/2013  . Surgery, elective 01/18/2013  . Fracture of pubic ramus ([place]) 11/21/2011  . Hip pain, acute 11/20/2011  . Fall 11/20/2011  . Hyponatremia 11/20/2011  . Hypokalemia 11/20/2011  . Pelvic fracture ([place]) 11/20/2011  . HTN (hypertension)   . Concussion   . History of breast cancer in female     11:21 AM,02/15/19 Sherol Dade PT, [place] [facility] [place] [address], [place], [place], [address] Phone: [phone]   Fax:  [phone]  Name: Gina Mcgee MRN: [id number] Date of Birth: 1930/05/13

## 2019-02-17 ENCOUNTER — Encounter: Payer: Self-pay | Admitting: Physical Therapy

## 2019-02-17 ENCOUNTER — Other Ambulatory Visit: Payer: Self-pay

## 2019-02-17 ENCOUNTER — Ambulatory Visit: Payer: Medicare Other | Admitting: Physical Therapy

## 2019-02-17 DIAGNOSIS — M6281 Muscle weakness (generalized): Secondary | ICD-10-CM

## 2019-02-17 DIAGNOSIS — R2689 Other abnormalities of gait and mobility: Secondary | ICD-10-CM | POA: Diagnosis not present

## 2019-02-17 DIAGNOSIS — R2681 Unsteadiness on feet: Secondary | ICD-10-CM

## 2019-02-17 NOTE — Therapy (Signed)
[facility] [address], [place], [place], [address] Phone: [phone]   Fax:  [phone]  Physical Therapy Treatment  Patient Details  Name: Gina Mcgee MRN: [id number] Date of Birth: [birth year] Referring Provider (PT): Yaakov Guthrie, MD   Encounter Date: 02/17/2019  PT End of Session - 02/17/19 1243    Visit Number  13    Date for PT Re-Evaluation  03/21/19    Authorization Type  Medicare A and B    Authorization Time Period  02/01/19 to 03/21/19    Authorization - Visit Number  6    Authorization - Number of Visits  10    PT Start Time  1016    PT Stop Time  1058    PT Time Calculation (min)  42 min    Activity Tolerance  No increased pain;Patient tolerated treatment well    Behavior During Therapy  [facility] for tasks assessed/performed       Past Medical History:  Diagnosis Date  . Brain concussion 1960  . Cancer [facility])    left-sided breast cancer  . 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 HTN (hypertension)   . Osteoporosis     Past Surgical History:  Procedure Laterality Date  . BOWEL RESECTION  approx. in 2007   polyp and formed stricture  . BREAST LUMPECTOMY  1981   left lumpectomy w/ radiation  . COLONOSCOPY W/ POLYPECTOMY    . FEMUR SURGERY  2011   left femur pinning  . JOINT REPLACEMENT    . ORIF HUMERUS FRACTURE Left 01/25/2013   Procedure: OPEN REDUCTION INTERNAL FIXATION (ORIF) DISTAL HUMERUS FRACTURE REPAIR RECONSTRUCTION AND ULNA NERVE DECOMPRESSION AND ANTERIOR TRANSPOSITION AS NECESSARY;  Surgeon: Roseanne Kaufman, MD;  Location: [place];  Service: Orthopedics;  Laterality: Left;    There were no vitals filed for this visit.  Subjective Assessment - 02/17/19 1115    Subjective  Pt states things are good. She brought her HEP to see what she needs to update.     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  Lt hip fracture/pelvic fracture, spinal contractures- 2011/2012,    Diagnostic tests  none     Patient Stated Goals  improve balance, improve strength and endurance    Currently in Pain?  No/denies                       [facility] Adult PT Treatment/Exercise - 02/17/19 0001      Knee/Hip Exercises: Standing   Other Standing Knee Exercises  sport cord side stepping Lt and Rt with #15 and CGA to prevent LOB       Knee/Hip Exercises: Seated   Heel Slides Limitations  2x12 reps green TB   last set 3 sec eccentric      Knee/Hip Exercises: Supine   Bridges Limitations  straight leg bridge off 8" box 10x5 sec hold     Straight Leg Raises  Both;1 set;15 reps             PT Education - 02/17/19 1242    Education Details  technique with therex; updates to HEP sets/reps     Person(s) Educated  Patient    Methods  Explanation;Handout;Verbal cues    Comprehension  Verbalized understanding;Returned demonstration       PT Short Term Goals - 12/07/18 1501  PT SHORT TERM GOAL #1   Title  be independent in initial HEP    Time  4    Period  Weeks    Status  Achieved    Target Date  12/08/18      PT SHORT TERM GOAL #2   Title  improve strength to perform sit to stand with minimal to no UE support    Time  4    Period  Weeks    Status  Achieved    Target Date  12/08/18      PT SHORT TERM GOAL #3   Title  demonstrate controlled descent with stand to sit transition with minimal UE support    Time  4    Period  Weeks    Status  New    Target Date  12/08/18        PT Long Term Goals - 02/01/19 1222      PT LONG TERM GOAL #1   Title  be independent in advanced HEP    Time  6    Period  Weeks    Status  On-going      PT LONG TERM GOAL #2   Title  perform 5x sit to stand with minimal UE support in < or = to 14 seconds to improve safety and balance    Baseline  15 sec, No UE    Time  6    Period  Weeks    Status  On-going    Target Date   03/21/19      PT LONG TERM GOAL #3   Title  improve LE strength to perform stand to sit transition with controlled descent without UE support    Time  6    Period  Weeks    Status  New      PT LONG TERM GOAL #4   Title  demonstrate 4/5 Lt LE strength to improve safety and endurance with gait at home and in the community    Baseline  Lt hip flexion, abduction and extension 4/5    Time  6    Period  Weeks    Status  Achieved      PT LONG TERM GOAL #5   Title  Pt will demonstrate atleast 6 point improvement on BERG balance test, to reflect a decrease in her risk of falling and causing injury to herself at home.     Time  6    Period  Weeks    Status  New            Plan - 02/17/19 1244    Clinical Impression Statement  Focused on updating pt's HEP this session, with review of several exercises to ensure she is using proper technique. Pt was encouraged to increase repetitions with her straight leg raise and required some encouragement to do this while experiencing LE fatigue. Introduced side stepping with resistance in order to challenge her balance, and pt required contact guard assist with this due to 2 near LOB. Ended with full understanding of HEP and reports of LE fatigue only.     Rehab Potential  Good    PT Frequency  2x / week    PT Duration  6 weeks    PT Treatment/Interventions  ADLs/Self Care Home Management;Gait training;Stair training;Functional mobility training;Therapeutic activities;Therapeutic exercise;Patient/family education;Neuromuscular re-education;Balance training;Passive range of motion;Taping    PT Next Visit Plan  dual task activity; f/u on HEP with band; hip abdutor/extensor strength  PT Home Exercise Plan  Access Code: [email or web address]    Consulted and Agree with Plan of Care  Patient       Patient will benefit from skilled therapeutic intervention in order to improve the following deficits and impairments:  Difficulty walking, Decreased balance, Decreased  activity tolerance, Decreased endurance, Decreased strength, Postural dysfunction, Abnormal gait  Visit Diagnosis: Muscle weakness (generalized)  Other abnormalities of gait and mobility  Unsteadiness on feet     Problem List Patient Active Problem List   Diagnosis Date Noted  . Osteoporosis   . Abnormal CXR 01/20/2013  . Preop cardiovascular exam 01/20/2013  . Surgery, elective 01/18/2013  . Fracture of pubic ramus ([place]) 11/21/2011  . Hip pain, acute 11/20/2011  . Fall 11/20/2011  . Hyponatremia 11/20/2011  . Hypokalemia 11/20/2011  . Pelvic fracture ([place]) 11/20/2011  . HTN (hypertension)   . Concussion   . History of breast cancer in female     1:01 PM,02/17/19 Sherol Dade PT, [place] [facility] [place]  [place] [address] [place], [place], [address] Phone: [phone]   Fax:  [phone]  Name: Gina Mcgee MRN: [id number] Date of Birth: 1930-01-23

## 2019-02-17 NOTE — Patient Instructions (Signed)
Access Code: [email or web address]  URL: [email or web address]  Date: 02/17/2019  Prepared by: Sherol Dade   Exercises  Seated Hip Abduction - 10 reps - 2 sets - 2x daily - 7x weekly  Supine Active Straight Leg Raise - 15 reps - 1 sets - 2x daily - 7x weekly  Supine Bridge - 10 reps - 2 sets - 1x daily - 7x weekly  Goblet Squat with Kettlebell - 15 reps - 2 sets - 1x daily - 7x weekly  Single Leg Heel Raise - 20 reps - 2x daily - 7x weekly    [facility] [address] [place], [place] [address] Phone # [phone] Fax [phone]

## 2019-02-22 ENCOUNTER — Other Ambulatory Visit: Payer: Self-pay

## 2019-02-22 ENCOUNTER — Encounter: Payer: Self-pay | Admitting: Physical Therapy

## 2019-02-22 ENCOUNTER — Ambulatory Visit: Payer: Medicare Other | Attending: Family Medicine | Admitting: Physical Therapy

## 2019-02-22 DIAGNOSIS — M6281 Muscle weakness (generalized): Secondary | ICD-10-CM | POA: Diagnosis not present

## 2019-02-22 DIAGNOSIS — R2681 Unsteadiness on feet: Secondary | ICD-10-CM | POA: Diagnosis not present

## 2019-02-22 DIAGNOSIS — R2689 Other abnormalities of gait and mobility: Secondary | ICD-10-CM

## 2019-02-22 NOTE — Therapy (Signed)
[facility] [address], [place], [place], [address] Phone: [phone]   Fax:  [phone]  Physical Therapy Treatment  Patient Details  Name: Gina Mcgee MRN: [id number] Date of Birth: 1930/06/26 Referring Provider (PT): Yaakov Guthrie, MD   Encounter Date: 02/22/2019  PT End of Session - 02/22/19 1138    Visit Number  14    Date for PT Re-Evaluation  03/21/19    Authorization Type  Medicare A and B    Authorization Time Period  02/01/19 to 03/21/19    Authorization - Visit Number  7    Authorization - Number of Visits  10    PT Start Time  1100    PT Stop Time  1140    PT Time Calculation (min)  40 min    Activity Tolerance  No increased pain;Patient tolerated treatment well    Behavior During Therapy  [facility] for tasks assessed/performed       Past Medical History:  Diagnosis Date  . Brain concussion 1960  . Cancer [facility])    left-sided breast cancer  . 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 HTN (hypertension)   . Osteoporosis     Past Surgical History:  Procedure Laterality Date  . BOWEL RESECTION  approx. in 2007   polyp and formed stricture  . BREAST LUMPECTOMY  1981   left lumpectomy w/ radiation  . COLONOSCOPY W/ POLYPECTOMY    . FEMUR SURGERY  2011   left femur pinning  . JOINT REPLACEMENT    . ORIF HUMERUS FRACTURE Left 01/25/2013   Procedure: OPEN REDUCTION INTERNAL FIXATION (ORIF) DISTAL HUMERUS FRACTURE REPAIR RECONSTRUCTION AND ULNA NERVE DECOMPRESSION AND ANTERIOR TRANSPOSITION AS NECESSARY;  Surgeon: Roseanne Kaufman, MD;  Location: [place];  Service: Orthopedics;  Laterality: Left;    There were no vitals filed for this visit.  Subjective Assessment - 02/22/19 1102    Subjective  Pt states things are good. She has been working on her HEP.     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  Lt hip  fracture/pelvic fracture, spinal contractures- 2011/2012,    Diagnostic tests  none     Patient Stated Goals  improve balance, improve strength and endurance    Currently in Pain?  No/denies         [facility] PT Assessment - 02/22/19 0001      Transfers   Five time sit to stand comments   12 sec, no UE support                    [facility] Adult PT Treatment/Exercise - 02/22/19 0001      Ambulation/Gait   Gait Comments  forward ambulation with head turns Lt/Rt on command x141ft      Knee/Hip Exercises: Standing   Heel Raises  Both;1 set    Heel Raises Limitations  25 reps     Knee Flexion  2 sets;Both;10 reps    Knee Flexion Limitations  2#    Hip Flexion  Both;Stengthening;2 sets;10 reps;Knee bent    Hip Flexion Limitations  2#, encouraged to control lowering, no UE support      Forward Step Up  Both;1 set;10 reps    Forward Step Up Limitations  1 UE for support, wearing 2# ankle weights       Knee/Hip Exercises: Seated   Long Arc [employer]  Strengthening;Both;1 set;20 reps    Long Arc Quad Weight  4 lbs.    Marching  Strengthening;Left;Right;2 sets;10 reps    Marching Limitations  slow eccentric     Marching Weights  4 lbs.             PT Education - 02/22/19 1257    Education Details  technique with therex    Person(s) Educated  Patient    Methods  Explanation    Comprehension  Verbalized understanding       PT Short Term Goals - 02/22/19 1136      PT SHORT TERM GOAL #1   Title  be independent in initial HEP    Time  4    Period  Weeks    Status  Achieved    Target Date  12/08/18      PT SHORT TERM GOAL #2   Title  improve strength to perform sit to stand with minimal to no UE support    Time  4    Period  Weeks    Status  Achieved    Target Date  12/08/18      PT SHORT TERM GOAL #3   Title  demonstrate controlled descent with stand to sit transition with minimal UE support    Time  4    Period  Weeks    Status  Achieved    Target Date   12/08/18        PT Long Term Goals - 02/22/19 1137      PT LONG TERM GOAL #1   Title  be independent in advanced HEP    Time  6    Period  Weeks    Status  On-going      PT LONG TERM GOAL #2   Title  perform 5x sit to stand with minimal UE support in < or = to 14 seconds to improve safety and balance    Baseline  12 sec, no UE     Time  6    Period  Weeks    Status  Achieved      PT LONG TERM GOAL #3   Title  improve LE strength to perform stand to sit transition with controlled descent without UE support    Time  6    Period  Weeks    Status  New      PT LONG TERM GOAL #4   Title  demonstrate 4/5 Lt LE strength to improve safety and endurance with gait at home and in the community    Baseline  Lt hip flexion, abduction and extension 4/5    Time  6    Period  Weeks    Status  Achieved      PT LONG TERM GOAL #5   Title  Pt will demonstrate atleast 6 point improvement on BERG balance test, to reflect a decrease in her risk of falling and causing injury to herself at home.     Time  6    Period  Weeks    Status  New            Plan - 02/22/19 1221    Clinical Impression Statement  Pt is progressing well, demonstrating 5x sit to stand in 12 sec which is 3 sec improved and closer to her performance before COVID-19. Focused on progressing LE strength and endurance, pt requiring intermittent rest breaks during her session due to LE fatigue. Pt had increased difficulty with dual task  gait end of session secondary to LE fatigue. Will continue with current POC and progress LE strength, endurance and balance for safety at home and in the community.      Rehab Potential  Good    PT Frequency  2x / week    PT Duration  6 weeks    PT Treatment/Interventions  ADLs/Self Care Home Management;Gait training;Stair training;Functional mobility training;Therapeutic activities;Therapeutic exercise;Patient/family education;Neuromuscular re-education;Balance training;Passive range of  motion;Taping    PT Next Visit Plan  dual task activity; LE endurance; hip abdutor/extensor strength    PT Home Exercise Plan  Access Code: [email or web address]    Consulted and Agree with Plan of Care  Patient       Patient will benefit from skilled therapeutic intervention in order to improve the following deficits and impairments:  Difficulty walking, Decreased balance, Decreased activity tolerance, Decreased endurance, Decreased strength, Postural dysfunction, Abnormal gait  Visit Diagnosis: Muscle weakness (generalized)  Other abnormalities of gait and mobility  Unsteadiness on feet     Problem List Patient Active Problem List   Diagnosis Date Noted  . Osteoporosis   . Abnormal CXR 01/20/2013  . Preop cardiovascular exam 01/20/2013  . Surgery, elective 01/18/2013  . Fracture of pubic ramus ([place]) 11/21/2011  . Hip pain, acute 11/20/2011  . Fall 11/20/2011  . Hyponatremia 11/20/2011  . Hypokalemia 11/20/2011  . Pelvic fracture ([place]) 11/20/2011  . HTN (hypertension)   . Concussion   . History of breast cancer in female     1:00 PM,02/22/19 Sherol Dade PT, [place] at [place] [address] [place], [place], [address] Phone: [phone]   Fax:  [phone]  Name: Gina Mcgee MRN: [id number] Date of Birth: 1930-08-30

## 2019-02-25 ENCOUNTER — Encounter: Payer: Self-pay | Admitting: Physical Therapy

## 2019-02-25 ENCOUNTER — Ambulatory Visit: Payer: Medicare Other | Admitting: Physical Therapy

## 2019-02-25 ENCOUNTER — Other Ambulatory Visit: Payer: Self-pay

## 2019-02-25 DIAGNOSIS — R2681 Unsteadiness on feet: Secondary | ICD-10-CM | POA: Diagnosis not present

## 2019-02-25 DIAGNOSIS — R2689 Other abnormalities of gait and mobility: Secondary | ICD-10-CM | POA: Diagnosis not present

## 2019-02-25 DIAGNOSIS — M6281 Muscle weakness (generalized): Secondary | ICD-10-CM

## 2019-02-25 NOTE — Therapy (Signed)
[facility] [address], [place], [place], [address] Phone: [phone]   Fax:  [phone]  Physical Therapy Treatment  Patient Details  Name: Gina Mcgee MRN: [id number] Date of Birth: 1930-04-01 Referring Provider (PT): Yaakov Guthrie, MD   Encounter Date: 02/25/2019  PT End of Session - 02/25/19 1140    Visit Number  15    Date for PT Re-Evaluation  03/21/19    Authorization Type  Medicare A and B    Authorization Time Period  02/01/19 to 03/21/19    Authorization - Visit Number  8    Authorization - Number of Visits  10    PT Start Time  1101    PT Stop Time  2671    PT Time Calculation (min)  41 min    Activity Tolerance  No increased pain;Patient tolerated treatment well    Behavior During Therapy  [facility] for tasks assessed/performed       Past Medical History:  Diagnosis Date  . Brain concussion 1960  . Cancer [facility])    left-sided breast cancer  . 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 HTN (hypertension)   . Osteoporosis     Past Surgical History:  Procedure Laterality Date  . BOWEL RESECTION  approx. in 2007   polyp and formed stricture  . BREAST LUMPECTOMY  1981   left lumpectomy w/ radiation  . COLONOSCOPY W/ POLYPECTOMY    . FEMUR SURGERY  2011   left femur pinning  . JOINT REPLACEMENT    . ORIF HUMERUS FRACTURE Left 01/25/2013   Procedure: OPEN REDUCTION INTERNAL FIXATION (ORIF) DISTAL HUMERUS FRACTURE REPAIR RECONSTRUCTION AND ULNA NERVE DECOMPRESSION AND ANTERIOR TRANSPOSITION AS NECESSARY;  Surgeon: Roseanne Kaufman, MD;  Location: [place];  Service: Orthopedics;  Laterality: Left;    There were no vitals filed for this visit.  Subjective Assessment - 02/25/19 1104    Subjective  Pt states that she is having a slow day. Yesterday was much worse, but she is not sure why. She was not able to do he HEP much yesterday.     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  Lt hip fracture/pelvic fracture, spinal contractures- 2011/2012,    Diagnostic tests  none     Patient Stated Goals  improve balance, improve strength and endurance    Currently in Pain?  No/denies                       [facility] Adult PT Treatment/Exercise - 02/25/19 0001      Knee/Hip Exercises: Aerobic   Nustep  L1 x5 min, to promote LLE endurance PT present to discuss progress      Knee/Hip Exercises: Seated   Long Arc Quad  Left;Right;Strengthening;2 sets;15 reps    Long Arc Quad Weight  4 lbs.    Long Arc Quad Limitations  10 sec hold last rep    Clamshell with TheraBand  Green   2x15 reps    Marching  Strengthening;Left;Right;2 sets;10 reps    Marching Limitations  10 sec hold last rep; Lt 3# and decrease to 2#     Marching Weights  4 lbs.          Balance Exercises - 02/25/19 1124      Balance Exercises: Standing   Step Over Hurdles / Cones  lateral step over line 3x5  reps each direction; forward step and back alternating legs 2x10 reps (pool noodle)        PT Education - 02/25/19 1139    Education Details  technique with therex    Person(s) Educated  Patient    Methods  Explanation;Verbal cues    Comprehension  Returned demonstration;Verbalized understanding       PT Short Term Goals - 02/22/19 1136      PT SHORT TERM GOAL #1   Title  be independent in initial HEP    Time  4    Period  Weeks    Status  Achieved    Target Date  12/08/18      PT SHORT TERM GOAL #2   Title  improve strength to perform sit to stand with minimal to no UE support    Time  4    Period  Weeks    Status  Achieved    Target Date  12/08/18      PT SHORT TERM GOAL #3   Title  demonstrate controlled descent with stand to sit transition with minimal UE support    Time  4    Period  Weeks    Status  Achieved    Target Date  12/08/18        PT Long Term Goals - 02/22/19 1137      PT LONG  TERM GOAL #1   Title  be independent in advanced HEP    Time  6    Period  Weeks    Status  On-going      PT LONG TERM GOAL #2   Title  perform 5x sit to stand with minimal UE support in < or = to 14 seconds to improve safety and balance    Baseline  12 sec, no UE     Time  6    Period  Weeks    Status  Achieved      PT LONG TERM GOAL #3   Title  improve LE strength to perform stand to sit transition with controlled descent without UE support    Time  6    Period  Weeks    Status  New      PT LONG TERM GOAL #4   Title  demonstrate 4/5 Lt LE strength to improve safety and endurance with gait at home and in the community    Baseline  Lt hip flexion, abduction and extension 4/5    Time  6    Period  Weeks    Status  Achieved      PT LONG TERM GOAL #5   Title  Pt will demonstrate atleast 6 point improvement on BERG balance test, to reflect a decrease in her risk of falling and causing injury to herself at home.     Time  6    Period  Weeks    Status  New            Plan - 02/25/19 1140    Clinical Impression Statement  Pt arrived with reports of increased fatigue over the past 2 days. Session was altered to provide more rest breaks, and pt did demonstrate increased difficulty with LLE marching. Pt completed standing balance activity with step over hurdles and required contact guard assist with this. She had an adequate amount of fatigue at the end of today's session and therapist walked her out to her car to ensure safety. Will continue with current POC.  Rehab Potential  Good    PT Frequency  2x / week    PT Duration  6 weeks    PT Treatment/Interventions  ADLs/Self Care Home Management;Gait training;Stair training;Functional mobility training;Therapeutic activities;Therapeutic exercise;Patient/family education;Neuromuscular re-education;Balance training;Passive range of motion;Taping    PT Next Visit Plan  dual task activity; LE endurance; hip abdutor/extensor strength     PT Home Exercise Plan  Access Code: [email or web address]    Consulted and Agree with Plan of Care  Patient       Patient will benefit from skilled therapeutic intervention in order to improve the following deficits and impairments:  Difficulty walking, Decreased balance, Decreased activity tolerance, Decreased endurance, Decreased strength, Postural dysfunction, Abnormal gait  Visit Diagnosis: Muscle weakness (generalized)  Other abnormalities of gait and mobility  Unsteadiness on feet     Problem List Patient Active Problem List   Diagnosis Date Noted  . Osteoporosis   . Abnormal CXR 01/20/2013  . Preop cardiovascular exam 01/20/2013  . Surgery, elective 01/18/2013  . Fracture of pubic ramus ([place]) 11/21/2011  . Hip pain, acute 11/20/2011  . Fall 11/20/2011  . Hyponatremia 11/20/2011  . Hypokalemia 11/20/2011  . Pelvic fracture ([place]) 11/20/2011  . HTN (hypertension)   . Concussion   . History of breast cancer in female     12:27 PM,02/25/19 Lowella Kindley PT, DPT [facility] [address], [place], [place], [address] Phone: [phone]   Fax:  [phone]  Name: Gina Mcgee MRN: [id number] Date of Birth: 11/26/1929

## 2019-03-01 ENCOUNTER — Encounter: Payer: Self-pay | Admitting: Physical Therapy

## 2019-03-01 ENCOUNTER — Other Ambulatory Visit: Payer: Self-pay

## 2019-03-01 ENCOUNTER — Ambulatory Visit: Payer: Medicare Other | Admitting: Physical Therapy

## 2019-03-01 DIAGNOSIS — M6281 Muscle weakness (generalized): Secondary | ICD-10-CM | POA: Diagnosis not present

## 2019-03-01 DIAGNOSIS — R2689 Other abnormalities of gait and mobility: Secondary | ICD-10-CM

## 2019-03-01 DIAGNOSIS — R2681 Unsteadiness on feet: Secondary | ICD-10-CM

## 2019-03-01 NOTE — Therapy (Signed)
[facility] [address] [place], [place], [address] Phone: [phone]   Fax:  [phone]  Physical Therapy Treatment  Patient Details  Name: Gina Mcgee MRN: [id number] Date of Birth: May 31, 1930 Referring Provider (PT): Yaakov Guthrie, MD   Encounter Date: 03/01/2019  PT End of Session - 03/01/19 0908    Visit Number  16    Date for PT Re-Evaluation  03/21/19    Authorization Type  Medicare A and B    Authorization Time Period  02/01/19 to 03/21/19    Authorization - Visit Number  9    Authorization - Number of Visits  10    PT Start Time  0900    PT Stop Time  0940    PT Time Calculation (min)  40 min    Activity Tolerance  No increased pain;Patient tolerated treatment well    Behavior During Therapy  [facility] for tasks assessed/performed       Past Medical History:  Diagnosis Date  . Brain concussion 1960  . Cancer [facility])    left-sided breast cancer  . 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 HTN (hypertension)   . Osteoporosis     Past Surgical History:  Procedure Laterality Date  . BOWEL RESECTION  approx. in 2007   polyp and formed stricture  . BREAST LUMPECTOMY  1981   left lumpectomy w/ radiation  . COLONOSCOPY W/ POLYPECTOMY    . FEMUR SURGERY  2011   left femur pinning  . JOINT REPLACEMENT    . ORIF HUMERUS FRACTURE Left 01/25/2013   Procedure: OPEN REDUCTION INTERNAL FIXATION (ORIF) DISTAL HUMERUS FRACTURE REPAIR RECONSTRUCTION AND ULNA NERVE DECOMPRESSION AND ANTERIOR TRANSPOSITION AS NECESSARY;  Surgeon: Roseanne Kaufman, MD;  Location: [place];  Service: Orthopedics;  Laterality: Left;    There were no vitals filed for this visit.  Subjective Assessment - 03/01/19 0903    Subjective  Pt states that things are better today.     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  Lt hip fracture/pelvic fracture,  spinal contractures- 2011/2012,    Diagnostic tests  none     Patient Stated Goals  improve balance, improve strength and endurance    Currently in Pain?  No/denies         [facility] PT Assessment - 03/01/19 0001      Berg Balance Test   Standing Unsupported, One Foot in Front  --   20 sec in tandem                  [facility] Adult PT Treatment/Exercise - 03/01/19 0001      Knee/Hip Exercises: Aerobic   Nustep  L2 increased 1 level every 2 min, x6 min total       Knee/Hip Exercises: Seated   Long Arc Quad  Left;Right;Strengthening;2 sets;15 reps    Long Arc Quad Weight  --   Rt 5lb, Lt 3lb   Long Arc Quad Limitations  10 sec hold last rep    Clamshell with TheraBand  Green   2x15 reps    Marching  Strengthening;Left;Right;2 sets;10 reps    Marching Limitations  10 sec last rep    Marching Weights  --   Rt 5lb, Lt 3lb         Balance Exercises - 03/01/19 0920      Balance Exercises: Standing   Tandem  Stance  1 rep;20 secs    SLS  Eyes open;2 reps;15 secs   5 sec on each LE    Gait with Head Turns  Forward;1 rep   with SPC Lt/Rt commands   Tandem Gait  Forward;1 rep   x20 ft with SPC   Step Over Hurdles / Cones  lateral step over pool noddle 2x10 reps each direction; forward step and back alternating legs 2x10 reps over pool noodle        PT Education - 03/01/19 0912    Education Details  technique with therex     Person(s) Educated  Patient    Methods  Verbal cues;Explanation    Comprehension  Verbalized understanding;Returned demonstration       PT Short Term Goals - 02/22/19 1136      PT SHORT TERM GOAL #1   Title  be independent in initial HEP    Time  4    Period  Weeks    Status  Achieved    Target Date  12/08/18      PT SHORT TERM GOAL #2   Title  improve strength to perform sit to stand with minimal to no UE support    Time  4    Period  Weeks    Status  Achieved    Target Date  12/08/18      PT SHORT TERM GOAL #3   Title   demonstrate controlled descent with stand to sit transition with minimal UE support    Time  4    Period  Weeks    Status  Achieved    Target Date  12/08/18        PT Long Term Goals - 02/22/19 1137      PT LONG TERM GOAL #1   Title  be independent in advanced HEP    Time  6    Period  Weeks    Status  On-going      PT LONG TERM GOAL #2   Title  perform 5x sit to stand with minimal UE support in < or = to 14 seconds to improve safety and balance    Baseline  12 sec, no UE     Time  6    Period  Weeks    Status  Achieved      PT LONG TERM GOAL #3   Title  improve LE strength to perform stand to sit transition with controlled descent without UE support    Time  6    Period  Weeks    Status  New      PT LONG TERM GOAL #4   Title  demonstrate 4/5 Lt LE strength to improve safety and endurance with gait at home and in the community    Baseline  Lt hip flexion, abduction and extension 4/5    Time  6    Period  Weeks    Status  Achieved      PT LONG TERM GOAL #5   Title  Pt will demonstrate atleast 6 point improvement on BERG balance test, to reflect a decrease in her risk of falling and causing injury to herself at home.     Time  6    Period  Weeks    Status  New            Plan - 03/01/19 1610    Clinical Impression Statement  Pt arrives with improvements in her fatigue from last session. She was  able to complete increased repetitions with the seated LE strengthening exercises, noting minimal Lt hip discomfort during the session compared to previous visits. Pt was able to demonstrate improved tandem balance, up to 20 sec without LOB. She also demonstrated improved dual task control end of session. She was able to complete head turns with noted change in gait speed, however there was no change in direction or shuffling. Will continue with current POC.     Rehab Potential  Good    PT Frequency  2x / week    PT Duration  6 weeks    PT Treatment/Interventions  ADLs/Self  Care Home Management;Gait training;Stair training;Functional mobility training;Therapeutic activities;Therapeutic exercise;Patient/family education;Neuromuscular re-education;Balance training;Passive range of motion;Taping    PT Next Visit Plan  progress note....dual task activity; LE endurance; hip abdutor/extensor strength    PT Home Exercise Plan  Access Code: [email or web address]    Consulted and Agree with Plan of Care  Patient       Patient will benefit from skilled therapeutic intervention in order to improve the following deficits and impairments:  Difficulty walking, Decreased balance, Decreased activity tolerance, Decreased endurance, Decreased strength, Postural dysfunction, Abnormal gait  Visit Diagnosis: Muscle weakness (generalized)  Other abnormalities of gait and mobility  Unsteadiness on feet     Problem List Patient Active Problem List   Diagnosis Date Noted  . Osteoporosis   . Abnormal CXR 01/20/2013  . Preop cardiovascular exam 01/20/2013  . Surgery, elective 01/18/2013  . Fracture of pubic ramus ([place]) 11/21/2011  . Hip pain, acute 11/20/2011  . Fall 11/20/2011  . Hyponatremia 11/20/2011  . Hypokalemia 11/20/2011  . Pelvic fracture ([place]) 11/20/2011  . HTN (hypertension)   . Concussion   . History of breast cancer in female     9:46 AM,03/01/19 Sherol Dade PT, [facility] [address] [place], [place], [address] Phone: [phone]   Fax:  [phone]  Name: DELESA KAWA MRN: [id number] Date of Birth: 1930/03/11

## 2019-03-04 ENCOUNTER — Other Ambulatory Visit: Payer: Self-pay

## 2019-03-04 ENCOUNTER — Ambulatory Visit: Payer: Medicare Other | Admitting: Physical Therapy

## 2019-03-04 ENCOUNTER — Encounter: Payer: Self-pay | Admitting: Physical Therapy

## 2019-03-04 DIAGNOSIS — R2681 Unsteadiness on feet: Secondary | ICD-10-CM | POA: Diagnosis not present

## 2019-03-04 DIAGNOSIS — M6281 Muscle weakness (generalized): Secondary | ICD-10-CM | POA: Diagnosis not present

## 2019-03-04 DIAGNOSIS — R2689 Other abnormalities of gait and mobility: Secondary | ICD-10-CM | POA: Diagnosis not present

## 2019-03-04 NOTE — Therapy (Signed)
[facility] [address], [place], [place], [address] Phone: [phone]   Fax:  [phone]  Physical Therapy Treatment  Patient Details  Name: Gina Mcgee MRN: [id number] Date of Birth: 05/20/1930 Referring Provider (PT): Yaakov Guthrie, MD  Progress Note Reporting Period 02/01/19 to 03/04/19  See note below for Objective Data and Assessment of Progress/Goals.      Encounter Date: 03/04/2019  PT End of Session - 03/04/19 1130    Visit Number  17    Date for PT Re-Evaluation  03/21/19    Authorization Type  Medicare A and B    Authorization Time Period  02/01/19 to 03/21/19    Authorization - Visit Number  10    Authorization - Number of Visits  10    PT Start Time  1100    PT Stop Time  1140    PT Time Calculation (min)  40 min    Activity Tolerance  No increased pain;Patient tolerated treatment well    Behavior During Therapy  [facility] for tasks assessed/performed       Past Medical History:  Diagnosis Date  . Brain concussion 1960  . Cancer [facility])    left-sided breast cancer  . 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 HTN (hypertension)   . Osteoporosis     Past Surgical History:  Procedure Laterality Date  . BOWEL RESECTION  approx. in 2007   polyp and formed stricture  . BREAST LUMPECTOMY  1981   left lumpectomy w/ radiation  . COLONOSCOPY W/ POLYPECTOMY    . FEMUR SURGERY  2011   left femur pinning  . JOINT REPLACEMENT    . ORIF HUMERUS FRACTURE Left 01/25/2013   Procedure: OPEN REDUCTION INTERNAL FIXATION (ORIF) DISTAL HUMERUS FRACTURE REPAIR RECONSTRUCTION AND ULNA NERVE DECOMPRESSION AND ANTERIOR TRANSPOSITION AS NECESSARY;  Surgeon: Roseanne Kaufman, MD;  Location: [place];  Service: Orthopedics;  Laterality: Left;    There were no vitals filed for this visit.  Subjective Assessment - 03/04/19 1103    Subjective  Pt reports that she is having better days latetly. She does feel that  it is helping and she is making up for the time she missed when the clinic was closed.    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  Lt hip fracture/pelvic fracture, spinal contractures- 2011/2012,    Diagnostic tests  none     Patient Stated Goals  improve balance, improve strength and endurance    Currently in Pain?  No/denies         [facility] PT Assessment - 03/04/19 0001      Assessment   Medical Diagnosis  muscular deconditioning, Lt arm contracture    Referring Provider (PT)  Yaakov Guthrie, MD    Onset Date/Surgical Date  --   chronic   Hand Dominance  Right    Prior Therapy  many years ago for Lt hand      Precautions   Precautions  Fall    Precaution Comments  osteoporosis, history of Rt breast cancer, speech deficits when fatigued      Restrictions   Weight Bearing Restrictions  No      Balance Screen   Has the patient fallen in the past 6 months  No    Has the patient had a decrease in activity level because of a fear of falling?   No  Is the patient reluctant to leave their home because of a fear of falling?   Yes      [place]  Private residence    Living Arrangements  Alone    Available Help at Discharge  Personal care attendant   during the day   Type of [place] Access  Level entry    [place] - single point;Tub bench;Grab bars - toilet;Grab bars - tub/shower;Hand held shower head      Prior Function   Level of Independence  Independent    Vocation  Retired    Leisure  unable to resume exercise classes at the gym due to COVID-19       Cognition   Overall Cognitive Status  Within Functional Limits for tasks assessed      Posture/Postural Control   Posture/Postural Control  Postural limitations    Postural Limitations  Flexed trunk;Weight shift left;Increased thoracic kyphosis;Forward head;Rounded  Shoulders      AROM   Overall AROM Comments  --      Strength   Overall Strength  Deficits    Right Hip Flexion  5/5    Right Hip Extension  4/5    Right Hip ABduction  4/5    Left Hip Flexion  4/5    Left Hip Extension  4/5    Left Hip ABduction  4/5    Right Knee Flexion  5/5    Right Knee Extension  5/5    Left Knee Flexion  5/5    Left Knee Extension  5/5    Right Ankle Dorsiflexion  5/5    Left Ankle Dorsiflexion  5/5      Transfers   Five time sit to stand comments   15 sec, poor control with first attemp, no UE       Ambulation/Gait   Ambulation/Gait  Yes    Ambulation/Gait Assistance  5: Supervision    Ambulation Distance (Feet)  100 Feet    Assistive device  Straight cane    Gait Pattern  Step-through pattern;Decreased arm swing - left;Decreased stride length    Gait Comments  instability with change of direction on level surface      Standardized Balance Assessment   Standardized Balance Assessment  Berg Balance Test;Timed Up and Go Test      Berg Balance Test   Sit to Stand  Able to stand without using hands and stabilize independently    Standing Unsupported  Able to stand safely 2 minutes    Sitting with Back Unsupported but Feet Supported on Floor or Stool  Able to sit safely and securely 2 minutes    Stand to Sit  Sits safely with minimal use of hands    Transfers  Able to transfer safely, minor use of hands    Standing Unsupported with Eyes Closed  Able to stand 10 seconds safely    Standing Unsupported with Feet Together  Able to place feet together independently and stand 1 minute safely    From Standing, Reach Forward with Outstretched Arm  Can reach forward >12 cm safely (5")    From Standing Position, Pick up Object from Floor  Able to pick up shoe safely and easily    From Standing Position, Turn to Look Behind Over each Shoulder  Looks behind one side only/other side shows less weight shift   turns more to  the Rt than Lt    Turn 360 Degrees  Able  to turn 360 degrees safely but slowly   7 sec to the Lt, 8 sec to the Rt   Standing Unsupported, Alternately Place Feet on Step/Stool  Able to stand independently and safely and complete 8 steps in 20 seconds    Standing Unsupported, One Foot in Front  Able to plae foot ahead of the other independently and hold 30 seconds   Rt: 30; Lt: needs UE support get position   Standing on One Leg  Able to lift leg independently and hold equal to or more than 3 seconds   Rt 4 sec, Lt 3 sec   Total Score  49      Timed Up and Go Test   TUG Comments  17 sec with SPC; 18 sec no SPC                   [facility] Adult PT Treatment/Exercise - 03/04/19 0001      Knee/Hip Exercises: Aerobic   Nustep  L2 x5 min, PT present to discuss progress       Knee/Hip Exercises: Standing   Knee Flexion  2 sets;Both;10 reps    Knee Flexion Limitations  2# ankle weights      Knee/Hip Exercises: Seated   Long Arc Quad  Left;Right;Strengthening;2 sets;15 reps    Long Arc Quad Weight  --   Rt 5lb, Lt 4lb   Marching  Strengthening;Left;Right;2 sets;10 reps    Marching Limitations  10 sec hold last rep    Marching Weights  --   Rt 5lb, Lt 4lb              PT Short Term Goals - 03/04/19 1122      PT SHORT TERM GOAL #1   Title  be independent in initial HEP    Time  4    Period  Weeks    Status  Achieved    Target Date  12/08/18      PT SHORT TERM GOAL #2   Title  improve strength to perform sit to stand with minimal to no UE support    Time  4    Period  Weeks    Status  Achieved    Target Date  12/08/18      PT SHORT TERM GOAL #3   Title  demonstrate controlled descent with stand to sit transition with minimal UE support    Time  4    Period  Weeks    Status  Achieved    Target Date  12/08/18        PT Long Term Goals - 03/04/19 1123      PT LONG TERM GOAL #1   Title  be independent in advanced HEP    Time  6    Period  Weeks    Status  On-going      PT LONG TERM GOAL #2    Title  perform 5x sit to stand with minimal UE support in < or = to 14 seconds to improve safety and balance    Baseline  12 sec, no UE     Time  6    Period  Weeks    Status  Achieved      PT LONG TERM GOAL #3   Title  improve LE strength to perform stand to sit transition with controlled descent without UE support    Time  6  Period  Weeks    Status  Achieved      PT LONG TERM GOAL #4   Title  demonstrate 4/5 Lt LE strength to improve safety and endurance with gait at home and in the community    Baseline  Lt hip flexion, abduction and extension 4/5    Time  6    Period  Weeks    Status  Achieved      PT LONG TERM GOAL #5   Title  Pt will demonstrate atleast 6 point improvement on BERG balance test, to reflect a decrease in her risk of falling and causing injury to herself at home.     Baseline  increased 12 points to 49    Time  6    Period  Weeks    Status  On-going      Additional Long Term Goals   Additional Long Term Goals  Yes      PT LONG TERM GOAL #6   Title  Pt will complete the TUG in less than 14 sec with LRAD to reflect improvements in balance during ambulation at home and in the community.    Time  6    Period  Weeks    Status  New            Plan - 03/04/19 1300    Clinical Impression Statement  Pt continues to make steady progress towards her goals. She is consistently working on her HEP and reports improvements in her mobility throughout the day. Pt's BERG balance test has improved significantly up 12 points from her most recent re-evaluation. Pt is currently unable to complete her gym routine due to COVID-19 closures, but plans to return once able. She would continue to benefit from skilled PT to address her remaining limitations in LE strength and proprioception in order to decrease her risk of falling and decline in function until she is able to return to her gym.    Rehab Potential  Good    PT Frequency  2x / week    PT Duration  6 weeks    PT  Treatment/Interventions  ADLs/Self Care Home Management;Gait training;Stair training;Functional mobility training;Therapeutic activities;Therapeutic exercise;Patient/family education;Neuromuscular re-education;Balance training;Passive range of motion;Taping    PT Next Visit Plan  dual task activity; tandem balance; LE endurance; hip abdutor/extensor strength    PT Home Exercise Plan  Access Code: [email or web address]    Consulted and Agree with Plan of Care  Patient       Patient will benefit from skilled therapeutic intervention in order to improve the following deficits and impairments:  Difficulty walking, Decreased balance, Decreased activity tolerance, Decreased endurance, Decreased strength, Postural dysfunction, Abnormal gait  Visit Diagnosis: Other abnormalities of gait and mobility - Plan:   Muscle weakness (generalized) - Plan:   Unsteadiness on feet - Plan:      Problem List Patient Active Problem List   Diagnosis Date Noted  . Osteoporosis   . Abnormal CXR 01/20/2013  . Preop cardiovascular exam 01/20/2013  . Surgery, elective 01/18/2013  . Fracture of pubic ramus ([place]) 11/21/2011  . Hip pain, acute 11/20/2011  . Fall 11/20/2011  . Hyponatremia 11/20/2011  . Hypokalemia 11/20/2011  . Pelvic fracture ([place]) 11/20/2011  . HTN (hypertension)   . Concussion   . History of breast cancer in female     1:13 PM,03/04/19 Sherol Dade PT, [facility] [address]  [place], [place], [place], [address] Phone: [phone]   Fax:  [phone]  Name: Gina Mcgee MRN: [id number] Date of Birth: Mar 25, 1930

## 2019-03-08 ENCOUNTER — Other Ambulatory Visit: Payer: Self-pay

## 2019-03-08 ENCOUNTER — Ambulatory Visit: Payer: Medicare Other | Admitting: Physical Therapy

## 2019-03-08 ENCOUNTER — Encounter

## 2019-03-08 ENCOUNTER — Encounter: Payer: Self-pay | Admitting: Physical Therapy

## 2019-03-08 DIAGNOSIS — R2681 Unsteadiness on feet: Secondary | ICD-10-CM | POA: Diagnosis not present

## 2019-03-08 DIAGNOSIS — M6281 Muscle weakness (generalized): Secondary | ICD-10-CM

## 2019-03-08 DIAGNOSIS — R2689 Other abnormalities of gait and mobility: Secondary | ICD-10-CM | POA: Diagnosis not present

## 2019-03-08 NOTE — Patient Instructions (Signed)
Access Code: [email or web address]  URL: [email or web address]  Date: 03/08/2019  Prepared by: Sherol Dade   Exercises  Supine Active Straight Leg Raise - 15 reps - 1 sets - 1x daily - 7x weekly  Standing Hip Abduction with Counter Support - 10 reps - 2 sets - 1x daily - 7x weekly  Standing Tandem Balance with Counter Support - 20 hold - 3x daily - 7x weekly  Side Stepping with Resistance at Thighs and Ankles - 10 reps - 2 sets - 1x daily - 7x weekly  Prone Hip Extension on Table - 15-20 reps - 2x daily - 7x weekly    [facility] [address] [place], [place] [address] Phone # [phone] Fax [phone]

## 2019-03-08 NOTE — Therapy (Signed)
[facility] [address], [place], [place], [address] Phone: [phone]   Fax:  [phone]  Physical Therapy Treatment  Patient Details  Name: Gina Mcgee MRN: [id number] Date of Birth: Feb 29, 1930 Referring Provider (PT): Yaakov Guthrie, MD   Encounter Date: 03/08/2019  PT End of Session - 03/08/19 0913    Visit Number  18    Date for PT Re-Evaluation  03/21/19    Authorization Type  Medicare A and B    Authorization Time Period  02/01/19 to 03/21/19    Authorization - Visit Number  11    Authorization - Number of Visits  20    PT Start Time  0905    PT Stop Time  0945    PT Time Calculation (min)  40 min    Activity Tolerance  No increased pain;Patient tolerated treatment well    Behavior During Therapy  [facility] for tasks assessed/performed       Past Medical History:  Diagnosis Date  . Brain concussion 1960  . Cancer [facility])    left-sided breast cancer  . 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 HTN (hypertension)   . Osteoporosis     Past Surgical History:  Procedure Laterality Date  . BOWEL RESECTION  approx. in 2007   polyp and formed stricture  . BREAST LUMPECTOMY  1981   left lumpectomy w/ radiation  . COLONOSCOPY W/ POLYPECTOMY    . FEMUR SURGERY  2011   left femur pinning  . JOINT REPLACEMENT    . ORIF HUMERUS FRACTURE Left 01/25/2013   Procedure: OPEN REDUCTION INTERNAL FIXATION (ORIF) DISTAL HUMERUS FRACTURE REPAIR RECONSTRUCTION AND ULNA NERVE DECOMPRESSION AND ANTERIOR TRANSPOSITION AS NECESSARY;  Surgeon: Roseanne Kaufman, MD;  Location: [place];  Service: Orthopedics;  Laterality: Left;    There were no vitals filed for this visit.  Subjective Assessment - 03/08/19 0908    Subjective  Pt states things are going well.    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  Lt hip fracture/pelvic fracture, spinal  contractures- 2011/2012,    Diagnostic tests  none     Patient Stated Goals  improve balance, improve strength and endurance    Currently in Pain?  No/denies                       [facility] Adult PT Treatment/Exercise - 03/08/19 0001      Knee/Hip Exercises: Standing   Heel Raises  Both;1 set;20 reps    Heel Raises Limitations  pad     Hip Abduction  Stengthening;Right;Left;2 sets;10 reps    Abduction Limitations  green TB around knees     Hip Extension  AROM;Both;1 set;15 reps;Knee straight    Extension Limitations  resting on forearms    Other Standing Knee Exercises  sidestep with green TB around knees 2x12 reps     Other Standing Knee Exercises  toe raises without UE support x20 reps       Knee/Hip Exercises: Seated   Long Arc Quad  Left;Right;Strengthening;2 sets;15 reps    Long Arc Quad Weight  5 lbs.    Marching  Strengthening;Both;2 sets    Marching Limitations  10 sec hold last rep, x20 reps first set, x10 reps 2nd set     Marching Weights  --   Rt 5lb, Lt 4lb  Balance Exercises - 03/08/19 0918      Balance Exercises: Standing   Marching Limitations  alternating LE 3x30 sec (reps: 35/38/40)        PT Education - 03/08/19 0923    Education Details  technique with therex    Person(s) Educated  Patient    Methods  Explanation;Handout    Comprehension  Verbalized understanding       PT Short Term Goals - 03/04/19 1122      PT SHORT TERM GOAL #1   Title  be independent in initial HEP    Time  4    Period  Weeks    Status  Achieved    Target Date  12/08/18      PT SHORT TERM GOAL #2   Title  improve strength to perform sit to stand with minimal to no UE support    Time  4    Period  Weeks    Status  Achieved    Target Date  12/08/18      PT SHORT TERM GOAL #3   Title  demonstrate controlled descent with stand to sit transition with minimal UE support    Time  4    Period  Weeks    Status  Achieved    Target Date  12/08/18         PT Long Term Goals - 03/04/19 1123      PT LONG TERM GOAL #1   Title  be independent in advanced HEP    Time  6    Period  Weeks    Status  On-going      PT LONG TERM GOAL #2   Title  perform 5x sit to stand with minimal UE support in < or = to 14 seconds to improve safety and balance    Baseline  12 sec, no UE     Time  6    Period  Weeks    Status  Achieved      PT LONG TERM GOAL #3   Title  improve LE strength to perform stand to sit transition with controlled descent without UE support    Time  6    Period  Weeks    Status  Achieved      PT LONG TERM GOAL #4   Title  demonstrate 4/5 Lt LE strength to improve safety and endurance with gait at home and in the community    Baseline  Lt hip flexion, abduction and extension 4/5    Time  6    Period  Weeks    Status  Achieved      PT LONG TERM GOAL #5   Title  Pt will demonstrate atleast 6 point improvement on BERG balance test, to reflect a decrease in her risk of falling and causing injury to herself at home.     Baseline  increased 12 points to 49    Time  6    Period  Weeks    Status  On-going      Additional Long Term Goals   Additional Long Term Goals  Yes      PT LONG TERM GOAL #6   Title  Pt will complete the TUG in less than 14 sec with LRAD to reflect improvements in balance during ambulation at home and in the community.    Time  6    Period  Weeks    Status  New  Plan - 03/08/19 0951    Clinical Impression Statement  Today's session focused on LE strength progressions, addressing areas of limitations noted at last week's reassessment. Pt was able to progress weight with seated knee extension on the Lt, noting minimal difficulty with this. Pt was able to perform standing heel raises on a foam pad without LOB or the need for UE support, which is a significant improvement from previous attempts at this. Pt's HEP was updated to reflect progress noted in today's session and she demonstrated  good understanding. Will continue with current POC.    Rehab Potential  Good    PT Frequency  2x / week    PT Duration  6 weeks    PT Treatment/Interventions  ADLs/Self Care Home Management;Gait training;Stair training;Functional mobility training;Therapeutic activities;Therapeutic exercise;Patient/family education;Neuromuscular re-education;Balance training;Passive range of motion;Taping    PT Next Visit Plan  foam balance and dual task/head turns, etc; LE endurance; hip abdutor/extensor strength    PT Home Exercise Plan  Access Code: [email or web address]    Consulted and Agree with Plan of Care  Patient       Patient will benefit from skilled therapeutic intervention in order to improve the following deficits and impairments:  Difficulty walking, Decreased balance, Decreased activity tolerance, Decreased endurance, Decreased strength, Postural dysfunction, Abnormal gait  Visit Diagnosis: 1. Other abnormalities of gait and mobility   2. Muscle weakness (generalized)   3. Unsteadiness on feet        Problem List Patient Active Problem List   Diagnosis Date Noted  . Osteoporosis   . Abnormal CXR 01/20/2013  . Preop cardiovascular exam 01/20/2013  . Surgery, elective 01/18/2013  . Fracture of pubic ramus ([place]) 11/21/2011  . Hip pain, acute 11/20/2011  . Fall 11/20/2011  . Hyponatremia 11/20/2011  . Hypokalemia 11/20/2011  . Pelvic fracture ([place]) 11/20/2011  . HTN (hypertension)   . Concussion   . History of breast cancer in female     9:57 AM,03/08/19 Sherol Dade PT, [facility] [address], [place], [place], [address] Phone: [phone]   Fax:  [phone]  Name: Gina Mcgee MRN: [id number] Date of Birth: 02/16/1930

## 2019-03-11 ENCOUNTER — Other Ambulatory Visit: Payer: Self-pay

## 2019-03-11 ENCOUNTER — Ambulatory Visit: Payer: Medicare Other | Admitting: Physical Therapy

## 2019-03-11 ENCOUNTER — Encounter: Payer: Self-pay | Admitting: Physical Therapy

## 2019-03-11 DIAGNOSIS — R2689 Other abnormalities of gait and mobility: Secondary | ICD-10-CM

## 2019-03-11 DIAGNOSIS — R2681 Unsteadiness on feet: Secondary | ICD-10-CM | POA: Diagnosis not present

## 2019-03-11 DIAGNOSIS — M6281 Muscle weakness (generalized): Secondary | ICD-10-CM | POA: Diagnosis not present

## 2019-03-11 NOTE — Therapy (Signed)
[facility] [address] [place], [place], [address] Phone: [phone]   Fax:  [phone]  Physical Therapy Treatment  Patient Details  Name: Gina Mcgee MRN: [id number] Date of Birth: 07-10-1930 Referring Provider (PT): Yaakov Guthrie, MD   Encounter Date: 03/11/2019  PT End of Session - 03/11/19 1137    Visit Number  19    Date for PT Re-Evaluation  03/21/19    Authorization Type  Medicare A and B    Authorization Time Period  02/01/19 to 03/21/19    Authorization - Visit Number  12    Authorization - Number of Visits  20    PT Start Time  1829    PT Stop Time  1140    PT Time Calculation (min)  38 min    Activity Tolerance  No increased pain;Patient tolerated treatment well    Behavior During Therapy  [facility] for tasks assessed/performed       Past Medical History:  Diagnosis Date  . Brain concussion 1960  . Cancer [facility])    left-sided breast cancer  . 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 HTN (hypertension)   . Osteoporosis     Past Surgical History:  Procedure Laterality Date  . BOWEL RESECTION  approx. in 2007   polyp and formed stricture  . BREAST LUMPECTOMY  1981   left lumpectomy w/ radiation  . COLONOSCOPY W/ POLYPECTOMY    . FEMUR SURGERY  2011   left femur pinning  . JOINT REPLACEMENT    . ORIF HUMERUS FRACTURE Left 01/25/2013   Procedure: OPEN REDUCTION INTERNAL FIXATION (ORIF) DISTAL HUMERUS FRACTURE REPAIR RECONSTRUCTION AND ULNA NERVE DECOMPRESSION AND ANTERIOR TRANSPOSITION AS NECESSARY;  Surgeon: Roseanne Kaufman, MD;  Location: [place];  Service: Orthopedics;  Laterality: Left;    There were no vitals filed for this visit.  Subjective Assessment - 03/11/19 1105    Subjective  Pt states that she did all of her new exercises at home. No issues to report currently.    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  Lt hip fracture/pelvic fracture, spinal contractures- 2011/2012,    Diagnostic tests  none     Patient Stated Goals  improve balance, improve strength and endurance    Currently in Pain?  No/denies                       [facility] Adult PT Treatment/Exercise - 03/11/19 0001      Knee/Hip Exercises: Standing   Heel Raises  Both;1 set;20 reps    Heel Raises Limitations  no UE support       Knee/Hip Exercises: Seated   Long Arc Quad  Left;Right;Strengthening;2 sets;15 reps    Long Arc Quad Weight  5 lbs.    Marching  Strengthening;Both;2 sets    Marching Limitations  10 sec hold last rep, x20 reps first set, x10 reps 2nd set     Marching Weights  5 lbs.          Balance Exercises - 03/11/19 1116      Balance Exercises: Standing   Standing Eyes Opened  Narrow base of support (BOS);Foam/compliant surface;Head turns;2 reps   Lt/Rt and up/down x10 reps each set    Tandem Stance  Eyes open;Foam/compliant surface;2 reps;15 secs    Other Standing Exercises  NBOS and tandem stance on foam  with alternating finger to nose x10 reps each          PT Short Term Goals - 03/04/19 1122      PT SHORT TERM GOAL #1   Title  be independent in initial HEP    Time  4    Period  Weeks    Status  Achieved    Target Date  12/08/18      PT SHORT TERM GOAL #2   Title  improve strength to perform sit to stand with minimal to no UE support    Time  4    Period  Weeks    Status  Achieved    Target Date  12/08/18      PT SHORT TERM GOAL #3   Title  demonstrate controlled descent with stand to sit transition with minimal UE support    Time  4    Period  Weeks    Status  Achieved    Target Date  12/08/18        PT Long Term Goals - 03/04/19 1123      PT LONG TERM GOAL #1   Title  be independent in advanced HEP    Time  6    Period  Weeks    Status  On-going      PT LONG TERM GOAL #2   Title  perform 5x sit to stand with minimal UE support in < or = to 14  seconds to improve safety and balance    Baseline  12 sec, no UE     Time  6    Period  Weeks    Status  Achieved      PT LONG TERM GOAL #3   Title  improve LE strength to perform stand to sit transition with controlled descent without UE support    Time  6    Period  Weeks    Status  Achieved      PT LONG TERM GOAL #4   Title  demonstrate 4/5 Lt LE strength to improve safety and endurance with gait at home and in the community    Baseline  Lt hip flexion, abduction and extension 4/5    Time  6    Period  Weeks    Status  Achieved      PT LONG TERM GOAL #5   Title  Pt will demonstrate atleast 6 point improvement on BERG balance test, to reflect a decrease in her risk of falling and causing injury to herself at home.     Baseline  increased 12 points to 49    Time  6    Period  Weeks    Status  On-going      Additional Long Term Goals   Additional Long Term Goals  Yes      PT LONG TERM GOAL #6   Title  Pt will complete the TUG in less than 14 sec with LRAD to reflect improvements in balance during ambulation at home and in the community.    Time  6    Period  Weeks    Status  New            Plan - 03/11/19 1138    Clinical Impression Statement  Pt continues to demonstrate good motivation during her sessions. She was able to progress seated Lt hip flexion and knee extension to 5lb with minimal asymmetries noted between Lt and Rt LEs. Introduced static standing balance with head turns,  and pt had more difficulty with Lt/Rt. Pt took intermittent rest breaks, but reported no significant LE fatigue end of today's session. Will continue with current POC.    Rehab Potential  Good    PT Frequency  2x / week    PT Duration  6 weeks    PT Treatment/Interventions  ADLs/Self Care Home Management;Gait training;Stair training;Functional mobility training;Therapeutic activities;Therapeutic exercise;Patient/family education;Neuromuscular re-education;Balance training;Passive range of  motion;Taping    PT Next Visit Plan  foam balance and dual task/head turns, etc; LE endurance; hip abdutor/extensor strength    PT Home Exercise Plan  Access Code: [email or web address]    Consulted and Agree with Plan of Care  Patient       Patient will benefit from skilled therapeutic intervention in order to improve the following deficits and impairments:  Difficulty walking, Decreased balance, Decreased activity tolerance, Decreased endurance, Decreased strength, Postural dysfunction, Abnormal gait  Visit Diagnosis: 1. Other abnormalities of gait and mobility   2. Muscle weakness (generalized)   3. Unsteadiness on feet        Problem List Patient Active Problem List   Diagnosis Date Noted  . Osteoporosis   . Abnormal CXR 01/20/2013  . Preop cardiovascular exam 01/20/2013  . Surgery, elective 01/18/2013  . Fracture of pubic ramus ([place]) 11/21/2011  . Hip pain, acute 11/20/2011  . Fall 11/20/2011  . Hyponatremia 11/20/2011  . Hypokalemia 11/20/2011  . Pelvic fracture ([place]) 11/20/2011  . HTN (hypertension)   . Concussion   . History of breast cancer in female     12:52 PM,03/11/19 Sherol Dade PT, DPT [facility] [address] [place], [place], [address] Phone: [phone]   Fax:  [phone]  Name: Gina Mcgee MRN: [id number] Date of Birth: [birth year]

## 2019-03-15 ENCOUNTER — Other Ambulatory Visit: Payer: Self-pay

## 2019-03-15 ENCOUNTER — Ambulatory Visit: Payer: Medicare Other | Admitting: Physical Therapy

## 2019-03-15 DIAGNOSIS — M6281 Muscle weakness (generalized): Secondary | ICD-10-CM

## 2019-03-15 DIAGNOSIS — R2689 Other abnormalities of gait and mobility: Secondary | ICD-10-CM

## 2019-03-15 DIAGNOSIS — R2681 Unsteadiness on feet: Secondary | ICD-10-CM

## 2019-03-15 NOTE — Therapy (Signed)
[facility] [address] [place], [place], [address] Phone: [phone]   Fax:  [phone]  Physical Therapy Treatment  Patient Details  Name: Gina Mcgee MRN: [id number] Date of Birth: 11/12/1929 Referring Provider (PT): Yaakov Guthrie, MD   Encounter Date: 03/15/2019  PT End of Session - 03/15/19 1037    Visit Number  20    Date for PT Re-Evaluation  03/21/19    Authorization Type  Medicare A and B    Authorization Time Period  02/01/19 to 03/21/19    Authorization - Visit Number  13    Authorization - Number of Visits  20    PT Start Time  1000    PT Stop Time  1829    PT Time Calculation (min)  40 min    Activity Tolerance  No increased pain;Patient tolerated treatment well    Behavior During Therapy  [facility] for tasks assessed/performed       Past Medical History:  Diagnosis Date  . Brain concussion 1960  . Cancer [facility])    left-sided breast cancer  . 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 HTN (hypertension)   . Osteoporosis     Past Surgical History:  Procedure Laterality Date  . BOWEL RESECTION  approx. in 2007   polyp and formed stricture  . BREAST LUMPECTOMY  1981   left lumpectomy w/ radiation  . COLONOSCOPY W/ POLYPECTOMY    . FEMUR SURGERY  2011   left femur pinning  . JOINT REPLACEMENT    . ORIF HUMERUS FRACTURE Left 01/25/2013   Procedure: OPEN REDUCTION INTERNAL FIXATION (ORIF) DISTAL HUMERUS FRACTURE REPAIR RECONSTRUCTION AND ULNA NERVE DECOMPRESSION AND ANTERIOR TRANSPOSITION AS NECESSARY;  Surgeon: Roseanne Kaufman, MD;  Location: [place];  Service: Orthopedics;  Laterality: Left;    There were no vitals filed for this visit.  Subjective Assessment - 03/15/19 1001    Subjective  Pt states things are going well. No complaints at this time.    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  Lt hip  fracture/pelvic fracture, spinal contractures- 2011/2012,    Diagnostic tests  none     Patient Stated Goals  improve balance, improve strength and endurance    Currently in Pain?  No/denies                       [facility] Adult PT Treatment/Exercise - 03/15/19 0001      Knee/Hip Exercises: Aerobic   Nustep  L2 x3 min, L3 x3 min PT present to discuss progress       Knee/Hip Exercises: Standing   Heel Raises  Both;1 set;20 reps    Heel Raises Limitations  no UE support     Knee Flexion  2 sets;Both;10 reps    Knee Flexion Limitations  2    Hip ADduction  Strengthening;Both;2 sets;10 reps    Hip ADduction Limitations  towel slide       Knee/Hip Exercises: Seated   Long Arc Quad  Left;Right;Strengthening;2 sets;15 reps    Long Arc Quad Weight  5 lbs.    Long [employer] Limitations  last set with 10 sec hold last rep     Marching  Strengthening;Both;2 sets;15 reps    Marching Limitations  10 sec hold rep x2 sets          Balance Exercises - 03/15/19  1016      Balance Exercises: Standing   Standing Eyes Opened  Narrow base of support (BOS);Head turns;Foam/compliant surface    Tandem Stance  Eyes open;Foam/compliant surface   head turns x10 reps    Other Standing Exercises  atlernating 6" step tap x10 reps         PT Education - 03/15/19 1037    Education Details  technique with therex    Person(s) Educated  Patient    Methods  Explanation;Verbal cues    Comprehension  Returned demonstration;Verbalized understanding       PT Short Term Goals - 03/04/19 1122      PT SHORT TERM GOAL #1   Title  be independent in initial HEP    Time  4    Period  Weeks    Status  Achieved    Target Date  12/08/18      PT SHORT TERM GOAL #2   Title  improve strength to perform sit to stand with minimal to no UE support    Time  4    Period  Weeks    Status  Achieved    Target Date  12/08/18      PT SHORT TERM GOAL #3   Title  demonstrate controlled descent with stand  to sit transition with minimal UE support    Time  4    Period  Weeks    Status  Achieved    Target Date  12/08/18        PT Long Term Goals - 03/04/19 1123      PT LONG TERM GOAL #1   Title  be independent in advanced HEP    Time  6    Period  Weeks    Status  On-going      PT LONG TERM GOAL #2   Title  perform 5x sit to stand with minimal UE support in < or = to 14 seconds to improve safety and balance    Baseline  12 sec, no UE     Time  6    Period  Weeks    Status  Achieved      PT LONG TERM GOAL #3   Title  improve LE strength to perform stand to sit transition with controlled descent without UE support    Time  6    Period  Weeks    Status  Achieved      PT LONG TERM GOAL #4   Title  demonstrate 4/5 Lt LE strength to improve safety and endurance with gait at home and in the community    Baseline  Lt hip flexion, abduction and extension 4/5    Time  6    Period  Weeks    Status  Achieved      PT LONG TERM GOAL #5   Title  Pt will demonstrate atleast 6 point improvement on BERG balance test, to reflect a decrease in her risk of falling and causing injury to herself at home.     Baseline  increased 12 points to 49    Time  6    Period  Weeks    Status  On-going      Additional Long Term Goals   Additional Long Term Goals  Yes      PT LONG TERM GOAL #6   Title  Pt will complete the TUG in less than 14 sec with LRAD to reflect improvements in balance during ambulation at home  and in the community.    Time  6    Period  Weeks    Status  New            Plan - 03/15/19 1038    Clinical Impression Statement  Pt's LE strength and proprioception continue to improve. She is no longer having to assist her Lt leg into the car. She was able to complete 10 sec isometric hold with her seated knee extension and hip flexion, with moderate difficulty. She is now consistently using 5lb weight with her Lt LE, which is symmetric to the Rt LE. Pt would continue to benefit  from skilled PT, and we will complete formal re-evaluation next visit to determine how to move forward.    Rehab Potential  Good    PT Frequency  2x / week    PT Duration  6 weeks    PT Treatment/Interventions  ADLs/Self Care Home Management;Gait training;Stair training;Functional mobility training;Therapeutic activities;Therapeutic exercise;Patient/family education;Neuromuscular re-education;Balance training;Passive range of motion;Taping    PT Next Visit Plan  re-eval; extend POC 1x/week atleast    PT Home Exercise Plan  Access Code: [email or web address]    Consulted and Agree with Plan of Care  Patient       Patient will benefit from skilled therapeutic intervention in order to improve the following deficits and impairments:  Difficulty walking, Decreased balance, Decreased activity tolerance, Decreased endurance, Decreased strength, Postural dysfunction, Abnormal gait  Visit Diagnosis: 1. Other abnormalities of gait and mobility   2. Muscle weakness (generalized)   3. Unsteadiness on feet        Problem List Patient Active Problem List   Diagnosis Date Noted  . Osteoporosis   . Abnormal CXR 01/20/2013  . Preop cardiovascular exam 01/20/2013  . Surgery, elective 01/18/2013  . Fracture of pubic ramus ([place]) 11/21/2011  . Hip pain, acute 11/20/2011  . Fall 11/20/2011  . Hyponatremia 11/20/2011  . Hypokalemia 11/20/2011  . Pelvic fracture ([place]) 11/20/2011  . HTN (hypertension)   . Concussion   . History of breast cancer in female     10:47 AM,03/15/19 Sherol Dade PT, [facility] [place] [address] [place], [place], [address] Phone: [phone]   Fax:  [phone]  Name: Gina Mcgee MRN: [id number] Date of Birth: [birth year]

## 2019-03-18 ENCOUNTER — Other Ambulatory Visit: Payer: Self-pay

## 2019-03-18 ENCOUNTER — Encounter: Payer: Self-pay | Admitting: Physical Therapy

## 2019-03-18 ENCOUNTER — Ambulatory Visit: Payer: Medicare Other | Admitting: Physical Therapy

## 2019-03-18 DIAGNOSIS — R2681 Unsteadiness on feet: Secondary | ICD-10-CM

## 2019-03-18 DIAGNOSIS — R2689 Other abnormalities of gait and mobility: Secondary | ICD-10-CM | POA: Diagnosis not present

## 2019-03-18 DIAGNOSIS — M6281 Muscle weakness (generalized): Secondary | ICD-10-CM

## 2019-03-18 NOTE — Therapy (Signed)
[facility] [address] [place], [place], [address] Phone: [phone]   Fax:  [phone]  Physical Therapy Treatment/Re-evaluation  Patient Details  Name: Gina Mcgee MRN: [id number] Date of Birth: [birth year] Referring Provider (PT): Yaakov Guthrie, MD  Progress Note Reporting Period 02/03/19 to 03/18/19  See note below for Objective Data and Assessment of Progress/Goals.       Encounter Date: 03/18/2019  PT End of Session - 03/18/19 1030    Visit Number  21    Date for PT Re-Evaluation  05/03/19    Authorization Type  Medicare A and B    Authorization Time Period  03/22/19 to 05/03/19    Authorization - Visit Number  1    Authorization - Number of Visits  10    PT Start Time  1000    PT Stop Time  5009    PT Time Calculation (min)  40 min    Activity Tolerance  No increased pain;Patient tolerated treatment well    Behavior During Therapy  [facility] for tasks assessed/performed       Past Medical History:  Diagnosis Date  . Brain concussion 1960  . Cancer [facility])    left-sided breast cancer  . 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 HTN (hypertension)   . Osteoporosis     Past Surgical History:  Procedure Laterality Date  . BOWEL RESECTION  approx. in 2007   polyp and formed stricture  . BREAST LUMPECTOMY  1981   left lumpectomy w/ radiation  . COLONOSCOPY W/ POLYPECTOMY    . FEMUR SURGERY  2011   left femur pinning  . JOINT REPLACEMENT    . ORIF HUMERUS FRACTURE Left 01/25/2013   Procedure: OPEN REDUCTION INTERNAL FIXATION (ORIF) DISTAL HUMERUS FRACTURE REPAIR RECONSTRUCTION AND ULNA NERVE DECOMPRESSION AND ANTERIOR TRANSPOSITION AS NECESSARY;  Surgeon: Roseanne Kaufman, MD;  Location: [place];  Service: Orthopedics;  Laterality: Left;    There were no vitals filed for this visit.  Subjective Assessment - 03/18/19 0958    Subjective  Pt states that things are going well. She feels that she  is improving and not having to help her Lt leg in/out of the car. Her HEP is still going well.    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  Lt hip fracture/pelvic fracture, spinal contractures- 2011/2012,    Diagnostic tests  none     Patient Stated Goals  improve balance, improve strength and endurance    Currently in Pain?  No/denies         [facility] PT Assessment - 03/18/19 0001      Assessment   Medical Diagnosis  muscular deconditioning, Lt arm contracture    Referring Provider (PT)  Yaakov Guthrie, MD    Onset Date/Surgical Date  --   chronic   Hand Dominance  Right    Prior Therapy  many years ago for Lt hand      Precautions   Precautions  Fall    Precaution Comments  osteoporosis, history of Rt breast cancer, speech deficits when fatigued      Restrictions   Weight Bearing Restrictions  No      Balance Screen   Has the patient fallen in the past 6 months  No    Has the patient had a decrease in activity level because of a fear of falling?   No  Is the patient reluctant to leave their home because of a fear of falling?   Yes      [place]  Private residence    Living Arrangements  Alone    Available Help at Discharge  Personal care attendant   during the day   Type of [place] Access  Level entry    [place] - single point;Tub bench;Grab bars - toilet;Grab bars - tub/shower;Hand held shower head      Prior Function   Level of Independence  Independent    Vocation  Retired    Leisure  unable to resume exercise classes at the gym due to COVID-19       Cognition   Overall Cognitive Status  Within Functional Limits for tasks assessed      Posture/Postural Control   Posture/Postural Control  Postural limitations    Postural Limitations  Flexed trunk;Weight shift left;Increased thoracic kyphosis;Forward  head;Rounded Shoulders      Strength   Overall Strength  Deficits    Right Hip Flexion  5/5    Right Hip Extension  4/5    Right Hip ABduction  4/5    Left Hip Flexion  4+/5    Left Hip Extension  4/5    Left Hip ABduction  4/5    Right Knee Flexion  5/5    Right Knee Extension  5/5    Left Knee Flexion  5/5    Left Knee Extension  5/5    Right Ankle Dorsiflexion  5/5    Left Ankle Dorsiflexion  5/5      Transfers   Five time sit to stand comments   11 sec, no UE support       Ambulation/Gait   Ambulation/Gait  Yes    Ambulation/Gait Assistance  5: Supervision    Ambulation Distance (Feet)  --    Assistive device  Straight cane    Gait Pattern  Step-through pattern;Decreased arm swing - left;Decreased stride length    Gait Comments  --      6 minute walk test results    Endurance additional comments  800 ft with SPC, therapist SBA       Standardized Balance Assessment   Standardized Balance Assessment  Berg Balance Test;Timed Up and Go Test      Berg Balance Test   Sit to Stand  Able to stand without using hands and stabilize independently    Standing Unsupported  Able to stand safely 2 minutes    Sitting with Back Unsupported but Feet Supported on Floor or Stool  Able to sit safely and securely 2 minutes    Stand to Sit  Sits safely with minimal use of hands    Transfers  Able to transfer safely, minor use of hands    Standing Unsupported with Eyes Closed  Able to stand 10 seconds safely    Standing Unsupported with Feet Together  Able to place feet together independently and stand 1 minute safely    From Standing, Reach Forward with Outstretched Arm  Can reach forward >12 cm safely (5")    From Standing Position, Pick up Object from Floor  Able to pick up shoe safely and easily    From Standing Position, Turn to Look Behind Over each Shoulder  Looks behind one side only/other side shows less weight shift   turns more to the  Rt than Lt    Turn 360 Degrees  Able to turn  360 degrees safely but slowly   4 sec on Rt, 5 sec on Lt with near LOB   Standing Unsupported, Alternately Place Feet on Step/Stool  Able to stand independently and safely and complete 8 steps in 20 seconds    Standing Unsupported, One Foot in Front  Able to plae foot ahead of the other independently and hold 30 seconds   Rt: 30; Lt: needs UE support get position   Standing on One Leg  Able to lift leg independently and hold equal to or more than 3 seconds   Rt 5 sec, Lt 3 sec   Total Score  49      Timed Up and Go Test   TUG Comments  11 sec with SPC; 15 sec no SPC                           PT Education - 03/18/19 1106    Education Details  PT POC updates    Person(s) Educated  Patient    Methods  Explanation    Comprehension  Verbalized understanding       PT Short Term Goals - 03/18/19 1012      PT SHORT TERM GOAL #1   Title  be independent in initial HEP    Time  4    Period  Weeks    Status  Achieved    Target Date  12/08/18      PT SHORT TERM GOAL #2   Title  improve strength to perform sit to stand with minimal to no UE support    Time  4    Period  Weeks    Status  Achieved    Target Date  12/08/18      PT SHORT TERM GOAL #3   Title  demonstrate controlled descent with stand to sit transition with minimal UE support    Time  4    Period  Weeks    Status  Achieved    Target Date  12/08/18        PT Long Term Goals - 03/18/19 1012      PT LONG TERM GOAL #1   Title  be independent in advanced HEP    Time  6    Period  Weeks    Status  On-going      PT LONG TERM GOAL #2   Title  perform 5x sit to stand with minimal UE support in < or = to 14 seconds to improve safety and balance    Baseline  11 sec, no UE support    Time  6    Period  Weeks    Status  Achieved      PT LONG TERM GOAL #3   Title  improve LE strength to perform stand to sit transition with controlled descent without UE support    Time  6    Period  Weeks     Status  Achieved      PT LONG TERM GOAL #4   Title  demonstrate 4/5 Lt LE strength to improve safety and endurance with gait at home and in the community    Baseline  Lt hip flexion 4+/5 MMT, abduction and extension 4/5    Time  6    Period  Weeks    Status  Achieved      PT LONG TERM  GOAL #5   Title  Pt will demonstrate atleast 6 point improvement on BERG balance test, to reflect a decrease in her risk of falling and causing injury to herself at home.     Baseline  increased 12 points to 49    Time  6    Period  Weeks    Status  On-going      Additional Long Term Goals   Additional Long Term Goals  Yes      PT LONG TERM GOAL #6   Title  Pt will complete the TUG in less than 14 sec with LRAD to reflect improvements in balance during ambulation at home and in the community.    Baseline  11 sec with SPC, 15 sec without SPC    Time  6    Period  Weeks    Status  Partially Met      PT LONG TERM GOAL #7   Title  Pt will demo improved LE endurance evident by atleast 270f improvement in her 6 MWT which will reflect improvements in her ability to complete grocery shopping during the week.    Time  6    Period  Weeks    Status  New    Target Date  04/29/19            Plan - 03/18/19 1046    Clinical Impression Statement  Pt has made significant progress towards her goals since her last re-evaluation. Her BERG balance score has increased by 12 points and in the recent weeks her single leg balance and balance with turning has improved as well. She is able to complete the TUG in 11 sec with her SPC, a 6 sec improvement from 2 weeks ago. Pt also demonstrates improvements functionally, noting that she is able to get in and out of her car without having to assist her Lt LE. While the pt's LE strength and proprioception has improved, she does still have limitations in single leg balance and with reaching outside her base of support. In addition, her endurance is limited and she notices this  when grocery shopping or completing other prolonged activity during the day. Pt was able to ambulate 800 ft on the 6MWT which is 20[place] than the minimum expected for someone her age. Pt typically is a member of the local gym, however due to COVID-19 she is unable to transition to this. She would greatly continue to benefit from skilled PT to further address her limitations in LE strength, endurance, and proprioception in order to decrease her risk of functional decline and decrease her risk of falling and causing injury to herself.    Rehab Potential  Good    PT Frequency  2x / week    PT Duration  6 weeks    PT Treatment/Interventions  ADLs/Self Care Home Management;Gait training;Stair training;Functional mobility training;Therapeutic activities;Therapeutic exercise;Patient/family education;Neuromuscular re-education;Balance training;Passive range of motion;Taping    PT Next Visit Plan  f/u on walking; updated HEP as necessary; hip abduction/extension/flexion; balance with direction changes and single leg    PT Home Exercise Plan  Access Code: [email or web address]; walking atleast 2 min at a time    Consulted and Agree with Plan of Care  Patient       Patient will benefit from skilled therapeutic intervention in order to improve the following deficits and impairments:  Difficulty walking, Decreased balance, Decreased activity tolerance, Decreased endurance, Decreased strength, Postural dysfunction, Abnormal gait  Visit Diagnosis: 1. Other abnormalities of gait  and mobility   2. Muscle weakness (generalized)   3. Unsteadiness on feet        Problem List Patient Active Problem List   Diagnosis Date Noted  . Osteoporosis   . Abnormal CXR 01/20/2013  . Preop cardiovascular exam 01/20/2013  . Surgery, elective 01/18/2013  . Fracture of pubic ramus ([place]) 11/21/2011  . Hip pain, acute 11/20/2011  . Fall 11/20/2011  . Hyponatremia 11/20/2011  . Hypokalemia 11/20/2011  . Pelvic fracture ([place])  11/20/2011  . HTN (hypertension)   . Concussion   . History of breast cancer in female    11:07 AM,03/18/19 Sherol Dade PT, [place] [place] [address], [place], [place], [address] Phone: [phone]   Fax:  [phone]  Name: Gina Mcgee MRN: [id number] Date of Birth: Oct 11, 1929

## 2019-04-01 ENCOUNTER — Encounter

## 2019-04-05 ENCOUNTER — Ambulatory Visit: Payer: Medicare Other | Attending: Family Medicine | Admitting: Physical Therapy

## 2019-04-05 ENCOUNTER — Other Ambulatory Visit: Payer: Self-pay

## 2019-04-05 ENCOUNTER — Encounter: Payer: Self-pay | Admitting: Physical Therapy

## 2019-04-05 DIAGNOSIS — R2681 Unsteadiness on feet: Secondary | ICD-10-CM | POA: Diagnosis not present

## 2019-04-05 DIAGNOSIS — M6281 Muscle weakness (generalized): Secondary | ICD-10-CM

## 2019-04-05 DIAGNOSIS — R2689 Other abnormalities of gait and mobility: Secondary | ICD-10-CM | POA: Diagnosis not present

## 2019-04-05 NOTE — Therapy (Signed)
[facility] [address] [place], [place], [address] Phone: [phone]   Fax:  [phone]  Physical Therapy Treatment  Patient Details  Name: Gina Mcgee MRN: [id number] Date of Birth: 1929-11-19 Referring Provider (PT): Yaakov Guthrie, MD   Encounter Date: 04/05/2019  PT End of Session - 04/05/19 1543    Visit Number  22    Date for PT Re-Evaluation  05/03/19    Authorization Type  Medicare A and B    Authorization Time Period  03/22/19 to 05/03/19    Authorization - Visit Number  2    Authorization - Number of Visits  10    PT Start Time  2229    PT Stop Time  1540    PT Time Calculation (min)  44 min    Activity Tolerance  No increased pain;Patient tolerated treatment well    Behavior During Therapy  [facility] for tasks assessed/performed       Past Medical History:  Diagnosis Date  . Brain concussion 1960  . Cancer [facility])    left-sided breast cancer  . 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 HTN (hypertension)   . Osteoporosis     Past Surgical History:  Procedure Laterality Date  . BOWEL RESECTION  approx. in 2007   polyp and formed stricture  . BREAST LUMPECTOMY  1981   left lumpectomy w/ radiation  . COLONOSCOPY W/ POLYPECTOMY    . FEMUR SURGERY  2011   left femur pinning  . JOINT REPLACEMENT    . ORIF HUMERUS FRACTURE Left 01/25/2013   Procedure: OPEN REDUCTION INTERNAL FIXATION (ORIF) DISTAL HUMERUS FRACTURE REPAIR RECONSTRUCTION AND ULNA NERVE DECOMPRESSION AND ANTERIOR TRANSPOSITION AS NECESSARY;  Surgeon: Roseanne Kaufman, MD;  Location: [place];  Service: Orthopedics;  Laterality: Left;    There were no vitals filed for this visit.  Subjective Assessment - 04/05/19 1458    Subjective  Pt states things are going well. She is trying to do her exercises every day.    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  Lt hip fracture/pelvic fracture, spinal contractures- 2011/2012,    Diagnostic tests  none     Patient Stated Goals  improve balance, improve strength and endurance    Currently in Pain?  No/denies                       [facility] Adult PT Treatment/Exercise - 04/05/19 0001      Knee/Hip Exercises: Aerobic   Nustep  L2 x3 min, L3 x3 min PT present to discuss walking program       Knee/Hip Exercises: Machines for Strengthening   Total Gym Leg Press  seat 6, incline 3: single leg #45 2x10-12 reps each LE       Knee/Hip Exercises: Supine   Straight Leg Raises  Strengthening;Both;1 set;10 reps    Straight Leg Raises Limitations  1.5#       Knee/Hip Exercises: Sidelying   Hip ABduction  Strengthening;Both;2 sets;10 reps    Hip ABduction Limitations  1.5#          Balance Exercises - 04/05/19 1529      Balance Exercises: Standing   Gait with Head Turns  Forward;1 rep   76f   Tandem Gait  Forward;2 reps   with SPC   Step Over Hurdles / Cones  forward walk  and step over hurdles 4x3 hurdles     Other Standing Exercises  step over/back pool noodle with SPC 2x10 reps         PT Education - 04/05/19 1542    Education Details  technique with therex    Person(s) Educated  Patient    Methods  Explanation    Comprehension  Verbalized understanding       PT Short Term Goals - 03/18/19 1012      PT SHORT TERM GOAL #1   Title  be independent in initial HEP    Time  4    Period  Weeks    Status  Achieved    Target Date  12/08/18      PT SHORT TERM GOAL #2   Title  improve strength to perform sit to stand with minimal to no UE support    Time  4    Period  Weeks    Status  Achieved    Target Date  12/08/18      PT SHORT TERM GOAL #3   Title  demonstrate controlled descent with stand to sit transition with minimal UE support    Time  4    Period  Weeks    Status  Achieved    Target Date  12/08/18        PT Long Term Goals - 04/05/19 1524       PT LONG TERM GOAL #1   Title  be independent in advanced HEP    Time  6    Period  Weeks    Status  On-going      PT LONG TERM GOAL #2   Title  perform 5x sit to stand with minimal UE support in < or = to 14 seconds to improve safety and balance    Baseline  11 sec, no UE support    Time  6    Period  Weeks    Status  Achieved      PT LONG TERM GOAL #3   Title  improve LE strength to perform stand to sit transition with controlled descent without UE support    Time  6    Period  Weeks    Status  Achieved      PT LONG TERM GOAL #4   Title  demonstrate 4/5 Lt LE strength to improve safety and endurance with gait at home and in the community    Baseline  Lt hip flexion 4+/5 MMT, abduction and extension 4/5    Time  6    Period  Weeks    Status  Achieved      PT LONG TERM GOAL #5   Title  Pt will demonstrate atleast 6 point improvement on BERG balance test, to reflect a decrease in her risk of falling and causing injury to herself at home.     Baseline  increased 12 points to 49    Time  6    Period  Weeks    Status  On-going      PT LONG TERM GOAL #6   Title  Pt will complete the TUG in less than 14 sec with LRAD to reflect improvements in balance during ambulation at home and in the community.    Baseline  11 sec with SPC, 15 sec without SPC    Time  6    Period  Weeks    Status  Partially Met      PT LONG TERM GOAL #  7   Title  Pt will demo improved LE endurance evident by atleast 252f improvement in her 6 MWT which will reflect improvements in her ability to complete grocery shopping during the week.    Time  6    Period  Weeks    Status  New            Plan - 04/05/19 1544    Clinical Impression Statement  Pt has been completing her HEP over the past several weeks to make up for her time out of PT. Focused on progressing LE strength and pt was able to complete increased resistance with all exercises. She demonstrated improved LE clearance when completing  step overs with her AD despite LE fatigue towards the end of the session. Pt continues to struggle with dual task ambulation, demonstrating slowed gait with head turns. Will continue to progress LE strength and proprioception.    Rehab Potential  Good    PT Frequency  2x / week    PT Duration  6 weeks    PT Treatment/Interventions  ADLs/Self Care Home Management;Gait training;Stair training;Functional mobility training;Therapeutic activities;Therapeutic exercise;Patient/family education;Neuromuscular re-education;Balance training;Passive range of motion;Taping    PT Next Visit Plan  walking circuit; update HEP as necessary; hip abduction/extension/flexion strength; balance with direction changes and single leg    PT Home Exercise Plan  Access Code: [email or web address]; walking atleast 2 min at a time    Consulted and Agree with Plan of Care  Patient       Patient will benefit from skilled therapeutic intervention in order to improve the following deficits and impairments:  Difficulty walking, Decreased balance, Decreased activity tolerance, Decreased endurance, Decreased strength, Postural dysfunction, Abnormal gait  Visit Diagnosis: 1. Other abnormalities of gait and mobility   2. Muscle weakness (generalized)   3. Unsteadiness on feet        Problem List Patient Active Problem List   Diagnosis Date Noted  . Osteoporosis   . Abnormal CXR 01/20/2013  . Preop cardiovascular exam 01/20/2013  . Surgery, elective 01/18/2013  . [place] 11/21/2011  . Hip pain, acute 11/20/2011  . Fall 11/20/2011  . Hyponatremia 11/20/2011  . Hypokalemia 11/20/2011  . [place]vic [place] ([place] 11/[place]/2011  . HTN (hypertension)   . Concussion   . History of breast cancer in female     3:49 PM,04/05/19 SSherol DadePT, DPT [place] [place] [address]Phone: [phone]  Fax:  [phone] Name: HCHARIZMA GARDINERMRN: [id number] of Birth: [birth year]

## 2019-04-07 DIAGNOSIS — G540 Brachial plexus disorders: Secondary | ICD-10-CM | POA: Diagnosis not present

## 2019-04-07 DIAGNOSIS — M810 Age-related osteoporosis without current pathological fracture: Secondary | ICD-10-CM | POA: Diagnosis not present

## 2019-04-07 DIAGNOSIS — Z86010 Personal history of colonic polyps: Secondary | ICD-10-CM | POA: Diagnosis not present

## 2019-04-07 DIAGNOSIS — J387 Other diseases of larynx: Secondary | ICD-10-CM | POA: Diagnosis not present

## 2019-04-07 DIAGNOSIS — I10 Essential (primary) hypertension: Secondary | ICD-10-CM | POA: Diagnosis not present

## 2019-04-07 DIAGNOSIS — M24542 Contracture, left hand: Secondary | ICD-10-CM | POA: Diagnosis not present

## 2019-04-07 DIAGNOSIS — R269 Unspecified abnormalities of gait and mobility: Secondary | ICD-10-CM | POA: Diagnosis not present

## 2019-04-07 DIAGNOSIS — M40209 Unspecified kyphosis, site unspecified: Secondary | ICD-10-CM | POA: Diagnosis not present

## 2019-04-07 DIAGNOSIS — Z9181 History of falling: Secondary | ICD-10-CM | POA: Diagnosis not present

## 2019-04-07 DIAGNOSIS — R29898 Other symptoms and signs involving the musculoskeletal system: Secondary | ICD-10-CM | POA: Diagnosis not present

## 2019-04-07 DIAGNOSIS — Z148 Genetic carrier of other disease: Secondary | ICD-10-CM | POA: Diagnosis not present

## 2019-04-07 DIAGNOSIS — I83893 Varicose veins of bilateral lower extremities with other complications: Secondary | ICD-10-CM | POA: Diagnosis not present

## 2019-04-12 ENCOUNTER — Encounter: Payer: Self-pay | Admitting: Physical Therapy

## 2019-04-12 ENCOUNTER — Ambulatory Visit: Payer: Medicare Other | Admitting: Physical Therapy

## 2019-04-12 ENCOUNTER — Other Ambulatory Visit: Payer: Self-pay

## 2019-04-12 DIAGNOSIS — R2689 Other abnormalities of gait and mobility: Secondary | ICD-10-CM

## 2019-04-12 DIAGNOSIS — R2681 Unsteadiness on feet: Secondary | ICD-10-CM | POA: Diagnosis not present

## 2019-04-12 DIAGNOSIS — M6281 Muscle weakness (generalized): Secondary | ICD-10-CM

## 2019-04-12 NOTE — Therapy (Signed)
[facility] [address], [place], [place], [address] Phone: [phone]   Fax:  [phone]  Physical Therapy Treatment  Patient Details  Name: Gina Mcgee MRN: [id number] Date of Birth: 07-18-1930 Referring Provider (PT): Yaakov Guthrie, MD   Encounter Date: 04/12/2019  PT End of Session - 04/12/19 1321    Visit Number  23    Date for PT Re-Evaluation  05/03/19    Authorization Type  Medicare A and B    Authorization Time Period  03/22/19 to 05/03/19    Authorization - Visit Number  3    Authorization - Number of Visits  10    PT Start Time  1301    PT Stop Time  4196    PT Time Calculation (min)  44 min    Activity Tolerance  No increased pain;Patient tolerated treatment well    Behavior During Therapy  [facility] for tasks assessed/performed       Past Medical History:  Diagnosis Date  . Brain concussion 1960  . Cancer [facility])    left-sided breast cancer  . 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 HTN (hypertension)   . Osteoporosis     Past Surgical History:  Procedure Laterality Date  . BOWEL RESECTION  approx. in 2007   polyp and formed stricture  . BREAST LUMPECTOMY  1981   left lumpectomy w/ radiation  . COLONOSCOPY W/ POLYPECTOMY    . FEMUR SURGERY  2011   left femur pinning  . JOINT REPLACEMENT    . ORIF HUMERUS FRACTURE Left 01/25/2013   Procedure: OPEN REDUCTION INTERNAL FIXATION (ORIF) DISTAL HUMERUS FRACTURE REPAIR RECONSTRUCTION AND ULNA NERVE DECOMPRESSION AND ANTERIOR TRANSPOSITION AS NECESSARY;  Surgeon: Roseanne Kaufman, MD;  Location: [place];  Service: Orthopedics;  Laterality: Left;    There were no vitals filed for this visit.  Subjective Assessment - 04/12/19 1303    Subjective  Pt states that things are going well. She does feel tired today after a busy weekend and day out shopping.    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  Lt hip fracture/pelvic fracture, spinal contractures- 2011/2012,    Diagnostic tests  none     Patient Stated Goals  improve balance, improve strength and endurance    Currently in Pain?  No/denies                       [facility] Adult PT Treatment/Exercise - 04/12/19 0001      Knee/Hip Exercises: Aerobic   Nustep  L2 x6 min, PT present to discuss HEP updates       Knee/Hip Exercises: Standing   Heel Raises  Both;1 set;20 reps    Knee Flexion  Strengthening;Both;2 sets;15 reps    Knee Flexion Limitations  increased difficulty on Lt     Hip Extension  Stengthening;Both;2 sets;10 reps    Extension Limitations  yellow TB around ankles     Forward Step Up  Both;1 set;15 reps    Forward Step Up Limitations  1 UE support       Knee/Hip Exercises: Seated   Long Arc Quad  Left;Right;Strengthening;2 sets;15 reps    Long Arc Quad Weight  5 lbs.    Marching  Strengthening;Both;2 sets;15 reps    Marching Weights  5 lbs.  PT Education - 04/12/19 1352    Education Details  updated and reviewed HEP    Person(s) Educated  Patient    Methods  Explanation;Handout    Comprehension  Verbalized understanding       PT Short Term Goals - 03/18/19 1012      PT SHORT TERM GOAL #1   Title  be independent in initial HEP    Time  4    Period  Weeks    Status  Achieved    Target Date  12/08/18      PT SHORT TERM GOAL #2   Title  improve strength to perform sit to stand with minimal to no UE support    Time  4    Period  Weeks    Status  Achieved    Target Date  12/08/18      PT SHORT TERM GOAL #3   Title  demonstrate controlled descent with stand to sit transition with minimal UE support    Time  4    Period  Weeks    Status  Achieved    Target Date  12/08/18        PT Long Term Goals - 04/05/19 1524      PT LONG TERM GOAL #1   Title  be independent in advanced HEP    Time  6    Period  Weeks    Status   On-going      PT LONG TERM GOAL #2   Title  perform 5x sit to stand with minimal UE support in < or = to 14 seconds to improve safety and balance    Baseline  11 sec, no UE support    Time  6    Period  Weeks    Status  Achieved      PT LONG TERM GOAL #3   Title  improve LE strength to perform stand to sit transition with controlled descent without UE support    Time  6    Period  Weeks    Status  Achieved      PT LONG TERM GOAL #4   Title  demonstrate 4/5 Lt LE strength to improve safety and endurance with gait at home and in the community    Baseline  Lt hip flexion 4+/5 MMT, abduction and extension 4/5    Time  6    Period  Weeks    Status  Achieved      PT LONG TERM GOAL #5   Title  Pt will demonstrate atleast 6 point improvement on BERG balance test, to reflect a decrease in her risk of falling and causing injury to herself at home.     Baseline  increased 12 points to 49    Time  6    Period  Weeks    Status  On-going      PT LONG TERM GOAL #6   Title  Pt will complete the TUG in less than 14 sec with LRAD to reflect improvements in balance during ambulation at home and in the community.    Baseline  11 sec with SPC, 15 sec without SPC    Time  6    Period  Weeks    Status  Partially Met      PT LONG TERM GOAL #7   Title  Pt will demo improved LE endurance evident by atleast 289f improvement in her 6 MWT which will reflect improvements in her ability to complete  grocery shopping during the week.    Time  6    Period  Weeks    Status  New            Plan - 04/12/19 1344    Clinical Impression Statement  Pt arrived with increased fatigue reported from a busy weekend and morning. She required more frequent rest breaks during her exercises and had some occasions where she was unsteady. Despite this, she was able to maintain her usual resistance with her LE strengthening exercises. No balance activity was completed this session due to pt's increased unsteadiness  and fatigue. Her HEP was updated and she demonstrated understanding of this.    Rehab Potential  Good    PT Frequency  2x / week    PT Duration  6 weeks    PT Treatment/Interventions  ADLs/Self Care Home Management;Gait training;Stair training;Functional mobility training;Therapeutic activities;Therapeutic exercise;Patient/family education;Neuromuscular re-education;Balance training;Passive range of motion;Taping    PT Next Visit Plan  walking circuit; hip abduction/extension/flexion strength; balance with direction changes and single leg    PT Home Exercise Plan  Access Code: [email or web address]; walking atleast 2 min at a time    Consulted and Agree with Plan of Care  Patient       Patient will benefit from skilled therapeutic intervention in order to improve the following deficits and impairments:  Difficulty walking, Decreased balance, Decreased activity tolerance, Decreased endurance, Decreased strength, Postural dysfunction, Abnormal gait  Visit Diagnosis: 1. Other abnormalities of gait and mobility   2. Muscle weakness (generalized)   3. Unsteadiness on feet        Problem List Patient Active Problem List   Diagnosis Date Noted  . Osteoporosis   . Abnormal CXR 01/20/2013  . Preop cardiovascular exam 01/20/2013  . Surgery, elective 01/18/2013  . Fracture of pubic ramus ([place]) 11/21/2011  . Hip pain, acute 11/20/2011  . Fall 11/20/2011  . Hyponatremia 11/20/2011  . Hypokalemia 11/20/2011  . Pelvic fracture ([place]) 11/20/2011  . HTN (hypertension)   . Concussion   . History of breast cancer in female      1:53 PM,04/12/19 Sherol Dade PT, [facility] [address] [place], [place], [address] Phone: [phone]   Fax:  [phone]  Name: CAITLYNN JU MRN: [id number] Date of Birth: Jan 18, 1930

## 2019-04-12 NOTE — Patient Instructions (Signed)
Access Code: [email or web address]  URL: [email or web address]  Date: 04/12/2019  Prepared by: Sherol Dade   Exercises  Standing Hip Abduction with Counter Support - 10 reps - 2 sets - 1x daily - 7x weekly  Standing Tandem Balance with Counter Support - 20 hold - 3x daily - 7x weekly  Side Stepping with Resistance at Thighs and Ankles - 10 reps - 2 sets - 1x daily - 7x weekly  Heel Raise - 20 reps - 1x daily - 7x weekly  Standing Hip Extension with Resistance - 10 reps - 2 sets - 1x daily - 7x weekly    [facility] [address], [place], [place] [address] Phone # [phone] Fax [phone]

## 2019-04-15 ENCOUNTER — Other Ambulatory Visit: Payer: Self-pay

## 2019-04-15 ENCOUNTER — Ambulatory Visit: Payer: Medicare Other | Admitting: Physical Therapy

## 2019-04-15 ENCOUNTER — Encounter: Payer: Self-pay | Admitting: Physical Therapy

## 2019-04-15 DIAGNOSIS — R2689 Other abnormalities of gait and mobility: Secondary | ICD-10-CM | POA: Diagnosis not present

## 2019-04-15 DIAGNOSIS — R2681 Unsteadiness on feet: Secondary | ICD-10-CM | POA: Diagnosis not present

## 2019-04-15 DIAGNOSIS — M6281 Muscle weakness (generalized): Secondary | ICD-10-CM | POA: Diagnosis not present

## 2019-04-15 NOTE — Therapy (Signed)
[facility] [address], [place], [place], [address] Phone: [phone]   Fax:  [phone]  Physical Therapy Treatment  Patient Details  Name: Gina Mcgee MRN: [id number] Date of Birth: April 05, 1930 Referring Provider (PT): Yaakov Guthrie, MD   Encounter Date: 04/15/2019  PT End of Session - 04/15/19 1124    Visit Number  24    Date for PT Re-Evaluation  05/03/19    Authorization Type  Medicare A and B    Authorization Time Period  03/22/19 to 05/03/19    Authorization - Visit Number  4    Authorization - Number of Visits  10    PT Start Time  9518    PT Stop Time  8416    PT Time Calculation (min)  40 min    Activity Tolerance  No increased pain;Patient tolerated treatment well    Behavior During Therapy  [facility] for tasks assessed/performed       Past Medical History:  Diagnosis Date  . Brain concussion 1960  . Cancer [facility])    left-sided breast cancer  . 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 HTN (hypertension)   . Osteoporosis     Past Surgical History:  Procedure Laterality Date  . BOWEL RESECTION  approx. in 2007   polyp and formed stricture  . BREAST LUMPECTOMY  1981   left lumpectomy w/ radiation  . COLONOSCOPY W/ POLYPECTOMY    . FEMUR SURGERY  2011   left femur pinning  . JOINT REPLACEMENT    . ORIF HUMERUS FRACTURE Left 01/25/2013   Procedure: OPEN REDUCTION INTERNAL FIXATION (ORIF) DISTAL HUMERUS FRACTURE REPAIR RECONSTRUCTION AND ULNA NERVE DECOMPRESSION AND ANTERIOR TRANSPOSITION AS NECESSARY;  Surgeon: Roseanne Kaufman, MD;  Location: [place];  Service: Orthopedics;  Laterality: Left;    There were no vitals filed for this visit.  Subjective Assessment - 04/15/19 1059    Subjective  Pt notes that she is feeling much more rested today.    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  Lt hip fracture/pelvic  fracture, spinal contractures- 2011/2012,    Diagnostic tests  none     Patient Stated Goals  improve balance, improve strength and endurance    Currently in Pain?  No/denies                       [facility] Adult PT Treatment/Exercise - 04/15/19 0001      Knee/Hip Exercises: Aerobic   Nustep  L2 x7 min, PT present to discuss session      Knee/Hip Exercises: Seated   Long Arc Quad  Left;Right;Strengthening;2 sets;15 reps    Long Arc Quad Weight  5 lbs.    Marching  Strengthening;Both;2 sets;15 reps    Marching Weights  5 lbs.      Knee/Hip Exercises: Supine   Other Supine Knee/Hip Exercises  straight leg hip extension with green TB resistance 2x10 reps           Balance Exercises - 04/15/19 1109      Balance Exercises: Standing   Gait with Head Turns  Forward;3 reps   x10f each   Other Standing Exercises  walking around 3 cones changing directions x2 trials; forward step over small hurdle x10 reps each, sidestep over small hurdle 2x10 reps each         PT Education - 04/15/19  1203    Education Details  technique with therex    Person(s) Educated  Patient    Methods  Explanation;Verbal cues    Comprehension  Verbalized understanding       PT Short Term Goals - 03/18/19 1012      PT SHORT TERM GOAL #1   Title  be independent in initial HEP    Time  4    Period  Weeks    Status  Achieved    Target Date  12/08/18      PT SHORT TERM GOAL #2   Title  improve strength to perform sit to stand with minimal to no UE support    Time  4    Period  Weeks    Status  Achieved    Target Date  12/08/18      PT SHORT TERM GOAL #3   Title  demonstrate controlled descent with stand to sit transition with minimal UE support    Time  4    Period  Weeks    Status  Achieved    Target Date  12/08/18        PT Long Term Goals - 04/05/19 1524      PT LONG TERM GOAL #1   Title  be independent in advanced HEP    Time  6    Period  Weeks    Status  On-going       PT LONG TERM GOAL #2   Title  perform 5x sit to stand with minimal UE support in < or = to 14 seconds to improve safety and balance    Baseline  11 sec, no UE support    Time  6    Period  Weeks    Status  Achieved      PT LONG TERM GOAL #3   Title  improve LE strength to perform stand to sit transition with controlled descent without UE support    Time  6    Period  Weeks    Status  Achieved      PT LONG TERM GOAL #4   Title  demonstrate 4/5 Lt LE strength to improve safety and endurance with gait at home and in the community    Baseline  Lt hip flexion 4+/5 MMT, abduction and extension 4/5    Time  6    Period  Weeks    Status  Achieved      PT LONG TERM GOAL #5   Title  Pt will demonstrate atleast 6 point improvement on BERG balance test, to reflect a decrease in her risk of falling and causing injury to herself at home.     Baseline  increased 12 points to 49    Time  6    Period  Weeks    Status  On-going      PT LONG TERM GOAL #6   Title  Pt will complete the TUG in less than 14 sec with LRAD to reflect improvements in balance during ambulation at home and in the community.    Baseline  11 sec with SPC, 15 sec without SPC    Time  6    Period  Weeks    Status  Partially Met      PT LONG TERM GOAL #7   Title  Pt will demo improved LE endurance evident by atleast 24f improvement in her 6 MWT which will reflect improvements in her ability to complete grocery shopping during the  week.    Time  6    Period  Weeks    Status  New            Plan - 04/15/19 1220    Clinical Impression Statement  Today's session resumed with balance activity since the pt had improved stamina and steadiness from her last session. Focused on direction changes and head turns, noting good steadiness with walking around cones while using her SPC. Pt had minimal difficulty with resuming LE resistance exercises, but did have some muscle shaking with supine resisted hip extension. She  would like to try this at home with her home aide. Pt denied increase in fatigue end of session.    Rehab Potential  Good    PT Frequency  2x / week    PT Duration  6 weeks    PT Treatment/Interventions  ADLs/Self Care Home Management;Gait training;Stair training;Functional mobility training;Therapeutic activities;Therapeutic exercise;Patient/family education;Neuromuscular re-education;Balance training;Passive range of motion;Taping    PT Next Visit Plan  f/u on supine hip extension at home; hip abduction/extension/flexion strength; balance with direction changes and single leg    PT Home Exercise Plan  Access Code: [email or web address]; walking atleast 2 min at a time    Consulted and Agree with Plan of Care  Patient       Patient will benefit from skilled therapeutic intervention in order to improve the following deficits and impairments:  Difficulty walking, Decreased balance, Decreased activity tolerance, Decreased endurance, Decreased strength, Postural dysfunction, Abnormal gait  Visit Diagnosis: 1. Other abnormalities of gait and mobility   2. Muscle weakness (generalized)   3. Unsteadiness on feet        Problem List Patient Active Problem List   Diagnosis Date Noted  . Osteoporosis   . Abnormal CXR 01/20/2013  . Preop cardiovascular exam 01/20/2013  . Surgery, elective 01/18/2013  . Fracture of pubic ramus ([place]) 11/21/2011  . Hip pain, acute 11/20/2011  . Fall 11/20/2011  . Hyponatremia 11/20/2011  . Hypokalemia 11/20/2011  . Pelvic fracture ([place]) 11/20/2011  . HTN (hypertension)   . Concussion   . History of breast cancer in female     12:34 PM,04/15/19 Sherol Dade PT, [facility] [place]  [place] [address] [place], [place], [address] Phone: [phone]   Fax:  [phone]  Name: Gina Mcgee MRN: [id number] Date of Birth: Jan 30, 1930

## 2019-04-19 ENCOUNTER — Other Ambulatory Visit: Payer: Self-pay

## 2019-04-19 ENCOUNTER — Encounter: Payer: Self-pay | Admitting: Physical Therapy

## 2019-04-19 ENCOUNTER — Ambulatory Visit: Payer: Medicare Other | Admitting: Physical Therapy

## 2019-04-19 DIAGNOSIS — M6281 Muscle weakness (generalized): Secondary | ICD-10-CM

## 2019-04-19 DIAGNOSIS — R2689 Other abnormalities of gait and mobility: Secondary | ICD-10-CM | POA: Diagnosis not present

## 2019-04-19 DIAGNOSIS — R2681 Unsteadiness on feet: Secondary | ICD-10-CM

## 2019-04-19 NOTE — Therapy (Signed)
[facility] [address], [place], [place], [address] Phone: [phone]   Fax:  [phone]  Physical Therapy Treatment  Patient Details  Name: Gina Mcgee MRN: [id number] Date of Birth: 1930/07/15 Referring Provider (PT): Yaakov Guthrie, MD   Encounter Date: 04/19/2019  PT End of Session - 04/19/19 1114    Visit Number  25    Date for PT Re-Evaluation  05/03/19    Authorization Type  Medicare A and B    Authorization Time Period  03/22/19 to 05/03/19    Authorization - Visit Number  5    Authorization - Number of Visits  10    PT Start Time  8921    PT Stop Time  1138    PT Time Calculation (min)  42 min    Activity Tolerance  No increased pain;Patient tolerated treatment well    Behavior During Therapy  [facility] for tasks assessed/performed       Past Medical History:  Diagnosis Date  . Brain concussion 1960  . Cancer [facility])    left-sided breast cancer  . 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 HTN (hypertension)   . Osteoporosis     Past Surgical History:  Procedure Laterality Date  . BOWEL RESECTION  approx. in 2007   polyp and formed stricture  . BREAST LUMPECTOMY  1981   left lumpectomy w/ radiation  . COLONOSCOPY W/ POLYPECTOMY    . FEMUR SURGERY  2011   left femur pinning  . JOINT REPLACEMENT    . ORIF HUMERUS FRACTURE Left 01/25/2013   Procedure: OPEN REDUCTION INTERNAL FIXATION (ORIF) DISTAL HUMERUS FRACTURE REPAIR RECONSTRUCTION AND ULNA NERVE DECOMPRESSION AND ANTERIOR TRANSPOSITION AS NECESSARY;  Surgeon: Roseanne Kaufman, MD;  Location: [place];  Service: Orthopedics;  Laterality: Left;    There were no vitals filed for this visit.  Subjective Assessment - 04/19/19 1058    Subjective  Pt states things are going well. She couldn't remember what her new exercise was.    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  Lt hip fracture/pelvic fracture, spinal contractures- 2011/2012,    Diagnostic tests  none     Patient Stated Goals  improve balance, improve strength and endurance    Currently in Pain?  No/denies                       [facility] Adult PT Treatment/Exercise - 04/19/19 0001      Knee/Hip Exercises: Aerobic   Nustep  L2 x7 min, PT present to discuss HEP update      Knee/Hip Exercises: Standing   Heel Raises  Both;1 set;20 reps    Heel Raises Limitations  #2 ankle weights     Hip Flexion  Stengthening;Both;2 sets;15 reps;Knee bent    Hip Flexion Limitations  1 UE support, 2# ankle weights       Knee/Hip Exercises: Supine   Other Supine Knee/Hip Exercises  straight leg hip extension with green TB resistance x10 reps     Other Supine Knee/Hip Exercises  straight leg bridge on 8" box 2x15 reps           Balance Exercises - 04/19/19 1119      Balance Exercises: Standing   Standing, One Foot on a Step  Eyes closed;6 inch;2 reps;20 secs    Marching Limitations  single leg #2 ankle  weights and 1 UE support 2x15 reps     Other Standing Exercises  step tap with #2 ankle weights and single UE support 2x10 reps; 5# farmer's carry in Rt UE 2x41f       Balance Exercises: Seated   Dynamic Sitting  Eyes opened;No upper extremity support;Reaching outside base of support   x10 reaches; ball catch x15 seated on dyna disc        PT Education - 04/19/19 1127    Education Details  HEP review    Person(s) Educated  Patient    Methods  Explanation;Handout    Comprehension  Verbalized understanding       PT Short Term Goals - 03/18/19 1012      PT SHORT TERM GOAL #1   Title  be independent in initial HEP    Time  4    Period  Weeks    Status  Achieved    Target Date  12/08/18      PT SHORT TERM GOAL #2   Title  improve strength to perform sit to stand with minimal to no UE support    Time  4    Period  Weeks    Status  Achieved    Target Date  12/08/18       PT SHORT TERM GOAL #3   Title  demonstrate controlled descent with stand to sit transition with minimal UE support    Time  4    Period  Weeks    Status  Achieved    Target Date  12/08/18        PT Long Term Goals - 04/05/19 1524      PT LONG TERM GOAL #1   Title  be independent in advanced HEP    Time  6    Period  Weeks    Status  On-going      PT LONG TERM GOAL #2   Title  perform 5x sit to stand with minimal UE support in < or = to 14 seconds to improve safety and balance    Baseline  11 sec, no UE support    Time  6    Period  Weeks    Status  Achieved      PT LONG TERM GOAL #3   Title  improve LE strength to perform stand to sit transition with controlled descent without UE support    Time  6    Period  Weeks    Status  Achieved      PT LONG TERM GOAL #4   Title  demonstrate 4/5 Lt LE strength to improve safety and endurance with gait at home and in the community    Baseline  Lt hip flexion 4+/5 MMT, abduction and extension 4/5    Time  6    Period  Weeks    Status  Achieved      PT LONG TERM GOAL #5   Title  Pt will demonstrate atleast 6 point improvement on BERG balance test, to reflect a decrease in her risk of falling and causing injury to herself at home.     Baseline  increased 12 points to 49    Time  6    Period  Weeks    Status  On-going      PT LONG TERM GOAL #6   Title  Pt will complete the TUG in less than 14 sec with LRAD to reflect improvements in balance during ambulation at home and  in the community.    Baseline  11 sec with SPC, 15 sec without SPC    Time  6    Period  Weeks    Status  Partially Met      PT LONG TERM GOAL #7   Title  Pt will demo improved LE endurance evident by atleast 249f improvement in her 6 MWT which will reflect improvements in her ability to complete grocery shopping during the week.    Time  6    Period  Weeks    Status  New            Plan - 04/19/19 1128    Clinical Impression Statement  Today's  session continued with focus on balance improvements. Pt tends to veer to her Lt when ambulating and required some stand by assistance during today's session. She had difficulty with trunk control during dynamic seated activity but there was no LOB. Introduced farmer's carry this visit to encourage trunk righting response to decrease her tendency to drift. Pt essentially used Lt step to pattern with this. Will continue to address gait and balance issues moving forward.    Rehab Potential  Good    PT Frequency  2x / week    PT Duration  6 weeks    PT Treatment/Interventions  ADLs/Self Care Home Management;Gait training;Stair training;Functional mobility training;Therapeutic activities;Therapeutic exercise;Patient/family education;Neuromuscular re-education;Balance training;Passive range of motion;Taping    PT Next Visit Plan  begin to finalize HEP; f/u on supine hip extension at home; hip abduction/extension/flexion strength; balance with direction changes and single leg    PT Home Exercise Plan  Access Code: [email or web address]; walking atleast 2 min at a time    Consulted and Agree with Plan of Care  Patient       Patient will benefit from skilled therapeutic intervention in order to improve the following deficits and impairments:  Difficulty walking, Decreased balance, Decreased activity tolerance, Decreased endurance, Decreased strength, Postural dysfunction, Abnormal gait  Visit Diagnosis: 1. Other abnormalities of gait and mobility   2. Muscle weakness (generalized)   3. Unsteadiness on feet        Problem List Patient Active Problem List   Diagnosis Date Noted  . Osteoporosis   . Abnormal CXR 01/20/2013  . Preop cardiovascular exam 01/20/2013  . Surgery, elective 01/18/2013  . [place]  . Hip pain, acute 11/20/2011  . Fall 11/20/2011  . Hyponatremia 11/20/2011  . Hypokalemia 11/20/2011  . [place]  . HTN (hypertension)   .  Concussion   . History of breast cancer in female     1:06 PM,04/19/19 SSherol DadePT, [place] [place] [place] [address]Phone: [phone]  Fax:  [phone] Name: Gina BIBLEMRN: [id number] of Birth: [birth year]

## 2019-04-22 ENCOUNTER — Other Ambulatory Visit: Payer: Self-pay

## 2019-04-22 ENCOUNTER — Encounter: Payer: Self-pay | Admitting: Physical Therapy

## 2019-04-22 ENCOUNTER — Ambulatory Visit: Payer: Medicare Other | Admitting: Physical Therapy

## 2019-04-22 DIAGNOSIS — M6281 Muscle weakness (generalized): Secondary | ICD-10-CM

## 2019-04-22 DIAGNOSIS — R2681 Unsteadiness on feet: Secondary | ICD-10-CM | POA: Diagnosis not present

## 2019-04-22 DIAGNOSIS — R2689 Other abnormalities of gait and mobility: Secondary | ICD-10-CM

## 2019-04-22 NOTE — Therapy (Signed)
[facility] [address] [place], [place], [address] Phone: [phone]   Fax:  [phone]  Physical Therapy Treatment  Patient Details  Name: Gina Mcgee MRN: [id number] Date of Birth: [birth year] Referring Provider (PT): Yaakov Guthrie, MD   Encounter Date: 04/22/2019  PT End of Session - 04/22/19 1141    Visit Number  26    Date for PT Re-Evaluation  05/03/19    Authorization Type  Medicare A and B    Authorization Time Period  03/22/19 to 05/03/19    Authorization - Visit Number  6    Authorization - Number of Visits  10    PT Start Time  1100    PT Stop Time  1143    PT Time Calculation (min)  43 min    Activity Tolerance  No increased pain;Patient tolerated treatment well    Behavior During Therapy  [facility] for tasks assessed/performed       Past Medical History:  Diagnosis Date  . Brain concussion 1960  . Cancer [facility])    left-sided breast cancer  . 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 HTN (hypertension)   . Osteoporosis     Past Surgical History:  Procedure Laterality Date  . BOWEL RESECTION  approx. in 2007   polyp and formed stricture  . BREAST LUMPECTOMY  1981   left lumpectomy w/ radiation  . COLONOSCOPY W/ POLYPECTOMY    . FEMUR SURGERY  2011   left femur pinning  . JOINT REPLACEMENT    . ORIF HUMERUS FRACTURE Left 01/25/2013   Procedure: OPEN REDUCTION INTERNAL FIXATION (ORIF) DISTAL HUMERUS FRACTURE REPAIR RECONSTRUCTION AND ULNA NERVE DECOMPRESSION AND ANTERIOR TRANSPOSITION AS NECESSARY;  Surgeon: Roseanne Kaufman, MD;  Location: [place];  Service: Orthopedics;  Laterality: Left;    There were no vitals filed for this visit.  Subjective Assessment - 04/22/19 1102    Subjective  Pt states that she has had a rough couple of days but is better today. She is interested in an updated HEP that she can work on after finishing with PT.    Pertinent History  brain injury 60 years ago-  Lt side impact.  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  Lt hip fracture/pelvic fracture, spinal contractures- 2011/2012,    Diagnostic tests  none     Patient Stated Goals  improve balance, improve strength and endurance    Currently in Pain?  No/denies         [facility] PT Assessment - 04/22/19 0001      Strength   Right Hip Flexion  5/5    Right Hip Extension  4/5    Right Hip ABduction  4/5    Left Hip Flexion  4+/5    Left Hip Extension  4/5    Left Hip ABduction  4/5                   [facility] Adult PT Treatment/Exercise - 04/22/19 0001      Knee/Hip Exercises: Aerobic   Nustep  L3 x8 min, PT present to review HEP updates      Knee/Hip Exercises: Standing   Other Standing Knee Exercises  rows with green TB x10 reps, Lt hand looped in band       Knee/Hip Exercises: Seated   Long Arc Quad  Left;Right;Strengthening;2 sets;15 reps    Long Arc Quad Weight  5 lbs.    Clamshell with TheraBand  Green   2x15 reps    Other Seated Knee/Hip Exercises  hip flexion with yellow TB around knees 2x10 reps each       Knee/Hip Exercises: Supine   Hip Adduction Isometric  Both;1 set;15 reps    Hip Adduction Isometric Limitations  hooklying    Straight Leg Raises  Strengthening;Both;1 set;20 reps             PT Education - 04/22/19 1140    Education Details  updated HEP    Person(s) Educated  Patient    Methods  Explanation;Handout;Verbal cues    Comprehension  Verbalized understanding;Returned demonstration       PT Short Term Goals - 03/18/19 1012      PT SHORT TERM GOAL #1   Title  be independent in initial HEP    Time  4    Period  Weeks    Status  Achieved    Target Date  12/08/18      PT SHORT TERM GOAL #2   Title  improve strength to perform sit to stand with minimal to no UE support    Time  4    Period  Weeks    Status  Achieved    Target Date  12/08/18      PT SHORT TERM GOAL #3   Title  demonstrate controlled descent with  stand to sit transition with minimal UE support    Time  4    Period  Weeks    Status  Achieved    Target Date  12/08/18        PT Long Term Goals - 04/05/19 1524      PT LONG TERM GOAL #1   Title  be independent in advanced HEP    Time  6    Period  Weeks    Status  On-going      PT LONG TERM GOAL #2   Title  perform 5x sit to stand with minimal UE support in < or = to 14 seconds to improve safety and balance    Baseline  11 sec, no UE support    Time  6    Period  Weeks    Status  Achieved      PT LONG TERM GOAL #3   Title  improve LE strength to perform stand to sit transition with controlled descent without UE support    Time  6    Period  Weeks    Status  Achieved      PT LONG TERM GOAL #4   Title  demonstrate 4/5 Lt LE strength to improve safety and endurance with gait at home and in the community    Baseline  Lt hip flexion 4+/5 MMT, abduction and extension 4/5    Time  6    Period  Weeks    Status  Achieved      PT LONG TERM GOAL #5   Title  Pt will demonstrate atleast 6 point improvement on BERG balance test, to reflect a decrease in her risk of falling and causing injury to herself at home.     Baseline  increased 12 points to 49    Time  6    Period  Weeks    Status  On-going      PT LONG TERM GOAL #6   Title  Pt will complete the TUG in less than 14 sec with LRAD to reflect improvements in balance during ambulation at home and in  the community.    Baseline  11 sec with SPC, 15 sec without SPC    Time  6    Period  Weeks    Status  Partially Met      PT LONG TERM GOAL #7   Title  Pt will demo improved LE endurance evident by atleast 277f improvement in her 6 MWT which will reflect improvements in her ability to complete grocery shopping during the week.    Time  6    Period  Weeks    Status  New            Plan - 04/22/19 1141    Clinical Impression Statement  Pt had a rough recent couple of days with noted unsteadiness but she feels that  she is better today. Pt is nearing the end of her POC with likely d/c so we spent today working on updating her HEP. Although she has several exercises already, she requested more that she can be working on at home. Therapist was able to make some additions to further address LE strength and endurance deficits. Will plan for discharge next visit and make additional HEP updates as needed at next session.    Rehab Potential  Good    PT Frequency  2x / week    PT Duration  6 weeks    PT Treatment/Interventions  ADLs/Self Care Home Management;Gait training;Stair training;Functional mobility training;Therapeutic activities;Therapeutic exercise;Patient/family education;Neuromuscular re-education;Balance training;Passive range of motion;Taping    PT Next Visit Plan  begin to finalize HEP; f/u on supine hip extension at home; hip abduction/extension/flexion strength; balance with direction changes and single leg    PT Home Exercise Plan  Access Code: [email or web address]; walking atleast 2 min at a time    Consulted and Agree with Plan of Care  Patient       Patient will benefit from skilled therapeutic intervention in order to improve the following deficits and impairments:  Difficulty walking, Decreased balance, Decreased activity tolerance, Decreased endurance, Decreased strength, Postural dysfunction, Abnormal gait  Visit Diagnosis: 1. Other abnormalities of gait and mobility   2. Muscle weakness (generalized)   3. Unsteadiness on feet        Problem List Patient Active Problem List   Diagnosis Date Noted  . Osteoporosis   . Abnormal CXR 01/20/2013  . Preop cardiovascular exam 01/20/2013  . Surgery, elective 01/18/2013  . [place] 11/21[place]  . Hip pain, acute 11/20/2011  . Fall 11/20/2011  . Hyponatremia 11/20/2011  . Hypokalemia 11/20/2011  . Pelvic fracture ([place] 11/20/2011  . HTN (hypertension)   . Concussion   . History of breast cancer in female     11:50  AM,04/22/19 SSherol DadePT, D[place]at [place] [place] [address]Phone: [phone]  Fax:  [phone] Name: HARYIANA KLINKNERMRN: [id number] of Birth: 105/09/1929

## 2019-04-22 NOTE — Patient Instructions (Signed)
Access Code: [email or web address]  URL: [email or web address]  Date: 04/22/2019  Prepared by: Sherol Dade   Exercises  Standing Hip Abduction with Counter Support - 10 reps - 2 sets - 1x daily - 7x weekly  Standing Tandem Balance with Counter Support - 20 hold - 3x daily - 7x weekly  Side Stepping with Resistance at Thighs and Ankles - 10 reps - 2 sets - 1x daily - 7x weekly  Heel Raise - 20 reps - 1x daily - 7x weekly  Standing Hip Extension with Resistance - 10 reps - 2 sets - 1x daily - 7x weekly  Clamshell - 15 reps - 1x daily - 7x weekly  Seated Long Arc Quad with Ankle Weight - 15 reps - 2 sets - 1x daily - 7x weekly  Supine Active Straight Leg Raise - 20 reps - 1 sets - 1x daily - 7x weekly    [facility] [address] [place], [place] [address] Phone # [phone] Fax [phone]

## 2019-04-26 ENCOUNTER — Encounter: Payer: Medicare Other | Admitting: Physical Therapy

## 2019-04-27 ENCOUNTER — Encounter: Payer: Self-pay | Admitting: Physical Therapy

## 2019-04-27 ENCOUNTER — Other Ambulatory Visit: Payer: Self-pay

## 2019-04-27 ENCOUNTER — Ambulatory Visit: Payer: Medicare Other | Attending: Family Medicine | Admitting: Physical Therapy

## 2019-04-27 DIAGNOSIS — R2689 Other abnormalities of gait and mobility: Secondary | ICD-10-CM | POA: Diagnosis not present

## 2019-04-27 DIAGNOSIS — M6281 Muscle weakness (generalized): Secondary | ICD-10-CM | POA: Diagnosis not present

## 2019-04-27 DIAGNOSIS — R2681 Unsteadiness on feet: Secondary | ICD-10-CM | POA: Diagnosis not present

## 2019-04-27 NOTE — Therapy (Signed)
[facility] [address], [place], [place], [address] Phone: [phone]   Fax:  [phone]  Physical Therapy Treatment Discharge  Patient Details  Name: Gina Mcgee MRN: [id number] Date of Birth: 29-May-1930 Referring Provider (PT): Yaakov Guthrie, MD   Encounter Date: 04/27/2019  PT End of Session - 04/27/19 1048    Visit Number  27    Date for PT Re-Evaluation  05/03/19    Authorization Type  Medicare A and B    Authorization Time Period  03/22/19 to 05/03/19    Authorization - Visit Number  7    Authorization - Number of Visits  10    PT Start Time  1030    PT Stop Time  1115    PT Time Calculation (min)  45 min       Past Medical History:  Diagnosis Date  . Brain concussion 1960  . Cancer [facility])    left-sided breast cancer  . 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 HTN (hypertension)   . Osteoporosis     Past Surgical History:  Procedure Laterality Date  . BOWEL RESECTION  approx. in 2007   polyp and formed stricture  . BREAST LUMPECTOMY  1981   left lumpectomy w/ radiation  . COLONOSCOPY W/ POLYPECTOMY    . FEMUR SURGERY  2011   left femur pinning  . JOINT REPLACEMENT    . ORIF HUMERUS FRACTURE Left 01/25/2013   Procedure: OPEN REDUCTION INTERNAL FIXATION (ORIF) DISTAL HUMERUS FRACTURE REPAIR RECONSTRUCTION AND ULNA NERVE DECOMPRESSION AND ANTERIOR TRANSPOSITION AS NECESSARY;  Surgeon: Roseanne Kaufman, MD;  Location: [place];  Service: Orthopedics;  Laterality: Left;    There were no vitals filed for this visit.  Subjective Assessment - 04/27/19 1044    Subjective  Pt stating that she is still having pain when she wakes up and has been sleeping on on the left side.    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  Lt hip fracture/pelvic fracture, spinal contractures- 2011/2012,    Patient Stated Goals  improve  balance, improve strength and endurance    Currently in Pain?  No/denies                                 PT Short Term Goals - 04/27/19 1101      PT SHORT TERM GOAL #1   Title  be independent in initial HEP    Time  4    Period  Weeks    Status  Achieved      PT SHORT TERM GOAL #2   Title  improve strength to perform sit to stand with minimal to no UE support    Time  4    Period  Weeks    Status  Achieved    Target Date  12/08/18      PT SHORT TERM GOAL #3   Title  demonstrate controlled descent with stand to sit transition with minimal UE support    Time  4    Period  Weeks    Status  Achieved        PT Long Term Goals - 04/27/19 1102      PT LONG TERM GOAL #1   Title  be independent in advanced HEP    Time  6  Period  Weeks    Status  On-going      PT LONG TERM GOAL #2   Title  perform 5x sit to stand with minimal UE support in < or = to 14 seconds to improve safety and balance    Baseline  11 sec, no UE support    Time  6    Period  Weeks    Status  Achieved      PT LONG TERM GOAL #3   Title  improve LE strength to perform stand to sit transition with controlled descent without UE support    Time  6    Status  Achieved      PT LONG TERM GOAL #4   Title  demonstrate 4/5 Lt LE strength to improve safety and endurance with gait at home and in the community    Baseline  Lt hip flexion 4+/5 MMT, abduction and extension 4/5    Time  6    Period  Weeks    Status  Achieved      PT LONG TERM GOAL #5   Title  Pt will demonstrate atleast 6 point improvement on BERG balance test, to reflect a decrease in her risk of falling and causing injury to herself at home.     Baseline  increased 12 points to 49    Time  6    Period  Weeks    Status  Partially Met      PT LONG TERM GOAL #6   Title  Pt will complete the TUG in less than 14 sec with LRAD to reflect improvements in balance during ambulation at home and in the community.     Baseline  11 sec with SPC, 15 sec without SPC    Status  Partially Met      PT LONG TERM GOAL #7   Title  Pt will demo improved LE endurance evident by atleast 22f improvement in her 6 MWT which will reflect improvements in her ability to complete grocery shopping during the week.    Baseline  Pt able to walk 200 feet with straight cane, but pt rerporting that she has someone to do her grocery shopping.    Time  6    Period  Weeks    Status  Partially Met            Plan - 04/27/19 1051    Clinical Impression Statement  Pt tolerating therapy with short rest breaks throughout session. Pt reported she has been working on her HEP. Pt tolearting 4-5# ankle weights with seated exercises as well as red and yellow theraband. Pt verbally reviewed her HEP. Discharge pt this session.    Rehab Potential  Good    PT Frequency  2x / week    PT Duration  6 weeks    PT Treatment/Interventions  ADLs/Self Care Home Management;Gait training;Stair training;Functional mobility training;Therapeutic activities;Therapeutic exercise;Patient/family education;Neuromuscular re-education;Balance training;Passive range of motion;Taping    PT Next Visit Plan  begin to finalize HEP; f/u on supine hip extension at home; hip abduction/extension/flexion strength; balance with direction changes and single leg    PT Home Exercise Plan  Access Code: [email or web address]; walking atleast 2 min at a time    Consulted and Agree with Plan of Care  Patient       Patient will benefit from skilled therapeutic intervention in order to improve the following deficits and impairments:  Difficulty walking, Decreased balance, Decreased activity tolerance, Decreased endurance, Decreased strength, Postural  dysfunction, Abnormal gait  Visit Diagnosis: 1. Other abnormalities of gait and mobility   2. Muscle weakness (generalized)   3. Unsteadiness on feet    PHYSICAL THERAPY DISCHARGE SUMMARY  Visits from Start of Care: 27  Current  functional level related to goals / functional outcomes: See above   Remaining deficits: Balance, see above   Education / Equipment: HEP Plan: Patient agrees to discharge.  Patient goals were partially met. Patient is being discharged due to being pleased with the current functional level.  ?????         Problem List Patient Active Problem List   Diagnosis Date Noted  . Osteoporosis   . Abnormal CXR 01/20/2013  . Preop cardiovascular exam 01/20/2013  . Surgery, elective 01/18/2013  . Fracture of pubic ramus ([place]) 11/21/2011  . Hip pain, acute 11/20/2011  . Fall 11/20/2011  . Hyponatremia 11/20/2011  . Hypokalemia 11/20/2011  . Pelvic fracture ([place]) 11/20/2011  . HTN (hypertension)   . Concussion   . History of breast cancer in female     Oretha Caprice , PT 04/27/2019, 11:08 AM  [facility] [address] [place], [place], [address] Phone: [phone]   Fax:  [phone]  Name: Gina Mcgee MRN: [id number] Date of Birth: March 08, 1930

## 2019-05-20 DIAGNOSIS — Z23 Encounter for immunization: Secondary | ICD-10-CM | POA: Diagnosis not present

## 2019-07-19 DIAGNOSIS — Z961 Presence of intraocular lens: Secondary | ICD-10-CM | POA: Diagnosis not present

## 2019-07-19 DIAGNOSIS — H5010 Unspecified exotropia: Secondary | ICD-10-CM | POA: Diagnosis not present

## 2019-07-19 DIAGNOSIS — H5213 Myopia, bilateral: Secondary | ICD-10-CM | POA: Diagnosis not present

## 2019-10-04 DIAGNOSIS — R109 Unspecified abdominal pain: Secondary | ICD-10-CM | POA: Diagnosis not present

## 2019-10-04 DIAGNOSIS — R319 Hematuria, unspecified: Secondary | ICD-10-CM | POA: Diagnosis not present

## 2019-11-16 ENCOUNTER — Emergency Department (HOSPITAL_COMMUNITY): Payer: Medicare Other

## 2019-11-16 ENCOUNTER — Emergency Department (HOSPITAL_COMMUNITY)
Admission: EM | Admit: 2019-11-16 | Discharge: 2019-11-17 | Disposition: A | Discharge status: Home / Self Care | Payer: Medicare Other | Attending: Emergency Medicine | Admitting: Emergency Medicine

## 2019-11-16 ENCOUNTER — Encounter (HOSPITAL_COMMUNITY): Payer: Self-pay | Admitting: *Deleted

## 2019-11-16 ENCOUNTER — Other Ambulatory Visit: Payer: Self-pay

## 2019-11-16 DIAGNOSIS — I10 Essential (primary) hypertension: Secondary | ICD-10-CM | POA: Diagnosis not present

## 2019-11-16 DIAGNOSIS — N3000 Acute cystitis without hematuria: Secondary | ICD-10-CM

## 2019-11-16 DIAGNOSIS — R4182 Altered mental status, unspecified: Secondary | ICD-10-CM | POA: Diagnosis present

## 2019-11-16 DIAGNOSIS — R296 Repeated falls: Secondary | ICD-10-CM | POA: Insufficient documentation

## 2019-11-16 DIAGNOSIS — Z853 Personal history of malignant neoplasm of breast: Secondary | ICD-10-CM | POA: Diagnosis not present

## 2019-11-16 DIAGNOSIS — R410 Disorientation, unspecified: Secondary | ICD-10-CM

## 2019-11-16 DIAGNOSIS — S0990XA Unspecified injury of head, initial encounter: Secondary | ICD-10-CM | POA: Diagnosis not present

## 2019-11-16 DIAGNOSIS — R0602 Shortness of breath: Secondary | ICD-10-CM | POA: Diagnosis not present

## 2019-11-16 LAB — URINALYSIS, ROUTINE W REFLEX MICROSCOPIC
Bacteria, UA: NONE SEEN
Bilirubin Urine: NEGATIVE
Glucose, UA: NEGATIVE mg/dL
Hgb urine dipstick: NEGATIVE
Ketones, ur: 5 mg/dL — AB
Nitrite: NEGATIVE
Protein, ur: NEGATIVE mg/dL
Specific Gravity, Urine: 1.016 (ref 1.005–1.030)
pH: 6 (ref 5.0–8.0)

## 2019-11-16 LAB — CBC
HCT: 41.4 % (ref 36.0–46.0)
Hemoglobin: 13 g/dL (ref 12.0–15.0)
MCH: 29 pg (ref 26.0–34.0)
MCHC: 31.4 g/dL (ref 30.0–36.0)
MCV: 92.2 fL (ref 80.0–100.0)
Platelets: 241 10*3/uL (ref 150–400)
RBC: 4.49 MIL/uL (ref 3.87–5.11)
RDW: 13.1 % (ref 11.5–15.5)
WBC: 5.7 10*3/uL (ref 4.0–10.5)
nRBC: 0 % (ref 0.0–0.2)

## 2019-11-16 LAB — COMPREHENSIVE METABOLIC PANEL
ALT: 16 U/L (ref 0–44)
AST: 27 U/L (ref 15–41)
Albumin: 4.1 g/dL (ref 3.5–5.0)
Alkaline Phosphatase: 104 U/L (ref 38–126)
Anion gap: 9 (ref 5–15)
BUN: 22 mg/dL (ref 8–23)
CO2: 29 mmol/L (ref 22–32)
Calcium: 9.8 mg/dL (ref 8.9–10.3)
Chloride: 102 mmol/L (ref 98–111)
Creatinine, Ser: 0.92 mg/dL (ref 0.44–1.00)
GFR calc Af Amer: >60 mL/min (ref >60)
GFR calc non Af Amer: 56 mL/min — ABNORMAL LOW (ref >60)
Glucose, Bld: 104 mg/dL — ABNORMAL HIGH (ref 70–99)
Potassium: 3.3 mmol/L — ABNORMAL LOW (ref 3.5–5.1)
Sodium: 140 mmol/L (ref 135–145)
Total Bilirubin: 0.4 mg/dL (ref 0.3–1.2)
Total Protein: 7.1 g/dL (ref 6.5–8.1)

## 2019-11-16 MED ORDER — SODIUM CHLORIDE 0.9% FLUSH: 3.0000 mL | Freq: Once | INTRAVENOUS | Status: DC

## 2019-11-16 NOTE — ED Triage Notes (Signed)
Pt said she lost her balance and fell (slid down furniture) Tues.  The aid that takes care of her on Thurs stated that she was significantly more forgetful today.   Denies hitting her head. States some sob.  L sided deficits from injury in her 20's.

## 2019-11-16 NOTE — ED Provider Notes (Signed)
Plan at signout to f/u on imaging/labs, likely discharge   Ripley Fraise, MD 11/16/19 [phone]

## 2019-11-16 NOTE — ED Notes (Signed)
Pt to CT

## 2019-11-16 NOTE — ED Provider Notes (Signed)
[facility] Emergency Department Provider Note MRN:  [id number]  Arrival date & time: 11/16/19     Chief Complaint   Fall and Altered Mental Status   History of Present Illness   Gina Mcgee is a 86 y.o. year-old female with a history of hypertension presenting to the ED with chief complaint of fall and altered mental status.  3 or 4 falls in the past 2 or 3 days.  Increased confusion for the past few months but much worse over the past 1 or 2 days.  Denies dysuria or hematuria, denies any injuries from the fall, denies head trauma, no nausea or vomiting, no chest pain or shortness of breath, no abdominal pain, no recent fever or illness.  History obtained largely from patient's daughter.  Review of Systems  A complete 10 system review of systems was obtained and all systems are negative except as noted in the HPI and PMH.   Patient's Health History    Past Medical History:  Diagnosis Date  . Brain concussion 1960  . Cancer [facility])    left-sided breast cancer  . 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 HTN (hypertension)   . Osteoporosis     Past Surgical History:  Procedure Laterality Date  . BOWEL RESECTION  approx. in 2007   polyp and formed stricture  . BREAST LUMPECTOMY  1981   left lumpectomy w/ radiation  . COLONOSCOPY W/ POLYPECTOMY    . FEMUR SURGERY  2011   left femur pinning  . JOINT REPLACEMENT    . ORIF HUMERUS FRACTURE Left 01/25/2013   Procedure: OPEN REDUCTION INTERNAL FIXATION (ORIF) DISTAL HUMERUS FRACTURE REPAIR RECONSTRUCTION AND ULNA NERVE DECOMPRESSION AND ANTERIOR TRANSPOSITION AS NECESSARY;  Surgeon: Gina Kaufman, MD;  Location: [place];  Service: Orthopedics;  Laterality: Left;    Family History  Problem Relation Age of Onset  . Stroke Mother   . Lung cancer Father   . Stroke Father     Social History   Socioeconomic History  . Marital status: Widowed    Spouse name: Not on file  . Number of children:  Not on file  . Years of education: Not on file  . Highest education level: Not on file  Occupational History  . Not on file  Tobacco Use  . Smoking status: Never Smoker  . Smokeless tobacco: Never Used  Substance and Sexual Activity  . Alcohol use: Not Currently  . Drug use: No  . Sexual activity: Not on file  Other Topics Concern  . Not on file  Social History Narrative  . Not on file   Social Determinants of Health   Financial Resource Strain:   . Difficulty of Paying Living Expenses: Not on file  Food Insecurity:   . Worried About Charity fundraiser in the Last Year: Not on file  . Ran Out of Food in the Last Year: Not on file  Transportation Needs:   . Lack of Transportation (Medical): Not on file  . Lack of Transportation (Non-Medical): Not on file  Physical Activity:   . Days of Exercise per Week: Not on file  . Minutes of Exercise per Session: Not on file  Stress:   . Feeling of Stress : Not on file  Social Connections:   . Frequency of Communication with Friends and Family: Not on file  . Frequency of Social Gatherings with Friends and Family: Not on file  . Attends Religious Services: Not on  file  . Active Member of Clubs or Organizations: Not on file  . Attends Archivist Meetings: Not on file  . Marital Status: Not on file  Intimate Partner Violence:   . Fear of Current or Ex-Partner: Not on file  . Emotionally Abused: Not on file  . Physically Abused: Not on file  . Sexually Abused: Not on file     Physical Exam   Vitals:   11/16/19 2008 11/16/19 2200  BP: (!) 162/86 (!) 153/84  Pulse: 81 79  Resp: 18 (!) 24  Temp: 98.3 F (36.8 C)   SpO2: 98% 91%    CONSTITUTIONAL:  well-appearing, NAD NEURO:  Alert and oriented x 3, mild decreased strength and sensation to the left arm and leg EYES:  eyes equal and reactive ENT/NECK:  no LAD, no JVD CARDIO:  regular rate, well-perfused, normal S1 and S2 PULM:  CTAB no wheezing or rhonchi GI/GU:   normal bowel sounds, non-distended, non-tender MSK/SPINE:  No gross deformities, no edema SKIN:  no rash, atraumatic PSYCH:  Appropriate speech and behavior  *Additional and/or pertinent findings included in MDM below  Diagnostic and Interventional Summary    EKG Interpretation  Date/Time:  Wednesday November 16 2019 17:04:57 EST Ventricular Rate:  86 PR Interval:  172 QRS Duration: 72 QT Interval:  370 QTC Calculation: 442 R Axis:   -23 Text Interpretation: Sinus rhythm with Premature atrial complexes Low voltage QRS Borderline ECG Confirmed by Gina Mcgee [phone]) on 11/16/2019 9:18:02 PM      Cardiac Monitoring Interpretation:  Labs Reviewed  COMPREHENSIVE METABOLIC PANEL - Abnormal; Notable for the following components:      Result Value   Potassium 3.3 (*)    Glucose, Bld 104 (*)    GFR calc non Af Amer 56 (*)    All other components within normal limits  URINE CULTURE  CBC  URINALYSIS, ROUTINE W REFLEX MICROSCOPIC  CBG MONITORING, ED    DG Chest 2 View  Final Result    CT Head Wo Contrast    (Results Pending)    Medications  sodium chloride flush (NS) 0.9 % injection 3 mL (has no administration in time range)     Procedures  /  Critical Care Procedures  ED Course and Medical Decision Making  I have reviewed the triage vital signs, the nursing notes, and pertinent available records from the EMR.  Pertinent labs & imaging results that were available during my care of the patient were reviewed by me and considered in my medical decision making (see below for details).     Suspect underlying dementia, however obtain CT head to exclude subdural given the recent falls, will obtain urinalysis to exclude UTI.  Labs are reassuring, anticipating discharge.  Patient has baseline deficits to the left side of her body which are unchanged.  10:54 PM update: Still awaiting CT head and urinalysis, signed out to oncoming provider at shift change.  If UTI, family still  comfortable with discharge on oral antibiotics.  No signs of systemic illness.  Gina Mcgee. Gina Small, MD [place] [email or web address]  Final Clinical Impressions(s) / ED Diagnoses     ICD-10-CM   1. Confusion  R41.0     ED Discharge Orders    None       Discharge Instructions Discussed with and Provided to Patient:   Discharge Instructions   None       Maudie Flakes, MD 11/16/19 2255

## 2019-11-16 NOTE — Discharge Instructions (Signed)
You were evaluated in the Emergency Department and after careful evaluation, we did not find any emergent condition requiring admission or further testing in the hospital.  Your exam/testing today is overall reassuring.  Please return to the Emergency Department if you experience any worsening of your condition.  We encourage you to follow up with a primary care provider.  Thank you for allowing us to be a part of your care. 

## 2019-11-17 ENCOUNTER — Ambulatory Visit: Payer: Medicare Other

## 2019-11-17 MED ORDER — CEPHALEXIN 500 MG PO CAPS: 500.0000 mg | ORAL_CAPSULE | Freq: Two times a day (BID) | ORAL | 0 refills | Status: DC

## 2019-11-17 NOTE — ED Provider Notes (Signed)
Patient found to have UTI.  She will be given Keflex.  Also informed of her compression fracture, patient reports she is aware of this already.  She is having no pain at this time.  Also possible NPH on CT head This was discussed with patient and daughter.  Refer to neurology. Patient has caregiver every day and feeling in reasonable region.  She feels comfortable for discharge   Ripley Fraise, MD 11/17/19 [phone]

## 2019-11-17 NOTE — ED Notes (Signed)
Patient verbalizes understanding of discharge instructions. Opportunity for questioning and answers were provided. Armband removed by staff, pt discharged from ED.  

## 2019-11-18 ENCOUNTER — Ambulatory Visit: Payer: Medicare Other

## 2019-11-18 DIAGNOSIS — R296 Repeated falls: Secondary | ICD-10-CM | POA: Diagnosis not present

## 2019-11-18 DIAGNOSIS — R32 Unspecified urinary incontinence: Secondary | ICD-10-CM | POA: Diagnosis not present

## 2019-11-18 DIAGNOSIS — R413 Other amnesia: Secondary | ICD-10-CM | POA: Diagnosis not present

## 2019-11-18 DIAGNOSIS — R9089 Other abnormal findings on diagnostic imaging of central nervous system: Secondary | ICD-10-CM | POA: Diagnosis not present

## 2019-11-18 LAB — URINE CULTURE

## 2019-11-21 ENCOUNTER — Ambulatory Visit: Payer: Medicare Other | Attending: Internal Medicine

## 2019-11-21 DIAGNOSIS — Z23 Encounter for immunization: Secondary | ICD-10-CM | POA: Insufficient documentation

## 2019-11-21 NOTE — Progress Notes (Signed)
   Covid-19 Vaccination Clinic  Name:  Gina Mcgee    MRN: [id number] DOB: Aug 10, 1930  11/21/2019  Gina Mcgee was observed post Covid-19 immunization for 15 minutes without incidence. She was provided with Vaccine Information Sheet and instruction to access the V-Safe system.   Gina Mcgee was instructed to call 911 with any severe reactions post vaccine: Marland Kitchen Difficulty breathing  . Swelling of your face and throat  . A fast heartbeat  . A bad rash all over your body  . Dizziness and weakness    Immunizations Administered    Name Date Dose VIS Date Route   Pfizer COVID-19 Vaccine 11/21/2019 10:37 AM 0.3 mL 09/02/2019 Intramuscular   Manufacturer: [place]   Lot: [id number]   [place]: [id number]

## 2019-12-16 DIAGNOSIS — R300 Dysuria: Secondary | ICD-10-CM | POA: Diagnosis not present

## 2019-12-20 ENCOUNTER — Ambulatory Visit: Payer: Medicare Other | Attending: Internal Medicine

## 2019-12-20 DIAGNOSIS — Z23 Encounter for immunization: Secondary | ICD-10-CM

## 2019-12-20 NOTE — Progress Notes (Signed)
   Covid-19 Vaccination Clinic  Name:  Gina Mcgee    MRN: [id number] DOB: 1930-08-26  12/20/2019  Gina Mcgee was observed post Covid-19 immunization for 15 minutes without incident. She was provided with Vaccine Information Sheet and instruction to access the V-Safe system.   Gina Mcgee was instructed to call 911 with any severe reactions post vaccine: Marland Kitchen Difficulty breathing  . Swelling of face and throat  . A fast heartbeat  . A bad rash all over body  . Dizziness and weakness   Immunizations Administered    Name Date Dose VIS Date Route   Pfizer COVID-19 Vaccine 12/20/2019 11:26 AM 0.3 mL 09/02/2019 Intramuscular   Manufacturer: [place]   Lot: [id number]   [place]: [id number]

## 2020-01-11 DIAGNOSIS — R419 Unspecified symptoms and signs involving cognitive functions and awareness: Secondary | ICD-10-CM | POA: Diagnosis not present

## 2020-01-11 DIAGNOSIS — G912 (Idiopathic) normal pressure hydrocephalus: Secondary | ICD-10-CM | POA: Diagnosis not present

## 2020-01-11 DIAGNOSIS — R413 Other amnesia: Secondary | ICD-10-CM | POA: Diagnosis not present

## 2020-01-11 DIAGNOSIS — R269 Unspecified abnormalities of gait and mobility: Secondary | ICD-10-CM | POA: Diagnosis not present

## 2020-01-11 DIAGNOSIS — R3989 Other symptoms and signs involving the genitourinary system: Secondary | ICD-10-CM | POA: Diagnosis not present

## 2020-01-19 DIAGNOSIS — E538 Deficiency of other specified B group vitamins: Secondary | ICD-10-CM | POA: Diagnosis not present

## 2020-01-25 DIAGNOSIS — G912 (Idiopathic) normal pressure hydrocephalus: Secondary | ICD-10-CM | POA: Diagnosis not present

## 2020-01-26 DIAGNOSIS — D510 Vitamin B12 deficiency anemia due to intrinsic factor deficiency: Secondary | ICD-10-CM | POA: Diagnosis not present

## 2020-01-27 DIAGNOSIS — G08 Intracranial and intraspinal phlebitis and thrombophlebitis: Secondary | ICD-10-CM | POA: Diagnosis not present

## 2020-02-02 DIAGNOSIS — D510 Vitamin B12 deficiency anemia due to intrinsic factor deficiency: Secondary | ICD-10-CM | POA: Diagnosis not present

## 2020-02-09 DIAGNOSIS — E538 Deficiency of other specified B group vitamins: Secondary | ICD-10-CM | POA: Diagnosis not present

## 2020-02-10 DIAGNOSIS — S20211A Contusion of right front wall of thorax, initial encounter: Secondary | ICD-10-CM | POA: Diagnosis not present

## 2020-03-02 DIAGNOSIS — R4189 Other symptoms and signs involving cognitive functions and awareness: Secondary | ICD-10-CM | POA: Diagnosis not present

## 2020-03-02 DIAGNOSIS — G08 Intracranial and intraspinal phlebitis and thrombophlebitis: Secondary | ICD-10-CM | POA: Diagnosis not present

## 2020-03-02 DIAGNOSIS — R9082 White matter disease, unspecified: Secondary | ICD-10-CM | POA: Diagnosis not present

## 2020-03-02 DIAGNOSIS — Z7901 Long term (current) use of anticoagulants: Secondary | ICD-10-CM | POA: Diagnosis not present

## 2020-03-02 DIAGNOSIS — G3184 Mild cognitive impairment, so stated: Secondary | ICD-10-CM | POA: Diagnosis not present

## 2020-03-08 DIAGNOSIS — D510 Vitamin B12 deficiency anemia due to intrinsic factor deficiency: Secondary | ICD-10-CM | POA: Diagnosis not present

## 2020-04-12 DIAGNOSIS — D510 Vitamin B12 deficiency anemia due to intrinsic factor deficiency: Secondary | ICD-10-CM | POA: Diagnosis not present

## 2020-05-15 DIAGNOSIS — E538 Deficiency of other specified B group vitamins: Secondary | ICD-10-CM | POA: Diagnosis not present

## 2020-06-12 DIAGNOSIS — N390 Urinary tract infection, site not specified: Secondary | ICD-10-CM | POA: Diagnosis not present

## 2020-06-12 DIAGNOSIS — D510 Vitamin B12 deficiency anemia due to intrinsic factor deficiency: Secondary | ICD-10-CM | POA: Diagnosis not present

## 2020-06-12 DIAGNOSIS — R319 Hematuria, unspecified: Secondary | ICD-10-CM | POA: Diagnosis not present

## 2020-06-12 DIAGNOSIS — E538 Deficiency of other specified B group vitamins: Secondary | ICD-10-CM | POA: Diagnosis not present

## 2020-06-12 DIAGNOSIS — R509 Fever, unspecified: Secondary | ICD-10-CM | POA: Diagnosis not present

## 2020-06-21 DIAGNOSIS — Z23 Encounter for immunization: Secondary | ICD-10-CM | POA: Diagnosis not present

## 2020-06-28 DIAGNOSIS — L821 Other seborrheic keratosis: Secondary | ICD-10-CM | POA: Diagnosis not present

## 2020-06-28 DIAGNOSIS — C44511 Basal cell carcinoma of skin of breast: Secondary | ICD-10-CM | POA: Diagnosis not present

## 2020-06-28 DIAGNOSIS — D1801 Hemangioma of skin and subcutaneous tissue: Secondary | ICD-10-CM | POA: Diagnosis not present

## 2020-06-28 DIAGNOSIS — Z85828 Personal history of other malignant neoplasm of skin: Secondary | ICD-10-CM | POA: Diagnosis not present

## 2020-06-28 DIAGNOSIS — D485 Neoplasm of uncertain behavior of skin: Secondary | ICD-10-CM | POA: Diagnosis not present

## 2020-06-28 DIAGNOSIS — L820 Inflamed seborrheic keratosis: Secondary | ICD-10-CM | POA: Diagnosis not present

## 2020-07-10 DIAGNOSIS — M545 Low back pain, unspecified: Secondary | ICD-10-CM | POA: Diagnosis not present

## 2020-07-10 DIAGNOSIS — E538 Deficiency of other specified B group vitamins: Secondary | ICD-10-CM | POA: Diagnosis not present

## 2020-07-26 DIAGNOSIS — Z23 Encounter for immunization: Secondary | ICD-10-CM | POA: Diagnosis not present

## 2020-07-31 DIAGNOSIS — H532 Diplopia: Secondary | ICD-10-CM | POA: Diagnosis not present

## 2020-07-31 DIAGNOSIS — Z961 Presence of intraocular lens: Secondary | ICD-10-CM | POA: Diagnosis not present

## 2020-07-31 DIAGNOSIS — H353131 Nonexudative age-related macular degeneration, bilateral, early dry stage: Secondary | ICD-10-CM | POA: Diagnosis not present

## 2020-07-31 DIAGNOSIS — H5213 Myopia, bilateral: Secondary | ICD-10-CM | POA: Diagnosis not present

## 2020-08-08 DIAGNOSIS — E538 Deficiency of other specified B group vitamins: Secondary | ICD-10-CM | POA: Diagnosis not present

## 2020-08-28 DIAGNOSIS — G08 Intracranial and intraspinal phlebitis and thrombophlebitis: Secondary | ICD-10-CM | POA: Diagnosis not present

## 2020-08-28 DIAGNOSIS — R413 Other amnesia: Secondary | ICD-10-CM | POA: Diagnosis not present

## 2020-08-28 DIAGNOSIS — Z7901 Long term (current) use of anticoagulants: Secondary | ICD-10-CM | POA: Diagnosis not present

## 2020-08-28 DIAGNOSIS — G3184 Mild cognitive impairment, so stated: Secondary | ICD-10-CM | POA: Diagnosis not present

## 2020-09-04 DIAGNOSIS — K048 Radicular cyst: Secondary | ICD-10-CM | POA: Diagnosis not present

## 2020-09-07 DIAGNOSIS — D510 Vitamin B12 deficiency anemia due to intrinsic factor deficiency: Secondary | ICD-10-CM | POA: Diagnosis not present

## 2020-09-17 DIAGNOSIS — R319 Hematuria, unspecified: Secondary | ICD-10-CM | POA: Diagnosis not present

## 2020-09-27 DIAGNOSIS — R319 Hematuria, unspecified: Secondary | ICD-10-CM | POA: Diagnosis not present

## 2020-09-27 DIAGNOSIS — G08 Intracranial and intraspinal phlebitis and thrombophlebitis: Secondary | ICD-10-CM | POA: Diagnosis not present

## 2020-10-18 DIAGNOSIS — R319 Hematuria, unspecified: Secondary | ICD-10-CM | POA: Diagnosis not present

## 2020-10-18 DIAGNOSIS — E538 Deficiency of other specified B group vitamins: Secondary | ICD-10-CM | POA: Diagnosis not present

## 2020-10-31 DIAGNOSIS — R310 Gross hematuria: Secondary | ICD-10-CM | POA: Diagnosis not present

## 2020-10-31 DIAGNOSIS — R319 Hematuria, unspecified: Secondary | ICD-10-CM | POA: Diagnosis not present

## 2020-10-31 DIAGNOSIS — K529 Noninfective gastroenteritis and colitis, unspecified: Secondary | ICD-10-CM | POA: Diagnosis not present

## 2020-10-31 DIAGNOSIS — C679 Malignant neoplasm of bladder, unspecified: Secondary | ICD-10-CM | POA: Diagnosis not present

## 2020-11-15 DIAGNOSIS — D510 Vitamin B12 deficiency anemia due to intrinsic factor deficiency: Secondary | ICD-10-CM | POA: Diagnosis not present

## 2020-11-26 DIAGNOSIS — K219 Gastro-esophageal reflux disease without esophagitis: Secondary | ICD-10-CM | POA: Insufficient documentation

## 2020-11-26 DIAGNOSIS — I517 Cardiomegaly: Secondary | ICD-10-CM | POA: Diagnosis not present

## 2020-11-26 DIAGNOSIS — D510 Vitamin B12 deficiency anemia due to intrinsic factor deficiency: Secondary | ICD-10-CM | POA: Insufficient documentation

## 2020-11-26 DIAGNOSIS — C679 Malignant neoplasm of bladder, unspecified: Secondary | ICD-10-CM | POA: Diagnosis not present

## 2020-11-26 DIAGNOSIS — Z01818 Encounter for other preprocedural examination: Secondary | ICD-10-CM | POA: Diagnosis not present

## 2020-11-26 DIAGNOSIS — R9431 Abnormal electrocardiogram [ECG] [EKG]: Secondary | ICD-10-CM | POA: Diagnosis not present

## 2020-11-29 DIAGNOSIS — C679 Malignant neoplasm of bladder, unspecified: Secondary | ICD-10-CM | POA: Diagnosis not present

## 2020-11-29 DIAGNOSIS — C678 Malignant neoplasm of overlapping sites of bladder: Secondary | ICD-10-CM | POA: Diagnosis not present

## 2020-11-29 DIAGNOSIS — F05 Delirium due to known physiological condition: Secondary | ICD-10-CM | POA: Diagnosis not present

## 2020-11-30 DIAGNOSIS — F05 Delirium due to known physiological condition: Secondary | ICD-10-CM | POA: Diagnosis not present

## 2020-11-30 DIAGNOSIS — C678 Malignant neoplasm of overlapping sites of bladder: Secondary | ICD-10-CM | POA: Diagnosis not present

## 2020-12-03 DIAGNOSIS — C678 Malignant neoplasm of overlapping sites of bladder: Secondary | ICD-10-CM | POA: Diagnosis not present

## 2020-12-03 DIAGNOSIS — C679 Malignant neoplasm of bladder, unspecified: Secondary | ICD-10-CM | POA: Diagnosis not present

## 2020-12-13 DIAGNOSIS — E538 Deficiency of other specified B group vitamins: Secondary | ICD-10-CM | POA: Diagnosis not present

## 2020-12-20 DIAGNOSIS — Z888 Allergy status to other drugs, medicaments and biological substances status: Secondary | ICD-10-CM | POA: Diagnosis not present

## 2020-12-20 DIAGNOSIS — R531 Weakness: Secondary | ICD-10-CM | POA: Diagnosis not present

## 2020-12-20 DIAGNOSIS — Z882 Allergy status to sulfonamides status: Secondary | ICD-10-CM | POA: Diagnosis not present

## 2020-12-20 DIAGNOSIS — Z87891 Personal history of nicotine dependence: Secondary | ICD-10-CM | POA: Diagnosis not present

## 2020-12-20 DIAGNOSIS — R1310 Dysphagia, unspecified: Secondary | ICD-10-CM | POA: Diagnosis not present

## 2020-12-20 DIAGNOSIS — Z886 Allergy status to analgesic agent status: Secondary | ICD-10-CM | POA: Diagnosis not present

## 2020-12-20 DIAGNOSIS — Z881 Allergy status to other antibiotic agents status: Secondary | ICD-10-CM | POA: Diagnosis not present

## 2020-12-20 DIAGNOSIS — Z7901 Long term (current) use of anticoagulants: Secondary | ICD-10-CM | POA: Diagnosis not present

## 2020-12-25 DIAGNOSIS — R1310 Dysphagia, unspecified: Secondary | ICD-10-CM | POA: Diagnosis not present

## 2020-12-25 DIAGNOSIS — R1312 Dysphagia, oropharyngeal phase: Secondary | ICD-10-CM | POA: Diagnosis not present

## 2021-01-03 DIAGNOSIS — C679 Malignant neoplasm of bladder, unspecified: Secondary | ICD-10-CM | POA: Diagnosis not present

## 2021-01-10 DIAGNOSIS — R8290 Unspecified abnormal findings in urine: Secondary | ICD-10-CM | POA: Diagnosis not present

## 2021-01-10 DIAGNOSIS — C679 Malignant neoplasm of bladder, unspecified: Secondary | ICD-10-CM | POA: Diagnosis not present

## 2021-01-11 DIAGNOSIS — R531 Weakness: Secondary | ICD-10-CM | POA: Diagnosis not present

## 2021-01-16 DIAGNOSIS — C679 Malignant neoplasm of bladder, unspecified: Secondary | ICD-10-CM | POA: Diagnosis not present

## 2021-01-16 DIAGNOSIS — R8290 Unspecified abnormal findings in urine: Secondary | ICD-10-CM | POA: Diagnosis not present

## 2021-01-31 DIAGNOSIS — D510 Vitamin B12 deficiency anemia due to intrinsic factor deficiency: Secondary | ICD-10-CM | POA: Diagnosis not present

## 2021-02-07 DIAGNOSIS — C672 Malignant neoplasm of lateral wall of bladder: Secondary | ICD-10-CM | POA: Diagnosis not present

## 2021-02-14 DIAGNOSIS — C678 Malignant neoplasm of overlapping sites of bladder: Secondary | ICD-10-CM | POA: Diagnosis not present

## 2021-02-21 DIAGNOSIS — C679 Malignant neoplasm of bladder, unspecified: Secondary | ICD-10-CM | POA: Diagnosis not present

## 2021-02-28 DIAGNOSIS — C672 Malignant neoplasm of lateral wall of bladder: Secondary | ICD-10-CM | POA: Diagnosis not present

## 2021-03-05 DIAGNOSIS — D510 Vitamin B12 deficiency anemia due to intrinsic factor deficiency: Secondary | ICD-10-CM | POA: Diagnosis not present

## 2021-03-07 DIAGNOSIS — C672 Malignant neoplasm of lateral wall of bladder: Secondary | ICD-10-CM | POA: Diagnosis not present

## 2021-03-21 DIAGNOSIS — Z888 Allergy status to other drugs, medicaments and biological substances status: Secondary | ICD-10-CM | POA: Diagnosis not present

## 2021-03-21 DIAGNOSIS — Z7901 Long term (current) use of anticoagulants: Secondary | ICD-10-CM | POA: Diagnosis not present

## 2021-03-21 DIAGNOSIS — R4189 Other symptoms and signs involving cognitive functions and awareness: Secondary | ICD-10-CM | POA: Diagnosis not present

## 2021-03-21 DIAGNOSIS — Z886 Allergy status to analgesic agent status: Secondary | ICD-10-CM | POA: Diagnosis not present

## 2021-03-21 DIAGNOSIS — G3184 Mild cognitive impairment, so stated: Secondary | ICD-10-CM | POA: Diagnosis not present

## 2021-03-21 DIAGNOSIS — Z882 Allergy status to sulfonamides status: Secondary | ICD-10-CM | POA: Diagnosis not present

## 2021-03-21 DIAGNOSIS — G08 Intracranial and intraspinal phlebitis and thrombophlebitis: Secondary | ICD-10-CM | POA: Diagnosis not present

## 2021-03-21 DIAGNOSIS — Z881 Allergy status to other antibiotic agents status: Secondary | ICD-10-CM | POA: Diagnosis not present

## 2021-03-21 DIAGNOSIS — Z91041 Radiographic dye allergy status: Secondary | ICD-10-CM | POA: Diagnosis not present

## 2021-03-25 ENCOUNTER — Ambulatory Visit (HOSPITAL_COMMUNITY)
Admission: EM | Admit: 2021-03-25 | Discharge: 2021-03-25 | Disposition: A | Discharge status: Home / Self Care | Payer: Medicare Other

## 2021-03-25 ENCOUNTER — Encounter (HOSPITAL_COMMUNITY): Payer: Self-pay

## 2021-03-25 ENCOUNTER — Ambulatory Visit (INDEPENDENT_AMBULATORY_CARE_PROVIDER_SITE_OTHER): Payer: Medicare Other

## 2021-03-25 ENCOUNTER — Other Ambulatory Visit: Payer: Self-pay

## 2021-03-25 DIAGNOSIS — R059 Cough, unspecified: Secondary | ICD-10-CM | POA: Diagnosis not present

## 2021-03-25 DIAGNOSIS — R42 Dizziness and giddiness: Secondary | ICD-10-CM

## 2021-03-25 NOTE — Discharge Instructions (Addendum)
Make sure you are drinking plenty of fluids, especially water, and rest as much as possible.    If the dizziness does not improve, follow up with your neurology about the aricept.    You can take Tylenol and/or Ibuprofen as needed for fever reduction and pain relief.   For cough: honey 1/2 to 1 teaspoon (you can dilute the honey in water or another fluid).  You can also use guaifenesin and dextromethorphan for cough. You can use a humidifier for chest congestion and cough.   Return or go to the Emergency Department if symptoms worsen or do not improve in the next few days.

## 2021-03-25 NOTE — ED Triage Notes (Addendum)
Pt reports having "wet cough", dizziness, increased confusion and fatigue x 2 days.  Pt reports she is having difficulty remembering the days. Per family, pt is more confused than usual in the past 2 days. Pt is forgetting how to seat in the toilet or how to move from the wheel chair to the chair.  Pt started taking Aricept today for confusion prescribed by Neurologist.

## 2021-03-25 NOTE — ED Provider Notes (Signed)
[place]    CSN: [id number] Arrival date & time: 03/25/21  1502      History   Chief Complaint Chief Complaint  Patient presents with  . Cough  . Dizziness  . Altered Mental Status    HPI Gina Mcgee is a 87 y.o. female.   Patient here for evaluation productive cough and fatigue that has been ongoing for the past 2 days.  Reports cough has been intermittent for the past several days but has been worsening.  Also reports some increased confusion and dizziness.  Patient was recently started on Aricept for cognitive decline.  Has not taken any OTC medications or treatments.  Denies any trauma, injury, or other precipitating event.  Denies any specific alleviating or aggravating factors.  Denies any fevers, chest pain, shortness of breath, N/V/D, numbness, tingling, weakness, abdominal pain, or headaches.     The history is provided by the patient and a caregiver.  Cough Associated symptoms: no chest pain, no fever and no shortness of breath   Dizziness Associated symptoms: no chest pain and no shortness of breath   Altered Mental Status Associated symptoms: no fever    Past Medical History:  Diagnosis Date  . Brain concussion 1960  . Cancer [facility])    left-sided breast cancer  . 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 HTN (hypertension)   . Osteoporosis     Patient Active Problem List   Diagnosis Date Noted  . Osteoporosis   . Abnormal CXR 01/20/2013  . Preop cardiovascular exam 01/20/2013  . Surgery, elective 01/18/2013  . Fracture of pubic ramus ([place]) 11/21/2011  . Hip pain, acute 11/20/2011  . Fall 11/20/2011  . Hyponatremia 11/20/2011  . Hypokalemia 11/20/2011  . Pelvic fracture ([place]) 11/20/2011  . HTN (hypertension)   . Concussion   . History of breast cancer in female     Past Surgical History:  Procedure Laterality Date  . BOWEL RESECTION  approx. in 2007   polyp and formed stricture  . BREAST LUMPECTOMY  1981   left  lumpectomy w/ radiation  . COLONOSCOPY W/ POLYPECTOMY    . FEMUR SURGERY  2011   left femur pinning  . JOINT REPLACEMENT    . ORIF HUMERUS FRACTURE Left 01/25/2013   Procedure: OPEN REDUCTION INTERNAL FIXATION (ORIF) DISTAL HUMERUS FRACTURE REPAIR RECONSTRUCTION AND ULNA NERVE DECOMPRESSION AND ANTERIOR TRANSPOSITION AS NECESSARY;  Surgeon: Roseanne Kaufman, MD;  Location: [place];  Service: Orthopedics;  Laterality: Left;    OB History   No obstetric history on file.      Home Medications    Prior to Admission medications   Medication Sig Start Date End Date Taking? Authorizing Provider  donepezil (ARICEPT) 10 MG tablet Take 0.5 tab daily for 30 days. Then increase to 1 tab (10 mg) daily. 03/21/21  Yes [provider]  beta carotene w/minerals (OCUVITE) tablet Take 1 tablet by mouth daily.    [provider]  Calcium Carbonate-Vitamin D (CALCIUM 500 + D PO) Take 1 tablet by mouth 2 (two) times daily.    [provider]  cephALEXin (KEFLEX) 500 MG capsule Take 1 capsule (500 mg total) by mouth 2 (two) times daily. 11/17/19   Ripley Fraise, MD  Cholecalciferol (VITAMIN D3) 1000 UNITS CAPS Take by mouth.    [provider]  ELIQUIS 5 MG TABS tablet Take 5 mg by mouth 2 (two) times daily. 03/13/21   [provider]  oxyCODONE (OXY IR/ROXICODONE)  5 MG immediate release tablet Take 1 tablet (5 mg total) by mouth every 4 (four) hours as needed. 01/28/13   Avelina Laine, PA-C  triamterene-hydrochlorothiazide (DYAZIDE) 37.5-25 MG per capsule Take 1 each (1 capsule total) by mouth every morning. 10/13/13   Vernie Shanks, MD    Family History Family History  Problem Relation Age of Onset  . Stroke Mother   . Lung cancer Father   . Stroke Father     Social History Social History   Tobacco Use  . Smoking status: Never  . Smokeless tobacco: Never  Vaping Use  . Vaping Use: Never used  Substance Use Topics  . Alcohol use: Not Currently  . Drug  use: No     Allergies   Aspirin, Fish allergy, Nsaids, Aloe, and Sulfa antibiotics   Review of Systems Review of Systems  Constitutional:  Negative for fever.  Respiratory:  Positive for cough. Negative for chest tightness and shortness of breath.   Cardiovascular:  Negative for chest pain.  Neurological:  Positive for dizziness.  All other systems reviewed and are negative.   Physical Exam Triage Vital Signs ED Triage Vitals  Enc Vitals Group     BP 03/25/21 1606 136/78     Pulse Rate 03/25/21 1606 93     Resp 03/25/21 1606 18     Temp 03/25/21 1606 98.4 F (36.9 C)     Temp Source 03/25/21 1606 Oral     SpO2 03/25/21 1606 93 %     Weight --      Height --      Head Circumference --      Peak Flow --      Pain Score 03/25/21 1600 0     Pain Loc --      Pain Edu? --      Excl. in Standard? --    No data found.  Updated Vital Signs BP 136/78 (BP Location: Right Arm)   Pulse 93   Temp 98.4 F (36.9 C) (Oral)   Resp 18   SpO2 93%   Visual Acuity Right Eye Distance:   Left Eye Distance:   Bilateral Distance:    Right Eye Near:   Left Eye Near:    Bilateral Near:     Physical Exam Vitals and nursing note reviewed.  Constitutional:      General: She is not in acute distress.    Appearance: Normal appearance. She is not ill-appearing, toxic-appearing or diaphoretic.  HENT:     Head: Normocephalic and atraumatic.  Eyes:     Conjunctiva/sclera: Conjunctivae normal.  Cardiovascular:     Rate and Rhythm: Normal rate.     Pulses: Normal pulses.     Heart sounds: Normal heart sounds.  Pulmonary:     Effort: Pulmonary effort is normal. No tachypnea, bradypnea, accessory muscle usage, prolonged expiration or respiratory distress.     Breath sounds: No stridor or decreased air movement. Examination of the right-lower field reveals decreased breath sounds. Examination of the left-lower field reveals decreased breath sounds. Decreased breath sounds present. No wheezing,  rhonchi or rales.  Chest:     Chest wall: No tenderness.  Abdominal:     General: Abdomen is flat.  Musculoskeletal:        General: Normal range of motion.     Cervical back: Normal range of motion.  Skin:    General: Skin is warm and dry.  Neurological:     General: No focal deficit present.  Mental Status: She is alert and oriented to person, place, and time.  Psychiatric:        Mood and Affect: Mood normal.     UC Treatments / Results  Labs (all labs ordered are listed, but only abnormal results are displayed) Labs Reviewed - No data to display  EKG   Radiology DG Chest 2 View  Result Date: 03/25/2021 CLINICAL DATA:  Productive cough EXAM: CHEST - 2 VIEW COMPARISON:  02/10/2020, 11/16/2019 FINDINGS: Low lung volumes. Linear scarring or atelectasis at the left base. No acute consolidation, pleural effusion or pneumothorax. Stable cardiomediastinal silhouette with aortic atherosclerosis. Kyphosis of the spine with multiple chronic compression fractures. IMPRESSION: No active cardiopulmonary disease. Low lung volumes with atelectasis or scar at the left base. Electronically Signed   By: Donavan Foil M.D.   On: 03/25/2021 16:50    Procedures Procedures (including critical care time)  Medications Ordered in UC Medications - No data to display  Initial Impression / Assessment and Plan / UC Course  I have reviewed the triage vital signs and the nursing notes.  Pertinent labs & imaging results that were available during my care of the patient were reviewed by me and considered in my medical decision making (see chart for details).    Assessment negative for red flags or concerns.  X-ray with no active cardiopulmonary disease.  Encourage fluids and rest.  Take OTC cough medications as needed for cough.  Discussed conservative symptom management as described in discharge instructions.  It is very possible that the dizziness is caused by the Aricept.  Recommend continuing  Aricept and follow-up with neurologist if symptoms do not improve.  May take Tylenol and/or ibuprofen as needed for pain and fever.  Follow up with primary care for re-evaluation.   Final Clinical Impressions(s) / UC Diagnoses   Final diagnoses:  Cough  Dizziness     Discharge Instructions      Make sure you are drinking plenty of fluids, especially water, and rest as much as possible.    If the dizziness does not improve, follow up with your neurology about the aricept.    You can take Tylenol and/or Ibuprofen as needed for fever reduction and pain relief.   For cough: honey 1/2 to 1 teaspoon (you can dilute the honey in water or another fluid).  You can also use guaifenesin and dextromethorphan for cough. You can use a humidifier for chest congestion and cough.   Return or go to the Emergency Department if symptoms worsen or do not improve in the next few days.      ED Prescriptions   None    PDMP not reviewed this encounter.   Pearson Forster, NP 03/25/21 1734

## 2021-04-02 ENCOUNTER — Other Ambulatory Visit (HOSPITAL_COMMUNITY): Payer: Self-pay | Admitting: Family Medicine

## 2021-04-02 DIAGNOSIS — R2232 Localized swelling, mass and lump, left upper limb: Secondary | ICD-10-CM | POA: Diagnosis not present

## 2021-04-02 DIAGNOSIS — I10 Essential (primary) hypertension: Secondary | ICD-10-CM | POA: Diagnosis not present

## 2021-04-02 DIAGNOSIS — E538 Deficiency of other specified B group vitamins: Secondary | ICD-10-CM | POA: Diagnosis not present

## 2021-04-02 DIAGNOSIS — D510 Vitamin B12 deficiency anemia due to intrinsic factor deficiency: Secondary | ICD-10-CM | POA: Diagnosis not present

## 2021-04-02 DIAGNOSIS — R053 Chronic cough: Secondary | ICD-10-CM

## 2021-04-02 DIAGNOSIS — Z8349 Family history of other endocrine, nutritional and metabolic diseases: Secondary | ICD-10-CM | POA: Diagnosis not present

## 2021-04-02 DIAGNOSIS — M40209 Unspecified kyphosis, site unspecified: Secondary | ICD-10-CM | POA: Diagnosis not present

## 2021-04-02 DIAGNOSIS — G540 Brachial plexus disorders: Secondary | ICD-10-CM | POA: Diagnosis not present

## 2021-04-02 DIAGNOSIS — E559 Vitamin D deficiency, unspecified: Secondary | ICD-10-CM | POA: Diagnosis not present

## 2021-04-02 DIAGNOSIS — R4189 Other symptoms and signs involving cognitive functions and awareness: Secondary | ICD-10-CM | POA: Diagnosis not present

## 2021-04-02 DIAGNOSIS — C679 Malignant neoplasm of bladder, unspecified: Secondary | ICD-10-CM | POA: Diagnosis not present

## 2021-04-04 ENCOUNTER — Ambulatory Visit
Admission: RE | Admit: 2021-04-04 | Discharge: 2021-04-04 | Disposition: A | Discharge status: Home / Self Care | Payer: Medicare Other | Source: Ambulatory Visit | Attending: Family Medicine | Admitting: Family Medicine

## 2021-04-04 ENCOUNTER — Other Ambulatory Visit: Payer: Self-pay | Admitting: Family Medicine

## 2021-04-04 ENCOUNTER — Other Ambulatory Visit: Payer: Self-pay

## 2021-04-04 DIAGNOSIS — R609 Edema, unspecified: Secondary | ICD-10-CM

## 2021-04-04 DIAGNOSIS — W19XXXA Unspecified fall, initial encounter: Secondary | ICD-10-CM

## 2021-04-04 DIAGNOSIS — M7989 Other specified soft tissue disorders: Secondary | ICD-10-CM | POA: Diagnosis not present

## 2021-04-04 DIAGNOSIS — S59902A Unspecified injury of left elbow, initial encounter: Secondary | ICD-10-CM | POA: Diagnosis not present

## 2021-04-11 ENCOUNTER — Ambulatory Visit (HOSPITAL_COMMUNITY)
Admission: RE | Admit: 2021-04-11 | Discharge: 2021-04-11 | Disposition: A | Discharge status: Home / Self Care | Payer: Medicare Other | Source: Ambulatory Visit | Attending: Family Medicine | Admitting: Family Medicine

## 2021-04-11 ENCOUNTER — Other Ambulatory Visit: Payer: Self-pay

## 2021-04-11 ENCOUNTER — Other Ambulatory Visit (HOSPITAL_COMMUNITY): Payer: Self-pay | Admitting: Family Medicine

## 2021-04-11 DIAGNOSIS — R053 Chronic cough: Secondary | ICD-10-CM

## 2021-04-11 DIAGNOSIS — R059 Cough, unspecified: Secondary | ICD-10-CM | POA: Diagnosis not present

## 2021-04-11 DIAGNOSIS — K224 Dyskinesia of esophagus: Secondary | ICD-10-CM | POA: Diagnosis not present

## 2021-05-02 DIAGNOSIS — D510 Vitamin B12 deficiency anemia due to intrinsic factor deficiency: Secondary | ICD-10-CM | POA: Diagnosis not present

## 2021-05-08 DIAGNOSIS — C672 Malignant neoplasm of lateral wall of bladder: Secondary | ICD-10-CM | POA: Diagnosis not present

## 2021-05-08 DIAGNOSIS — C679 Malignant neoplasm of bladder, unspecified: Secondary | ICD-10-CM | POA: Diagnosis not present

## 2021-05-15 DIAGNOSIS — G5622 Lesion of ulnar nerve, left upper limb: Secondary | ICD-10-CM | POA: Diagnosis not present

## 2021-05-25 DIAGNOSIS — Z23 Encounter for immunization: Secondary | ICD-10-CM | POA: Diagnosis not present

## 2021-05-28 DIAGNOSIS — K449 Diaphragmatic hernia without obstruction or gangrene: Secondary | ICD-10-CM | POA: Diagnosis not present

## 2021-05-28 DIAGNOSIS — E538 Deficiency of other specified B group vitamins: Secondary | ICD-10-CM | POA: Diagnosis not present

## 2021-05-28 DIAGNOSIS — Z8349 Family history of other endocrine, nutritional and metabolic diseases: Secondary | ICD-10-CM | POA: Diagnosis not present

## 2021-05-28 DIAGNOSIS — E559 Vitamin D deficiency, unspecified: Secondary | ICD-10-CM | POA: Diagnosis not present

## 2021-05-28 DIAGNOSIS — I10 Essential (primary) hypertension: Secondary | ICD-10-CM | POA: Diagnosis not present

## 2021-05-28 DIAGNOSIS — M40209 Unspecified kyphosis, site unspecified: Secondary | ICD-10-CM | POA: Diagnosis not present

## 2021-05-28 DIAGNOSIS — C679 Malignant neoplasm of bladder, unspecified: Secondary | ICD-10-CM | POA: Diagnosis not present

## 2021-05-28 DIAGNOSIS — G540 Brachial plexus disorders: Secondary | ICD-10-CM | POA: Diagnosis not present

## 2021-05-28 DIAGNOSIS — R4189 Other symptoms and signs involving cognitive functions and awareness: Secondary | ICD-10-CM | POA: Diagnosis not present

## 2021-05-28 DIAGNOSIS — R2232 Localized swelling, mass and lump, left upper limb: Secondary | ICD-10-CM | POA: Diagnosis not present

## 2021-05-28 DIAGNOSIS — D510 Vitamin B12 deficiency anemia due to intrinsic factor deficiency: Secondary | ICD-10-CM | POA: Diagnosis not present

## 2021-05-28 DIAGNOSIS — R053 Chronic cough: Secondary | ICD-10-CM | POA: Diagnosis not present

## 2021-06-03 DIAGNOSIS — N340 Urethral abscess: Secondary | ICD-10-CM | POA: Diagnosis not present

## 2021-06-03 DIAGNOSIS — N8110 Cystocele, unspecified: Secondary | ICD-10-CM | POA: Diagnosis not present

## 2021-06-03 DIAGNOSIS — C679 Malignant neoplasm of bladder, unspecified: Secondary | ICD-10-CM | POA: Diagnosis not present

## 2021-06-03 DIAGNOSIS — R9431 Abnormal electrocardiogram [ECG] [EKG]: Secondary | ICD-10-CM | POA: Diagnosis not present

## 2021-06-04 DIAGNOSIS — C679 Malignant neoplasm of bladder, unspecified: Secondary | ICD-10-CM | POA: Diagnosis not present

## 2021-06-04 DIAGNOSIS — N8110 Cystocele, unspecified: Secondary | ICD-10-CM | POA: Diagnosis not present

## 2021-06-04 DIAGNOSIS — N340 Urethral abscess: Secondary | ICD-10-CM | POA: Diagnosis not present

## 2021-06-25 DIAGNOSIS — H26491 Other secondary cataract, right eye: Secondary | ICD-10-CM | POA: Diagnosis not present

## 2021-06-25 DIAGNOSIS — H353131 Nonexudative age-related macular degeneration, bilateral, early dry stage: Secondary | ICD-10-CM | POA: Diagnosis not present

## 2021-06-25 DIAGNOSIS — H52203 Unspecified astigmatism, bilateral: Secondary | ICD-10-CM | POA: Diagnosis not present

## 2021-06-25 DIAGNOSIS — H532 Diplopia: Secondary | ICD-10-CM | POA: Diagnosis not present

## 2021-06-27 DIAGNOSIS — D510 Vitamin B12 deficiency anemia due to intrinsic factor deficiency: Secondary | ICD-10-CM | POA: Diagnosis not present

## 2021-07-02 DIAGNOSIS — D1801 Hemangioma of skin and subcutaneous tissue: Secondary | ICD-10-CM | POA: Diagnosis not present

## 2021-07-02 DIAGNOSIS — L820 Inflamed seborrheic keratosis: Secondary | ICD-10-CM | POA: Diagnosis not present

## 2021-07-02 DIAGNOSIS — L821 Other seborrheic keratosis: Secondary | ICD-10-CM | POA: Diagnosis not present

## 2021-07-02 DIAGNOSIS — Z85828 Personal history of other malignant neoplasm of skin: Secondary | ICD-10-CM | POA: Diagnosis not present

## 2021-07-15 DIAGNOSIS — R531 Weakness: Secondary | ICD-10-CM | POA: Diagnosis not present

## 2021-07-15 DIAGNOSIS — Z9181 History of falling: Secondary | ICD-10-CM | POA: Diagnosis not present

## 2021-07-15 DIAGNOSIS — Z7409 Other reduced mobility: Secondary | ICD-10-CM | POA: Diagnosis not present

## 2021-07-17 DIAGNOSIS — H26491 Other secondary cataract, right eye: Secondary | ICD-10-CM | POA: Diagnosis not present

## 2021-07-22 DIAGNOSIS — C672 Malignant neoplasm of lateral wall of bladder: Secondary | ICD-10-CM | POA: Diagnosis not present

## 2021-08-01 DIAGNOSIS — C672 Malignant neoplasm of lateral wall of bladder: Secondary | ICD-10-CM | POA: Diagnosis not present

## 2021-08-01 DIAGNOSIS — R32 Unspecified urinary incontinence: Secondary | ICD-10-CM | POA: Diagnosis not present

## 2021-08-08 DIAGNOSIS — C672 Malignant neoplasm of lateral wall of bladder: Secondary | ICD-10-CM | POA: Diagnosis not present

## 2021-08-22 IMAGING — DX DG CHEST 2V
2 series · 2 of 2 positions shown · non-contrast
Comparison: 02/10/2020, 11/16/2019

CLINICAL DATA: Productive cough

EXAM:
CHEST - 2 VIEW

[chest ap]
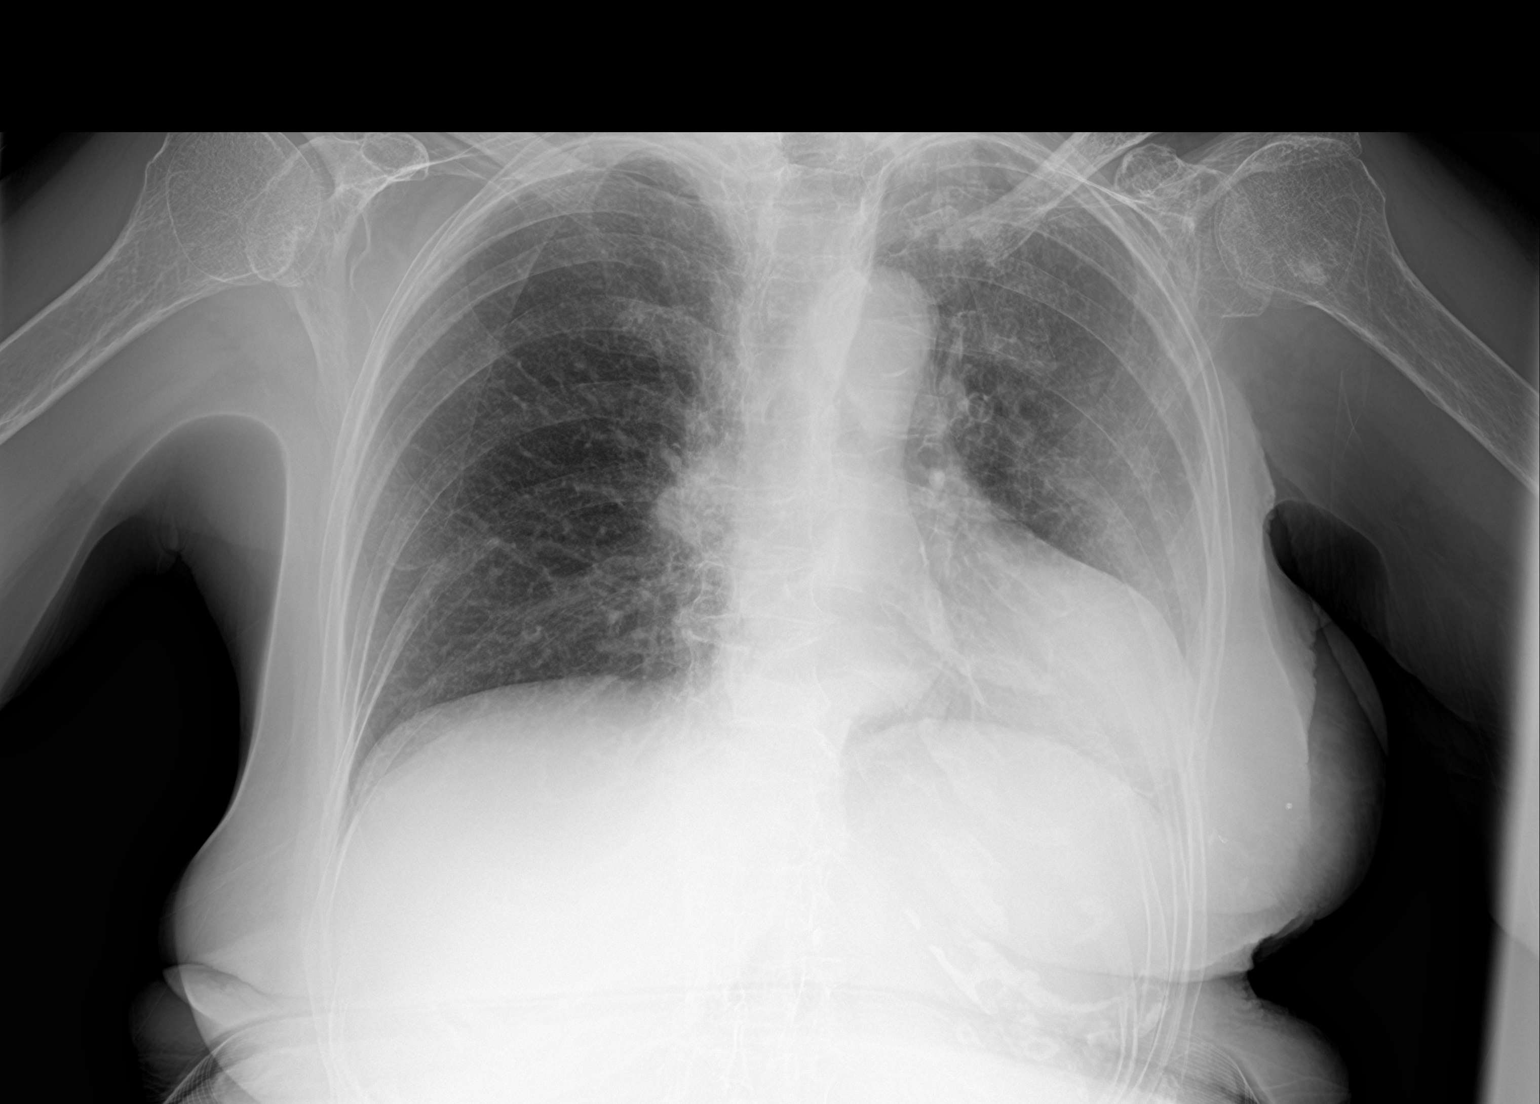

[chest lat]
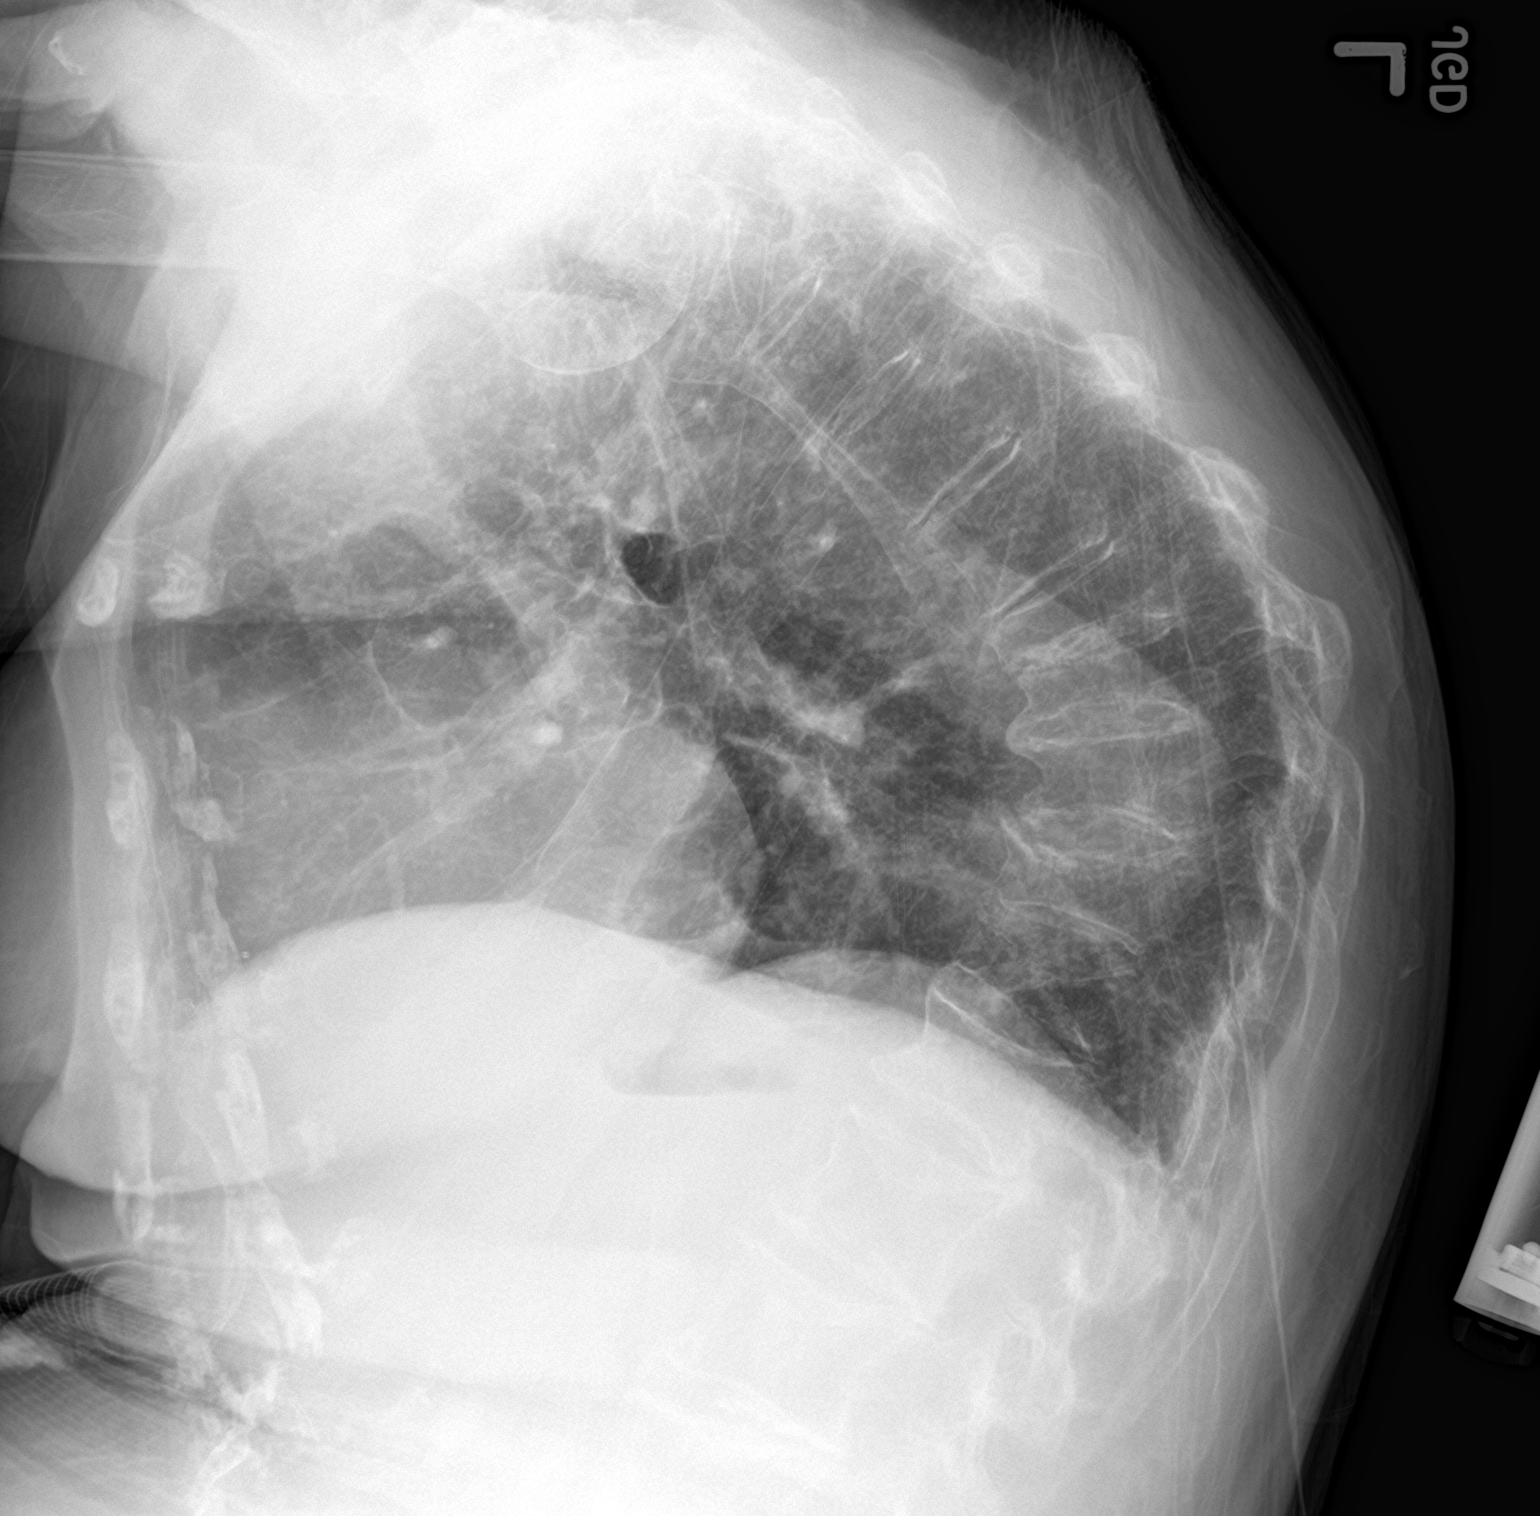

[2 of 2 positions shown; findings below may reference images not displayed]

FINDINGS: Low lung volumes. Linear scarring or atelectasis at the left base.
No acute consolidation, pleural effusion or pneumothorax. Stable
cardiomediastinal silhouette with aortic atherosclerosis. Kyphosis
of the spine with multiple chronic compression fractures.
IMPRESSION: No active cardiopulmonary disease. Low lung volumes with atelectasis
or scar at the left base.

## 2021-08-29 DIAGNOSIS — D510 Vitamin B12 deficiency anemia due to intrinsic factor deficiency: Secondary | ICD-10-CM | POA: Diagnosis not present

## 2021-08-29 DIAGNOSIS — C679 Malignant neoplasm of bladder, unspecified: Secondary | ICD-10-CM | POA: Diagnosis not present

## 2021-08-29 DIAGNOSIS — R053 Chronic cough: Secondary | ICD-10-CM | POA: Diagnosis not present

## 2021-08-29 DIAGNOSIS — I10 Essential (primary) hypertension: Secondary | ICD-10-CM | POA: Diagnosis not present

## 2021-08-29 DIAGNOSIS — E538 Deficiency of other specified B group vitamins: Secondary | ICD-10-CM | POA: Diagnosis not present

## 2021-08-29 DIAGNOSIS — Z8349 Family history of other endocrine, nutritional and metabolic diseases: Secondary | ICD-10-CM | POA: Diagnosis not present

## 2021-08-29 DIAGNOSIS — E559 Vitamin D deficiency, unspecified: Secondary | ICD-10-CM | POA: Diagnosis not present

## 2021-08-29 DIAGNOSIS — R2232 Localized swelling, mass and lump, left upper limb: Secondary | ICD-10-CM | POA: Diagnosis not present

## 2021-08-29 DIAGNOSIS — G540 Brachial plexus disorders: Secondary | ICD-10-CM | POA: Diagnosis not present

## 2021-08-29 DIAGNOSIS — K449 Diaphragmatic hernia without obstruction or gangrene: Secondary | ICD-10-CM | POA: Diagnosis not present

## 2021-08-29 DIAGNOSIS — R4189 Other symptoms and signs involving cognitive functions and awareness: Secondary | ICD-10-CM | POA: Diagnosis not present

## 2021-08-29 DIAGNOSIS — M40209 Unspecified kyphosis, site unspecified: Secondary | ICD-10-CM | POA: Diagnosis not present

## 2021-09-08 IMAGING — RF DG ESOPHAGUS
5 series · 11 of 11 positions shown · non-contrast
Comparison: None.

CLINICAL DATA: Chronic cough, heartburn. History of radiation for
breast cancer on left. Rule out esophageal stricture.

EXAM:
ESOPHOGRAM/BARIUM SWALLOW
TECHNIQUE: Single contrast examination was performed using thin barium and
barium tablet.
FLUOROSCOPY TIME:  Fluoroscopy Time:  1 minutes 42 second
Radiation Exposure Index (if provided by the fluoroscopic device):
Number of Acquired Spot Images: 5

[Series 1: cp_standard · 0.38mm/px · 4 of 95 frames shown (1 of 5)]
[frame 15/95]
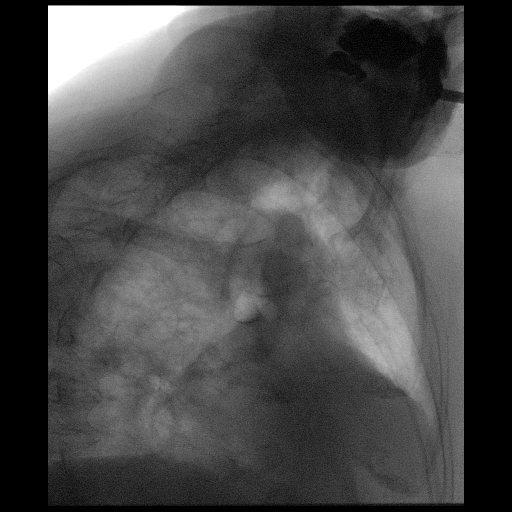
[frame 48/95]
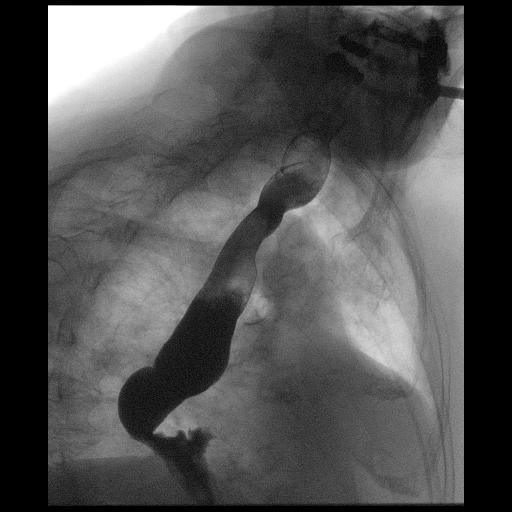
[frame 58/95]
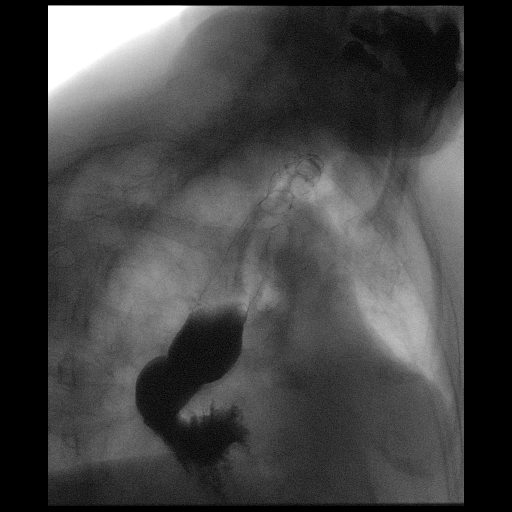
[frame 81/95]
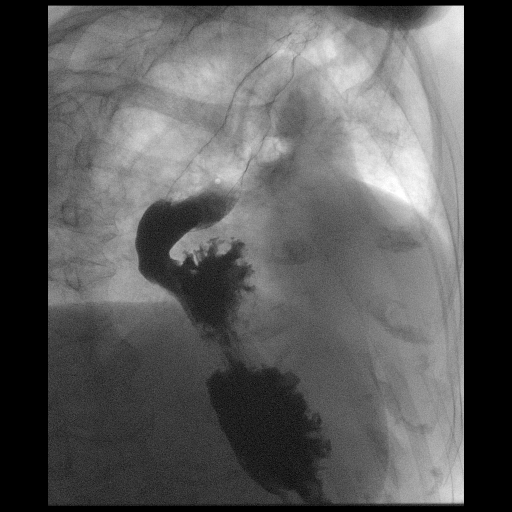

[Series 2: cp_standard · 0.37mm/px · 4 of 75 frames shown (2 of 5)]
[frame 12/75]
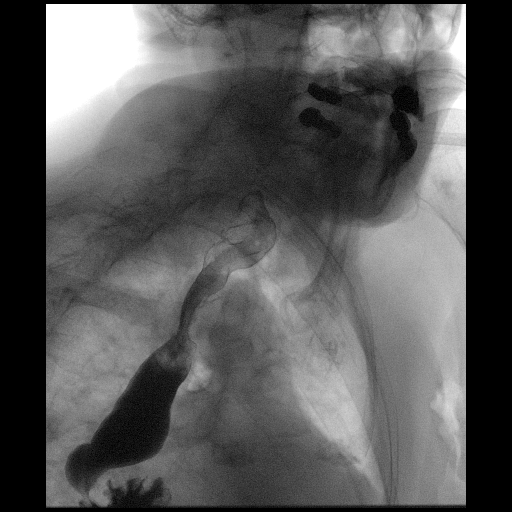
[frame 38/75]
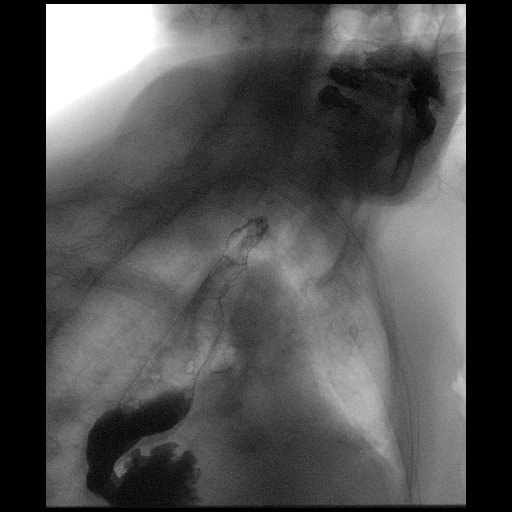
[frame 44/75]
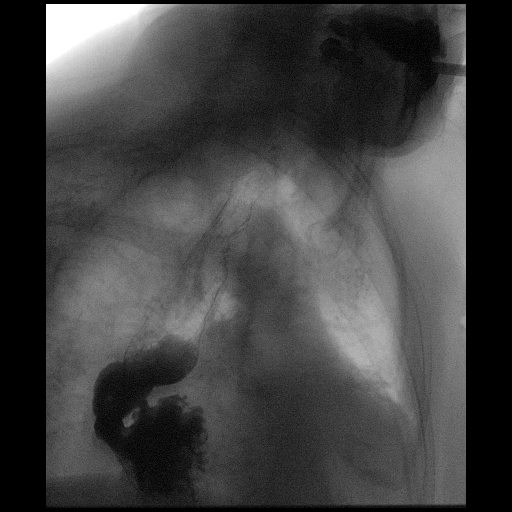
[frame 64/75]
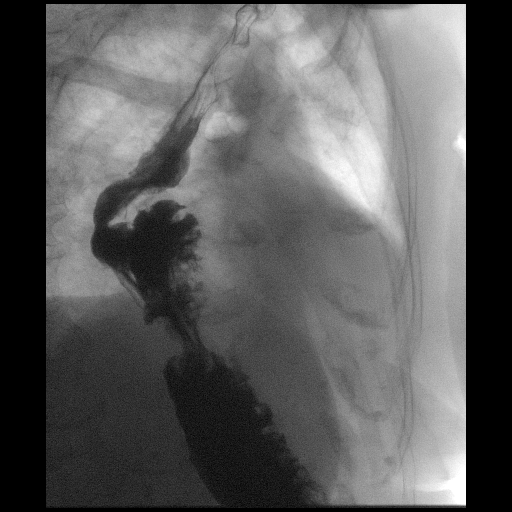

[Series 3: cp_standard · 0.19mm/px · 1 of 1 slices shown (3 of 5)]
[im 1/1]
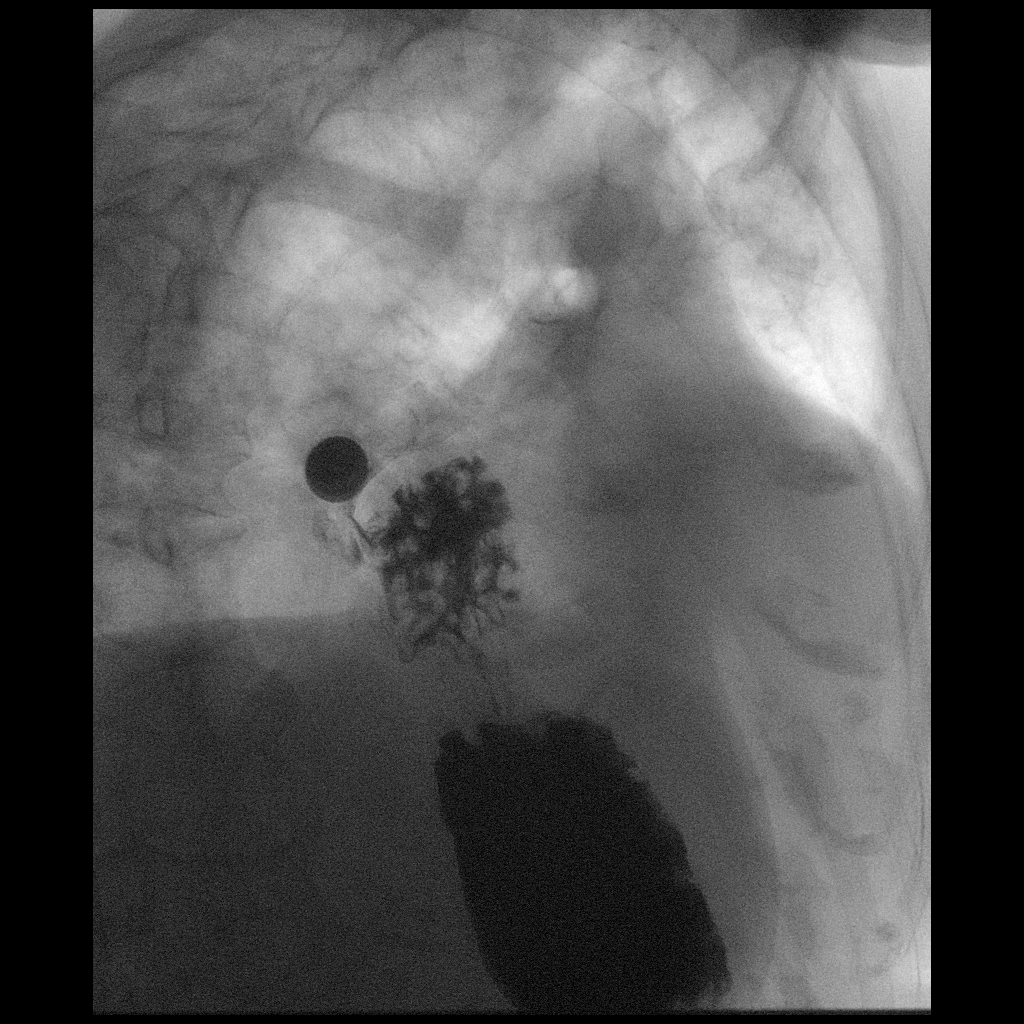

[Series 4: cp_standard · 0.18mm/px · 1 of 1 slices shown (4 of 5)]
[im 1/1]
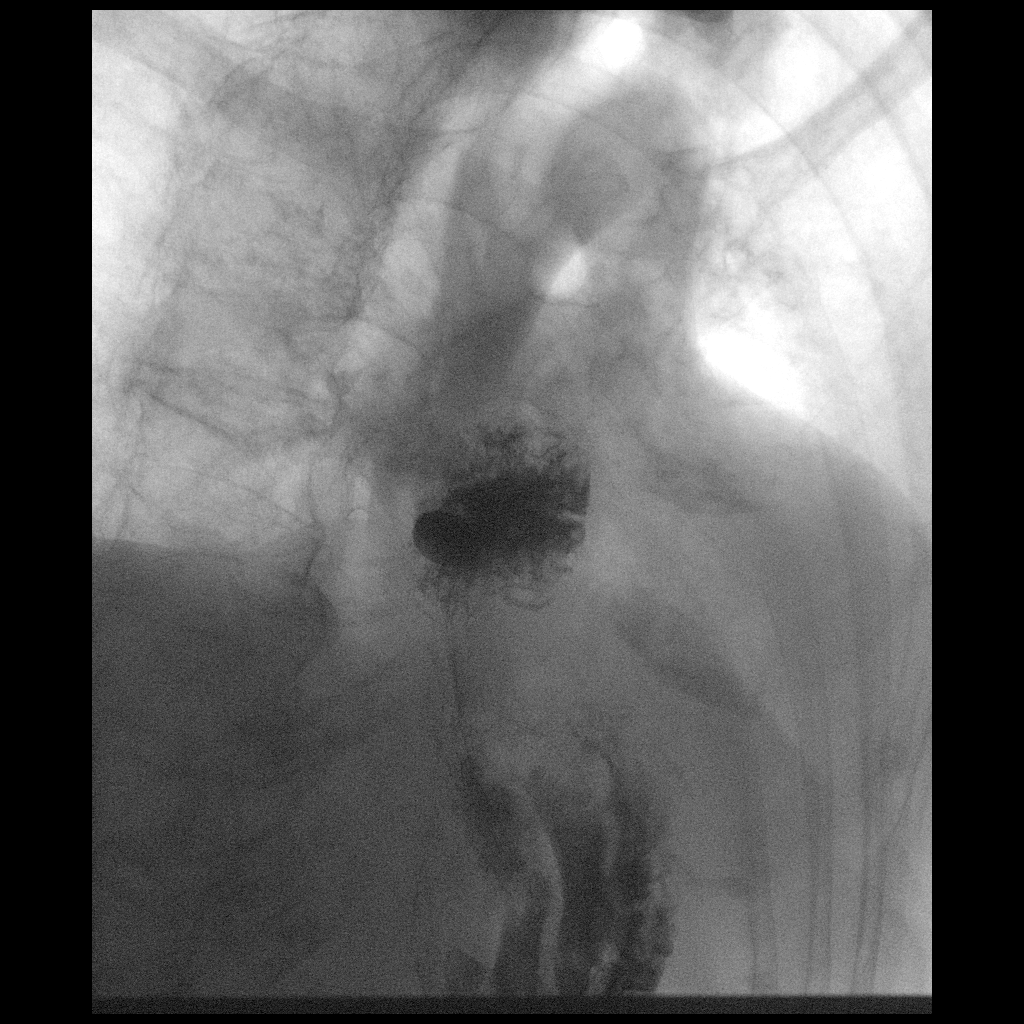

[Series 5: cp_standard · 0.18mm/px · 1 of 1 slices shown (5 of 5)]
[im 1/1]
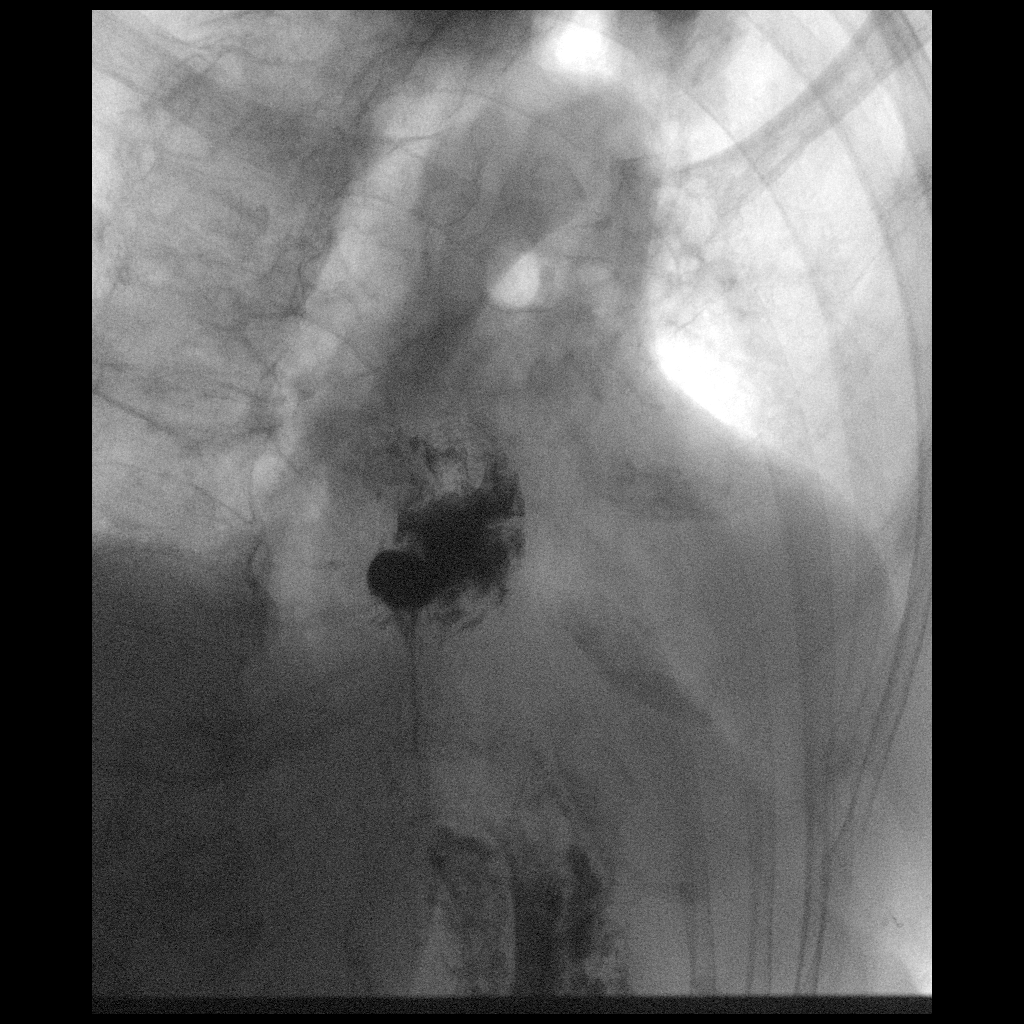

[11 of 11 positions shown; findings below may reference images not displayed]

FINDINGS: Examination limited by limited patient mobility.

The patient swallowed thin barium. The esophagus is mildly dilated
with decreased peristalsis. No stricture or mass. There is a
moderately large hiatal hernia. No esophageal ulcer or diverticulum.

Barium tablet passed readily into the distal esophagus and then into
the hiatal hernia.
IMPRESSION: Decreased esophageal motility.  Negative for stricture

Moderate hiatal hernia.

## 2021-09-17 DIAGNOSIS — M810 Age-related osteoporosis without current pathological fracture: Secondary | ICD-10-CM | POA: Diagnosis not present

## 2021-09-17 DIAGNOSIS — I10 Essential (primary) hypertension: Secondary | ICD-10-CM | POA: Diagnosis not present

## 2021-09-17 DIAGNOSIS — D510 Vitamin B12 deficiency anemia due to intrinsic factor deficiency: Secondary | ICD-10-CM | POA: Diagnosis not present

## 2021-09-17 DIAGNOSIS — K219 Gastro-esophageal reflux disease without esophagitis: Secondary | ICD-10-CM | POA: Diagnosis not present

## 2021-10-03 DIAGNOSIS — E559 Vitamin D deficiency, unspecified: Secondary | ICD-10-CM | POA: Diagnosis not present

## 2021-10-03 DIAGNOSIS — R5383 Other fatigue: Secondary | ICD-10-CM | POA: Diagnosis not present

## 2021-10-03 DIAGNOSIS — R0602 Shortness of breath: Secondary | ICD-10-CM | POA: Diagnosis not present

## 2021-10-07 DIAGNOSIS — Z23 Encounter for immunization: Secondary | ICD-10-CM | POA: Diagnosis not present

## 2021-10-11 DIAGNOSIS — D510 Vitamin B12 deficiency anemia due to intrinsic factor deficiency: Secondary | ICD-10-CM | POA: Diagnosis not present

## 2021-10-11 DIAGNOSIS — K219 Gastro-esophageal reflux disease without esophagitis: Secondary | ICD-10-CM | POA: Diagnosis not present

## 2021-10-11 DIAGNOSIS — I10 Essential (primary) hypertension: Secondary | ICD-10-CM | POA: Diagnosis not present

## 2021-10-11 DIAGNOSIS — M810 Age-related osteoporosis without current pathological fracture: Secondary | ICD-10-CM | POA: Diagnosis not present

## 2021-10-22 ENCOUNTER — Other Ambulatory Visit: Payer: Self-pay

## 2021-10-22 ENCOUNTER — Ambulatory Visit (INDEPENDENT_AMBULATORY_CARE_PROVIDER_SITE_OTHER): Payer: Medicare Other | Admitting: Internal Medicine

## 2021-10-22 VITALS — BP 110/68 | HR 92 | Ht 60.0 in

## 2021-10-22 DIAGNOSIS — I4891 Unspecified atrial fibrillation: Secondary | ICD-10-CM

## 2021-10-22 MED ORDER — METOPROLOL SUCCINATE ER 25 MG PO TB24: 25.0000 mg | ORAL_TABLET | Freq: Every day | ORAL | 3 refills | Status: DC

## 2021-10-22 NOTE — Patient Instructions (Signed)
Medication Instructions:  START: METOPROLOL SUCCINATE 25mg  ONCE DAILY  *If you need a refill on your cardiac medications before your next appointment, please call your pharmacy*  Testing/Procedures: Your physician has requested that you have an echocardiogram. Echocardiography is a painless test that uses sound waves to create images of your heart. It provides your doctor with information about the size and shape of your heart and how well your hearts chambers and valves are working. You may receive an ultrasound enhancing agent through an IV if needed to better visualize your heart during the echo.This procedure takes approximately one hour. There are no restrictions for this procedure. This will take place at the [address], [address].   Follow-Up: At [facility], you and your health needs are our priority.  As part of our continuing mission to provide you with exceptional heart care, we have created designated Provider Care Teams.  These Care Teams include your primary Cardiologist (physician) and Advanced Practice Providers (APPs -  Physician Assistants and Nurse Practitioners) who all work together to provide you with the care you need, when you need it.  Your next appointment:   3 month(s)  The format for your next appointment:   In Person  Provider:   Janina Mayo, MD

## 2021-10-22 NOTE — Progress Notes (Signed)
Cardiology Office Note:    Date:  10/22/2021   ID:  Gina Mcgee, DOB September 03, 1930, MRN [id number]  PCP:  Vernie Shanks, MD   [facility] Providers Cardiologist:  Janina Mayo, MD     Referring MD: Orvan July, NP   Chief Complaint  Patient presents with   New Patient (Initial Visit)  Low energy  History of Present Illness:    Gina Mcgee is a 88 y.o. female with a hx of HTN, basal cell carcinoma 06/2020, left sided breast cancer,  brachial plexus neuropathy, referral to see if a "medication can help the activities of her heart", low energy  Her daughter notes that she skips heart beats. She feels dizzy. This has been for the last year. This has been going on for the last 3 months.  She is asymptomatic. She feels like she's floating. Her EKG shows rate controlled atrial fibrillation. Atrial fibrillation is a new diagnosis. She has some chest discomfort and diagnosed with hiatal hernia. 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 This was [facility] at [facility].  She has no known other cardiac hx. No stress tests. She does not recall echo, a cardiac scan is noted in care everywhere with no results.  She's had bladder cancer s/p treatment with intravesical BCG. She had lumpectomy/XRT for breast cancer in 1980s.TSH 1.7. No snoring. No hx of bleeding. She has had balance issues in the past with fractures.  Crt 0.8 93 kg   Past Medical History:  Diagnosis Date   Brain concussion 1960   Cancer [facility])    left-sided breast cancer   Concussion 1960   with left-sided neuro defecits   HTN (hypertension)    Osteoporosis     Past Surgical History:  Procedure Laterality Date   BOWEL RESECTION  approx. in 2007   polyp and formed stricture   BREAST LUMPECTOMY  1981   left lumpectomy w/ radiation   COLONOSCOPY W/ POLYPECTOMY     FEMUR SURGERY  2011   left femur pinning   JOINT REPLACEMENT     ORIF HUMERUS FRACTURE Left 01/25/2013   Procedure: OPEN  REDUCTION INTERNAL FIXATION (ORIF) DISTAL HUMERUS FRACTURE REPAIR RECONSTRUCTION AND ULNA NERVE DECOMPRESSION AND ANTERIOR TRANSPOSITION AS NECESSARY;  Surgeon: Roseanne Kaufman, MD;  Location: [place];  Service: Orthopedics;  Laterality: Left;    Current Medications: Current Meds  Medication Sig   beta carotene w/minerals (OCUVITE) tablet Take 1 tablet by mouth daily.   Calcium Carbonate-Vitamin D (CALCIUM 500 + D PO) Take 1 tablet by mouth 2 (two) times daily.   Cholecalciferol (VITAMIN D3) 1000 UNITS CAPS Take by mouth.   ELIQUIS 5 MG TABS tablet Take 5 mg by mouth 2 (two) times daily.   hydrochlorothiazide (HYDRODIURIL) 12.5 MG tablet Take 12.5 mg by mouth daily.   memantine (NAMENDA) 5 MG tablet Take 5 mg by mouth daily.   metoprolol succinate (TOPROL XL) 25 MG 24 hr tablet Take 1 tablet (25 mg total) by mouth daily.   [DISCONTINUED] cephALEXin (KEFLEX) 500 MG capsule Take 1 capsule (500 mg total) by mouth 2 (two) times daily.   [DISCONTINUED] donepezil (ARICEPT) 10 MG tablet Take 0.5 tab daily for 30 days. Then increase to 1 tab (10 mg) daily.   [DISCONTINUED] oxyCODONE (OXY IR/ROXICODONE) 5 MG immediate release tablet Take 1 tablet (5 mg total) by mouth every 4 (four) hours as needed.   [DISCONTINUED] triamterene-hydrochlorothiazide (DYAZIDE) 37.5-25 MG per capsule Take 1 each (  1 capsule total) by mouth every morning.   Current Facility-Administered Medications for the 10/22/21 encounter (Office Visit) with Janina Mayo, MD  Medication   pneumococcal 13-valent conjugate vaccine (PREVNAR 13) injection 0.5 mL     Allergies:   Aspirin, Doxycycline, Fish allergy, Nsaids, Aloe, and Sulfa antibiotics   Social History   Socioeconomic History   Marital status: Widowed    Spouse name: Not on file   Number of children: Not on file   Years of education: Not on file   Highest education level: Not on file  Occupational History   Not on file  Tobacco Use   Smoking status: Never   Smokeless  tobacco: Never  Vaping Use   Vaping Use: Never used  Substance and Sexual Activity   Alcohol use: Not Currently   Drug use: No   Sexual activity: Not on file  Other Topics Concern   Not on file  Social History Narrative   Not on file   Social Determinants of Health   Financial Resource Strain: Not on file  Food Insecurity: Not on file  Transportation Needs: Not on file  Physical Activity: Not on file  Stress: Not on file  Social Connections: Not on file     Family History: The patient's family history includes Lung cancer in her father; Stroke in her father and mother.  ROS:   Please see the history of present illness.     All other systems reviewed and are negative.  EKGs/Labs/Other Studies Reviewed:    The following studies were reviewed today:   EKG:  EKG is  ordered today.  The ekg ordered today demonstrates   Atrial fibrillation rate 90s  Recent Labs: No results found for requested labs within last 8760 hours.  Recent Lipid Panel No results found for: CHOL, TRIG, HDL, CHOLHDL, VLDL, LDLCALC, LDLDIRECT   Risk Assessment/Calculations:    CHA2DS2-VASc Score = 4   This indicates a 4.8% annual risk of stroke. The patient's score is based upon: CHF History: 0 HTN History: 1 Diabetes History: 0 Stroke History: 0 Vascular Disease History: 0 Age Score: 2 Gender Score: 1          Physical Exam:    VS:    Vitals:   10/22/21 0848  BP: 110/68  Pulse: 92     Wt Readings from Last 3 Encounters:  11/16/19 205 lb (93 kg)  10/13/13 107 lb 12.8 oz (48.9 kg)  01/26/13 108 lb (49 kg)     GEN:  Well nourished, frail sitting in a wheel chair HEENT: Normal NECK: No JVD; No carotid bruits LYMPHATICS: No lymphadenopathy CARDIAC: irregular rate, no murmurs, rubs, gallops RESPIRATORY:  Clear to auscultation without rales, wheezing or rhonchi  ABDOMEN: Soft, non-tender, non-distended MUSCULOSKELETAL:  No edema; No deformity  SKIN: Warm and  dry NEUROLOGIC:  Alert and oriented x 3; left sided weakness in the UE PSYCHIATRIC:  Normal affect   ASSESSMENT:    #Atrial Fibrillation:  Chads2vasc 4. New onset. Paroxysmal. She has not hx of thyroid, liver or lung dx.  No hx of sleep apnea. She has no known cardiac disease. We discussed plan for rate control for now. Can consider DCCV/amiodarone if she has progressive symptoms or develops heart failure. She's maintained on eliquis. She has had falls but no significant bleeds on eliquis, will continue for now. If develops renal dysfunction or weight drops will need to reduce the dose. -continue eliquis 5 mg BID { > 80, normal crt clearance, >  80 kg] -continue metoprolol 25 mg XL  -TTE  #CardioOnc: Cardiotoxicity from radiation can present up to 10-20 years later. Risk is increased with Gy >=15 (advances over the last 30 years have reduced the mean heart dose). This risk includes atherosclerosis and valvular fibrosis. Also limitations with surgery of the mediastinum was exposed.  Alroy Dust et al. Jacc Cardio Onc 2021, Jonette Eva et al. Margaretha Sheffield Cardio Onc 2020  -TTE   PLAN:    In order of problems listed above:  TTE Start metoprolol 25 mg XL Follow up in 3 months        Medication Adjustments/Labs and Tests Ordered: Current medicines are reviewed at length with the patient today.  Concerns regarding medicines are outlined above.  Orders Placed This Encounter  Procedures   EKG 12-Lead   ECHOCARDIOGRAM COMPLETE   Meds ordered this encounter  Medications   metoprolol succinate (TOPROL XL) 25 MG 24 hr tablet    Sig: Take 1 tablet (25 mg total) by mouth daily.    Dispense:  30 tablet    Refill:  3    Patient Instructions  Medication Instructions:  START: METOPROLOL SUCCINATE 25mg  ONCE DAILY  *If you need a refill on your cardiac medications before your next appointment, please call your pharmacy*  Testing/Procedures: Your physician has requested that you have an echocardiogram.  Echocardiography is a painless test that uses sound waves to create images of your heart. It provides your doctor with information about the size and shape of your heart and how well your hearts chambers and valves are working. You may receive an ultrasound enhancing agent through an IV if needed to better visualize your heart during the echo.This procedure takes approximately one hour. There are no restrictions for this procedure. This will take place at the [address], [address].   Follow-Up: At [facility], you and your health needs are our priority.  As part of our continuing mission to provide you with exceptional heart care, we have created designated Provider Care Teams.  These Care Teams include your primary Cardiologist (physician) and Advanced Practice Providers (APPs -  Physician Assistants and Nurse Practitioners) who all work together to provide you with the care you need, when you need it.  Your next appointment:   3 month(s)  The format for your next appointment:   In Person  Provider:   Janina Mayo, MD     Signed, Janina Mayo, MD  10/22/2021 9:24 AM    [place]

## 2021-10-24 ENCOUNTER — Ambulatory Visit: Payer: Medicare Other | Admitting: Cardiovascular Disease

## 2021-10-31 DIAGNOSIS — R4189 Other symptoms and signs involving cognitive functions and awareness: Secondary | ICD-10-CM | POA: Diagnosis not present

## 2021-10-31 DIAGNOSIS — G08 Intracranial and intraspinal phlebitis and thrombophlebitis: Secondary | ICD-10-CM | POA: Diagnosis not present

## 2021-10-31 DIAGNOSIS — Z7901 Long term (current) use of anticoagulants: Secondary | ICD-10-CM | POA: Diagnosis not present

## 2021-10-31 DIAGNOSIS — I4891 Unspecified atrial fibrillation: Secondary | ICD-10-CM | POA: Diagnosis not present

## 2021-10-31 DIAGNOSIS — G3184 Mild cognitive impairment, so stated: Secondary | ICD-10-CM | POA: Diagnosis not present

## 2021-11-01 DIAGNOSIS — L03031 Cellulitis of right toe: Secondary | ICD-10-CM | POA: Diagnosis not present

## 2021-11-01 DIAGNOSIS — E538 Deficiency of other specified B group vitamins: Secondary | ICD-10-CM | POA: Diagnosis not present

## 2021-11-05 DIAGNOSIS — C679 Malignant neoplasm of bladder, unspecified: Secondary | ICD-10-CM | POA: Diagnosis not present

## 2021-11-05 DIAGNOSIS — C672 Malignant neoplasm of lateral wall of bladder: Secondary | ICD-10-CM | POA: Diagnosis not present

## 2021-11-07 ENCOUNTER — Other Ambulatory Visit: Payer: Self-pay

## 2021-11-07 ENCOUNTER — Ambulatory Visit (HOSPITAL_COMMUNITY): Payer: Medicare Other | Attending: Internal Medicine

## 2021-11-07 DIAGNOSIS — I4891 Unspecified atrial fibrillation: Secondary | ICD-10-CM | POA: Diagnosis not present

## 2021-11-07 LAB — ECHOCARDIOGRAM COMPLETE
Area-P 1/2: 3.12 cm2
P 1/2 time: 472 msec
S' Lateral: 2.4 cm

## 2021-11-11 DIAGNOSIS — C679 Malignant neoplasm of bladder, unspecified: Secondary | ICD-10-CM | POA: Diagnosis not present

## 2021-11-11 DIAGNOSIS — C676 Malignant neoplasm of ureteric orifice: Secondary | ICD-10-CM | POA: Diagnosis not present

## 2021-11-25 DIAGNOSIS — C672 Malignant neoplasm of lateral wall of bladder: Secondary | ICD-10-CM | POA: Diagnosis not present

## 2021-12-09 ENCOUNTER — Telehealth: Payer: Self-pay | Admitting: Internal Medicine

## 2021-12-09 MED ORDER — ELIQUIS 5 MG PO TABS: 5.0000 mg | ORAL_TABLET | Freq: Two times a day (BID) | ORAL | 5 refills | Status: DC

## 2021-12-09 NOTE — Telephone Encounter (Signed)
Prescription refill request for Eliquis received. ?Indication:Afib ?Last office visit:1/23 ?Scr:0.8 ?Age: 88 ?Weight:93 kg ? ?Prescription refilled ? ?

## 2021-12-09 NOTE — Telephone Encounter (Signed)
?*  STAT* If patient is at the pharmacy, call can be transferred to refill team. ? ? ?1. Which medications need to be refilled? (please list name of each medication and dose if known) ELIQUIS 5 MG TABS tablet ? ?2. Which pharmacy/location (including street and city if local pharmacy) is medication to be sent to? [place], [place] AT [place] ? ?3. Do they need a 30 day or 90 day supply? 90 day supply   ? ? ?Patient is completley out of medication.  ?

## 2021-12-24 DIAGNOSIS — R269 Unspecified abnormalities of gait and mobility: Secondary | ICD-10-CM | POA: Diagnosis not present

## 2021-12-24 DIAGNOSIS — R2232 Localized swelling, mass and lump, left upper limb: Secondary | ICD-10-CM | POA: Diagnosis not present

## 2021-12-24 DIAGNOSIS — I4891 Unspecified atrial fibrillation: Secondary | ICD-10-CM | POA: Diagnosis not present

## 2021-12-24 DIAGNOSIS — E559 Vitamin D deficiency, unspecified: Secondary | ICD-10-CM | POA: Diagnosis not present

## 2021-12-24 DIAGNOSIS — R4189 Other symptoms and signs involving cognitive functions and awareness: Secondary | ICD-10-CM | POA: Diagnosis not present

## 2021-12-24 DIAGNOSIS — R1314 Dysphagia, pharyngoesophageal phase: Secondary | ICD-10-CM | POA: Diagnosis not present

## 2021-12-24 DIAGNOSIS — M24542 Contracture, left hand: Secondary | ICD-10-CM | POA: Diagnosis not present

## 2021-12-24 DIAGNOSIS — L03031 Cellulitis of right toe: Secondary | ICD-10-CM | POA: Diagnosis not present

## 2021-12-24 DIAGNOSIS — M810 Age-related osteoporosis without current pathological fracture: Secondary | ICD-10-CM | POA: Diagnosis not present

## 2021-12-24 DIAGNOSIS — I10 Essential (primary) hypertension: Secondary | ICD-10-CM | POA: Diagnosis not present

## 2021-12-24 DIAGNOSIS — Z86010 Personal history of colonic polyps: Secondary | ICD-10-CM | POA: Diagnosis not present

## 2021-12-24 DIAGNOSIS — E538 Deficiency of other specified B group vitamins: Secondary | ICD-10-CM | POA: Diagnosis not present

## 2022-01-02 DIAGNOSIS — R319 Hematuria, unspecified: Secondary | ICD-10-CM | POA: Diagnosis not present

## 2022-01-02 DIAGNOSIS — N390 Urinary tract infection, site not specified: Secondary | ICD-10-CM | POA: Diagnosis not present

## 2022-01-02 DIAGNOSIS — C672 Malignant neoplasm of lateral wall of bladder: Secondary | ICD-10-CM | POA: Diagnosis not present

## 2022-01-16 ENCOUNTER — Ambulatory Visit: Payer: Medicare Other | Admitting: Internal Medicine

## 2022-01-16 DIAGNOSIS — C679 Malignant neoplasm of bladder, unspecified: Secondary | ICD-10-CM | POA: Diagnosis not present

## 2022-01-16 DIAGNOSIS — N390 Urinary tract infection, site not specified: Secondary | ICD-10-CM | POA: Diagnosis not present

## 2022-01-16 DIAGNOSIS — R3129 Other microscopic hematuria: Secondary | ICD-10-CM | POA: Diagnosis not present

## 2022-01-16 DIAGNOSIS — Z87440 Personal history of urinary (tract) infections: Secondary | ICD-10-CM | POA: Diagnosis not present

## 2022-01-16 DIAGNOSIS — R319 Hematuria, unspecified: Secondary | ICD-10-CM | POA: Diagnosis not present

## 2022-01-16 DIAGNOSIS — C672 Malignant neoplasm of lateral wall of bladder: Secondary | ICD-10-CM | POA: Diagnosis not present

## 2022-01-21 ENCOUNTER — Encounter: Payer: Self-pay | Admitting: Internal Medicine

## 2022-01-21 ENCOUNTER — Ambulatory Visit (INDEPENDENT_AMBULATORY_CARE_PROVIDER_SITE_OTHER): Payer: Medicare Other | Admitting: Internal Medicine

## 2022-01-21 VITALS — BP 117/76 | HR 86 | Ht 60.0 in | Wt 118.0 lb

## 2022-01-21 DIAGNOSIS — I4891 Unspecified atrial fibrillation: Secondary | ICD-10-CM | POA: Diagnosis not present

## 2022-01-21 NOTE — Progress Notes (Signed)
?Cardiology Office Note:   ? ?Date:  01/21/2022  ? ?ID:  Gina Mcgee, DOB 1930/08/05, MRN [id number] ? ?PCP:  Vernie Shanks, MD ?  ?[facility] Providers ?Cardiologist:  Janina Mayo, MD    ? ?Referring MD: Vernie Shanks, MD  ? ?No chief complaint on file. ?Low energy ? ?History of Present Illness:   ? ?Gina Mcgee is a 88 y.o. female with a hx of HTN, basal cell carcinoma 06/2020, left sided breast cancer,  brachial plexus neuropathy, referral to see if a "medication can help the activities of her heart", low energy ? ?Her daughter notes that she skips heart beats. She feels dizzy. This has been for the last year. This has been going on for the last 3 months.  She is asymptomatic. She feels like she's floating. Her EKG shows rate controlled atrial fibrillation. Atrial fibrillation is a new diagnosis. She has some chest discomfort and diagnosed with hiatal hernia. 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 This was diagnosed at [facility].  She has no known other cardiac hx. No stress tests. She does not recall echo, a cardiac scan is noted in care everywhere with no results.  She's had bladder cancer s/p treatment with intravesical BCG. She had lumpectomy/XRT for breast cancer in 1980s.TSH 1.7. No snoring. No hx of bleeding. She has had balance issues in the past with fractures. ? ?Crt 0.8 ?93 kg ? ?Interim Hx 5/2 ?Her echo showed normal LV fxn. RV systolic fxn mildly reduced. Mild MR. Mild AI. No evidence of elevated LVEDP.  She denies LH or dizziness. Has some weakness. The metop has helped her " floated symptoms" . No orthopnea or PND. No LE edema.  ? ? ?Past Medical History:  ?Diagnosis Date  ? Brain concussion 1960  ? Cancer [facility])   ? left-sided breast cancer  ? Concussion 1960  ? with left-sided neuro defecits  ? HTN (hypertension)   ? Osteoporosis   ? ? ?Past Surgical History:  ?Procedure Laterality Date  ? BOWEL RESECTION  approx. in 2007  ? polyp and formed stricture   ? BREAST LUMPECTOMY  1981  ? left lumpectomy w/ radiation  ? COLONOSCOPY W/ POLYPECTOMY    ? FEMUR SURGERY  2011  ? left femur pinning  ? JOINT REPLACEMENT    ? ORIF HUMERUS FRACTURE Left 01/25/2013  ? Procedure: OPEN REDUCTION INTERNAL FIXATION (ORIF) DISTAL HUMERUS FRACTURE REPAIR RECONSTRUCTION AND ULNA NERVE DECOMPRESSION AND ANTERIOR TRANSPOSITION AS NECESSARY;  Surgeon: Roseanne Kaufman, MD;  Location: [facility];  Service: Orthopedics;  Laterality: Left;  ? ? ?Current Medications: ?Current Meds  ?Medication Sig  ? Cholecalciferol (VITAMIN D3) 1000 UNITS CAPS Take 1,600 Units by mouth.  ? ELIQUIS 5 MG TABS tablet Take 1 tablet (5 mg total) by mouth 2 (two) times daily.  ? hydrochlorothiazide (HYDRODIURIL) 12.5 MG tablet Take 12.5 mg by mouth daily.  ? memantine (NAMENDA) 5 MG tablet Take 10 mg by mouth daily.  ? metoprolol succinate (TOPROL XL) 25 MG 24 hr tablet Take 1 tablet (25 mg total) by mouth daily.  ? senna-docusate (SENOKOT-S) 8.6-50 MG tablet Take by mouth.  ? ?Current Facility-Administered Medications for the 01/21/22 encounter (Office Visit) with Janina Mayo, MD  ?Medication  ? pneumococcal 13-valent conjugate vaccine (PREVNAR 13) injection 0.5 mL  ?  ? ?Allergies:   Aspirin, Doxycycline, Fish allergy, Nsaids, Aloe, and Sulfa antibiotics  ? ?Social History  ? ?Socioeconomic History  ?  Marital status: Widowed  ?  Spouse name: Not on file  ? Number of children: Not on file  ? Years of education: Not on file  ? Highest education level: Not on file  ?Occupational History  ? Not on file  ?Tobacco Use  ? Smoking status: Never  ? Smokeless tobacco: Never  ?Vaping Use  ? Vaping Use: Never used  ?Substance and Sexual Activity  ? Alcohol use: Not Currently  ? Drug use: No  ? Sexual activity: Not on file  ?Other Topics Concern  ? Not on file  ?Social History Narrative  ? Not on file  ? ?Social Determinants of Health  ? ?Financial Resource Strain: Not on file  ?Food Insecurity: Not on file  ?Transportation Needs: Not  on file  ?Physical Activity: Not on file  ?Stress: Not on file  ?Social Connections: Not on file  ?  ? ?Family History: ?The patient's family history includes Lung cancer in her father; Stroke in her father and mother. ? ?ROS:   ?Please see the history of present illness.    ? All other systems reviewed and are negative. ? ?EKGs/Labs/Other Studies Reviewed:   ? ?The following studies were reviewed today: ? ? ?EKG:  EKG is  ordered today.  The ekg ordered today demonstrates  ? ?Atrial fibrillation rate 90s ? ?Recent Labs: ?No results found for requested labs within last 8760 hours.  ?Recent Lipid Panel ?No results found for: CHOL, TRIG, HDL, CHOLHDL, VLDL, LDLCALC, LDLDIRECT ? ? ?Risk Assessment/Calculations:   ? ?CHA2DS2-VASc Score = 4  ? This indicates a 4.8% annual risk of stroke. ?The patient's score is based upon: ?CHF History: 0 ?HTN History: 1 ?Diabetes History: 0 ?Stroke History: 0 ?Vascular Disease History: 0 ?Age Score: 2 ?Gender Score: 1 ?  ? ? ?    ? ?Physical Exam:   ? ?VS:   ? ?Vitals:  ? 01/21/22 1043  ?BP: 117/76  ?Pulse: 86  ?SpO2: 95%  ? ? ? ? ?Wt Readings from Last 3 Encounters:  ?01/21/22 118 lb (53.5 kg)  ?11/16/19 205 lb (93 kg)  ?10/13/13 107 lb 12.8 oz (48.9 kg)  ?  ? ?GEN:  Well nourished, frail sitting in a wheel chair ?HEENT: Normal ?NECK: No JVD; No carotid bruits ?LYMPHATICS: No lymphadenopathy ?CARDIAC: regular rate and rhythm, no murmurs, rubs, gallops ?RESPIRATORY:  Clear to auscultation without rales, wheezing or rhonchi  ?ABDOMEN: Soft, non-tender, non-distended ?MUSCULOSKELETAL:  No edema; No deformity  ?SKIN: Warm and dry ?NEUROLOGIC:  Alert and oriented x 3; left sided weakness in the UE ?PSYCHIATRIC:  Normal affect  ? ?ASSESSMENT:   ? ?#Paroxysmal Atrial Fibrillation:  Chads2vasc 4. New onset Jan 2023.Marland Kitchen She has not hx of thyroid, liver or lung dx.  No hx of sleep apnea. She has no known cardiac disease. We discussed plan for rate control for now. Can consider DCCV/amiodarone if  she has progressive symptoms or develops heart failure. She's maintained on eliquis. She has had falls but no significant bleeds on eliquis, will continue for now. If develops renal dysfunction or weight drops will need to reduce the dose. LV function is normal.  ?-continue eliquis 5 mg BID { > 80, normal crt clearance, > 80 kg] ?-continue metoprolol 25 mg XL  ? ? ?#CardioOnc: Undergoing BCG tx. No cardiac limitations with this. No signs of radiation induced  valvular fibrosis. ? ? ?PLAN:   ? ?In order of problems listed above: ? ?No changes ?Follow up in 6 months ? ?   ? ?  ?  Medication Adjustments/Labs and Tests Ordered: ?Current medicines are reviewed at length with the patient today.  Concerns regarding medicines are outlined above.  ?No orders of the defined types were placed in this encounter. ? ?No orders of the defined types were placed in this encounter. ? ? ?Patient Instructions  ?Medication Instructions:  ?No Changes In Medications at this time.  ?*If you need a refill on your cardiac medications before your next appointment, please call your pharmacy* ? ?Lab Work: ?None Ordered At This Time.  ?If you have labs (blood work) drawn today and your tests are completely normal, you will receive your results only by: ?MyChart Message (if you have MyChart) OR ?A paper copy in the mail ?If you have any lab test that is abnormal or we need to change your treatment, we will call you to review the results. ? ?Testing/Procedures: ?None Ordered At This Time.  ? ?Follow-Up: ?At [facility], you and your health needs are our priority.  As part of our continuing mission to provide you with exceptional heart care, we have created designated Provider Care Teams.  These Care Teams include your primary Cardiologist (physician) and Advanced Practice Providers (APPs -  Physician Assistants and Nurse Practitioners) who all work together to provide you with the care you need, when you need it. ? ?Your next appointment:   ?6  month(s) ? ?The format for your next appointment:   ?In Person ? ?Provider:   ?Janina Mayo, MD   ? ? ?   ? ?Signed, ?Janina Mayo, MD  ?01/21/2022 11:06 AM    ?[place] ?

## 2022-01-21 NOTE — Patient Instructions (Signed)
Medication Instructions:  ?No Changes In Medications at this time.  ?*If you need a refill on your cardiac medications before your next appointment, please call your pharmacy* ? ?Lab Work: ?None Ordered At This Time.  ?If you have labs (blood work) drawn today and your tests are completely normal, you will receive your results only by: ?MyChart Message (if you have MyChart) OR ?A paper copy in the mail ?If you have any lab test that is abnormal or we need to change your treatment, we will call you to review the results. ? ?Testing/Procedures: ?None Ordered At This Time.  ? ?Follow-Up: ?At [facility], you and your health needs are our priority.  As part of our continuing mission to provide you with exceptional heart care, we have created designated Provider Care Teams.  These Care Teams include your primary Cardiologist (physician) and Advanced Practice Providers (APPs -  Physician Assistants and Nurse Practitioners) who all work together to provide you with the care you need, when you need it. ? ?Your next appointment:   ?6 month(s) ? ?The format for your next appointment:   ?In Person ? ?Provider:   ?Branch, Mary E, MD   ?

## 2022-01-28 DIAGNOSIS — C672 Malignant neoplasm of lateral wall of bladder: Secondary | ICD-10-CM | POA: Diagnosis not present

## 2022-01-30 DIAGNOSIS — D510 Vitamin B12 deficiency anemia due to intrinsic factor deficiency: Secondary | ICD-10-CM | POA: Diagnosis not present

## 2022-02-06 DIAGNOSIS — C672 Malignant neoplasm of lateral wall of bladder: Secondary | ICD-10-CM | POA: Diagnosis not present

## 2022-02-24 ENCOUNTER — Telehealth: Payer: Self-pay | Admitting: Internal Medicine

## 2022-02-24 NOTE — Telephone Encounter (Signed)
*  STAT* If patient is at the pharmacy, call can be transferred to refill team.   1. Which medications need to be refilled? (please list name of each medication and dose if known)   metoprolol succinate (TOPROL XL) 25 MG 24 hr tablet   2. Which pharmacy/location (including street and city if local pharmacy) is medication to be sent to?  [place], [place] - [address] AT [place] 3. Do they need a 30 day or 90 day supply?  90 day

## 2022-02-25 MED ORDER — METOPROLOL SUCCINATE ER 25 MG PO TB24: 25.0000 mg | ORAL_TABLET | Freq: Every day | ORAL | 3 refills | Status: DC

## 2022-03-04 DIAGNOSIS — R8290 Unspecified abnormal findings in urine: Secondary | ICD-10-CM | POA: Diagnosis not present

## 2022-03-04 DIAGNOSIS — D510 Vitamin B12 deficiency anemia due to intrinsic factor deficiency: Secondary | ICD-10-CM | POA: Diagnosis not present

## 2022-03-04 DIAGNOSIS — R319 Hematuria, unspecified: Secondary | ICD-10-CM | POA: Diagnosis not present

## 2022-03-04 DIAGNOSIS — R82998 Other abnormal findings in urine: Secondary | ICD-10-CM | POA: Diagnosis not present

## 2022-03-04 DIAGNOSIS — N390 Urinary tract infection, site not specified: Secondary | ICD-10-CM | POA: Diagnosis not present

## 2022-04-01 DIAGNOSIS — D510 Vitamin B12 deficiency anemia due to intrinsic factor deficiency: Secondary | ICD-10-CM | POA: Diagnosis not present

## 2022-04-16 DIAGNOSIS — C672 Malignant neoplasm of lateral wall of bladder: Secondary | ICD-10-CM | POA: Diagnosis not present

## 2022-04-16 DIAGNOSIS — C679 Malignant neoplasm of bladder, unspecified: Secondary | ICD-10-CM | POA: Diagnosis not present

## 2022-04-16 DIAGNOSIS — I4891 Unspecified atrial fibrillation: Secondary | ICD-10-CM | POA: Insufficient documentation

## 2022-05-07 DIAGNOSIS — R4189 Other symptoms and signs involving cognitive functions and awareness: Secondary | ICD-10-CM | POA: Diagnosis not present

## 2022-05-07 DIAGNOSIS — I4891 Unspecified atrial fibrillation: Secondary | ICD-10-CM | POA: Diagnosis not present

## 2022-05-07 DIAGNOSIS — Z23 Encounter for immunization: Secondary | ICD-10-CM | POA: Diagnosis not present

## 2022-05-07 DIAGNOSIS — B351 Tinea unguium: Secondary | ICD-10-CM | POA: Diagnosis not present

## 2022-05-07 DIAGNOSIS — E538 Deficiency of other specified B group vitamins: Secondary | ICD-10-CM | POA: Diagnosis not present

## 2022-05-07 DIAGNOSIS — Z79899 Other long term (current) drug therapy: Secondary | ICD-10-CM | POA: Diagnosis not present

## 2022-05-07 DIAGNOSIS — I10 Essential (primary) hypertension: Secondary | ICD-10-CM | POA: Diagnosis not present

## 2022-05-07 DIAGNOSIS — Z6825 Body mass index (BMI) 25.0-25.9, adult: Secondary | ICD-10-CM | POA: Diagnosis not present

## 2022-06-16 ENCOUNTER — Telehealth: Payer: Self-pay | Admitting: Internal Medicine

## 2022-06-16 DIAGNOSIS — I4891 Unspecified atrial fibrillation: Secondary | ICD-10-CM

## 2022-06-16 NOTE — Telephone Encounter (Signed)
Prescription refill request for Eliquis received. Indication: afib  Last office visit: Branch, 01/21/2022 Scr: 0.9,  12/24/2021 Age: 88 yo  Weight: 53.5 kg    Per dosing criteria pt qualifies for a dose decrease of Eliquis. Please advise.

## 2022-06-16 NOTE — Telephone Encounter (Signed)
*  STAT* If patient is at the pharmacy, call can be transferred to refill team.   1. Which medications need to be refilled? (please list name of each medication and dose if known)  ELIQUIS 5 MG TABS tablet  2. Which pharmacy/location (including street and city if local pharmacy) is medication to be sent to? [place], [place] - [address] AT [place]  3. Do they need a 30 day or 90 day supply?   90 day supply  Patient ran out of medication yesterday, 9/24.

## 2022-06-16 NOTE — Telephone Encounter (Signed)
Yes, please decrease to 2.'5mg'$  BID

## 2022-06-16 NOTE — Telephone Encounter (Signed)
Attempted to call, LMOM to call [phone].

## 2022-06-17 MED ORDER — APIXABAN 2.5 MG PO TABS: 2.5000 mg | ORAL_TABLET | Freq: Two times a day (BID) | ORAL | 5 refills | Status: DC

## 2022-06-17 NOTE — Telephone Encounter (Signed)
Gina Mcgee called back in reference to a message she received.  I spoke with dtr, Gina Mcgee, on DPR and advised that the dose of eliquis was being reduced to 2.'5mg'$  from '5mg'$  and she verbalized understanding. She stated it ran out quicker than it was supposed to cause she packed enough for being out of town and that the housekeeping services took it while they were out of town. Asked if she had reported to where she was staying and she said that it was only 2 days of meds and no.  She was advised the pt will continue take it twice a day. Send in refill at this time to requested pharmacy. Advised to call back if needed.

## 2022-06-19 DIAGNOSIS — Z6825 Body mass index (BMI) 25.0-25.9, adult: Secondary | ICD-10-CM | POA: Diagnosis not present

## 2022-06-19 DIAGNOSIS — R410 Disorientation, unspecified: Secondary | ICD-10-CM | POA: Diagnosis not present

## 2022-06-19 DIAGNOSIS — R413 Other amnesia: Secondary | ICD-10-CM | POA: Diagnosis not present

## 2022-06-25 ENCOUNTER — Telehealth: Payer: Self-pay | Admitting: Internal Medicine

## 2022-06-25 MED ORDER — METOPROLOL SUCCINATE ER 25 MG PO TB24: 25.0000 mg | ORAL_TABLET | Freq: Every day | ORAL | 3 refills | Status: DC

## 2022-06-25 NOTE — Telephone Encounter (Signed)
*  STAT* If patient is at the pharmacy, call can be transferred to refill team.   1. Which medications need to be refilled? (please list name of each medication and dose if known)   metoprolol succinate (TOPROL XL) 25 MG 24 hr tablet  2. Which pharmacy/location (including street and city if local pharmacy) is medication to be sent to?  [place], [place] - [address] AT [place]  3. Do they need a 30 day or 90 day supply? 90 day  Daughter stated patient is completely out of this medication.  Patient has an appointment scheduled on 07/24/22.

## 2022-07-01 DIAGNOSIS — C672 Malignant neoplasm of lateral wall of bladder: Secondary | ICD-10-CM | POA: Diagnosis not present

## 2022-07-08 DIAGNOSIS — C672 Malignant neoplasm of lateral wall of bladder: Secondary | ICD-10-CM | POA: Diagnosis not present

## 2022-07-08 DIAGNOSIS — C679 Malignant neoplasm of bladder, unspecified: Secondary | ICD-10-CM | POA: Diagnosis not present

## 2022-07-08 DIAGNOSIS — N390 Urinary tract infection, site not specified: Secondary | ICD-10-CM | POA: Diagnosis not present

## 2022-07-08 DIAGNOSIS — R3129 Other microscopic hematuria: Secondary | ICD-10-CM | POA: Diagnosis not present

## 2022-07-08 DIAGNOSIS — R319 Hematuria, unspecified: Secondary | ICD-10-CM | POA: Diagnosis not present

## 2022-07-09 DIAGNOSIS — D1801 Hemangioma of skin and subcutaneous tissue: Secondary | ICD-10-CM | POA: Diagnosis not present

## 2022-07-09 DIAGNOSIS — L821 Other seborrheic keratosis: Secondary | ICD-10-CM | POA: Diagnosis not present

## 2022-07-09 DIAGNOSIS — Z85828 Personal history of other malignant neoplasm of skin: Secondary | ICD-10-CM | POA: Diagnosis not present

## 2022-07-09 DIAGNOSIS — D692 Other nonthrombocytopenic purpura: Secondary | ICD-10-CM | POA: Diagnosis not present

## 2022-07-15 DIAGNOSIS — C672 Malignant neoplasm of lateral wall of bladder: Secondary | ICD-10-CM | POA: Diagnosis not present

## 2022-07-24 ENCOUNTER — Encounter: Payer: Self-pay | Admitting: Internal Medicine

## 2022-07-24 ENCOUNTER — Ambulatory Visit: Payer: Medicare Other | Attending: Internal Medicine | Admitting: Internal Medicine

## 2022-07-24 VITALS — BP 125/74 | HR 75 | Resp 96 | Ht 60.0 in | Wt 125.4 lb

## 2022-07-24 DIAGNOSIS — R600 Localized edema: Secondary | ICD-10-CM | POA: Diagnosis not present

## 2022-07-24 DIAGNOSIS — I4891 Unspecified atrial fibrillation: Secondary | ICD-10-CM | POA: Insufficient documentation

## 2022-07-24 NOTE — Patient Instructions (Signed)
Medication Instructions:  Your physician recommends that you continue on your current medications as directed. Please refer to the Current Medication list given to you today.  *If you need a refill on your cardiac medications before your next appointment, please call your pharmacy*   Lab Work: Your physician recommends that you have the following labs drawn today : BNP and BMET  If you have labs (blood work) drawn today and your tests are completely normal, you will receive your results only by: [place] (if you have MyChart) OR A paper copy in the mail If you have any lab test that is abnormal or we need to change your treatment, we will call you to review the results.   Testing/Procedures: Your physician has requested that you have an echocardiogram. Echocardiography is a painless test that uses sound waves to create images of your heart. It provides your doctor with information about the size and shape of your heart and how well your heart's chambers and valves are working. This procedure takes approximately one hour. There are no restrictions for this procedure. Please do NOT wear cologne, perfume, aftershave, or lotions (deodorant is allowed). Please arrive 15 minutes prior to your appointment time.    Follow-Up: At [facility], you and your health needs are our priority.  As part of our continuing mission to provide you with exceptional heart care, we have created designated Provider Care Teams.  These Care Teams include your primary Cardiologist (physician) and Advanced Practice Providers (APPs -  Physician Assistants and Nurse Practitioners) who all work together to provide you with the care you need, when you need it.  We recommend signing up for the patient portal called "MyChart".  Sign up information is provided on this After Visit Summary.  MyChart is used to connect with patients for Virtual Visits (Telemedicine).  Patients are able to view lab/test results,  encounter notes, upcoming appointments, etc.  Non-urgent messages can be sent to your provider as well.   To learn more about what you can do with MyChart, go to [email or web address].    Your next appointment:   3 month(s)  The format for your next appointment:   In Person  Provider:   Janina Mayo, MD

## 2022-07-24 NOTE — Progress Notes (Signed)
Cardiology Office Note:    Date:  07/24/2022   ID:  Gina Mcgee, DOB 12/24/1929, MRN [id number]  PCP:  Vernie Shanks, MD (Inactive)   [facility] Providers Cardiologist:  Janina Mayo, MD     Referring MD: Vernie Shanks, MD   Chief Complaint  Patient presents with   Follow-up  Low energy  History of Present Illness:    Gina Mcgee is a 88 y.o. female with a hx of HTN, basal cell carcinoma 06/2020, left sided breast cancer,  brachial plexus neuropathy, referral to see if a "medication can help the activities of her heart", low energy  Her daughter notes that she skips heart beats. She feels dizzy. This has been for the last year. This has been going on for the last 3 months.  She is asymptomatic. She feels like she's floating. Her EKG shows rate controlled atrial fibrillation. Atrial fibrillation is a new diagnosis. She has some chest discomfort and diagnosed with hiatal hernia. 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 This was diagnosed at [facility].  She has no known other cardiac hx. No stress tests. She does not recall echo, a cardiac scan is noted in care everywhere with no results.  She's had bladder cancer s/p treatment with intravesical BCG. She had lumpectomy/XRT for breast cancer in 1980s.TSH 1.7. No snoring. No hx of bleeding. She has had balance issues in the past with fractures.  Crt 0.8 93 kg  Interim Hx 5/2 Her echo showed normal LV fxn. RV systolic fxn mildly reduced. Mild MR. Mild AI. No evidence of elevated LVEDP.  She denies LH or dizziness. Has some weakness. The metop has helped her " floated symptoms" . No orthopnea or PND. No LE edema.   Interim Hx 11/2 No recent hospitalizations.  Notes some leg swelling after a flight. Improves with putting her legs up. No orthopnea/PND.     Cardiology Studies: TTE 11/07/2021: EF 60-65%, RV fxn, mild MR   Past Medical History:  Diagnosis Date   Brain concussion 1960   Cancer  [facility])    left-sided breast cancer   Concussion 1960   with left-sided neuro defecits   HTN (hypertension)    Osteoporosis     Past Surgical History:  Procedure Laterality Date   BOWEL RESECTION  approx. in 2007   polyp and formed stricture   BREAST LUMPECTOMY  1981   left lumpectomy w/ radiation   COLONOSCOPY W/ POLYPECTOMY     FEMUR SURGERY  2011   left femur pinning   JOINT REPLACEMENT     ORIF HUMERUS FRACTURE Left 01/25/2013   Procedure: OPEN REDUCTION INTERNAL FIXATION (ORIF) DISTAL HUMERUS FRACTURE REPAIR RECONSTRUCTION AND ULNA NERVE DECOMPRESSION AND ANTERIOR TRANSPOSITION AS NECESSARY;  Surgeon: Roseanne Kaufman, MD;  Location: [place];  Service: Orthopedics;  Laterality: Left;    Current Medications: Current Meds  Medication Sig   apixaban (ELIQUIS) 2.5 MG TABS tablet Take 1 tablet (2.5 mg total) by mouth 2 (two) times daily.   Cholecalciferol (VITAMIN D3) 1000 UNITS CAPS Take 1,600 Units by mouth.   hydrochlorothiazide (HYDRODIURIL) 12.5 MG tablet Take 12.5 mg by mouth daily.   ketoconazole (NIZORAL) 2 % cream    memantine (NAMENDA) 5 MG tablet Take 10 mg by mouth daily.   metoprolol succinate (TOPROL XL) 25 MG 24 hr tablet Take 1 tablet (25 mg total) by mouth daily.   senna-docusate (SENOKOT-S) 8.6-50 MG tablet Take by mouth.   Current  Facility-Administered Medications for the 07/24/22 encounter (Office Visit) with Janina Mayo, MD  Medication   pneumococcal 13-valent conjugate vaccine (PREVNAR 13) injection 0.5 mL     Allergies:   Aspirin, Doxycycline, Fish allergy, Nsaids, Aloe, and Sulfa antibiotics   Social History   Socioeconomic History   Marital status: Widowed    Spouse name: Not on file   Number of children: Not on file   Years of education: Not on file   Highest education level: Not on file  Occupational History   Not on file  Tobacco Use   Smoking status: Never   Smokeless tobacco: Never  Vaping Use   Vaping Use: Never used  Substance and  Sexual Activity   Alcohol use: Not Currently   Drug use: No   Sexual activity: Not on file  Other Topics Concern   Not on file  Social History Narrative   Not on file   Social Determinants of Health   Financial Resource Strain: Not on file  Food Insecurity: Not on file  Transportation Needs: Not on file  Physical Activity: Not on file  Stress: Not on file  Social Connections: Not on file     Family History: The patient's family history includes Lung cancer in her father; Stroke in her father and mother.  ROS:   Please see the history of present illness.     All other systems reviewed and are negative.  EKGs/Labs/Other Studies Reviewed:    The following studies were reviewed today:   EKG:  EKG is  ordered today.  The ekg ordered today demonstrates   Atrial fibrillation rate 90s  07/24/2022- sinus rhythm with sinus arrhythmia  Recent Labs: No results found for requested labs within last 365 days.  Recent Lipid Panel No results found for: "CHOL", "TRIG", "HDL", "CHOLHDL", "VLDL", "LDLCALC", "LDLDIRECT"   Risk Assessment/Calculations:    CHA2DS2-VASc Score = 4   This indicates a 4.8% annual risk of stroke. The patient's score is based upon: CHF History: 0 HTN History: 1 Diabetes History: 0 Stroke History: 0 Vascular Disease History: 0 Age Score: 2 Gender Score: 1            Physical Exam:    VS:   Vitals:   07/24/22 1041  BP: 125/74  Pulse: 75  Resp: (!) 96      Wt Readings from Last 3 Encounters:  07/24/22 125 lb 6.4 oz (56.9 kg)  01/21/22 118 lb (53.5 kg)  11/16/19 205 lb (93 kg)     GEN:  Well nourished, frail sitting in a wheel chair HEENT: Normal NECK: No JVD; No carotid bruits LYMPHATICS: No lymphadenopathy CARDIAC: regular rate and rhythm, no murmurs, rubs, gallops RESPIRATORY:  Clear to auscultation without rales, wheezing or rhonchi  ABDOMEN: Soft, non-tender, non-distended MUSCULOSKELETAL:  No edema; No deformity  SKIN: Warm  and dry NEUROLOGIC:  Alert and oriented x 3; left sided weakness in the UE PSYCHIATRIC:  Normal affect   ASSESSMENT:    ?LE Edema: don't see today but daughter is concerned.  Appears euvolemic. Will get labs and TTE.  #Paroxysmal Atrial Fibrillation:  Chads2vasc 4. New onset Jan 2023.Marland Kitchen She has not hx of thyroid, liver or lung dx.  No hx of sleep apnea. She has no known cardiac disease. We discussed plan for rate control for now. Can consider DCCV/amiodarone if she has progressive symptoms or develops heart failure. She's maintained on eliquis. She has had falls but no significant bleeds on eliquis, will continue for  now. If develops renal dysfunction or weight drops will need to reduce the dose. LV function is normal.  - continue eliquis 2.5 mg BID -continue metoprolol 25 mg XL   #CardioOnc: Undergoing BCG tx. No cardiac limitations with this. No signs of radiation induced  valvular fibrosis.   PLAN:    In order of problems listed above:   BNP, BMET, Limited TTE Follow up in 3 months        Medication Adjustments/Labs and Tests Ordered: Current medicines are reviewed at length with the patient today.  Concerns regarding medicines are outlined above.  Orders Placed This Encounter  Procedures   B Nat Peptide   Basic Metabolic Panel (BMET)   EKG 12-Lead   ECHOCARDIOGRAM LIMITED   No orders of the defined types were placed in this encounter.   Patient Instructions  Medication Instructions:  Your physician recommends that you continue on your current medications as directed. Please refer to the Current Medication list given to you today.  *If you need a refill on your cardiac medications before your next appointment, please call your pharmacy*   Lab Work: Your physician recommends that you have the following labs drawn today : BNP and BMET  If you have labs (blood work) drawn today and your tests are completely normal, you will receive your results only by: [place]  (if you have MyChart) OR A paper copy in the mail If you have any lab test that is abnormal or we need to change your treatment, we will call you to review the results.   Testing/Procedures: Your physician has requested that you have an echocardiogram. Echocardiography is a painless test that uses sound waves to create images of your heart. It provides your doctor with information about the size and shape of your heart and how well your heart's chambers and valves are working. This procedure takes approximately one hour. There are no restrictions for this procedure. Please do NOT wear cologne, perfume, aftershave, or lotions (deodorant is allowed). Please arrive 15 minutes prior to your appointment time.    Follow-Up: At [facility], you and your health needs are our priority.  As part of our continuing mission to provide you with exceptional heart care, we have created designated Provider Care Teams.  These Care Teams include your primary Cardiologist (physician) and Advanced Practice Providers (APPs -  Physician Assistants and Nurse Practitioners) who all work together to provide you with the care you need, when you need it.  We recommend signing up for the patient portal called "MyChart".  Sign up information is provided on this After Visit Summary.  MyChart is used to connect with patients for Virtual Visits (Telemedicine).  Patients are able to view lab/test results, encounter notes, upcoming appointments, etc.  Non-urgent messages can be sent to your provider as well.   To learn more about what you can do with MyChart, go to [email or web address].    Your next appointment:   3 month(s)  The format for your next appointment:   In Person  Provider:   Janina Mayo, MD     Signed, Janina Mayo, MD  07/24/2022 12:19 PM    [place]

## 2022-07-26 LAB — BASIC METABOLIC PANEL
BUN/Creatinine Ratio: 23 (ref 12–28)
BUN: 21 mg/dL (ref 8–27)
CO2: 26 mmol/L (ref 20–29)
Calcium: 9.6 mg/dL (ref 8.7–10.3)
Chloride: 96 mmol/L (ref 96–106)
Creatinine, Ser: 0.93 mg/dL (ref 0.57–1.00)
Glucose: 82 mg/dL (ref 70–99)
Potassium: 4.1 mmol/L (ref 3.5–5.2)
Sodium: 137 mmol/L (ref 134–144)
eGFR: 59 mL/min/{1.73_m2} — ABNORMAL LOW (ref >59)

## 2022-07-26 LAB — BRAIN NATRIURETIC PEPTIDE: BNP: 111.3 pg/mL — ABNORMAL HIGH (ref 0.0–100.0)

## 2022-08-10 DIAGNOSIS — Z23 Encounter for immunization: Secondary | ICD-10-CM | POA: Diagnosis not present

## 2022-08-12 DIAGNOSIS — H353131 Nonexudative age-related macular degeneration, bilateral, early dry stage: Secondary | ICD-10-CM | POA: Diagnosis not present

## 2022-08-12 DIAGNOSIS — H52203 Unspecified astigmatism, bilateral: Secondary | ICD-10-CM | POA: Diagnosis not present

## 2022-08-12 DIAGNOSIS — H5010 Unspecified exotropia: Secondary | ICD-10-CM | POA: Diagnosis not present

## 2022-08-12 DIAGNOSIS — H5213 Myopia, bilateral: Secondary | ICD-10-CM | POA: Diagnosis not present

## 2022-08-13 ENCOUNTER — Ambulatory Visit (HOSPITAL_COMMUNITY): Payer: Medicare Other | Attending: Internal Medicine

## 2022-08-13 DIAGNOSIS — R600 Localized edema: Secondary | ICD-10-CM | POA: Insufficient documentation

## 2022-08-13 LAB — ECHOCARDIOGRAM LIMITED
Area-P 1/2: 4.86 cm2
P 1/2 time: 412 msec
S' Lateral: 2.9 cm

## 2022-08-16 ENCOUNTER — Ambulatory Visit (INDEPENDENT_AMBULATORY_CARE_PROVIDER_SITE_OTHER)
Admission: RE | Admit: 2022-08-16 | Discharge: 2022-08-16 | Disposition: A | Discharge status: Home / Self Care | Payer: Medicare Other | Source: Ambulatory Visit

## 2022-08-16 ENCOUNTER — Ambulatory Visit (INDEPENDENT_AMBULATORY_CARE_PROVIDER_SITE_OTHER): Payer: Medicare Other

## 2022-08-16 VITALS — BP 126/74 | HR 88 | Temp 97.8°F | Resp 24

## 2022-08-16 DIAGNOSIS — J069 Acute upper respiratory infection, unspecified: Secondary | ICD-10-CM

## 2022-08-16 DIAGNOSIS — Z1152 Encounter for screening for COVID-19: Secondary | ICD-10-CM | POA: Diagnosis not present

## 2022-08-16 DIAGNOSIS — R059 Cough, unspecified: Secondary | ICD-10-CM

## 2022-08-16 DIAGNOSIS — Z853 Personal history of malignant neoplasm of breast: Secondary | ICD-10-CM | POA: Diagnosis not present

## 2022-08-16 DIAGNOSIS — Z8551 Personal history of malignant neoplasm of bladder: Secondary | ICD-10-CM | POA: Diagnosis not present

## 2022-08-16 DIAGNOSIS — J90 Pleural effusion, not elsewhere classified: Secondary | ICD-10-CM | POA: Diagnosis not present

## 2022-08-16 DIAGNOSIS — J121 Respiratory syncytial virus pneumonia: Secondary | ICD-10-CM | POA: Diagnosis not present

## 2022-08-16 DIAGNOSIS — Z886 Allergy status to analgesic agent status: Secondary | ICD-10-CM | POA: Diagnosis not present

## 2022-08-16 DIAGNOSIS — K449 Diaphragmatic hernia without obstruction or gangrene: Secondary | ICD-10-CM | POA: Diagnosis not present

## 2022-08-16 DIAGNOSIS — Z91048 Other nonmedicinal substance allergy status: Secondary | ICD-10-CM | POA: Diagnosis not present

## 2022-08-16 DIAGNOSIS — R051 Acute cough: Secondary | ICD-10-CM | POA: Diagnosis not present

## 2022-08-16 DIAGNOSIS — G540 Brachial plexus disorders: Secondary | ICD-10-CM | POA: Diagnosis present

## 2022-08-16 DIAGNOSIS — J168 Pneumonia due to other specified infectious organisms: Secondary | ICD-10-CM | POA: Diagnosis not present

## 2022-08-16 DIAGNOSIS — I10 Essential (primary) hypertension: Secondary | ICD-10-CM | POA: Diagnosis not present

## 2022-08-16 DIAGNOSIS — Z7901 Long term (current) use of anticoagulants: Secondary | ICD-10-CM | POA: Diagnosis not present

## 2022-08-16 DIAGNOSIS — M810 Age-related osteoporosis without current pathological fracture: Secondary | ICD-10-CM | POA: Diagnosis present

## 2022-08-16 DIAGNOSIS — Z881 Allergy status to other antibiotic agents status: Secondary | ICD-10-CM | POA: Diagnosis not present

## 2022-08-16 DIAGNOSIS — E876 Hypokalemia: Secondary | ICD-10-CM | POA: Diagnosis present

## 2022-08-16 DIAGNOSIS — Z79899 Other long term (current) drug therapy: Secondary | ICD-10-CM | POA: Diagnosis not present

## 2022-08-16 DIAGNOSIS — J189 Pneumonia, unspecified organism: Secondary | ICD-10-CM | POA: Diagnosis not present

## 2022-08-16 DIAGNOSIS — I959 Hypotension, unspecified: Secondary | ICD-10-CM | POA: Diagnosis not present

## 2022-08-16 DIAGNOSIS — G3184 Mild cognitive impairment, so stated: Secondary | ICD-10-CM | POA: Diagnosis present

## 2022-08-16 DIAGNOSIS — J9601 Acute respiratory failure with hypoxia: Secondary | ICD-10-CM | POA: Diagnosis not present

## 2022-08-16 DIAGNOSIS — Z882 Allergy status to sulfonamides status: Secondary | ICD-10-CM | POA: Diagnosis not present

## 2022-08-16 DIAGNOSIS — I4891 Unspecified atrial fibrillation: Secondary | ICD-10-CM | POA: Diagnosis present

## 2022-08-16 DIAGNOSIS — R0689 Other abnormalities of breathing: Secondary | ICD-10-CM | POA: Diagnosis not present

## 2022-08-16 DIAGNOSIS — E871 Hypo-osmolality and hyponatremia: Secondary | ICD-10-CM | POA: Diagnosis present

## 2022-08-16 HISTORY — DX: Unspecified atrial fibrillation: I48.91

## 2022-08-16 LAB — SARS CORONAVIRUS 2 (TAT 6-24 HRS): SARS Coronavirus 2: NEGATIVE

## 2022-08-16 MED ORDER — BENZONATATE 100 MG PO CAPS: 100.0000 mg | ORAL_CAPSULE | Freq: Three times a day (TID) | ORAL | 0 refills | Status: DC | PRN

## 2022-08-16 NOTE — ED Provider Notes (Signed)
[facility]RG[facility]  Note:  This document was prepared using Systems analyst and may include unintentional dictation errors.  MRN: [id number] DOB: 29-Aug-1930  Subjective:   Gina Mcgee is a 88 y.o. female presenting for 3 day history of persistent bothersome coughing, malaise. Presents with family that have concerns about pneumonia. Also report longstanding history of persistent sneezing, rhinorrhea, post-nasal drainage. Has a history of emphysema from secondhand smoke years ago. Has not needed an inhaler. No active fever, chest pain, shob, wheezing. Does not like to take medications regularly.    Current Facility-Administered Medications:    pneumococcal 13-valent conjugate vaccine (PREVNAR 13) injection 0.5 mL, 0.5 mL, Intramuscular, Once, Jacelyn Grip, Edwyna Shell, MD  Current Outpatient Medications:    apixaban (ELIQUIS) 2.5 MG TABS tablet, Take 1 tablet (2.5 mg total) by mouth 2 (two) times daily., Disp: 60 tablet, Rfl: 5   Cholecalciferol (VITAMIN D3) 1000 UNITS CAPS, Take 1,600 Units by mouth., Disp: , Rfl:    hydrochlorothiazide (HYDRODIURIL) 12.5 MG tablet, Take 12.5 mg by mouth daily., Disp: , Rfl:    ketoconazole (NIZORAL) 2 % cream, , Disp: , Rfl:    memantine (NAMENDA) 5 MG tablet, Take 10 mg by mouth daily., Disp: , Rfl:    metoprolol succinate (TOPROL XL) 25 MG 24 hr tablet, Take 1 tablet (25 mg total) by mouth daily., Disp: 30 tablet, Rfl: 3   senna-docusate (SENOKOT-S) 8.6-50 MG tablet, Take by mouth., Disp: , Rfl:    Allergies  Allergen Reactions   Aspirin Other (See Comments)    Gets jitter with large doses   Doxycycline Other (See Comments)    Intestines were affected.   Fish Allergy Nausea Only and Other (See Comments)    "makes me jumpy and twitchy, but grand children are allergic"    Nsaids     Can take for short periods of time. Naprosyn -feels dazed   Aloe Rash   Sulfa Antibiotics Rash    Past Medical History:  Diagnosis  Date   A-fib ([place])    Brain concussion 09/22/1958   Cancer [facility])    left-sided breast cancer   Concussion 09/22/1958   with left-sided neuro defecits   HTN (hypertension)    Osteoporosis      Past Surgical History:  Procedure Laterality Date   BOWEL RESECTION  approx. in 2007   polyp and formed stricture   BREAST LUMPECTOMY  1981   left lumpectomy w/ radiation   COLONOSCOPY W/ POLYPECTOMY     FEMUR SURGERY  2011   left femur pinning   JOINT REPLACEMENT     ORIF HUMERUS FRACTURE Left 01/25/2013   Procedure: OPEN REDUCTION INTERNAL FIXATION (ORIF) DISTAL HUMERUS FRACTURE REPAIR RECONSTRUCTION AND ULNA NERVE DECOMPRESSION AND ANTERIOR TRANSPOSITION AS NECESSARY;  Surgeon: Roseanne Kaufman, MD;  Location: [place];  Service: Orthopedics;  Laterality: Left;    Family History  Problem Relation Age of Onset   Stroke Mother    Lung cancer Father    Stroke Father     Social History   Tobacco Use   Smoking status: Never   Smokeless tobacco: Never  Vaping Use   Vaping Use: Never used  Substance Use Topics   Alcohol use: Not Currently   Drug use: No    ROS   Objective:   Vitals: BP 126/74 (BP Location: Right Arm)   Pulse 88   Temp 97.8 F (36.6 C) (Oral)   Resp (!) 24   SpO2 93%  Physical Exam Constitutional:      General: She is not in acute distress.    Appearance: Normal appearance. She is well-developed. She is not ill-appearing, toxic-appearing or diaphoretic.  HENT:     Head: Normocephalic and atraumatic.     Nose: Nose normal.     Mouth/Throat:     Mouth: Mucous membranes are moist.     Pharynx: No oropharyngeal exudate or posterior oropharyngeal erythema.     Comments: Thick streaks of drainage overlying the pharynx.  Eyes:     General: No scleral icterus.       Right eye: No discharge.        Left eye: No discharge.     Extraocular Movements: Extraocular movements intact.  Cardiovascular:     Rate and Rhythm: Normal rate and regular rhythm.     Heart  sounds: Normal heart sounds. No murmur heard.    No friction rub. No gallop.  Pulmonary:     Effort: No respiratory distress.     Breath sounds: No stridor. No wheezing, rhonchi or rales.     Comments: Decreased lung sounds in bibasilar fields.  Chest:     Chest wall: No tenderness.  Skin:    General: Skin is warm and dry.  Neurological:     General: No focal deficit present.     Mental Status: She is alert and oriented to person, place, and time.  Psychiatric:        Mood and Affect: Mood normal.        Behavior: Behavior normal.     DG Chest 2 View  Result Date: 08/16/2022 CLINICAL DATA:  Cough. EXAM: CHEST - 2 VIEW COMPARISON:  03/25/2021 FINDINGS: Lungs are hyperexpanded with increased AP diameter of the chest and focal kyphosis of the midthoracic spine secondary to compression fractures. The lungs are clear without focal pneumonia, edema, pneumothorax or pleural effusion. Cardiopericardial silhouette is at upper limits of normal for size. Small to moderate hiatal hernia noted. Bones are diffusely demineralized. IMPRESSION: 1. Hyperexpansion without acute cardiopulmonary findings. 2. Small to moderate hiatal hernia. Electronically Signed   By: Misty Stanley M.D.   On: 08/16/2022 14:56     Assessment and Plan :   PDMP not reviewed this encounter.  1. Viral upper respiratory infection   2. Acute cough     Creatinine Clearance calculated at 9m/min using creatinine level from 07/24/2022. Chest x-ray negative. Will manage for viral illness such as viral URI, viral syndrome, viral rhinitis, COVID-19. Recommended supportive care. Offered scripts for symptomatic relief. Testing is pending.   If COVID test is positive she would be a great candidate for molnupiravir.   Counseled patient on potential for adverse effects with medications prescribed/recommended today, ER and return-to-clinic precautions discussed, patient verbalized understanding.     MJaynee Eagles PVermont11/26/23  [phone]

## 2022-08-16 NOTE — Discharge Instructions (Addendum)
We will notify you of your test results as they arrive and may take between about 24 hours.  I encourage you to sign up for MyChart if you have not already done so as this can be the easiest way for [place] to communicate results to you online or through a phone app.  Generally, we only contact you if it is a positive test result.  In the meantime, if you develop worsening symptoms including fever, chest pain, shortness of breath despite our current treatment plan then please report to the emergency room as this may be a sign of worsening status from possible viral infection.  Otherwise, we will manage this as a viral syndrome. For sore throat or cough try using a honey-based tea. Use 3 teaspoons of honey with juice squeezed from half lemon. Place shaved pieces of ginger into 1/2-1 cup of water and warm over stove top. Then mix the ingredients and repeat every 4 hours as needed. Please take Tylenol '500mg'$ -'650mg'$  every 6 hours for aches and pains, fevers. Hydrate very well with at least 2 liters of water. Eat light meals such as soups to replenish electrolytes and soft fruits, veggies. Start an antihistamine like Zyrtec at '5mg'$  for postnasal drainage, sinus congestion.  You can take this together with cough medications as needed.

## 2022-08-16 NOTE — ED Triage Notes (Signed)
Pt c/o cough x 3 days-neg home covid test-pt to triage in own w/c-NAD

## 2022-08-17 ENCOUNTER — Inpatient Hospital Stay (HOSPITAL_COMMUNITY)
Admission: EM | Admit: 2022-08-17 | Discharge: 2022-08-20 | DRG: 193 | Disposition: A | Discharge status: Home Health | Payer: Medicare Other | Attending: Internal Medicine | Admitting: Internal Medicine

## 2022-08-17 ENCOUNTER — Encounter (HOSPITAL_COMMUNITY): Payer: Self-pay

## 2022-08-17 ENCOUNTER — Emergency Department (HOSPITAL_COMMUNITY): Payer: Medicare Other

## 2022-08-17 ENCOUNTER — Other Ambulatory Visit: Payer: Self-pay

## 2022-08-17 DIAGNOSIS — I4891 Unspecified atrial fibrillation: Secondary | ICD-10-CM | POA: Diagnosis present

## 2022-08-17 DIAGNOSIS — J9691 Respiratory failure, unspecified with hypoxia: Secondary | ICD-10-CM | POA: Insufficient documentation

## 2022-08-17 DIAGNOSIS — G540 Brachial plexus disorders: Secondary | ICD-10-CM | POA: Diagnosis present

## 2022-08-17 DIAGNOSIS — J121 Respiratory syncytial virus pneumonia: Secondary | ICD-10-CM | POA: Diagnosis present

## 2022-08-17 DIAGNOSIS — Z881 Allergy status to other antibiotic agents status: Secondary | ICD-10-CM

## 2022-08-17 DIAGNOSIS — J9601 Acute respiratory failure with hypoxia: Secondary | ICD-10-CM | POA: Diagnosis present

## 2022-08-17 DIAGNOSIS — Z886 Allergy status to analgesic agent status: Secondary | ICD-10-CM | POA: Diagnosis not present

## 2022-08-17 DIAGNOSIS — Z7901 Long term (current) use of anticoagulants: Secondary | ICD-10-CM | POA: Diagnosis not present

## 2022-08-17 DIAGNOSIS — I959 Hypotension, unspecified: Secondary | ICD-10-CM | POA: Diagnosis not present

## 2022-08-17 DIAGNOSIS — Z91048 Other nonmedicinal substance allergy status: Secondary | ICD-10-CM

## 2022-08-17 DIAGNOSIS — M810 Age-related osteoporosis without current pathological fracture: Secondary | ICD-10-CM | POA: Diagnosis present

## 2022-08-17 DIAGNOSIS — J189 Pneumonia, unspecified organism: Secondary | ICD-10-CM

## 2022-08-17 DIAGNOSIS — R059 Cough, unspecified: Secondary | ICD-10-CM | POA: Diagnosis not present

## 2022-08-17 DIAGNOSIS — Z1152 Encounter for screening for COVID-19: Secondary | ICD-10-CM

## 2022-08-17 DIAGNOSIS — E876 Hypokalemia: Secondary | ICD-10-CM | POA: Diagnosis present

## 2022-08-17 DIAGNOSIS — Z8551 Personal history of malignant neoplasm of bladder: Secondary | ICD-10-CM | POA: Diagnosis not present

## 2022-08-17 DIAGNOSIS — Z853 Personal history of malignant neoplasm of breast: Secondary | ICD-10-CM | POA: Diagnosis not present

## 2022-08-17 DIAGNOSIS — E871 Hypo-osmolality and hyponatremia: Secondary | ICD-10-CM | POA: Diagnosis present

## 2022-08-17 DIAGNOSIS — I10 Essential (primary) hypertension: Secondary | ICD-10-CM | POA: Diagnosis present

## 2022-08-17 DIAGNOSIS — G3184 Mild cognitive impairment, so stated: Secondary | ICD-10-CM | POA: Diagnosis present

## 2022-08-17 DIAGNOSIS — J168 Pneumonia due to other specified infectious organisms: Secondary | ICD-10-CM | POA: Diagnosis not present

## 2022-08-17 DIAGNOSIS — Z79899 Other long term (current) drug therapy: Secondary | ICD-10-CM | POA: Diagnosis not present

## 2022-08-17 DIAGNOSIS — Z882 Allergy status to sulfonamides status: Secondary | ICD-10-CM

## 2022-08-17 DIAGNOSIS — C679 Malignant neoplasm of bladder, unspecified: Secondary | ICD-10-CM | POA: Insufficient documentation

## 2022-08-17 DIAGNOSIS — R0689 Other abnormalities of breathing: Secondary | ICD-10-CM | POA: Diagnosis not present

## 2022-08-17 DIAGNOSIS — J90 Pleural effusion, not elsewhere classified: Secondary | ICD-10-CM | POA: Diagnosis not present

## 2022-08-17 HISTORY — DX: Pneumonia, unspecified organism: J18.9

## 2022-08-17 LAB — COMPREHENSIVE METABOLIC PANEL
ALT: 16 U/L (ref 0–44)
AST: 33 U/L (ref 15–41)
Albumin: 3.7 g/dL (ref 3.5–5.0)
Alkaline Phosphatase: 85 U/L (ref 38–126)
Anion gap: 13 (ref 5–15)
BUN: 20 mg/dL (ref 8–23)
CO2: 25 mmol/L (ref 22–32)
Calcium: 9.1 mg/dL (ref 8.9–10.3)
Chloride: 96 mmol/L — ABNORMAL LOW (ref 98–111)
Creatinine, Ser: 0.91 mg/dL (ref 0.44–1.00)
GFR, Estimated: >60 mL/min (ref >60)
Glucose, Bld: 129 mg/dL — ABNORMAL HIGH (ref 70–99)
Potassium: 2.9 mmol/L — ABNORMAL LOW (ref 3.5–5.1)
Sodium: 134 mmol/L — ABNORMAL LOW (ref 135–145)
Total Bilirubin: 0.6 mg/dL (ref 0.3–1.2)
Total Protein: 7.1 g/dL (ref 6.5–8.1)

## 2022-08-17 LAB — I-STAT VENOUS BLOOD GAS, ED
Acid-Base Excess: 5 mmol/L — ABNORMAL HIGH (ref 0.0–2.0)
Bicarbonate: 28.8 mmol/L — ABNORMAL HIGH (ref 20.0–28.0)
Calcium, Ion: 1.12 mmol/L — ABNORMAL LOW (ref 1.15–1.40)
HCT: 37 % (ref 36.0–46.0)
Hemoglobin: 12.6 g/dL (ref 12.0–15.0)
O2 Saturation: 84 %
Potassium: 3.1 mmol/L — ABNORMAL LOW (ref 3.5–5.1)
Sodium: 134 mmol/L — ABNORMAL LOW (ref 135–145)
TCO2: 30 mmol/L (ref 22–32)
pCO2, Ven: 39.6 mmHg — ABNORMAL LOW (ref 44–60)
pH, Ven: 7.47 — ABNORMAL HIGH (ref 7.25–7.43)
pO2, Ven: 45 mmHg (ref 32–45)

## 2022-08-17 LAB — URINALYSIS, ROUTINE W REFLEX MICROSCOPIC
Bacteria, UA: NONE SEEN
Bilirubin Urine: NEGATIVE
Glucose, UA: NEGATIVE mg/dL
Ketones, ur: NEGATIVE mg/dL
Leukocytes,Ua: NEGATIVE
Nitrite: NEGATIVE
Protein, ur: NEGATIVE mg/dL
Specific Gravity, Urine: 1.013 (ref 1.005–1.030)
pH: 6 (ref 5.0–8.0)

## 2022-08-17 LAB — CBC
HCT: 37.1 % (ref 36.0–46.0)
Hemoglobin: 12.3 g/dL (ref 12.0–15.0)
MCH: 28.7 pg (ref 26.0–34.0)
MCHC: 33.2 g/dL (ref 30.0–36.0)
MCV: 86.7 fL (ref 80.0–100.0)
Platelets: 189 10*3/uL (ref 150–400)
RBC: 4.28 MIL/uL (ref 3.87–5.11)
RDW: 13 % (ref 11.5–15.5)
WBC: 14.3 10*3/uL — ABNORMAL HIGH (ref 4.0–10.5)
nRBC: 0 % (ref 0.0–0.2)

## 2022-08-17 MED ORDER — IOHEXOL 350 MG/ML SOLN
75.0000 mL | Freq: Once | INTRAVENOUS | Status: AC | PRN
  Administered 2022-08-17: 75 mL via INTRAVENOUS

## 2022-08-17 MED ORDER — POTASSIUM CHLORIDE CRYS ER 20 MEQ PO TBCR
40.0000 meq | EXTENDED_RELEASE_TABLET | Freq: Once | ORAL | Status: AC
  Administered 2022-08-17: 40 meq via ORAL
  Filled 2022-08-17: qty 2

## 2022-08-17 MED ORDER — SODIUM CHLORIDE 0.9 % IV SOLN
1.0000 g | Freq: Once | INTRAVENOUS | Status: AC
  Administered 2022-08-17: 1 g via INTRAVENOUS
  Filled 2022-08-17: qty 10

## 2022-08-17 NOTE — ED Provider Notes (Signed)
[place] EMERGENCY DEPARTMENT Provider Note   CSN: [id number] Arrival date & time: 08/17/22  1719     History  No chief complaint on file.   Gina Mcgee is a 88 y.o. female.  With past medical history significant for HTN, prior breast cancer, reported bladder cancer, blood clot on Eliquis, reported A-fib on metoprolol presenting with concerns for persistent, productive cough and behavior changes.  Patient was evaluated in urgent care yesterday for her productive cough.  At that time an x-ray was taken and it did not show any signs of pneumonia.  I personally reviewed this x-ray and agree that it was not consistent with pneumonia at that time.  Patient was discharged with [facility].  COVID swab also taken this time was negative.  Patient's aide reports that during the day patient had an episode of being more agitated than is normal for her.  Patient is normally quite pleasant at baseline.  She had also some episodes of seeing "numbers on her pants" that they thought was perhaps her seeing her solitaire cards and places they were not.  Per EMS, on their arrival patient was 90% on room air at rest.  She arrived on 3 L nasal cannula.  Patient at this time endorses cough.  She otherwise has no current complaints.  She denies chest pain, shortness of breath at rest, fever, nausea, vomiting.       Home Medications Prior to Admission medications   Medication Sig Start Date End Date Taking? Authorizing Provider  apixaban (ELIQUIS) 2.5 MG TABS tablet Take 1 tablet (2.5 mg total) by mouth 2 (two) times daily. 06/17/22  Yes BranchRoyetta Crochet, MD  benzonatate (TESSALON) 100 MG capsule Take 1 capsule (100 mg total) by mouth 3 (three) times daily as needed for cough. 08/16/22  Yes Jaynee Eagles, PA-C  Cholecalciferol (VITAMIN D3) 10 MCG (400 UNIT) CAPS Take 400 Units by mouth in the morning, at noon, in the evening, and at bedtime.   Yes [provider]   hydrochlorothiazide (HYDRODIURIL) 12.5 MG tablet Take 12.5 mg by mouth daily.   Yes [provider]  memantine (NAMENDA) 10 MG tablet Take 10 mg by mouth daily.   Yes [provider]  metoprolol succinate (TOPROL XL) 25 MG 24 hr tablet Take 1 tablet (25 mg total) by mouth daily. 06/25/22  Yes BranchRoyetta Crochet, MD  senna-docusate (SENOKOT-S) 8.6-50 MG tablet Take 1 tablet by mouth daily.   Yes [provider]  ketoconazole (NIZORAL) 2 % cream Apply 1 Application topically at bedtime. Apply to toes    [provider]      Allergies    Aspirin, Doxycycline, Fish allergy, Nsaids, Aloe, and Sulfa antibiotics    Review of Systems   Review of Systems  Physical Exam Updated Vital Signs BP 130/67 (BP Location: Right Arm)   Pulse 84   Temp 99.6 F (37.6 C) (Oral)   Resp (!) 22   SpO2 95%  Physical Exam Constitutional:      General: She is not in acute distress. HENT:     Head: Normocephalic and atraumatic.     Nose: Nose normal.     Mouth/Throat:     Mouth: Mucous membranes are moist.     Pharynx: Oropharynx is clear.  Cardiovascular:     Rate and Rhythm: Normal rate. Rhythm irregular.  Pulmonary:     Effort: Pulmonary effort is normal.     Breath sounds: No wheezing.  Comments: Slight rhonchi in bases bilaterally.  Effort normal at rest, however even with adjusting in bed, patient has significant increased work of breathing Abdominal:     General: There is no distension.     Tenderness: There is no abdominal tenderness. There is no guarding or rebound.  Musculoskeletal:        General: No tenderness.     Right lower leg: No edema.     Left lower leg: No edema.  Skin:    General: Skin is warm and dry.  Neurological:     Mental Status: She is alert. Mental status is at baseline.     ED Results / Procedures / Treatments   Labs (all labs ordered are listed, but only abnormal results are displayed) Labs Reviewed  CBC - Abnormal; Notable  for the following components:      Result Value   WBC 14.3 (*)    All other components within normal limits  COMPREHENSIVE METABOLIC PANEL - Abnormal; Notable for the following components:   Sodium 134 (*)    Potassium 2.9 (*)    Chloride 96 (*)    Glucose, Bld 129 (*)    All other components within normal limits  URINALYSIS, ROUTINE W REFLEX MICROSCOPIC - Abnormal; Notable for the following components:   Color, Urine STRAW (*)    Hgb urine dipstick SMALL (*)    All other components within normal limits  I-STAT VENOUS BLOOD GAS, ED - Abnormal; Notable for the following components:   pH, Ven 7.470 (*)    pCO2, Ven 39.6 (*)    Bicarbonate 28.8 (*)    Acid-Base Excess 5.0 (*)    Sodium 134 (*)    Potassium 3.1 (*)    Calcium, Ion 1.12 (*)    All other components within normal limits  RESPIRATORY PANEL BY PCR  HIV ANTIBODY (ROUTINE TESTING W REFLEX)  LEGIONELLA PNEUMOPHILA SEROGP 1 UR AG  STREP PNEUMONIAE URINARY ANTIGEN  CBC WITH DIFFERENTIAL/PLATELET  COMPREHENSIVE METABOLIC PANEL  CBG MONITORING, ED    EKG None  Radiology CT Angio Chest Pulmonary Embolism (PE) W or WO Contrast  Result Date: 08/17/2022 CLINICAL DATA:  Pulmonary embolism suspected, high probability. EXAM: CT ANGIOGRAPHY CHEST WITH CONTRAST TECHNIQUE: Multidetector CT imaging of the chest was performed using the standard protocol during bolus administration of intravenous contrast. Multiplanar CT image reconstructions and MIPs were obtained to evaluate the vascular anatomy. RADIATION DOSE REDUCTION: This exam was performed according to the departmental dose-optimization program which includes automated exposure control, adjustment of the mA and/or kV according to patient size and/or use of iterative reconstruction technique. CONTRAST:  95m OMNIPAQUE IOHEXOL 350 MG/ML SOLN COMPARISON:  03/25/2021. FINDINGS: Cardiovascular: The heart is enlarged and there is a trace pericardial effusion. Scattered coronary artery  calcifications are noted. There is atherosclerotic calcification of the aorta without evidence of aneurysm. Pulmonary trunk is normal in caliber. No pulmonary artery filling defect is identified. Mediastinum/Nodes: No mediastinal, hilar, or axillary lymphadenopathy. A hypodensity is present in the right lobe of the thyroid gland measuring 7 mm. No follow-up imaging is recommended. The trachea and esophagus are within normal limits. There is a large hiatal hernia. Lungs/Pleura: There is apical pleural thickening bilaterally, greater on the left than on the right with a few calcifications. Scattered airspace disease is noted in the left upper and lower lobes. There is a trace left pleural effusion. No pneumothorax. Upper Abdomen: No acute abnormality. Musculoskeletal: There is kyphosis of the midthoracic spine with multiple compression  deformities, unchanged from 03/25/2021. Multilevel degenerative changes and degenerative disc disease are noted. Review of the MIP images confirms the above findings. IMPRESSION: 1. No evidence of pulmonary embolism. 2. Scattered airspace disease in the left upper and lower lobes, concerning for infiltrate. Short-term follow-up is recommended to exclude the possibility of underlying nodules. 3. Bilateral apical pleural scarring, greater on the left than on the right. Attention on follow-up is recommended. 4. Small left pleural effusion. 5. Multilevel degenerative changes and compression deformities in the thoracic spine with kyphosis. 6. The heart hiatal hernia. 7. Aortic atherosclerosis and scattered coronary artery calcifications. Electronically Signed   By: Brett Fairy M.D.   On: 08/17/2022 22:14   DG Chest 2 View  Result Date: 08/16/2022 CLINICAL DATA:  Cough. EXAM: CHEST - 2 VIEW COMPARISON:  03/25/2021 FINDINGS: Lungs are hyperexpanded with increased AP diameter of the chest and focal kyphosis of the midthoracic spine secondary to compression fractures. The lungs are clear  without focal pneumonia, edema, pneumothorax or pleural effusion. Cardiopericardial silhouette is at upper limits of normal for size. Small to moderate hiatal hernia noted. Bones are diffusely demineralized. IMPRESSION: 1. Hyperexpansion without acute cardiopulmonary findings. 2. Small to moderate hiatal hernia. Electronically Signed   By: Misty Stanley M.D.   On: 08/16/2022 14:56    Procedures Procedures    Medications Ordered in ED Medications  cefTRIAXone (ROCEPHIN) 2 g in sodium chloride 0.9 % 100 mL IVPB (has no administration in time range)  azithromycin (ZITHROMAX) 500 mg in sodium chloride 0.9 % 250 mL IVPB (has no administration in time range)  potassium chloride SA (KLOR-CON M) CR tablet 40 mEq (40 mEq Oral Given 08/17/22 2011)  iohexol (OMNIPAQUE) 350 MG/ML injection 75 mL (75 mLs Intravenous Contrast Given 08/17/22 2155)  cefTRIAXone (ROCEPHIN) 1 g in sodium chloride 0.9 % 100 mL IVPB (0 g Intravenous Stopped 08/17/22 2325)    ED Course/ Medical Decision Making/ A&P                           Medical Decision Making Patient presents as above.  She takes metoprolol for her A-fib.  Due to her hypoxia and being beta blocked, will pursue CT PE study to rule out PE.  With COVID swab and chest x-ray performed yesterday, will not repeat these.  VBG, CBC, CMP, EKG ordered.  Due to patient's possible mental status changes, UA ordered.  Amount and/or Complexity of Data Reviewed Independent Historian: caregiver    Details: And adult child Labs: ordered. Decision-making details documented in ED Course. Radiology: ordered and independent interpretation performed. Decision-making details documented in ED Course. ECG/medicine tests: ordered and independent interpretation performed. Decision-making details documented in ED Course.  Risk Prescription drug management. Decision regarding hospitalization.   On chart review, patient received a CT scan with IV contrast (Omnipaque 350) at [place].  No documented allergic reaction.  She does have a documented allergic reaction to gadolinium containing contrast.  She is able to receive Omnipaque 350.  She did not have any reaction to the CT scan today.  Patient's VBG notable for slight alkalosis at 7.47, PCO2 39.6.  Patient's UA overall unremarkable.  CBC with leukocytosis at 14.3.  CMP notable for hypokalemia at 2.9.  Repletion ordered.  LFTs, creatinine within normal limits.  Sodium very slightly low at 134.  CT PE study negative for thromboembolic disease, however patient does have some lung disease noted in the left lobe.  IV ceftriaxone ordered.  Following ambulation to the bathroom, patient had desaturation to 86%.  She required 2 L nasal cannula to improve back to 90s.  Due to pneumonia with new oxygen requirement, she requires admission.  I discussed the patient with the admitting team.  Patient will be admitted to hospitalist service.          Final Clinical Impression(s) / ED Diagnoses Final diagnoses:  Pneumonia due to infectious organism, unspecified laterality, unspecified part of lung    Rx / DC Orders ED Discharge Orders     None         Luster Landsberg, MD 08/18/22 0124    Teressa Lower, MD 08/18/22 1320

## 2022-08-17 NOTE — H&P (Incomplete)
History and Physical    Gina Mcgee SWF:[id number] DOB: [birth year] DOA: 08/17/2022  PCP: Vernie Shanks, MD (Inactive)  Patient coming from: ***  I have personally briefly reviewed patient's old medical records in [place]  Chief Complaint: ***  HPI: Gina Mcgee is a 88 y.o. female with medical history significant of    ED Course: ***  Review of Systems: As per HPI otherwise 10 point review of systems negative.   Past Medical History:  Diagnosis Date  . A-fib ([place])   . Brain concussion 09/22/1958  . Cancer [facility])    left-sided breast cancer  . Concussion 09/22/1958   with left-sided neuro defecits  . HTN (hypertension)   . Osteoporosis     Past Surgical History:  Procedure Laterality Date  . BOWEL RESECTION  approx. in 2007   polyp and formed stricture  . BREAST LUMPECTOMY  1981   left lumpectomy w/ radiation  . COLONOSCOPY W/ POLYPECTOMY    . FEMUR SURGERY  2011   left femur pinning  . JOINT REPLACEMENT    . ORIF HUMERUS FRACTURE Left 01/25/2013   Procedure: OPEN REDUCTION INTERNAL FIXATION (ORIF) DISTAL HUMERUS FRACTURE REPAIR RECONSTRUCTION AND ULNA NERVE DECOMPRESSION AND ANTERIOR TRANSPOSITION AS NECESSARY;  Surgeon: Roseanne Kaufman, MD;  Location: [facility];  Service: Orthopedics;  Laterality: Left;     reports that she has never smoked. She has never used smokeless tobacco. She reports that she does not currently use alcohol. She reports that she does not use drugs.  Allergies  Allergen Reactions  . Aspirin Other (See Comments)    Gets jitter with large doses  . Doxycycline Other (See Comments)    Intestines were affected.  . Fish Allergy Nausea Only and Other (See Comments)    "makes me jumpy and twitchy, but grand children are allergic"   . Nsaids     Can take for short periods of time. Naprosyn -feels dazed  . Aloe Rash  . Sulfa Antibiotics Rash    Family History  Problem Relation Age of Onset  . Stroke Mother   . Lung cancer Father    . Stroke Father    *** Prior to Admission medications   Medication Sig Start Date End Date Taking? Authorizing Provider  apixaban (ELIQUIS) 2.5 MG TABS tablet Take 1 tablet (2.5 mg total) by mouth 2 (two) times daily. 06/17/22   Janina Mayo, MD  benzonatate (TESSALON) 100 MG capsule Take 1 capsule (100 mg total) by mouth 3 (three) times daily as needed for cough. 08/16/22   Jaynee Eagles, PA-C  Cholecalciferol (VITAMIN D3) 1000 UNITS CAPS Take 1,600 Units by mouth.    [provider]  hydrochlorothiazide (HYDRODIURIL) 12.5 MG tablet Take 12.5 mg by mouth daily.    [provider]  ketoconazole (NIZORAL) 2 % cream     [provider]  memantine (NAMENDA) 5 MG tablet Take 10 mg by mouth daily.    [provider]  metoprolol succinate (TOPROL XL) 25 MG 24 hr tablet Take 1 tablet (25 mg total) by mouth daily. 06/25/22   Janina Mayo, MD  senna-docusate (SENOKOT-S) 8.6-50 MG tablet Take by mouth.    [provider]    Physical Exam: Vitals:   08/17/22 1740 08/17/22 2204  BP: (!) 146/108 130/67  Pulse: 89 84  Resp: 18 (!) 22  Temp: 99.6 F (37.6 C)   TempSrc: Oral   SpO2: 93% 95%    Constitutional: NAD, calm, comfortable  Vitals:   08/17/22 1740 08/17/22 2204  BP: (!) 146/108 130/67  Pulse: 89 84  Resp: 18 (!) 22  Temp: 99.6 F (37.6 C)   TempSrc: Oral   SpO2: 93% 95%   Eyes: PERRL, lids and conjunctivae normal ENMT: Mucous membranes are moist. Posterior pharynx clear of any exudate or lesions.Normal dentition.  Neck: normal, supple, no masses, no thyromegaly Respiratory: clear to auscultation bilaterally, no wheezing, no crackles. Normal respiratory effort. No accessory muscle use.  Cardiovascular: Regular rate and rhythm, no murmurs / rubs / gallops. No extremity edema. 2+ pedal pulses. No carotid bruits.  Abdomen: no tenderness, no masses palpated. No hepatosplenomegaly. Bowel sounds positive.  Musculoskeletal: no clubbing /  cyanosis. No joint deformity upper and lower extremities. Good ROM, no contractures. Normal muscle tone.  Skin: no rashes, lesions, ulcers. No induration Neurologic: CN 2-12 grossly intact. Sensation intact, DTR normal. Strength 5/5 in all 4.  Psychiatric: Normal judgment and insight. Alert and oriented x 3. Normal mood.    Labs on Admission: I have personally reviewed following labs and imaging studies  CBC: Recent Labs  Lab 08/17/22 1817 08/17/22 1923  WBC 14.3*  --   HGB 12.3 12.6  HCT 37.1 37.0  MCV 86.7  --   PLT 189  --    Basic Metabolic Panel: Recent Labs  Lab 08/17/22 1817 08/17/22 1923  NA 134* 134*  K 2.9* 3.1*  CL 96*  --   CO2 25  --   GLUCOSE 129*  --   BUN 20  --   CREATININE 0.91  --   CALCIUM 9.1  --    GFR: CrCl cannot be calculated (Unknown ideal weight.). Liver Function Tests: Recent Labs  Lab 08/17/22 1817  AST 33  ALT 16  ALKPHOS 85  BILITOT 0.6  PROT 7.1  ALBUMIN 3.7   No results for input(s): "LIPASE", "AMYLASE" in the last 168 hours. No results for input(s): "AMMONIA" in the last 168 hours. Coagulation Profile: No results for input(s): "INR", "PROTIME" in the last 168 hours. Cardiac Enzymes: No results for input(s): "CKTOTAL", "CKMB", "CKMBINDEX", "TROPONINI" in the last 168 hours. BNP (last 3 results) No results for input(s): "PROBNP" in the last 8760 hours. HbA1C: No results for input(s): "HGBA1C" in the last 72 hours. CBG: No results for input(s): "GLUCAP" in the last 168 hours. Lipid Profile: No results for input(s): "CHOL", "HDL", "LDLCALC", "TRIG", "CHOLHDL", "LDLDIRECT" in the last 72 hours. Thyroid Function Tests: No results for input(s): "TSH", "T4TOTAL", "FREET4", "T3FREE", "THYROIDAB" in the last 72 hours. Anemia Panel: No results for input(s): "VITAMINB12", "FOLATE", "FERRITIN", "TIBC", "IRON", "RETICCTPCT" in the last 72 hours. Urine analysis:    Component Value Date/Time   COLORURINE STRAW (A) 08/17/2022 1847    APPEARANCEUR CLEAR 08/17/2022 1847   LABSPEC 1.013 08/17/2022 1847   PHURINE 6.0 08/17/2022 1847   GLUCOSEU NEGATIVE 08/17/2022 1847   HGBUR SMALL (A) 08/17/2022 1847   BILIRUBINUR NEGATIVE 08/17/2022 1847   KETONESUR NEGATIVE 08/17/2022 1847   PROTEINUR NEGATIVE 08/17/2022 1847   NITRITE NEGATIVE 08/17/2022 1847   LEUKOCYTESUR NEGATIVE 08/17/2022 1847    Radiological Exams on Admission: CT Angio Chest Pulmonary Embolism (PE) W or WO Contrast  Result Date: 08/17/2022 CLINICAL DATA:  Pulmonary embolism suspected, high probability. EXAM: CT ANGIOGRAPHY CHEST WITH CONTRAST TECHNIQUE: Multidetector CT imaging of the chest was performed using the standard protocol during bolus administration of intravenous contrast. Multiplanar CT image reconstructions and MIPs were obtained to evaluate the vascular anatomy. RADIATION DOSE  REDUCTION: This exam was performed according to the departmental dose-optimization program which includes automated exposure control, adjustment of the mA and/or kV according to patient size and/or use of iterative reconstruction technique. CONTRAST:  73m OMNIPAQUE IOHEXOL 350 MG/ML SOLN COMPARISON:  03/25/2021. FINDINGS: Cardiovascular: The heart is enlarged and there is a trace pericardial effusion. Scattered coronary artery calcifications are noted. There is atherosclerotic calcification of the aorta without evidence of aneurysm. Pulmonary trunk is normal in caliber. No pulmonary artery filling defect is identified. Mediastinum/Nodes: No mediastinal, hilar, or axillary lymphadenopathy. A hypodensity is present in the right lobe of the thyroid gland measuring 7 mm. No follow-up imaging is recommended. The trachea and esophagus are within normal limits. There is a large hiatal hernia. Lungs/Pleura: There is apical pleural thickening bilaterally, greater on the left than on the right with a few calcifications. Scattered airspace disease is noted in the left upper and lower lobes. There  is a trace left pleural effusion. No pneumothorax. Upper Abdomen: No acute abnormality. Musculoskeletal: There is kyphosis of the midthoracic spine with multiple compression deformities, unchanged from 03/25/2021. Multilevel degenerative changes and degenerative disc disease are noted. Review of the MIP images confirms the above findings. IMPRESSION: 1. No evidence of pulmonary embolism. 2. Scattered airspace disease in the left upper and lower lobes, concerning for infiltrate. Short-term follow-up is recommended to exclude the possibility of underlying nodules. 3. Bilateral apical pleural scarring, greater on the left than on the right. Attention on follow-up is recommended. 4. Small left pleural effusion. 5. Multilevel degenerative changes and compression deformities in the thoracic spine with kyphosis. 6. The heart hiatal hernia. 7. Aortic atherosclerosis and scattered coronary artery calcifications. Electronically Signed   By: LBrett FairyM.D.   On: 08/17/2022 22:14   DG Chest 2 View  Result Date: 08/16/2022 CLINICAL DATA:  Cough. EXAM: CHEST - 2 VIEW COMPARISON:  03/25/2021 FINDINGS: Lungs are hyperexpanded with increased AP diameter of the chest and focal kyphosis of the midthoracic spine secondary to compression fractures. The lungs are clear without focal pneumonia, edema, pneumothorax or pleural effusion. Cardiopericardial silhouette is at upper limits of normal for size. Small to moderate hiatal hernia noted. Bones are diffusely demineralized. IMPRESSION: 1. Hyperexpansion without acute cardiopulmonary findings. 2. Small to moderate hiatal hernia. Electronically Signed   By: EMisty StanleyM.D.   On: 08/16/2022 14:56    EKG: Independently reviewed. ***  Assessment/Plan Principal Problem:   CAP (community acquired pneumonia)   ***  DVT prophylaxis: *** (Lovenox/Heparin/SCD's/anticoagulated/None (if comfort care) Code Status: *** (Full/Partial (specify details) Family Communication: ***  (Specify name, relationship. Do not write "discussed with patient". Specify tel # if discussed over the phone) Disposition Plan: *** (specify when and where you expect patient to be discharged) Consults called: *** (with names) Admission status: *** (inpatient / obs / tele / medical floor / SDU)   SClance BollMD [facility] Pager [phone]  If 7PM-7AM, please contact night-coverage [email or web address] Password [email or web address] 08/17/2022, 11:59 PM

## 2022-08-17 NOTE — ED Notes (Signed)
Pt's sats are 86-89% on RA. Pt went to bathroom with walker and was weak. Needed 2 person assist. Pt was placed back in bed and placed on 2 L [place].

## 2022-08-17 NOTE — H&P (Signed)
History and Physical    Gina Mcgee YKZ:[id number] DOB: 03-23-1930 DOA: 08/17/2022  PCP: Vernie Shanks, MD (Inactive)  Patient coming from: home  I have personally briefly reviewed patient's old medical records in [place]  Chief Complaint: 3 days congestion/cough  HPI: Gina Mcgee is a 88 y.o. female with medical history significant of  Afib on ELiquis, Left BRCA ,  left brachial plexus neuropathy  Essential hypertension, osteoporosis, bladder ca s/p TURBT 3/22 who presents to ED with 3 days of cough /congestion and episode of confusion. Of note patient seen in UC on 11/25 with negative respiratory panel and was sent home with supportive care for URI.  Patient symptoms progressed however and due to this presents to ED. Patient currently denies any chest pain, n/v/d/ diarrhea/ dysuria, notes persistent productive cough.   ED Course:  In ED on evaluation patient noted to have hypoxic episode to mid 80's w/o recovery and required placement on supplemental O2 2L with sat improving to mid 90's.  CXR notable for infiltrate. Tx:99.6, BP 146/108, hr 89, rr 18 -22 sat 93% on 2L  Labs Wbc 14.3, hgb 12.3, plt 189 NA 134(134), K 2.9 repeat 3.1, glu 129, cr 0.91 UA: neg Vbg: 7.47, po2 45 EKG: Nsr with pac, QT 488 CTPA IMPRESSION: 1. No evidence of pulmonary embolism. 2. Scattered airspace disease in the left upper and lower lobes, concerning for infiltrate. Short-term follow-up is recommended to exclude the possibility of underlying nodules. 3. Bilateral apical pleural scarring, greater on the left than on the right. Attention on follow-up is recommended. 4. Small left pleural effusion. 5. Multilevel degenerative changes and compression deformities in the thoracic spine with kyphosis. 6. The heart hiatal hernia. 7. Aortic atherosclerosis and scattered coronary artery calcifications.  TX ceftriaxone Review of Systems: As per HPI otherwise 10 point review of systems  negative.   Past Medical History:  Diagnosis Date   A-fib [facility])    Brain concussion 09/22/1958   Cancer [facility])    left-sided breast cancer   Concussion 09/22/1958   with left-sided neuro defecits   HTN (hypertension)    Osteoporosis     Past Surgical History:  Procedure Laterality Date   BOWEL RESECTION  approx. in 2007   polyp and formed stricture   BREAST LUMPECTOMY  1981   left lumpectomy w/ radiation   COLONOSCOPY W/ POLYPECTOMY     FEMUR SURGERY  2011   left femur pinning   JOINT REPLACEMENT     ORIF HUMERUS FRACTURE Left 01/25/2013   Procedure: OPEN REDUCTION INTERNAL FIXATION (ORIF) DISTAL HUMERUS FRACTURE REPAIR RECONSTRUCTION AND ULNA NERVE DECOMPRESSION AND ANTERIOR TRANSPOSITION AS NECESSARY;  Surgeon: Roseanne Kaufman, MD;  Location: [facility];  Service: Orthopedics;  Laterality: Left;     reports that she has never smoked. She has never used smokeless tobacco. She reports that she does not currently use alcohol. She reports that she does not use drugs.  Allergies  Allergen Reactions   Aspirin Other (See Comments)    Gets jitter with large doses   Doxycycline Other (See Comments)    Intestines were affected.   Fish Allergy Nausea Only and Other (See Comments)    "makes me jumpy and twitchy, but grand children are allergic"    Nsaids     Can take for short periods of time. Naprosyn -feels dazed   Aloe Rash   Sulfa Antibiotics Rash    Family History  Problem Relation Age of Onset   Stroke  Mother    Lung cancer Father    Stroke Father     Prior to Admission medications   Medication Sig Start Date End Date Taking? Authorizing Provider  apixaban (ELIQUIS) 2.5 MG TABS tablet Take 1 tablet (2.5 mg total) by mouth 2 (two) times daily. 06/17/22   Janina Mayo, MD  benzonatate (TESSALON) 100 MG capsule Take 1 capsule (100 mg total) by mouth 3 (three) times daily as needed for cough. 08/16/22   Jaynee Eagles, PA-C  Cholecalciferol (VITAMIN D3) 1000 UNITS CAPS Take 1,600  Units by mouth.    [provider]  hydrochlorothiazide (HYDRODIURIL) 12.5 MG tablet Take 12.5 mg by mouth daily.    [provider]  ketoconazole (NIZORAL) 2 % cream     [provider]  memantine (NAMENDA) 5 MG tablet Take 10 mg by mouth daily.    [provider]  metoprolol succinate (TOPROL XL) 25 MG 24 hr tablet Take 1 tablet (25 mg total) by mouth daily. 06/25/22   Janina Mayo, MD  senna-docusate (SENOKOT-S) 8.6-50 MG tablet Take by mouth.    [provider]    Physical Exam: Vitals:   08/17/22 1740 08/17/22 2204  BP: (!) 146/108 130/67  Pulse: 89 84  Resp: 18 (!) 22  Temp: 99.6 F (37.6 C)   TempSrc: Oral   SpO2: 93% 95%    Constitutional: NAD, calm, comfortable Vitals:   08/17/22 1740 08/17/22 2204  BP: (!) 146/108 130/67  Pulse: 89 84  Resp: 18 (!) 22  Temp: 99.6 F (37.6 C)   TempSrc: Oral   SpO2: 93% 95%   Eyes: PERRL, lids and conjunctivae normal ENMT: Mucous membranes are moist. Posterior pharynx clear of any exudate or lesions.Normal dentition.  Neck: normal, supple, no masses, no thyromegaly Respiratory: bilaterally rhonchi, no wheezing,crackles. Normal respiratory effort. No accessory muscle use.  Cardiovascular: Regular rate and rhythm, no murmurs / rubs / gallops. No extremity edema. 2+ pedal pulses. Abdomen: no tenderness, no masses palpated. No hepatosplenomegaly. Bowel sounds positive.  Musculoskeletal: no clubbing / cyanosis. No joint deformity upper and lower extremities. Good ROM, no contractures. Normal muscle tone.  Skin: no rashes, lesions, ulcers. No induration Neurologic: CN 2-12 grossly intact. Sensation intact,  Strength normal other than left upper ext 4/5  Psychiatric: Normal judgment and insight. Alert and oriented x 3. Normal mood.    Labs on Admission: I have personally reviewed following labs and imaging studies  CBC: Recent Labs  Lab 08/17/22 1817 08/17/22 1923  WBC 14.3*  --   HGB  12.3 12.6  HCT 37.1 37.0  MCV 86.7  --   PLT 189  --    Basic Metabolic Panel: Recent Labs  Lab 08/17/22 1817 08/17/22 1923  NA 134* 134*  K 2.9* 3.1*  CL 96*  --   CO2 25  --   GLUCOSE 129*  --   BUN 20  --   CREATININE 0.91  --   CALCIUM 9.1  --    GFR: CrCl cannot be calculated (Unknown ideal weight.). Liver Function Tests: Recent Labs  Lab 08/17/22 1817  AST 33  ALT 16  ALKPHOS 85  BILITOT 0.6  PROT 7.1  ALBUMIN 3.7   No results for input(s): "LIPASE", "AMYLASE" in the last 168 hours. No results for input(s): "AMMONIA" in the last 168 hours. Coagulation Profile: No results for input(s): "INR", "PROTIME" in the last 168 hours. Cardiac Enzymes: No results for input(s): "CKTOTAL", "CKMB", "CKMBINDEX", "TROPONINI" in the  last 168 hours. BNP (last 3 results) No results for input(s): "PROBNP" in the last 8760 hours. HbA1C: No results for input(s): "HGBA1C" in the last 72 hours. CBG: No results for input(s): "GLUCAP" in the last 168 hours. Lipid Profile: No results for input(s): "CHOL", "HDL", "LDLCALC", "TRIG", "CHOLHDL", "LDLDIRECT" in the last 72 hours. Thyroid Function Tests: No results for input(s): "TSH", "T4TOTAL", "FREET4", "T3FREE", "THYROIDAB" in the last 72 hours. Anemia Panel: No results for input(s): "VITAMINB12", "FOLATE", "FERRITIN", "TIBC", "IRON", "RETICCTPCT" in the last 72 hours. Urine analysis:    Component Value Date/Time   COLORURINE STRAW (A) 08/17/2022 1847   APPEARANCEUR CLEAR 08/17/2022 1847   LABSPEC 1.013 08/17/2022 1847   PHURINE 6.0 08/17/2022 1847   GLUCOSEU NEGATIVE 08/17/2022 1847   HGBUR SMALL (A) 08/17/2022 1847   BILIRUBINUR NEGATIVE 08/17/2022 1847   KETONESUR NEGATIVE 08/17/2022 1847   PROTEINUR NEGATIVE 08/17/2022 1847   NITRITE NEGATIVE 08/17/2022 1847   LEUKOCYTESUR NEGATIVE 08/17/2022 1847    Radiological Exams on Admission: CT Angio Chest Pulmonary Embolism (PE) W or WO Contrast  Result Date:  08/17/2022 CLINICAL DATA:  Pulmonary embolism suspected, high probability. EXAM: CT ANGIOGRAPHY CHEST WITH CONTRAST TECHNIQUE: Multidetector CT imaging of the chest was performed using the standard protocol during bolus administration of intravenous contrast. Multiplanar CT image reconstructions and MIPs were obtained to evaluate the vascular anatomy. RADIATION DOSE REDUCTION: This exam was performed according to the departmental dose-optimization program which includes automated exposure control, adjustment of the mA and/or kV according to patient size and/or use of iterative reconstruction technique. CONTRAST:  88m OMNIPAQUE IOHEXOL 350 MG/ML SOLN COMPARISON:  03/25/2021. FINDINGS: Cardiovascular: The heart is enlarged and there is a trace pericardial effusion. Scattered coronary artery calcifications are noted. There is atherosclerotic calcification of the aorta without evidence of aneurysm. Pulmonary trunk is normal in caliber. No pulmonary artery filling defect is identified. Mediastinum/Nodes: No mediastinal, hilar, or axillary lymphadenopathy. A hypodensity is present in the right lobe of the thyroid gland measuring 7 mm. No follow-up imaging is recommended. The trachea and esophagus are within normal limits. There is a large hiatal hernia. Lungs/Pleura: There is apical pleural thickening bilaterally, greater on the left than on the right with a few calcifications. Scattered airspace disease is noted in the left upper and lower lobes. There is a trace left pleural effusion. No pneumothorax. Upper Abdomen: No acute abnormality. Musculoskeletal: There is kyphosis of the midthoracic spine with multiple compression deformities, unchanged from 03/25/2021. Multilevel degenerative changes and degenerative disc disease are noted. Review of the MIP images confirms the above findings. IMPRESSION: 1. No evidence of pulmonary embolism. 2. Scattered airspace disease in the left upper and lower lobes, concerning for  infiltrate. Short-term follow-up is recommended to exclude the possibility of underlying nodules. 3. Bilateral apical pleural scarring, greater on the left than on the right. Attention on follow-up is recommended. 4. Small left pleural effusion. 5. Multilevel degenerative changes and compression deformities in the thoracic spine with kyphosis. 6. The heart hiatal hernia. 7. Aortic atherosclerosis and scattered coronary artery calcifications. Electronically Signed   By: LBrett FairyM.D.   On: 08/17/2022 22:14   DG Chest 2 View  Result Date: 08/16/2022 CLINICAL DATA:  Cough. EXAM: CHEST - 2 VIEW COMPARISON:  03/25/2021 FINDINGS: Lungs are hyperexpanded with increased AP diameter of the chest and focal kyphosis of the midthoracic spine secondary to compression fractures. The lungs are clear without focal pneumonia, edema, pneumothorax or pleural effusion. Cardiopericardial silhouette is at upper limits of  normal for size. Small to moderate hiatal hernia noted. Bones are diffusely demineralized. IMPRESSION: 1. Hyperexpansion without acute cardiopulmonary findings. 2. Small to moderate hiatal hernia. Electronically Signed   By: Misty Stanley M.D.   On: 08/16/2022 14:56    EKG: Independently reviewed. See above   Assessment/Plan  CAP with associated acute hypoxic respiratory failure  -presumed bacterial, viral panel neg  - start ctx,azithromycin  -f/u on blood cultures, urinary ag  -pulmonary toilet  -wean O2 as able   Afib  -currently sinus with PAC on EKG  - continue metoprolol, Eliquis   Left BRCA -in remission   Brachial plexus neuropathy -no active issues   Essential hypertension  -current bp stable  -resume home regimen   Hx of Bladder CA -s/p TURBT  3/22/followed by BCG therapy -followed by urology   Osteoporosis -no active issues   DVT prophylaxis: Eliquis Code Status: full Family Communication: DTR at bedside Disposition Plan: patient  expected to be admitted greater  than 2 midnights  Hampton,Katherine (Daughter) [phone] (Mobile)  Consults called: n/a Admission status:  progressive care    Clance Boll MD [facility]   If 7PM-7AM, please contact night-coverage [email or web address] Password [facility]  08/17/2022, 11:59 PM

## 2022-08-17 NOTE — ED Triage Notes (Signed)
Patient arrived by [facility] from home. Patient has hx of dementia and got aggressive with care giver today. Had cxr yesterday and was read out normal per family. Patient arrived on oxygen at 3l with sat 98% and was 90 on RA. Alert and friendly on arrival, no complaints. Family reports cough x 3 days

## 2022-08-18 DIAGNOSIS — J121 Respiratory syncytial virus pneumonia: Secondary | ICD-10-CM

## 2022-08-18 DIAGNOSIS — J9691 Respiratory failure, unspecified with hypoxia: Secondary | ICD-10-CM

## 2022-08-18 DIAGNOSIS — C679 Malignant neoplasm of bladder, unspecified: Secondary | ICD-10-CM | POA: Insufficient documentation

## 2022-08-18 DIAGNOSIS — G3184 Mild cognitive impairment, so stated: Secondary | ICD-10-CM | POA: Insufficient documentation

## 2022-08-18 HISTORY — DX: Respiratory syncytial virus pneumonia: J12.1

## 2022-08-18 HISTORY — DX: Respiratory failure, unspecified with hypoxia: J96.91

## 2022-08-18 LAB — COMPREHENSIVE METABOLIC PANEL
ALT: 16 U/L (ref 0–44)
AST: 30 U/L (ref 15–41)
Albumin: 3.3 g/dL — ABNORMAL LOW (ref 3.5–5.0)
Alkaline Phosphatase: 74 U/L (ref 38–126)
Anion gap: 11 (ref 5–15)
BUN: 18 mg/dL (ref 8–23)
CO2: 25 mmol/L (ref 22–32)
Calcium: 8.7 mg/dL — ABNORMAL LOW (ref 8.9–10.3)
Chloride: 97 mmol/L — ABNORMAL LOW (ref 98–111)
Creatinine, Ser: 0.9 mg/dL (ref 0.44–1.00)
GFR, Estimated: >60 mL/min (ref >60)
Glucose, Bld: 112 mg/dL — ABNORMAL HIGH (ref 70–99)
Potassium: 3.3 mmol/L — ABNORMAL LOW (ref 3.5–5.1)
Sodium: 133 mmol/L — ABNORMAL LOW (ref 135–145)
Total Bilirubin: 0.7 mg/dL (ref 0.3–1.2)
Total Protein: 6.4 g/dL — ABNORMAL LOW (ref 6.5–8.1)

## 2022-08-18 LAB — CBC WITH DIFFERENTIAL/PLATELET
Abs Immature Granulocytes: 0.03 10*3/uL (ref 0.00–0.07)
Basophils Absolute: 0 10*3/uL (ref 0.0–0.1)
Basophils Relative: 0 %
Eosinophils Absolute: 0 10*3/uL (ref 0.0–0.5)
Eosinophils Relative: 0 %
HCT: 35.7 % — ABNORMAL LOW (ref 36.0–46.0)
Hemoglobin: 11.4 g/dL — ABNORMAL LOW (ref 12.0–15.0)
Immature Granulocytes: 0 %
Lymphocytes Relative: 8 %
Lymphs Abs: 1.2 10*3/uL (ref 0.7–4.0)
MCH: 28.4 pg (ref 26.0–34.0)
MCHC: 31.9 g/dL (ref 30.0–36.0)
MCV: 89 fL (ref 80.0–100.0)
Monocytes Absolute: 0.5 10*3/uL (ref 0.1–1.0)
Monocytes Relative: 4 %
Neutro Abs: 13 10*3/uL — ABNORMAL HIGH (ref 1.7–7.7)
Neutrophils Relative %: 88 %
Platelets: 192 10*3/uL (ref 150–400)
RBC: 4.01 MIL/uL (ref 3.87–5.11)
RDW: 13.1 % (ref 11.5–15.5)
WBC: 14.8 10*3/uL — ABNORMAL HIGH (ref 4.0–10.5)
nRBC: 0 % (ref 0.0–0.2)

## 2022-08-18 LAB — MAGNESIUM: Magnesium: 2.1 mg/dL (ref 1.7–2.4)

## 2022-08-18 LAB — RESPIRATORY PANEL BY PCR

## 2022-08-18 LAB — STREP PNEUMONIAE URINARY ANTIGEN: Strep Pneumo Urinary Antigen: NEGATIVE

## 2022-08-18 LAB — SARS CORONAVIRUS 2 BY RT PCR: SARS Coronavirus 2 by RT PCR: NEGATIVE

## 2022-08-18 LAB — HIV ANTIBODY (ROUTINE TESTING W REFLEX): HIV Screen 4th Generation wRfx: NONREACTIVE

## 2022-08-18 MED ORDER — CHOLECALCIFEROL 10 MCG (400 UNIT) PO TABS
400.0000 [IU] | ORAL_TABLET | Freq: Every day | ORAL | Status: DC
  Administered 2022-08-19 – 2022-08-20 (×2): 400 [IU] via ORAL
  Filled 2022-08-18 (×2): qty 1

## 2022-08-18 MED ORDER — SODIUM CHLORIDE 0.9 % IV SOLN: 2.0000 g | INTRAVENOUS | Status: DC

## 2022-08-18 MED ORDER — SENNOSIDES-DOCUSATE SODIUM 8.6-50 MG PO TABS
1.0000 | ORAL_TABLET | Freq: Every day | ORAL | Status: DC
  Administered 2022-08-18 – 2022-08-20 (×3): 1 via ORAL
  Filled 2022-08-18 (×3): qty 1

## 2022-08-18 MED ORDER — METOPROLOL SUCCINATE ER 25 MG PO TB24
25.0000 mg | ORAL_TABLET | Freq: Every day | ORAL | Status: DC
  Administered 2022-08-18 – 2022-08-20 (×3): 25 mg via ORAL
  Filled 2022-08-18 (×3): qty 1

## 2022-08-18 MED ORDER — APIXABAN 2.5 MG PO TABS
2.5000 mg | ORAL_TABLET | Freq: Two times a day (BID) | ORAL | Status: DC
  Administered 2022-08-18 – 2022-08-20 (×5): 2.5 mg via ORAL
  Filled 2022-08-18 (×5): qty 1

## 2022-08-18 MED ORDER — SODIUM CHLORIDE 0.9 % IV SOLN
500.0000 mg | INTRAVENOUS | Status: DC
  Administered 2022-08-18: 500 mg via INTRAVENOUS
  Filled 2022-08-18: qty 5

## 2022-08-18 NOTE — ED Notes (Signed)
Report given Hildred Alamin, RN in yellow.

## 2022-08-18 NOTE — Progress Notes (Addendum)
PROGRESS NOTE    Gina Mcgee  BEM:[id number] DOB: 07-09-1930 DOA: 08/17/2022 PCP: Vernie Shanks, MD (Inactive)      Brief Narrative:   From admission h and p  Gina Mcgee is a 88 y.o. female with medical history significant of  Afib on ELiquis, Left BRCA ,  left brachial plexus neuropathy  Essential hypertension, osteoporosis, bladder ca s/p TURBT 3/22 who presents to ED with 3 days of cough /congestion and episode of confusion. Of note patient seen in UC on 11/25 with negative respiratory panel and was sent home with supportive care for URI.  Patient symptoms progressed however and due to this presents to ED. Patient currently denies any chest pain, n/v/d/ diarrhea/ dysuria, notes persistent productive cough.   Assessment & Plan:   Principal Problem:   CAP (community acquired pneumonia) Active Problems:   HTN (hypertension)   Bladder cancer ([place])   MCI (mild cognitive impairment)  # RSV pneumonia Several days cough and fatigue. Respiratory viral panel positive for RSV. Covid negative at urgent care on 11/25. No PE on CTA which does show scattered airspace disease in both lungs.  - will repeat covid test to ensure negative  - stop abx - o2 as below  # Acute hypoxic respiratory failure Here 80s on presentation, now breathing comfortably on 2 liters - Welcome O2, wean as able  # HTN Here bp wnl - hold home hctz  # Hypokalemia Likely consequence of hctz - f/u mg and replete - consider bp med substitution - cont home metoprolol  # A-fib Rate controlled - cont home metop and apixaban  # MCI # Delirium With delirium overnight 2/2 acute illness, resolved today - cont home memantine  # Bladder cancer - outpt uro f/u   DVT prophylaxis: home apixaban Code Status: full Family Communication: daughter updated telephonically 11/27  Level of care: Telemetry Medical Status is: Inpatient     Consultants:  none  Procedures: none  Antimicrobials:  S/p  ceftriaxone/azithromycin    Subjective: Feeling fine, denies sob  Objective: Vitals:   08/18/22 0440 08/18/22 0600 08/18/22 1030 08/18/22 1059  BP:  135/75 128/71   Pulse:  82 88   Resp:  20 (!) 21   Temp: 98.7 F (37.1 C)   98.1 F (36.7 C)  TempSrc: Oral   Oral  SpO2:  95% 90%    No intake or output data in the 24 hours ending 08/18/22 1417 There were no vitals filed for this visit.  Examination:  General exam: Appears calm and comfortable  Respiratory system: few scattered rales Cardiovascular system: S1 & S2 heard, RRR. No JVD, murmurs, rubs, gallops or clicks. No pedal edema. Gastrointestinal system: Abdomen is nondistended, soft and nontender. No organomegaly or masses felt. Normal bowel sounds heard. Central nervous system: Alert and oriented. No focal neurological deficits. Extremities: Symmetric 5 x 5 power. Skin: No rashes, lesions or ulcers Psychiatry: confused    Data Reviewed: I have personally reviewed following labs and imaging studies  CBC: Recent Labs  Lab 08/17/22 1817 08/17/22 1923 08/18/22 0458  WBC 14.3*  --  14.8*  NEUTROABS  --   --  13.0*  HGB 12.3 12.6 11.4*  HCT 37.1 37.0 35.7*  MCV 86.7  --  89.0  PLT 189  --  071   Basic Metabolic Panel: Recent Labs  Lab 08/17/22 1817 08/17/22 1923 08/18/22 0458  NA 134* 134* 133*  K 2.9* 3.1* 3.3*  CL 96*  --  97*  CO2 25  --  25  GLUCOSE 129*  --  112*  BUN 20  --  18  CREATININE 0.91  --  0.90  CALCIUM 9.1  --  8.7*   GFR: CrCl cannot be calculated (Unknown ideal weight.). Liver Function Tests: Recent Labs  Lab 08/17/22 1817 08/18/22 0458  AST 33 30  ALT 16 16  ALKPHOS 85 74  BILITOT 0.6 0.7  PROT 7.1 6.4*  ALBUMIN 3.7 3.3*   No results for input(s): "LIPASE", "AMYLASE" in the last 168 hours. No results for input(s): "AMMONIA" in the last 168 hours. Coagulation Profile: No results for input(s): "INR", "PROTIME" in the last 168 hours. Cardiac Enzymes: No results for  input(s): "CKTOTAL", "CKMB", "CKMBINDEX", "TROPONINI" in the last 168 hours. BNP (last 3 results) No results for input(s): "PROBNP" in the last 8760 hours. HbA1C: No results for input(s): "HGBA1C" in the last 72 hours. CBG: No results for input(s): "GLUCAP" in the last 168 hours. Lipid Profile: No results for input(s): "CHOL", "HDL", "LDLCALC", "TRIG", "CHOLHDL", "LDLDIRECT" in the last 72 hours. Thyroid Function Tests: No results for input(s): "TSH", "T4TOTAL", "FREET4", "T3FREE", "THYROIDAB" in the last 72 hours. Anemia Panel: No results for input(s): "VITAMINB12", "FOLATE", "FERRITIN", "TIBC", "IRON", "RETICCTPCT" in the last 72 hours. Urine analysis:    Component Value Date/Time   COLORURINE STRAW (A) 08/17/2022 1847   APPEARANCEUR CLEAR 08/17/2022 1847   LABSPEC 1.013 08/17/2022 1847   PHURINE 6.0 08/17/2022 1847   GLUCOSEU NEGATIVE 08/17/2022 1847   HGBUR SMALL (A) 08/17/2022 1847   BILIRUBINUR NEGATIVE 08/17/2022 1847   KETONESUR NEGATIVE 08/17/2022 1847   PROTEINUR NEGATIVE 08/17/2022 1847   NITRITE NEGATIVE 08/17/2022 1847   LEUKOCYTESUR NEGATIVE 08/17/2022 1847   Sepsis Labs: _0 (procalcitonin:4,lacticidven:4)  ) Recent Results (from the past 240 hour(s))  SARS CORONAVIRUS 2 (TAT 6-24 HRS) Anterior Nasal Swab     Status: None   Collection Time: 08/16/22  2:44 PM   Specimen: Anterior Nasal Swab  Result Value Ref Range Status   SARS Coronavirus 2 NEGATIVE NEGATIVE Final    Comment: (NOTE) SARS-CoV-2 target nucleic acids are NOT DETECTED.  The SARS-CoV-2 RNA is generally detectable in upper and lower respiratory specimens during the acute phase of infection. Negative results do not preclude SARS-CoV-2 infection, do not rule out co-infections with other pathogens, and should not be used as the sole basis for treatment or other patient management decisions. Negative results must be combined with clinical observations, patient history, and epidemiological  information. The expected result is Negative.  Fact Sheet for Patients: SugarRoll.be  Fact Sheet for Healthcare Providers: https://www.woods-mathews.com/  This test is not yet approved or cleared by the [place] FDA and  has been authorized for detection and/or diagnosis of SARS-CoV-2 by FDA under an Emergency Use Authorization (EUA). This EUA will remain  in effect (meaning this test can be used) for the duration of the COVID-19 declaration under Se ction 564(b)(1) of the Act, 21 U.S.C. section 360bbb-3(b)(1), unless the authorization is terminated or revoked sooner.  Performed at [facility], [place] [address], [place], [place] [address]   Respiratory (~20 pathogens) panel by PCR     Status: Abnormal   Collection Time: 08/18/22  4:27 AM   Specimen: Nasopharyngeal Swab; Respiratory  Result Value Ref Range Status   Adenovirus NOT DETECTED NOT DETECTED Final   Coronavirus 229E NOT DETECTED NOT DETECTED Final    Comment: (NOTE) The Coronavirus on the Respiratory Panel, DOES NOT test for the novel  Coronavirus (2019 nCoV)    Coronavirus HKU1 NOT DETECTED NOT DETECTED Final   Coronavirus NL63 NOT DETECTED NOT DETECTED Final   Coronavirus OC43 NOT DETECTED NOT DETECTED Final   Metapneumovirus NOT DETECTED NOT DETECTED Final   Rhinovirus / Enterovirus NOT DETECTED NOT DETECTED Final   Influenza A NOT DETECTED NOT DETECTED Final   Influenza B NOT DETECTED NOT DETECTED Final   Parainfluenza Virus 1 NOT DETECTED NOT DETECTED Final   Parainfluenza Virus 2 NOT DETECTED NOT DETECTED Final   Parainfluenza Virus 3 NOT DETECTED NOT DETECTED Final   Parainfluenza Virus 4 NOT DETECTED NOT DETECTED Final   Respiratory Syncytial Virus DETECTED (A) NOT DETECTED Final   Bordetella pertussis NOT DETECTED NOT DETECTED Final   Bordetella Parapertussis NOT DETECTED NOT DETECTED Final   Chlamydophila pneumoniae NOT DETECTED NOT DETECTED Final    Mycoplasma pneumoniae NOT DETECTED NOT DETECTED Final    Comment: Performed at [facility], [place] [address], [place], [place] [address]         Radiology Studies: CT Angio Chest Pulmonary Embolism (PE) W or WO Contrast  Result Date: 08/17/2022 CLINICAL DATA:  Pulmonary embolism suspected, high probability. EXAM: CT ANGIOGRAPHY CHEST WITH CONTRAST TECHNIQUE: Multidetector CT imaging of the chest was performed using the standard protocol during bolus administration of intravenous contrast. Multiplanar CT image reconstructions and MIPs were obtained to evaluate the vascular anatomy. RADIATION DOSE REDUCTION: This exam was performed according to the departmental dose-optimization program which includes automated exposure control, adjustment of the mA and/or kV according to patient size and/or use of iterative reconstruction technique. CONTRAST:  60m OMNIPAQUE IOHEXOL 350 MG/ML SOLN COMPARISON:  03/25/2021. FINDINGS: Cardiovascular: The heart is enlarged and there is a trace pericardial effusion. Scattered coronary artery calcifications are noted. There is atherosclerotic calcification of the aorta without evidence of aneurysm. Pulmonary trunk is normal in caliber. No pulmonary artery filling defect is identified. Mediastinum/Nodes: No mediastinal, hilar, or axillary lymphadenopathy. A hypodensity is present in the right lobe of the thyroid gland measuring 7 mm. No follow-up imaging is recommended. The trachea and esophagus are within normal limits. There is a large hiatal hernia. Lungs/Pleura: There is apical pleural thickening bilaterally, greater on the left than on the right with a few calcifications. Scattered airspace disease is noted in the left upper and lower lobes. There is a trace left pleural effusion. No pneumothorax. Upper Abdomen: No acute abnormality. Musculoskeletal: There is kyphosis of the midthoracic spine with multiple compression deformities, unchanged from 03/25/2021. Multilevel  degenerative changes and degenerative disc disease are noted. Review of the MIP images confirms the above findings. IMPRESSION: 1. No evidence of pulmonary embolism. 2. Scattered airspace disease in the left upper and lower lobes, concerning for infiltrate. Short-term follow-up is recommended to exclude the possibility of underlying nodules. 3. Bilateral apical pleural scarring, greater on the left than on the right. Attention on follow-up is recommended. 4. Small left pleural effusion. 5. Multilevel degenerative changes and compression deformities in the thoracic spine with kyphosis. 6. The heart hiatal hernia. 7. Aortic atherosclerosis and scattered coronary artery calcifications. Electronically Signed   By: LBrett FairyM.D.   On: 08/17/2022 22:14   DG Chest 2 View  Result Date: 08/16/2022 CLINICAL DATA:  Cough. EXAM: CHEST - 2 VIEW COMPARISON:  03/25/2021 FINDINGS: Lungs are hyperexpanded with increased AP diameter of the chest and focal kyphosis of the midthoracic spine secondary to compression fractures. The lungs are clear without focal pneumonia, edema, pneumothorax or pleural effusion.  Cardiopericardial silhouette is at upper limits of normal for size. Small to moderate hiatal hernia noted. Bones are diffusely demineralized. IMPRESSION: 1. Hyperexpansion without acute cardiopulmonary findings. 2. Small to moderate hiatal hernia. Electronically Signed   By: Misty Stanley M.D.   On: 08/16/2022 14:56        Scheduled Meds:  apixaban  2.5 mg Oral BID   cholecalciferol  400 Units Oral Daily   metoprolol succinate  25 mg Oral Daily   pneumococcal 13-valent conjugate vaccine  0.5 mL Intramuscular Once   senna-docusate  1 tablet Oral Daily   Continuous Infusions:  azithromycin Stopped (08/18/22 0332)   cefTRIAXone (ROCEPHIN)  IV       LOS: 1 day     Desma Maxim, MD [facility]   If 7PM-7AM, please contact night-coverage [email or web address] Password [email or web address] 08/18/2022, 2:17 PM

## 2022-08-19 DIAGNOSIS — J121 Respiratory syncytial virus pneumonia: Secondary | ICD-10-CM | POA: Diagnosis not present

## 2022-08-19 LAB — BASIC METABOLIC PANEL
Anion gap: 9 (ref 5–15)
BUN: 24 mg/dL — ABNORMAL HIGH (ref 8–23)
CO2: 26 mmol/L (ref 22–32)
Calcium: 8.7 mg/dL — ABNORMAL LOW (ref 8.9–10.3)
Chloride: 99 mmol/L (ref 98–111)
Creatinine, Ser: 0.79 mg/dL (ref 0.44–1.00)
GFR, Estimated: >60 mL/min (ref >60)
Glucose, Bld: 108 mg/dL — ABNORMAL HIGH (ref 70–99)
Potassium: 3.4 mmol/L — ABNORMAL LOW (ref 3.5–5.1)
Sodium: 134 mmol/L — ABNORMAL LOW (ref 135–145)

## 2022-08-19 MED ORDER — MEMANTINE HCL 10 MG PO TABS
10.0000 mg | ORAL_TABLET | Freq: Every day | ORAL | Status: DC
  Administered 2022-08-19 – 2022-08-20 (×2): 10 mg via ORAL
  Filled 2022-08-19 (×2): qty 1

## 2022-08-19 MED ORDER — GUAIFENESIN-DM 100-10 MG/5ML PO SYRP
5.0000 mL | ORAL_SOLUTION | ORAL | Status: DC | PRN
  Administered 2022-08-19 (×2): 5 mL via ORAL
  Filled 2022-08-19 (×2): qty 5

## 2022-08-19 MED ORDER — MENTHOL 3 MG MT LOZG
1.0000 | LOZENGE | OROMUCOSAL | Status: DC | PRN
  Administered 2022-08-19: 3 mg via ORAL
  Filled 2022-08-19: qty 9

## 2022-08-19 MED ORDER — POTASSIUM CHLORIDE CRYS ER 20 MEQ PO TBCR
40.0000 meq | EXTENDED_RELEASE_TABLET | Freq: Once | ORAL | Status: AC
  Administered 2022-08-19: 40 meq via ORAL
  Filled 2022-08-19: qty 2

## 2022-08-19 MED ORDER — HYDROCHLOROTHIAZIDE 12.5 MG PO TABS
12.5000 mg | ORAL_TABLET | Freq: Every day | ORAL | Status: DC
  Administered 2022-08-19 – 2022-08-20 (×2): 12.5 mg via ORAL
  Filled 2022-08-19 (×2): qty 1

## 2022-08-19 NOTE — Plan of Care (Signed)
  Problem: Education: Goal: Knowledge of General Education information will improve Description: Including pain rating scale, medication(s)/side effects and non-pharmacologic comfort measures Outcome: Progressing   Problem: Clinical Measurements: Goal: Respiratory complications will improve Outcome: Progressing   Problem: Activity: Goal: Risk for activity intolerance will decrease Outcome: Progressing   Problem: Nutrition: Goal: Adequate nutrition will be maintained Outcome: Progressing   Problem: Coping: Goal: Level of anxiety will decrease Outcome: Progressing   Problem: Elimination: Goal: Will not experience complications related to bowel motility Outcome: Progressing Goal: Will not experience complications related to urinary retention Outcome: Progressing   Problem: Pain Managment: Goal: General experience of comfort will improve Outcome: Progressing   Problem: Safety: Goal: Ability to remain free from injury will improve Outcome: Progressing   Problem: Skin Integrity: Goal: Risk for impaired skin integrity will decrease Outcome: Progressing

## 2022-08-19 NOTE — Progress Notes (Signed)
PROGRESS NOTE    Gina Mcgee  RWE:[id number] DOB: October 29, 1929 DOA: 08/17/2022 PCP: Vernie Shanks, MD (Inactive)     Brief Narrative:  Gina Mcgee is a 88 y.o. female with medical history significant of Afib on Eliquis, Left BRCA, left brachial plexus neuropathy, essential hypertension, osteoporosis, bladder ca s/p TURBT 3/22 who presents to ED with 3 days of cough/congestion and episode of confusion. Of note patient was seen in Urgent Care on 11/25 with negative respiratory panel and was sent home with supportive care for URI.  Patient's symptoms progressed however and due to this presents to ED. Patient currently denies any chest pain, n/v/d/ diarrhea/ dysuria, notes persistent productive cough. Admits that her home aide was sick last week and her daughter may also have had some symptoms (works as Therapist, sports with trach/vent patients).   New events last 24 hours / Subjective: Patient states that her breathing has improved.  Continues to require oxygen.  Continues to have cough.  Assessment & Plan:   Principal Problem:   RSV (respiratory syncytial virus pneumonia) Active Problems:   HTN (hypertension)   CAP (community acquired pneumonia)   Bladder cancer ([place])   MCI (mild cognitive impairment)   Hypoxic respiratory failure (HCC)    RSV with acute hypoxemic respiratory failure -Supportive care, wean oxygen as able to tolerate to maintain saturation >92%  Hypertension -HCTZ, metoprolol  Hypokalemia -Replace  A-fib -Metoprolol, Eliquis  Mild cognitive impairment/delirium -Improved.  Memantine    DVT prophylaxis:  apixaban (ELIQUIS) tablet 2.5 mg Start: 08/18/22 1000 apixaban (ELIQUIS) tablet 2.5 mg  Code Status: Full code Family Communication: No family at bedside, does have her home aide at bedside Disposition Plan:  Status is: Inpatient Remains inpatient appropriate because: Continue to wean oxygen.  PT ordered.  If improve, likely discharge home  11/29   Antimicrobials:  Anti-infectives (From admission, onward)    Start     Dose/Rate Route Frequency Ordered Stop   08/18/22 2200  cefTRIAXone (ROCEPHIN) 2 g in sodium chloride 0.9 % 100 mL IVPB  Status:  Discontinued        2 g 200 mL/hr over 30 Minutes Intravenous Every 24 hours 08/18/22 0057 08/18/22 1424   08/18/22 0100  azithromycin (ZITHROMAX) 500 mg in sodium chloride 0.9 % 250 mL IVPB  Status:  Discontinued        500 mg 250 mL/hr over 60 Minutes Intravenous Every 24 hours 08/18/22 0057 08/18/22 1424   08/17/22 2300  cefTRIAXone (ROCEPHIN) 1 g in sodium chloride 0.9 % 100 mL IVPB        1 g 200 mL/hr over 30 Minutes Intravenous  Once 08/17/22 2245 08/17/22 2325        Objective: Vitals:   08/19/22 0400 08/19/22 0500 08/19/22 0654 08/19/22 0813  BP: (!) 152/106 (!) 173/141 (!) 125/97 (!) 143/85  Pulse: 81  83 81  Resp: (!) 22 (!) _0 Temp:   98.3 F (36.8 C) (!) 97.5 F (36.4 C)  TempSrc:   Oral Oral  SpO2: 90%  97% 100%   No intake or output data in the 24 hours ending 08/19/22 1154 There were no vitals filed for this visit.  Examination:  General exam: Appears calm and comfortable, severe kyphosis Respiratory system: Cle diminished breath sounds bilaterally without wheeze, on 2 L oxygen, without conversational dyspnea Cardiovascular system: S1 & S2 heard, RRR. No murmurs. No pedal edema. Gastrointestinal system: Abdomen is nondistended, soft and nontender. Normal bowel sounds heard. Central  nervous system: Alert and oriented. No focal neurological deficits. Speech clear.  Extremities: Symmetric in appearance  Skin: No rashes, lesions or ulcers on exposed skin  Psychiatry: Judgement and insight appear normal. Mood & affect appropriate.   Data Reviewed: I have personally reviewed following labs and imaging studies  CBC: Recent Labs  Lab 08/17/22 1817 08/17/22 1923 08/18/22 0458  WBC 14.3*  --  14.8*  NEUTROABS  --   --  13.0*  HGB 12.3 12.6  11.4*  HCT 37.1 37.0 35.7*  MCV 86.7  --  89.0  PLT 189  --  709   Basic Metabolic Panel: Recent Labs  Lab 08/17/22 1817 08/17/22 1923 08/18/22 0458 08/18/22 0500 08/19/22 0547  NA 134* 134* 133*  --  134*  K 2.9* 3.1* 3.3*  --  3.4*  CL 96*  --  97*  --  99  CO2 25  --  25  --  26  GLUCOSE 129*  --  112*  --  108*  BUN 20  --  18  --  24*  CREATININE 0.91  --  0.90  --  0.79  CALCIUM 9.1  --  8.7*  --  8.7*  MG  --   --   --  2.1  --    GFR: CrCl cannot be calculated (Unknown ideal weight.). Liver Function Tests: Recent Labs  Lab 08/17/22 1817 08/18/22 0458  AST 33 30  ALT 16 16  ALKPHOS 85 74  BILITOT 0.6 0.7  PROT 7.1 6.4*  ALBUMIN 3.7 3.3*   No results for input(s): "LIPASE", "AMYLASE" in the last 168 hours. No results for input(s): "AMMONIA" in the last 168 hours. Coagulation Profile: No results for input(s): "INR", "PROTIME" in the last 168 hours. Cardiac Enzymes: No results for input(s): "CKTOTAL", "CKMB", "CKMBINDEX", "TROPONINI" in the last 168 hours. BNP (last 3 results) No results for input(s): "PROBNP" in the last 8760 hours. HbA1C: No results for input(s): "HGBA1C" in the last 72 hours. CBG: No results for input(s): "GLUCAP" in the last 168 hours. Lipid Profile: No results for input(s): "CHOL", "HDL", "LDLCALC", "TRIG", "CHOLHDL", "LDLDIRECT" in the last 72 hours. Thyroid Function Tests: No results for input(s): "TSH", "T4TOTAL", "FREET4", "T3FREE", "THYROIDAB" in the last 72 hours. Anemia Panel: No results for input(s): "VITAMINB12", "FOLATE", "FERRITIN", "TIBC", "IRON", "RETICCTPCT" in the last 72 hours. Sepsis Labs: No results for input(s): "PROCALCITON", "LATICACIDVEN" in the last 168 hours.  Recent Results (from the past 240 hour(s))  SARS CORONAVIRUS 2 (TAT 6-24 HRS) Anterior Nasal Swab     Status: None   Collection Time: 08/16/22  2:44 PM   Specimen: Anterior Nasal Swab  Result Value Ref Range Status   SARS Coronavirus 2 NEGATIVE  NEGATIVE Final    Comment: (NOTE) SARS-CoV-2 target nucleic acids are NOT DETECTED.  The SARS-CoV-2 RNA is generally detectable in upper and lower respiratory specimens during the acute phase of infection. Negative results do not preclude SARS-CoV-2 infection, do not rule out co-infections with other pathogens, and should not be used as the sole basis for treatment or other patient management decisions. Negative results must be combined with clinical observations, patient history, and epidemiological information. The expected result is Negative.  Fact Sheet for Patients: SugarRoll.be  Fact Sheet for Healthcare Providers: https://www.woods-mathews.com/  This test is not yet approved or cleared by the [place] FDA and  has been authorized for detection and/or diagnosis of SARS-CoV-2 by FDA under an Emergency Use Authorization (EUA). This EUA will remain  in effect (meaning this test can be used) for the duration of the COVID-19 declaration under Se ction 564(b)(1) of the Act, 21 U.S.C. section 360bbb-3(b)(1), unless the authorization is terminated or revoked sooner.  Performed at [facility], [place] [address] [place], [place] [address]   Respiratory (~20 pathogens) panel by PCR     Status: Abnormal   Collection Time: 08/18/22  4:27 AM   Specimen: Nasopharyngeal Swab; Respiratory  Result Value Ref Range Status   Adenovirus NOT DETECTED NOT DETECTED Final   Coronavirus 229E NOT DETECTED NOT DETECTED Final    Comment: (NOTE) The Coronavirus on the Respiratory Panel, DOES NOT test for the novel  Coronavirus (2019 nCoV)    Coronavirus HKU1 NOT DETECTED NOT DETECTED Final   Coronavirus NL63 NOT DETECTED NOT DETECTED Final   Coronavirus OC43 NOT DETECTED NOT DETECTED Final   Metapneumovirus NOT DETECTED NOT DETECTED Final   Rhinovirus / Enterovirus NOT DETECTED NOT DETECTED Final   Influenza A NOT DETECTED NOT DETECTED Final    Influenza B NOT DETECTED NOT DETECTED Final   Parainfluenza Virus 1 NOT DETECTED NOT DETECTED Final   Parainfluenza Virus 2 NOT DETECTED NOT DETECTED Final   Parainfluenza Virus 3 NOT DETECTED NOT DETECTED Final   Parainfluenza Virus 4 NOT DETECTED NOT DETECTED Final   Respiratory Syncytial Virus DETECTED (A) NOT DETECTED Final   Bordetella pertussis NOT DETECTED NOT DETECTED Final   Bordetella Parapertussis NOT DETECTED NOT DETECTED Final   Chlamydophila pneumoniae NOT DETECTED NOT DETECTED Final   Mycoplasma pneumoniae NOT DETECTED NOT DETECTED Final    Comment: Performed at [facility], [address], [place], [place] [address]  SARS Coronavirus 2 by RT PCR (hospital order, performed in [facility] hospital lab) *cepheid single result test* Anterior Nasal Swab     Status: None   Collection Time: 08/18/22  2:36 PM   Specimen: Anterior Nasal Swab  Result Value Ref Range Status   SARS Coronavirus 2 by RT PCR NEGATIVE NEGATIVE Final    Comment: (NOTE) SARS-CoV-2 target nucleic acids are NOT DETECTED.  The SARS-CoV-2 RNA is generally detectable in upper and lower respiratory specimens during the acute phase of infection. The lowest concentration of SARS-CoV-2 viral copies this assay can detect is 250 copies / mL. A negative result does not preclude SARS-CoV-2 infection and should not be used as the sole basis for treatment or other patient management decisions.  A negative result may occur with improper specimen collection / handling, submission of specimen other than nasopharyngeal swab, presence of viral mutation(s) within the areas targeted by this assay, and inadequate number of viral copies (<250 copies / mL). A negative result must be combined with clinical observations, patient history, and epidemiological information.  Fact Sheet for Patients:   https://www.patel.info/  Fact Sheet for Healthcare  Providers: https://hall.com/  This test is not yet approved or  cleared by the [place] FDA and has been authorized for detection and/or diagnosis of SARS-CoV-2 by FDA under an Emergency Use Authorization (EUA).  This EUA will remain in effect (meaning this test can be used) for the duration of the COVID-19 declaration under Section 564(b)(1) of the Act, 21 U.S.C. section 360bbb-3(b)(1), unless the authorization is terminated or revoked sooner.  Performed at [facility], [place] [address] [place], [place] [address]       Radiology Studies: CT Angio Chest Pulmonary Embolism (PE) W or WO Contrast  Result Date: 08/17/2022 CLINICAL DATA:  Pulmonary embolism  suspected, high probability. EXAM: CT ANGIOGRAPHY CHEST WITH CONTRAST TECHNIQUE: Multidetector CT imaging of the chest was performed using the standard protocol during bolus administration of intravenous contrast. Multiplanar CT image reconstructions and MIPs were obtained to evaluate the vascular anatomy. RADIATION DOSE REDUCTION: This exam was performed according to the departmental dose-optimization program which includes automated exposure control, adjustment of the mA and/or kV according to patient size and/or use of iterative reconstruction technique. CONTRAST:  52m OMNIPAQUE IOHEXOL 350 MG/ML SOLN COMPARISON:  03/25/2021. FINDINGS: Cardiovascular: The heart is enlarged and there is a trace pericardial effusion. Scattered coronary artery calcifications are noted. There is atherosclerotic calcification of the aorta without evidence of aneurysm. Pulmonary trunk is normal in caliber. No pulmonary artery filling defect is identified. Mediastinum/Nodes: No mediastinal, hilar, or axillary lymphadenopathy. A hypodensity is present in the right lobe of the thyroid gland measuring 7 mm. No follow-up imaging is recommended. The trachea and esophagus are within normal limits. There is a large hiatal hernia.  Lungs/Pleura: There is apical pleural thickening bilaterally, greater on the left than on the right with a few calcifications. Scattered airspace disease is noted in the left upper and lower lobes. There is a trace left pleural effusion. No pneumothorax. Upper Abdomen: No acute abnormality. Musculoskeletal: There is kyphosis of the midthoracic spine with multiple compression deformities, unchanged from 03/25/2021. Multilevel degenerative changes and degenerative disc disease are noted. Review of the MIP images confirms the above findings. IMPRESSION: 1. No evidence of pulmonary embolism. 2. Scattered airspace disease in the left upper and lower lobes, concerning for infiltrate. Short-term follow-up is recommended to exclude the possibility of underlying nodules. 3. Bilateral apical pleural scarring, greater on the left than on the right. Attention on follow-up is recommended. 4. Small left pleural effusion. 5. Multilevel degenerative changes and compression deformities in the thoracic spine with kyphosis. 6. The heart hiatal hernia. 7. Aortic atherosclerosis and scattered coronary artery calcifications. Electronically Signed   By: LBrett FairyM.D.   On: 08/17/2022 22:14      Scheduled Meds:  apixaban  2.5 mg Oral BID   cholecalciferol  400 Units Oral Daily   hydrochlorothiazide  12.5 mg Oral Daily   memantine  10 mg Oral Daily   metoprolol succinate  25 mg Oral Daily   senna-docusate  1 tablet Oral Daily   Continuous Infusions:   LOS: 2 days     JDessa Phi DO [facility] 08/19/2022, 11:54 AM   Available via Epic secure chat 7am-7pm After these hours, please refer to coverage provider listed on [email or web address]

## 2022-08-19 NOTE — ED Notes (Signed)
Morning labs collected and sent to lab

## 2022-08-19 NOTE — Plan of Care (Signed)
  Problem: Education: Goal: Knowledge of General Education information will improve Description Including pain rating scale, medication(s)/side effects and non-pharmacologic comfort measures Outcome: Progressing   Problem: Clinical Measurements: Goal: Ability to maintain clinical measurements within normal limits will improve Outcome: Progressing   Problem: Activity: Goal: Risk for activity intolerance will decrease Outcome: Progressing   

## 2022-08-20 ENCOUNTER — Other Ambulatory Visit (HOSPITAL_COMMUNITY): Payer: Self-pay

## 2022-08-20 DIAGNOSIS — J121 Respiratory syncytial virus pneumonia: Secondary | ICD-10-CM | POA: Diagnosis not present

## 2022-08-20 LAB — BASIC METABOLIC PANEL
Anion gap: 9 (ref 5–15)
BUN: 13 mg/dL (ref 8–23)
CO2: 25 mmol/L (ref 22–32)
Calcium: 8.9 mg/dL (ref 8.9–10.3)
Chloride: 96 mmol/L — ABNORMAL LOW (ref 98–111)
Creatinine, Ser: 0.83 mg/dL (ref 0.44–1.00)
GFR, Estimated: >60 mL/min (ref >60)
Glucose, Bld: 109 mg/dL — ABNORMAL HIGH (ref 70–99)
Potassium: 4 mmol/L (ref 3.5–5.1)
Sodium: 130 mmol/L — ABNORMAL LOW (ref 135–145)

## 2022-08-20 LAB — CBC
HCT: 36.3 % (ref 36.0–46.0)
Hemoglobin: 11.5 g/dL — ABNORMAL LOW (ref 12.0–15.0)
MCH: 27.9 pg (ref 26.0–34.0)
MCHC: 31.7 g/dL (ref 30.0–36.0)
MCV: 88.1 fL (ref 80.0–100.0)
Platelets: 215 10*3/uL (ref 150–400)
RBC: 4.12 MIL/uL (ref 3.87–5.11)
RDW: 13.2 % (ref 11.5–15.5)
WBC: 8.9 10*3/uL (ref 4.0–10.5)
nRBC: 0 % (ref 0.0–0.2)

## 2022-08-20 LAB — MAGNESIUM: Magnesium: 2.2 mg/dL (ref 1.7–2.4)

## 2022-08-20 LAB — LEGIONELLA PNEUMOPHILA SEROGP 1 UR AG: L. pneumophila Serogp 1 Ur Ag: NEGATIVE

## 2022-08-20 MED ORDER — IPRATROPIUM-ALBUTEROL 0.5-2.5 (3) MG/3ML IN SOLN
3.0000 mL | Freq: Four times a day (QID) | RESPIRATORY_TRACT | Status: DC
  Administered 2022-08-20 (×2): 3 mL via RESPIRATORY_TRACT
  Filled 2022-08-20 (×2): qty 3

## 2022-08-20 MED ORDER — ALBUTEROL SULFATE (2.5 MG/3ML) 0.083% IN NEBU
2.5000 mg | INHALATION_SOLUTION | RESPIRATORY_TRACT | 2 refills | Status: DC | PRN
  Filled 2022-08-20 – 2022-08-21 (×2): qty 90, 5d supply, fill #0

## 2022-08-20 MED ORDER — GUAIFENESIN-DM 100-10 MG/5ML PO SYRP
5.0000 mL | ORAL_SOLUTION | ORAL | 0 refills | Status: DC | PRN
  Filled 2022-08-20 – 2022-08-21 (×2): qty 118, 4d supply, fill #0

## 2022-08-20 NOTE — Evaluation (Addendum)
Physical Therapy Evaluation Patient Details Name: Gina Mcgee MRN: [id number] DOB: 12-25-1929 Today's Date: 08/20/2022  History of Present Illness  Pt is an 88 y/o F admitted on 08/17/22 after presenting with c/o cough, congestion, & episode of confusion. Pt is being treated for RSV with acute hypoxemic respiratory failure. PMH: a-fib on eliquis, L BRCA, L brachial plexus neuropathy, HTN, osteoporosis, bladder CA s/p TURBT 3/22   Clinical Impression  Pt seen for PT evaluation with pt's caregiver present for session. Prior to admission pt was ambulatory for very short distances with rollator with PRN assistance. Pt has 24 hr caregivers and chronic LUE injury resulting in minimal use of L wrist/hand. On this date, pt is able to complete bed mobility with supervision with HOB elevated, STS with min<>mod assist and step pivot with RW or HHA with min<>mod assist. Pt is able to ambulate short distance with RW & min assist increasing to mod assist. Pt requires cuing to focus on task at hand & demonstrates better balance when moving slow. Pt does not appear to be far from her baseline level of mobility. Anticipate pt can d/c home with HHPT & continuing 24 hr caregivers.  Addendum: When assisting pt with toileting, pt noted to have small red bumps on her back that caregiver reports are new. MD made aware.   Recommendations for follow up therapy are one component of a multi-disciplinary discharge planning process, led by the attending physician.  Recommendations may be updated based on patient status, additional functional criteria and insurance authorization.  Follow Up Recommendations Home health PT      Assistance Recommended at Discharge Frequent or constant Supervision/Assistance  Patient can return home with the following  A little help with walking and/or transfers;A little help with bathing/dressing/bathroom;Assistance with cooking/housework;Assist for transportation    Equipment  Recommendations None recommended by PT  Recommendations for Other Services       Functional Status Assessment Patient has had a recent decline in their functional status and demonstrates the ability to make significant improvements in function in a reasonable and predictable amount of time.     Precautions / Restrictions Precautions Precautions: Fall Restrictions Weight Bearing Restrictions: No      Mobility  Bed Mobility Overal bed mobility: Needs Assistance Bed Mobility: Supine to Sit     Supine to sit: Supervision, HOB elevated     General bed mobility comments: extar time to fully scoot to sitting EOB    Transfers Overall transfer level: Needs assistance Equipment used: Rolling walker (2 wheels), 1 person hand held assist Transfers: Sit to/from Stand, Bed to chair/wheelchair/BSC Sit to Stand: Min assist   Step pivot transfers: Min assist, Mod assist            Ambulation/Gait Ambulation/Gait assistance: Min assist, Mod assist Gait Distance (Feet): 5 Feet Assistive device: Rolling walker (2 wheels) Gait Pattern/deviations: Decreased step length - right, Decreased step length - left, Decreased dorsiflexion - right, Decreased dorsiflexion - left, Decreased stride length Gait velocity: decreased     General Gait Details: Pt with difficulty managing RW as pt unable to hold with L hand, instead leans on it with L forearm and pushes it this way. Pt initially requires min assist for gait with RW, but increases to mod assist 2/2 impaired balance/LOB.  Stairs            Wheelchair Mobility    Modified Rankin (Stroke Patients Only)       Balance Overall balance assessment: Needs assistance Sitting-balance  support: Feet supported, Bilateral upper extremity supported Sitting balance-Leahy Scale: Fair Sitting balance - Comments: supervision static sitting   Standing balance support: Bilateral upper extremity supported, During functional activity, Reliant on  assistive device for balance Standing balance-Leahy Scale: Poor                               Pertinent Vitals/Pain Pain Assessment Pain Assessment: No/denies pain    Home Living Family/patient expects to be discharged to:: Private residence Living Arrangements: Children Available Help at Discharge: Family;Personal care attendant;Available 24 hours/day Type of Home: House Home Access: Level entry       Home Layout: One level Home Equipment: Rollator (4 wheels) Additional Comments: Has 24 hr assistance between [employer], personal care aide, & daughter.    Prior Function               Mobility Comments: Pt was able to ambulate short distances ("across the room") prior to admission with rollator.       Hand Dominance   Dominant Hand: Right    Extremity/Trunk Assessment   Upper Extremity Assessment Upper Extremity Assessment: LUE deficits/detail LUE Deficits / Details: Pt reports chronic inability to use L wrist, uses rollator by leaning on it with L forearm. L wrist in extended position at rest.    Lower Extremity Assessment Lower Extremity Assessment: Generalized weakness    Cervical / Trunk Assessment Cervical / Trunk Assessment: Kyphotic  Communication   Communication: HOH  Cognition Arousal/Alertness: Awake/alert Behavior During Therapy: WFL for tasks assessed/performed Overall Cognitive Status: Within Functional Limits for tasks assessed                                          General Comments General comments (skin integrity, edema, etc.): Pt on room air, SpO2 92% or greater. Max HR 122 bpm. Pt with continent void on BSC.    Exercises     Assessment/Plan    PT Assessment Patient needs continued PT services  PT Problem List Decreased strength;Cardiopulmonary status limiting activity;Decreased activity tolerance;Decreased balance;Decreased mobility       PT Treatment Interventions DME instruction;Therapeutic  exercise;Balance training;Gait training;Neuromuscular re-education;Functional mobility training;Therapeutic activities;Patient/family education    PT Goals (Current goals can be found in the Care Plan section)  Acute Rehab PT Goals Patient Stated Goal: get better PT Goal Formulation: With patient Time For Goal Achievement: 09/03/22 Potential to Achieve Goals: Good    Frequency Min 3X/week     Co-evaluation               AM-PAC PT "6 Clicks" Mobility  Outcome Measure Help needed turning from your back to your side while in a flat bed without using bedrails?: None Help needed moving from lying on your back to sitting on the side of a flat bed without using bedrails?: A Little Help needed moving to and from a bed to a chair (including a wheelchair)?: A Little Help needed standing up from a chair using your arms (e.g., wheelchair or bedside chair)?: A Little Help needed to walk in hospital room?: Total Help needed climbing 3-5 steps with a railing? : Total 6 Click Score: 15    End of Session   Activity Tolerance: Patient tolerated treatment well Patient left: in chair;with chair alarm set;with call bell/phone within reach;with family/visitor present Nurse Communication: Mobility status  PT Visit Diagnosis: Unsteadiness on feet (R26.81);Muscle weakness (generalized) (M62.81)    Time: 7628-3151 PT Time Calculation (min) (ACUTE ONLY): 20 min   Charges:   PT Evaluation $PT Eval Moderate Complexity: [place], PT, DPT 08/20/22, 10:32 AM   Waunita Schooner 08/20/2022, 9:47 AM

## 2022-08-20 NOTE — TOC Initial Note (Addendum)
Transition of Care [facility]) - Initial/Assessment Note    Patient Details  Name: Gina Mcgee MRN: [id number] Date of Birth: 1930-08-27  Transition of [facility]) CM/SW Contact:    Carles Collet, RN Phone Number: 08/20/2022, 10:44 AM  Clinical Narrative:                 Damaris Schooner w patient and caregiver Blanch Media at bedside and daughter Belenda Cruise on speakerphone.  Belenda Cruise confirms that [facility] is able to provide assistance. She states she spoke w another person there to clarify they can come out to the home with patient being +RSV Belenda Cruise states she also set up additional help for the next 5 nights.  Patient has RW and WC at home, no additional ambulatory DME needs identified. Confirmed w PT that they worked w patient this morning on RA for the whole session and she never dropped to 88%, thus patient does not qualify for home oxygen, notified attending.  Caregiver mentioned patient may need nebulizer for home. Secure messaged Dr Eliseo Squires to clarify and I will order one and have it delivered to the room if she needs it for DC. *Nebulizer order placed and [employer] will deliver it to the room today prior to DC Belenda Cruise has no preference for [place], [facility] accepted referral.   Expected Discharge Plan: [place] Barriers to Discharge: Continued Medical Work up   Patient Goals and CMS Choice Patient states their goals for this hospitalization and ongoing recovery are:: to go home CMS Medicare.gov Compare Post Acute Care list provided to:: Other (Comment Required) Choice offered to / list presented to : Adult Children  Expected Discharge Plan and Services Expected Discharge Plan: [place]   Discharge Planning Services: CM Consult Post Acute Care Choice: Home Health, Durable Medical Equipment Living arrangements for the past 2 months: [place]: PT HH Agency: [place] Date [place]: 08/20/22 Time [place]: 4765 Representative spoke with at [place]: [place] Arrangements/Services Living arrangements for the past 2 months: [place] with:: Adult Children   Do you feel safe going back to the place where you live?: Yes          Current home services: DME, Homehealth aide    Activities of Daily Living Home Assistive Devices/Equipment: Environmental consultant (specify type) ADL Screening (condition at time of admission) Patient's cognitive ability adequate to safely complete daily activities?: Yes Is the patient deaf or have difficulty hearing?: No Does the patient have difficulty seeing, even when wearing glasses/contacts?: No Does the patient have difficulty concentrating, remembering, or making decisions?: No Patient able to express need for assistance with ADLs?: Yes Does the patient have difficulty dressing or bathing?: Yes Independently performs ADLs?: No Communication: Independent Dressing (OT): Needs assistance Is this a change from baseline?: Pre-admission baseline Grooming: Needs assistance Is this a change from baseline?: Pre-admission baseline Feeding: Independent Bathing: Needs assistance Is this a change from baseline?: Pre-admission baseline Toileting: Needs assistance Is this a change from baseline?: Pre-admission baseline In/Out Bed: Needs assistance Is this a change from baseline?: Pre-admission baseline Walks in Home: Needs assistance Is this a change from baseline?: Pre-admission baseline Does the patient have difficulty walking or climbing stairs?: Yes Weakness of Legs: Both Weakness of Arms/Hands: None  Permission Sought/Granted                  Emotional Assessment              Admission diagnosis:  CAP (community acquired pneumonia) [J18.9] Pneumonia due to infectious organism, unspecified laterality, unspecified part of lung [J18.9] Patient Active Problem List   Diagnosis  Date Noted   Bladder cancer ([place]) 08/18/2022   MCI (mild cognitive impairment) 08/18/2022   RSV (respiratory syncytial virus pneumonia) 08/18/2022   Hypoxic respiratory failure ([place]) 08/18/2022   CAP (community acquired pneumonia) 08/17/2022   Atrial fibrillation ([place]) 04/16/2022   Gastroesophageal reflux disease without esophagitis 11/26/2020   Pernicious anemia 11/26/2020   Osteoporosis    Abnormal CXR 01/20/2013   Preop cardiovascular exam 01/20/2013   Surgery, elective 01/18/2013   Fracture of pubic ramus ([place]) 11/21/2011   Hip pain, acute 11/20/2011   Fall 11/20/2011   Hyponatremia 11/20/2011   Hypokalemia 11/20/2011   Pelvic fracture ([place]) 11/20/2011   HTN (hypertension)    Concussion    History of breast cancer in female    PCP:  Vernie Shanks, MD (Inactive) Pharmacy:   [facility] [address], [place] - [address] [place] [address] Phone: [phone] Fax: [place], [place] - [address] AT [address] [address] Phone: [phone] Fax: [phone]     Social Determinants of Health (SDOH) Interventions    Readmission Risk Interventions     No data to display

## 2022-08-20 NOTE — Discharge Summary (Signed)
Physician Discharge Summary  Gina Mcgee:[id number] DOB: 03-May-1930 DOA: 08/17/2022  PCP: Vernie Shanks, MD (Inactive)  Admit date: 08/17/2022 Discharge date: 08/20/2022  Admitted From: home Discharge disposition: home   Recommendations for Outpatient Follow-Up:  Follow imaging of lungs if desires to ensure no nodule hidden by infiltrates Prn nebs BMP 1 week    Discharge Diagnosis:   Principal Problem:   RSV (respiratory syncytial virus pneumonia) Active Problems:   HTN (hypertension)   CAP (community acquired pneumonia)   Bladder cancer ([place])   MCI (mild cognitive impairment)   Hypoxic respiratory failure ([place])    Discharge Condition: Improved.  Diet recommendation:Regular.  Wound care: None.  Code status: Full.   History of Present Illness:   Gina Mcgee is a 88 y.o. female with medical history significant of  Afib on ELiquis, Left BRCA ,  left brachial plexus neuropathy  Essential hypertension, osteoporosis, bladder ca s/p TURBT 3/22 who presents to ED with 3 days of cough /congestion and episode of confusion. Of note patient seen in UC on 11/25 with negative respiratory panel and was sent home with supportive care for URI.  Patient symptoms progressed however and due to this presents to ED. Patient currently denies any chest pain, n/v/d/ diarrhea/ dysuria, notes persistent productive cough.    Hospital Course by Problem:   RSV with acute hypoxemic respiratory failure -Supportive care -flutter valve and nebs -masking to avoid spread   Hypertension -HCTZ, metoprolol   Hypokalemia -Replaced -consider d/c of HCTZ  Hyponatremia -mild -follow up with labs next week   A-fib -Metoprolol, Eliquis   Mild cognitive impairment/delirium -Improved.  Memantine    Medical Consultants:      Discharge Exam:   Vitals:   08/20/22 0930 08/20/22 1057  BP:    Pulse:    Resp:    Temp:    SpO2: 97% 98%   Vitals:   08/19/22 2313  08/20/22 0828 08/20/22 0930 08/20/22 1057  BP: (!) 149/86 (!) 145/77    Pulse: 92 (!) 104    Resp: 16 17    Temp: 98.3 F (36.8 C) 98.8 F (37.1 C)    TempSrc: Oral Oral    SpO2: 92% 90% 97% 98%  Weight:      Height:        General exam: Appears calm and comfortable.    The results of significant diagnostics from this hospitalization (including imaging, microbiology, ancillary and laboratory) are listed below for reference.     Procedures and Diagnostic Studies:   CT Angio Chest Pulmonary Embolism (PE) W or WO Contrast  Result Date: 08/17/2022 CLINICAL DATA:  Pulmonary embolism suspected, high probability. EXAM: CT ANGIOGRAPHY CHEST WITH CONTRAST TECHNIQUE: Multidetector CT imaging of the chest was performed using the standard protocol during bolus administration of intravenous contrast. Multiplanar CT image reconstructions and MIPs were obtained to evaluate the vascular anatomy. RADIATION DOSE REDUCTION: This exam was performed according to the departmental dose-optimization program which includes automated exposure control, adjustment of the mA and/or kV according to patient size and/or use of iterative reconstruction technique. CONTRAST:  49m OMNIPAQUE IOHEXOL 350 MG/ML SOLN COMPARISON:  03/25/2021. FINDINGS: Cardiovascular: The heart is enlarged and there is a trace pericardial effusion. Scattered coronary artery calcifications are noted. There is atherosclerotic calcification of the aorta without evidence of aneurysm. Pulmonary trunk is normal in caliber. No pulmonary artery filling defect is identified. Mediastinum/Nodes: No mediastinal, hilar, or axillary lymphadenopathy. A hypodensity is present in the  right lobe of the thyroid gland measuring 7 mm. No follow-up imaging is recommended. The trachea and esophagus are within normal limits. There is a large hiatal hernia. Lungs/Pleura: There is apical pleural thickening bilaterally, greater on the left than on the right with a few  calcifications. Scattered airspace disease is noted in the left upper and lower lobes. There is a trace left pleural effusion. No pneumothorax. Upper Abdomen: No acute abnormality. Musculoskeletal: There is kyphosis of the midthoracic spine with multiple compression deformities, unchanged from 03/25/2021. Multilevel degenerative changes and degenerative disc disease are noted. Review of the MIP images confirms the above findings. IMPRESSION: 1. No evidence of pulmonary embolism. 2. Scattered airspace disease in the left upper and lower lobes, concerning for infiltrate. Short-term follow-up is recommended to exclude the possibility of underlying nodules. 3. Bilateral apical pleural scarring, greater on the left than on the right. Attention on follow-up is recommended. 4. Small left pleural effusion. 5. Multilevel degenerative changes and compression deformities in the thoracic spine with kyphosis. 6. The heart hiatal hernia. 7. Aortic atherosclerosis and scattered coronary artery calcifications. Electronically Signed   By: Brett Fairy M.D.   On: 08/17/2022 22:14     Labs:   Basic Metabolic Panel: Recent Labs  Lab 08/17/22 1817 08/17/22 1923 08/18/22 0458 08/18/22 0500 08/19/22 0547 08/20/22 0428  NA 134* 134* 133*  --  134* 130*  K 2.9* 3.1* 3.3*  --  3.4* 4.0  CL 96*  --  97*  --  99 96*  CO2 25  --  25  --  26 25  GLUCOSE 129*  --  112*  --  108* 109*  BUN 20  --  18  --  24* 13  CREATININE 0.91  --  0.90  --  0.79 0.83  CALCIUM 9.1  --  8.7*  --  8.7* 8.9  MG  --   --   --  2.1  --  2.2   GFR Estimated Creatinine Clearance: 38 mL/min (by C-G formula based on SCr of 0.83 mg/dL). Liver Function Tests: Recent Labs  Lab 08/17/22 1817 08/18/22 0458  AST 33 30  ALT 16 16  ALKPHOS 85 74  BILITOT 0.6 0.7  PROT 7.1 6.4*  ALBUMIN 3.7 3.3*   No results for input(s): "LIPASE", "AMYLASE" in the last 168 hours. No results for input(s): "AMMONIA" in the last 168 hours. Coagulation  profile No results for input(s): "INR", "PROTIME" in the last 168 hours.  CBC: Recent Labs  Lab 08/17/22 1817 08/17/22 1923 08/18/22 0458 08/20/22 0428  WBC 14.3*  --  14.8* 8.9  NEUTROABS  --   --  13.0*  --   HGB 12.3 12.6 11.4* 11.5*  HCT 37.1 37.0 35.7* 36.3  MCV 86.7  --  89.0 88.1  PLT 189  --  192 215   Cardiac Enzymes: No results for input(s): "CKTOTAL", "CKMB", "CKMBINDEX", "TROPONINI" in the last 168 hours. BNP: Invalid input(s): "POCBNP" CBG: No results for input(s): "GLUCAP" in the last 168 hours. D-Dimer No results for input(s): "DDIMER" in the last 72 hours. Hgb A1c No results for input(s): "HGBA1C" in the last 72 hours. Lipid Profile No results for input(s): "CHOL", "HDL", "LDLCALC", "TRIG", "CHOLHDL", "LDLDIRECT" in the last 72 hours. Thyroid function studies No results for input(s): "TSH", "T4TOTAL", "T3FREE", "THYROIDAB" in the last 72 hours.  Invalid input(s): "FREET3" Anemia work up No results for input(s): "VITAMINB12", "FOLATE", "FERRITIN", "TIBC", "IRON", "RETICCTPCT" in the last 72 hours. Microbiology Recent Results (  from the past 240 hour(s))  SARS CORONAVIRUS 2 (TAT 6-24 HRS) Anterior Nasal Swab     Status: None   Collection Time: 08/16/22  2:44 PM   Specimen: Anterior Nasal Swab  Result Value Ref Range Status   SARS Coronavirus 2 NEGATIVE NEGATIVE Final    Comment: (NOTE) SARS-CoV-2 target nucleic acids are NOT DETECTED.  The SARS-CoV-2 RNA is generally detectable in upper and lower respiratory specimens during the acute phase of infection. Negative results do not preclude SARS-CoV-2 infection, do not rule out co-infections with other pathogens, and should not be used as the sole basis for treatment or other patient management decisions. Negative results must be combined with clinical observations, patient history, and epidemiological information. The expected result is Negative.  Fact Sheet for  Patients: SugarRoll.be  Fact Sheet for Healthcare Providers: https://www.woods-mathews.com/  This test is not yet approved or cleared by the [place] FDA and  has been authorized for detection and/or diagnosis of SARS-CoV-2 by FDA under an Emergency Use Authorization (EUA). This EUA will remain  in effect (meaning this test can be used) for the duration of the COVID-19 declaration under Se ction 564(b)(1) of the Act, 21 U.S.C. section 360bbb-3(b)(1), unless the authorization is terminated or revoked sooner.  Performed at [facility], [place] [address] [place], [place] [address]   Respiratory (~20 pathogens) panel by PCR     Status: Abnormal   Collection Time: 08/18/22  4:27 AM   Specimen: Nasopharyngeal Swab; Respiratory  Result Value Ref Range Status   Adenovirus NOT DETECTED NOT DETECTED Final   Coronavirus 229E NOT DETECTED NOT DETECTED Final    Comment: (NOTE) The Coronavirus on the Respiratory Panel, DOES NOT test for the novel  Coronavirus (2019 nCoV)    Coronavirus HKU1 NOT DETECTED NOT DETECTED Final   Coronavirus NL63 NOT DETECTED NOT DETECTED Final   Coronavirus OC43 NOT DETECTED NOT DETECTED Final   Metapneumovirus NOT DETECTED NOT DETECTED Final   Rhinovirus / Enterovirus NOT DETECTED NOT DETECTED Final   Influenza A NOT DETECTED NOT DETECTED Final   Influenza B NOT DETECTED NOT DETECTED Final   Parainfluenza Virus 1 NOT DETECTED NOT DETECTED Final   Parainfluenza Virus 2 NOT DETECTED NOT DETECTED Final   Parainfluenza Virus 3 NOT DETECTED NOT DETECTED Final   Parainfluenza Virus 4 NOT DETECTED NOT DETECTED Final   Respiratory Syncytial Virus DETECTED (A) NOT DETECTED Final   Bordetella pertussis NOT DETECTED NOT DETECTED Final   Bordetella Parapertussis NOT DETECTED NOT DETECTED Final   Chlamydophila pneumoniae NOT DETECTED NOT DETECTED Final   Mycoplasma pneumoniae NOT DETECTED NOT DETECTED Final    Comment:  Performed at [facility], [address] [place], [place] [address]  SARS Coronavirus 2 by RT PCR (hospital order, performed in [facility] hospital lab) *cepheid single result test* Anterior Nasal Swab     Status: None   Collection Time: 08/18/22  2:36 PM   Specimen: Anterior Nasal Swab  Result Value Ref Range Status   SARS Coronavirus 2 by RT PCR NEGATIVE NEGATIVE Final    Comment: (NOTE) SARS-CoV-2 target nucleic acids are NOT DETECTED.  The SARS-CoV-2 RNA is generally detectable in upper and lower respiratory specimens during the acute phase of infection. The lowest concentration of SARS-CoV-2 viral copies this assay can detect is 250 copies / mL. A negative result does not preclude SARS-CoV-2 infection and should not be used as the sole basis for treatment or other patient management decisions.  A negative result may occur with improper specimen collection / handling, submission of specimen other than nasopharyngeal swab, presence of viral mutation(s) within the areas targeted by this assay, and inadequate number of viral copies (<250 copies / mL). A negative result must be combined with clinical observations, patient history, and epidemiological information.  Fact Sheet for Patients:   https://www.patel.info/  Fact Sheet for Healthcare Providers: https://hall.com/  This test is not yet approved or  cleared by the [place] FDA and has been authorized for detection and/or diagnosis of SARS-CoV-2 by FDA under an Emergency Use Authorization (EUA).  This EUA will remain in effect (meaning this test can be used) for the duration of the COVID-19 declaration under Section 564(b)(1) of the Act, 21 U.S.C. section 360bbb-3(b)(1), unless the authorization is terminated or revoked sooner.  Performed at [facility], [place] [address], [place], [place] [address]      Discharge Instructions:   Discharge Instructions      Diet general   Complete by: As directed    Increase activity slowly   Complete by: As directed       Allergies as of 08/20/2022       Reactions   Aspirin Other (See Comments)   Gets jitter with large doses   Doxycycline Diarrhea   Fish Allergy Nausea Only, Other (See Comments)   "makes me jumpy and twitchy, but grand children are allergic"    Nsaids    Can take for short periods of time. Naprosyn -feels dazed   Aloe Rash   Sulfa Antibiotics Rash        Medication List     TAKE these medications    albuterol (2.5 MG/3ML) 0.083% nebulizer solution Commonly known as: PROVENTIL Take 3 mLs (2.5 mg total) by nebulization every 4 (four) hours as needed for wheezing or shortness of breath.   apixaban 2.5 MG Tabs tablet Commonly known as: Eliquis Take 1 tablet (2.5 mg total) by mouth 2 (two) times daily.   benzonatate 100 MG capsule Commonly known as: TESSALON Take 1 capsule (100 mg total) by mouth 3 (three) times daily as needed for cough.   guaiFENesin-dextromethorphan 100-10 MG/5ML syrup Commonly known as: ROBITUSSIN DM Take 5 mLs by mouth every 4 (four) hours as needed for cough.   hydrochlorothiazide 12.5 MG tablet Commonly known as: HYDRODIURIL Take 12.5 mg by mouth daily.   ketoconazole 2 % cream Commonly known as: NIZORAL Apply 1 Application topically at bedtime. Apply to toes   memantine 10 MG tablet Commonly known as: NAMENDA Take 10 mg by mouth daily.   metoprolol succinate 25 MG 24 hr tablet Commonly known as: Toprol XL Take 1 tablet (25 mg total) by mouth daily.   senna-docusate 8.6-50 MG tablet Commonly known as: Senokot-S Take 1 tablet by mouth daily.   Vitamin D3 10 MCG (400 UNIT) Caps Take 400 Units by mouth in the morning, at noon, in the evening, and at bedtime.               Durable Medical Equipment  (From admission, onward)           Start     Ordered   08/20/22 1100  For home use only DME Nebulizer machine  Once        Question Answer Comment  Patient needs a nebulizer to treat with the following condition RSV infection   Length of Need 12 Months      08/20/22 1059  Follow-up Information     [facility], [place] Follow up.   Why: Malachy Mood from [facility] will call Belenda Cruise to schedule your first appointment Contact information: [place] [address] [phone]         Vernie Shanks, MD Follow up in 1 week(s).   Specialty: Family Medicine Contact information: [place] [address] [phone]                  Time coordinating discharge: 45 min  Signed:  Geradine Girt DO  [facility] 08/20/2022, 11:39 AM

## 2022-08-21 ENCOUNTER — Other Ambulatory Visit (HOSPITAL_COMMUNITY): Payer: Self-pay

## 2022-08-21 DIAGNOSIS — I10 Essential (primary) hypertension: Secondary | ICD-10-CM | POA: Diagnosis not present

## 2022-08-21 DIAGNOSIS — E876 Hypokalemia: Secondary | ICD-10-CM | POA: Diagnosis not present

## 2022-08-21 DIAGNOSIS — Z7901 Long term (current) use of anticoagulants: Secondary | ICD-10-CM | POA: Diagnosis not present

## 2022-08-21 DIAGNOSIS — C50912 Malignant neoplasm of unspecified site of left female breast: Secondary | ICD-10-CM | POA: Diagnosis not present

## 2022-08-21 DIAGNOSIS — I4891 Unspecified atrial fibrillation: Secondary | ICD-10-CM | POA: Diagnosis not present

## 2022-08-21 DIAGNOSIS — G3184 Mild cognitive impairment, so stated: Secondary | ICD-10-CM | POA: Diagnosis not present

## 2022-08-21 DIAGNOSIS — C679 Malignant neoplasm of bladder, unspecified: Secondary | ICD-10-CM | POA: Diagnosis not present

## 2022-08-21 DIAGNOSIS — M810 Age-related osteoporosis without current pathological fracture: Secondary | ICD-10-CM | POA: Diagnosis not present

## 2022-08-21 DIAGNOSIS — J9601 Acute respiratory failure with hypoxia: Secondary | ICD-10-CM | POA: Diagnosis not present

## 2022-08-21 DIAGNOSIS — Z9181 History of falling: Secondary | ICD-10-CM | POA: Diagnosis not present

## 2022-08-21 DIAGNOSIS — G540 Brachial plexus disorders: Secondary | ICD-10-CM | POA: Diagnosis not present

## 2022-08-21 DIAGNOSIS — J121 Respiratory syncytial virus pneumonia: Secondary | ICD-10-CM | POA: Diagnosis not present

## 2022-08-25 DIAGNOSIS — J9601 Acute respiratory failure with hypoxia: Secondary | ICD-10-CM | POA: Diagnosis not present

## 2022-08-25 DIAGNOSIS — E876 Hypokalemia: Secondary | ICD-10-CM | POA: Diagnosis not present

## 2022-08-25 DIAGNOSIS — J121 Respiratory syncytial virus pneumonia: Secondary | ICD-10-CM | POA: Diagnosis not present

## 2022-08-25 DIAGNOSIS — I10 Essential (primary) hypertension: Secondary | ICD-10-CM | POA: Diagnosis not present

## 2022-08-25 DIAGNOSIS — G3184 Mild cognitive impairment, so stated: Secondary | ICD-10-CM | POA: Diagnosis not present

## 2022-08-25 DIAGNOSIS — G540 Brachial plexus disorders: Secondary | ICD-10-CM | POA: Diagnosis not present

## 2022-08-28 DIAGNOSIS — G3184 Mild cognitive impairment, so stated: Secondary | ICD-10-CM | POA: Diagnosis not present

## 2022-08-28 DIAGNOSIS — G540 Brachial plexus disorders: Secondary | ICD-10-CM | POA: Diagnosis not present

## 2022-08-28 DIAGNOSIS — J9601 Acute respiratory failure with hypoxia: Secondary | ICD-10-CM | POA: Diagnosis not present

## 2022-08-28 DIAGNOSIS — E876 Hypokalemia: Secondary | ICD-10-CM | POA: Diagnosis not present

## 2022-08-28 DIAGNOSIS — J121 Respiratory syncytial virus pneumonia: Secondary | ICD-10-CM | POA: Diagnosis not present

## 2022-08-28 DIAGNOSIS — I10 Essential (primary) hypertension: Secondary | ICD-10-CM | POA: Diagnosis not present

## 2022-08-29 DIAGNOSIS — Z8619 Personal history of other infectious and parasitic diseases: Secondary | ICD-10-CM | POA: Diagnosis not present

## 2022-08-29 DIAGNOSIS — D510 Vitamin B12 deficiency anemia due to intrinsic factor deficiency: Secondary | ICD-10-CM | POA: Diagnosis not present

## 2022-08-29 DIAGNOSIS — Z6825 Body mass index (BMI) 25.0-25.9, adult: Secondary | ICD-10-CM | POA: Diagnosis not present

## 2022-08-29 DIAGNOSIS — R5381 Other malaise: Secondary | ICD-10-CM | POA: Diagnosis not present

## 2022-08-29 DIAGNOSIS — Z23 Encounter for immunization: Secondary | ICD-10-CM | POA: Diagnosis not present

## 2022-08-29 DIAGNOSIS — Z8709 Personal history of other diseases of the respiratory system: Secondary | ICD-10-CM | POA: Diagnosis not present

## 2022-08-29 DIAGNOSIS — E538 Deficiency of other specified B group vitamins: Secondary | ICD-10-CM | POA: Diagnosis not present

## 2022-09-10 DIAGNOSIS — J9601 Acute respiratory failure with hypoxia: Secondary | ICD-10-CM | POA: Diagnosis not present

## 2022-09-10 DIAGNOSIS — J121 Respiratory syncytial virus pneumonia: Secondary | ICD-10-CM | POA: Diagnosis not present

## 2022-09-10 DIAGNOSIS — G540 Brachial plexus disorders: Secondary | ICD-10-CM | POA: Diagnosis not present

## 2022-09-10 DIAGNOSIS — E876 Hypokalemia: Secondary | ICD-10-CM | POA: Diagnosis not present

## 2022-09-10 DIAGNOSIS — I10 Essential (primary) hypertension: Secondary | ICD-10-CM | POA: Diagnosis not present

## 2022-09-10 DIAGNOSIS — G3184 Mild cognitive impairment, so stated: Secondary | ICD-10-CM | POA: Diagnosis not present

## 2022-09-12 DIAGNOSIS — G540 Brachial plexus disorders: Secondary | ICD-10-CM | POA: Diagnosis not present

## 2022-09-12 DIAGNOSIS — E876 Hypokalemia: Secondary | ICD-10-CM | POA: Diagnosis not present

## 2022-09-12 DIAGNOSIS — I10 Essential (primary) hypertension: Secondary | ICD-10-CM | POA: Diagnosis not present

## 2022-09-12 DIAGNOSIS — J121 Respiratory syncytial virus pneumonia: Secondary | ICD-10-CM | POA: Diagnosis not present

## 2022-09-12 DIAGNOSIS — J9601 Acute respiratory failure with hypoxia: Secondary | ICD-10-CM | POA: Diagnosis not present

## 2022-09-12 DIAGNOSIS — G3184 Mild cognitive impairment, so stated: Secondary | ICD-10-CM | POA: Diagnosis not present

## 2022-09-19 DIAGNOSIS — E876 Hypokalemia: Secondary | ICD-10-CM | POA: Diagnosis not present

## 2022-09-19 DIAGNOSIS — G540 Brachial plexus disorders: Secondary | ICD-10-CM | POA: Diagnosis not present

## 2022-09-19 DIAGNOSIS — J9601 Acute respiratory failure with hypoxia: Secondary | ICD-10-CM | POA: Diagnosis not present

## 2022-09-19 DIAGNOSIS — J121 Respiratory syncytial virus pneumonia: Secondary | ICD-10-CM | POA: Diagnosis not present

## 2022-09-19 DIAGNOSIS — G3184 Mild cognitive impairment, so stated: Secondary | ICD-10-CM | POA: Diagnosis not present

## 2022-09-19 DIAGNOSIS — I10 Essential (primary) hypertension: Secondary | ICD-10-CM | POA: Diagnosis not present

## 2022-09-20 DIAGNOSIS — I10 Essential (primary) hypertension: Secondary | ICD-10-CM | POA: Diagnosis not present

## 2022-09-20 DIAGNOSIS — C679 Malignant neoplasm of bladder, unspecified: Secondary | ICD-10-CM | POA: Diagnosis not present

## 2022-09-20 DIAGNOSIS — Z7901 Long term (current) use of anticoagulants: Secondary | ICD-10-CM | POA: Diagnosis not present

## 2022-09-20 DIAGNOSIS — G540 Brachial plexus disorders: Secondary | ICD-10-CM | POA: Diagnosis not present

## 2022-09-20 DIAGNOSIS — J121 Respiratory syncytial virus pneumonia: Secondary | ICD-10-CM | POA: Diagnosis not present

## 2022-09-20 DIAGNOSIS — I4891 Unspecified atrial fibrillation: Secondary | ICD-10-CM | POA: Diagnosis not present

## 2022-09-20 DIAGNOSIS — C50912 Malignant neoplasm of unspecified site of left female breast: Secondary | ICD-10-CM | POA: Diagnosis not present

## 2022-09-20 DIAGNOSIS — G3184 Mild cognitive impairment, so stated: Secondary | ICD-10-CM | POA: Diagnosis not present

## 2022-09-20 DIAGNOSIS — M810 Age-related osteoporosis without current pathological fracture: Secondary | ICD-10-CM | POA: Diagnosis not present

## 2022-09-20 DIAGNOSIS — J9601 Acute respiratory failure with hypoxia: Secondary | ICD-10-CM | POA: Diagnosis not present

## 2022-09-20 DIAGNOSIS — E876 Hypokalemia: Secondary | ICD-10-CM | POA: Diagnosis not present

## 2022-09-20 DIAGNOSIS — Z9181 History of falling: Secondary | ICD-10-CM | POA: Diagnosis not present

## 2022-09-24 DIAGNOSIS — G540 Brachial plexus disorders: Secondary | ICD-10-CM | POA: Diagnosis not present

## 2022-09-24 DIAGNOSIS — G3184 Mild cognitive impairment, so stated: Secondary | ICD-10-CM | POA: Diagnosis not present

## 2022-09-24 DIAGNOSIS — J9601 Acute respiratory failure with hypoxia: Secondary | ICD-10-CM | POA: Diagnosis not present

## 2022-09-24 DIAGNOSIS — E876 Hypokalemia: Secondary | ICD-10-CM | POA: Diagnosis not present

## 2022-09-24 DIAGNOSIS — J121 Respiratory syncytial virus pneumonia: Secondary | ICD-10-CM | POA: Diagnosis not present

## 2022-09-24 DIAGNOSIS — I10 Essential (primary) hypertension: Secondary | ICD-10-CM | POA: Diagnosis not present

## 2022-09-26 DIAGNOSIS — G3184 Mild cognitive impairment, so stated: Secondary | ICD-10-CM | POA: Diagnosis not present

## 2022-09-29 DIAGNOSIS — J9601 Acute respiratory failure with hypoxia: Secondary | ICD-10-CM | POA: Diagnosis not present

## 2022-09-29 DIAGNOSIS — G3184 Mild cognitive impairment, so stated: Secondary | ICD-10-CM | POA: Diagnosis not present

## 2022-09-29 DIAGNOSIS — G540 Brachial plexus disorders: Secondary | ICD-10-CM | POA: Diagnosis not present

## 2022-09-29 DIAGNOSIS — E876 Hypokalemia: Secondary | ICD-10-CM | POA: Diagnosis not present

## 2022-09-29 DIAGNOSIS — J121 Respiratory syncytial virus pneumonia: Secondary | ICD-10-CM | POA: Diagnosis not present

## 2022-09-29 DIAGNOSIS — I10 Essential (primary) hypertension: Secondary | ICD-10-CM | POA: Diagnosis not present

## 2022-09-30 DIAGNOSIS — C672 Malignant neoplasm of lateral wall of bladder: Secondary | ICD-10-CM | POA: Diagnosis not present

## 2022-09-30 DIAGNOSIS — C679 Malignant neoplasm of bladder, unspecified: Secondary | ICD-10-CM | POA: Diagnosis not present

## 2022-09-30 DIAGNOSIS — Z8551 Personal history of malignant neoplasm of bladder: Secondary | ICD-10-CM | POA: Diagnosis not present

## 2022-10-01 DIAGNOSIS — D510 Vitamin B12 deficiency anemia due to intrinsic factor deficiency: Secondary | ICD-10-CM | POA: Diagnosis not present

## 2022-10-06 DIAGNOSIS — J121 Respiratory syncytial virus pneumonia: Secondary | ICD-10-CM | POA: Diagnosis not present

## 2022-10-06 DIAGNOSIS — I10 Essential (primary) hypertension: Secondary | ICD-10-CM | POA: Diagnosis not present

## 2022-10-06 DIAGNOSIS — E876 Hypokalemia: Secondary | ICD-10-CM | POA: Diagnosis not present

## 2022-10-06 DIAGNOSIS — G3184 Mild cognitive impairment, so stated: Secondary | ICD-10-CM | POA: Diagnosis not present

## 2022-10-06 DIAGNOSIS — G540 Brachial plexus disorders: Secondary | ICD-10-CM | POA: Diagnosis not present

## 2022-10-06 DIAGNOSIS — J9601 Acute respiratory failure with hypoxia: Secondary | ICD-10-CM | POA: Diagnosis not present

## 2022-10-13 DIAGNOSIS — J9601 Acute respiratory failure with hypoxia: Secondary | ICD-10-CM | POA: Diagnosis not present

## 2022-10-13 DIAGNOSIS — E876 Hypokalemia: Secondary | ICD-10-CM | POA: Diagnosis not present

## 2022-10-13 DIAGNOSIS — G3184 Mild cognitive impairment, so stated: Secondary | ICD-10-CM | POA: Diagnosis not present

## 2022-10-13 DIAGNOSIS — I10 Essential (primary) hypertension: Secondary | ICD-10-CM | POA: Diagnosis not present

## 2022-10-13 DIAGNOSIS — G540 Brachial plexus disorders: Secondary | ICD-10-CM | POA: Diagnosis not present

## 2022-10-13 DIAGNOSIS — J121 Respiratory syncytial virus pneumonia: Secondary | ICD-10-CM | POA: Diagnosis not present

## 2022-10-18 ENCOUNTER — Other Ambulatory Visit: Payer: Self-pay | Admitting: Internal Medicine

## 2022-10-27 ENCOUNTER — Ambulatory Visit: Payer: Medicare Other | Admitting: Internal Medicine

## 2022-10-29 ENCOUNTER — Ambulatory Visit: Payer: Medicare Other | Attending: Internal Medicine | Admitting: Internal Medicine

## 2022-10-29 VITALS — BP 99/63 | HR 90 | Ht 63.0 in | Wt 121.0 lb

## 2022-10-29 DIAGNOSIS — I4891 Unspecified atrial fibrillation: Secondary | ICD-10-CM | POA: Diagnosis not present

## 2022-10-29 NOTE — Progress Notes (Signed)
Cardiology Office Note:    Date:  10/29/2022   ID:  Gina Mcgee, DOB [birth year], MRN [id number]  PCP:  Gina Shanks, MD (Inactive)   [facility] Providers Cardiologist:  Gina Mayo, MD     Referring MD: No ref. provider found   No chief complaint on file. Low energy  History of Present Illness:    Gina Mcgee is a 89 y.o. female with a hx of HTN, basal cell carcinoma 06/2020, left sided breast cancer,  brachial plexus neuropathy, referral to see if a "medication can help the activities of her heart", low energy  Her daughter notes that she skips heart beats. She feels dizzy. This has been for the last year. This has been going on for the last 3 months.  She is asymptomatic. She feels like she's floating. Her EKG shows rate controlled atrial fibrillation. Atrial fibrillation is a new diagnosis. She has some chest discomfort and diagnosed with hiatal hernia. 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 This was diagnosed at [facility].  She has no known other cardiac hx. No stress tests. She does not recall echo, a cardiac scan is noted in care everywhere with no results.  She's had bladder cancer s/p treatment with intravesical BCG. She had lumpectomy/XRT for breast cancer in 1980s.TSH 1.7. No snoring. No hx of bleeding. She has had balance issues in the past with fractures.  Crt 0.8 93 kg  Interim Hx 5/2 Her echo showed normal LV fxn. RV systolic fxn mildly reduced. Mild MR. Mild AI. No evidence of elevated LVEDP.  She denies LH or dizziness. Has some weakness. The metop has helped her " floated symptoms" . No orthopnea or PND. No LE edema.   Interim Hx 11/2 No recent hospitalizations.  Notes some leg swelling after a flight. Improves with putting her legs up. No orthopnea/PND.   Interim hx 10/29/2022 Had RSV , admitted 08/17/2022-08/20/2022. No palpitations. No syncope. Family is always here with her. She got a refill. Daughter has questions about  her feeling "swimmy headed".   Cardiology Studies: TTE 11/07/2021: EF 60-65%, RV fxn, mild MR   Past Medical History:  Diagnosis Date   A-fib [facility])    Brain concussion 09/22/1958   Cancer [facility])    left-sided breast cancer   Concussion 09/22/1958   with left-sided neuro defecits   HTN (hypertension)    Osteoporosis     Past Surgical History:  Procedure Laterality Date   BOWEL RESECTION  approx. in 2007   polyp and formed stricture   BREAST LUMPECTOMY  1981   left lumpectomy w/ radiation   COLONOSCOPY W/ POLYPECTOMY     FEMUR SURGERY  2011   left femur pinning   JOINT REPLACEMENT     ORIF HUMERUS FRACTURE Left 01/25/2013   Procedure: OPEN REDUCTION INTERNAL FIXATION (ORIF) DISTAL HUMERUS FRACTURE REPAIR RECONSTRUCTION AND ULNA NERVE DECOMPRESSION AND ANTERIOR TRANSPOSITION AS NECESSARY;  Surgeon: Roseanne Kaufman, MD;  Location: [place];  Service: Orthopedics;  Laterality: Left;    Current Medications: Current Meds  Medication Sig   apixaban (ELIQUIS) 2.5 MG TABS tablet Take 1 tablet (2.5 mg total) by mouth 2 (two) times daily.   Cholecalciferol (VITAMIN D3) 10 MCG (400 UNIT) CAPS Take 400 Units by mouth in the morning, at noon, in the evening, and at bedtime.   hydrochlorothiazide (HYDRODIURIL) 12.5 MG tablet Take 12.5 mg by mouth daily.   ketoconazole (NIZORAL) 2 % cream Apply 1 Application topically  at bedtime. Apply to toes   memantine (NAMENDA) 10 MG tablet Take 10 mg by mouth daily.   metoprolol succinate (TOPROL-XL) 25 MG 24 hr tablet TAKE 1 TABLET(25 MG) BY MOUTH DAILY   senna-docusate (SENOKOT-S) 8.6-50 MG tablet Take 1 tablet by mouth daily.     Allergies:   Aspirin, Doxycycline, Fish allergy, Nsaids, Aloe, and Sulfa antibiotics   Social History   Socioeconomic History   Marital status: Widowed    Spouse name: Not on file   Number of children: Not on file   Years of education: Not on file   Highest education level: Not on file  Occupational History   Not on  file  Tobacco Use   Smoking status: Never   Smokeless tobacco: Never  Vaping Use   Vaping Use: Never used  Substance and Sexual Activity   Alcohol use: Not Currently   Drug use: No   Sexual activity: Not on file  Other Topics Concern   Not on file  Social History Narrative   Not on file   Social Determinants of Health   Financial Resource Strain: Not on file  Food Insecurity: No Food Insecurity (08/19/2022)   Hunger Vital Sign    Worried About Running Out of Food in the Last Year: Never true    Ran Out of Food in the Last Year: Never true  Transportation Needs: No Transportation Needs (08/19/2022)   PRAPARE - Hydrologist (Medical): No    Lack of Transportation (Non-Medical): No  Physical Activity: Not on file  Stress: Not on file  Social Connections: Not on file     Family History: The patient's family history includes Lung cancer in her father; Stroke in her father and mother.  ROS:   Please see the history of present illness.     All other systems reviewed and are negative.  EKGs/Labs/Other Studies Reviewed:    The following studies were reviewed today:   EKG:  EKG is  ordered today.  The ekg ordered today demonstrates   Atrial fibrillation rate 90s  07/24/2022- sinus rhythm with sinus arrhythmia  Recent Labs: 07/24/2022: BNP 111.3 08/18/2022: ALT 16 08/20/2022: BUN 13; Creatinine, Ser 0.83; Hemoglobin 11.5; Magnesium 2.2; Platelets 215; Potassium 4.0; Sodium 130  Recent Lipid Panel No results found for: "CHOL", "TRIG", "HDL", "CHOLHDL", "VLDL", "LDLCALC", "LDLDIRECT"   Risk Assessment/Calculations:     CHA2DS2-VASc Score = 4   This indicates a 4.8% annual risk of stroke. The patient's score is based upon: CHF History: 0 HTN History: 1 Diabetes History: 0 Stroke History: 0 Vascular Disease History: 0 Age Score: 2 Gender Score: 1            Physical Exam:    VS:   Vitals:   10/29/22 1006  BP: 99/63  Pulse:  90  SpO2: 97%    Wt Readings from Last 3 Encounters:  10/29/22 121 lb (54.9 kg)  08/19/22 118 lb (53.5 kg)  07/24/22 125 lb 6.4 oz (56.9 kg)     GEN:  Well nourished, frail sitting in a wheel chair HEENT: Normal NECK: No JVD; No carotid bruits LYMPHATICS: No lymphadenopathy CARDIAC: regular rate and rhythm, no murmurs, rubs, gallops RESPIRATORY:  Clear to auscultation without rales, wheezing or rhonchi  ABDOMEN: Soft, non-tender, non-distended MUSCULOSKELETAL:  No edema; No deformity  SKIN: Warm and dry NEUROLOGIC:  Alert and oriented x 3; left sided weakness in the UE PSYCHIATRIC:  Normal affect   ASSESSMENT:    #  Paroxysmal Atrial Fibrillation:  Chads2vasc 4. New onset Jan 2023.Marland Kitchen She has not hx of thyroid, liver or lung dx.  No hx of sleep apnea. She has no known cardiac disease. We discussed plan for rate control for now. Can consider DCCV/amiodarone if she has progressive symptoms or develops heart failure. She's maintained on eliquis. She has had falls but no significant bleeds on eliquis, will continue for now. If develops renal dysfunction or weight drops will need to reduce the dose. LV function is normal.  -continue eliquis 2.5 mg BID -continue metoprolol 25 mg XL   #CardioOnc: Undergoing BCG tx. No cardiac limitations with this. No signs of radiation induced  valvular fibrosis.   PLAN:    In order of problems listed above:    No changes Follow up in 6 months        Medication Adjustments/Labs and Tests Ordered: Current medicines are reviewed at length with the patient today.  Concerns regarding medicines are outlined above.  No orders of the defined types were placed in this encounter.  No orders of the defined types were placed in this encounter.   Patient Instructions  Medication Instructions:  No Changes In Medications at this time.  *If you need a refill on your cardiac medications before your next appointment, please call your pharmacy*  Lab  Work: None Ordered At This Time.  If you have labs (blood work) drawn today and your tests are completely normal, you will receive your results only by: [facility] (if you have MyChart) OR A paper copy in the mail If you have any lab test that is abnormal or we need to change your treatment, we will call you to review the results.  Testing/Procedures: None Ordered At This Time.   Follow-Up: At [facility], you and your health needs are our priority.  As part of our continuing mission to provide you with exceptional heart care, we have created designated Provider Care Teams.  These Care Teams include your primary Cardiologist (physician) and Advanced Practice Providers (APPs -  Physician Assistants and Nurse Practitioners) who all work together to provide you with the care you need, when you need it.  Your next appointment:   6 month(s)  Provider:   Janina Mayo, MD      Signed, Gina Mayo, MD  10/29/2022 11:10 AM    [place]

## 2022-10-29 NOTE — Patient Instructions (Signed)
Medication Instructions:  No Changes In Medications at this time.  *If you need a refill on your cardiac medications before your next appointment, please call your pharmacy*  Lab Work: None Ordered At This Time.  If you have labs (blood work) drawn today and your tests are completely normal, you will receive your results only by: MyChart Message (if you have MyChart) OR A paper copy in the mail If you have any lab test that is abnormal or we need to change your treatment, we will call you to review the results.  Testing/Procedures: None Ordered At This Time.   Follow-Up: At [facility], you and your health needs are our priority.  As part of our continuing mission to provide you with exceptional heart care, we have created designated Provider Care Teams.  These Care Teams include your primary Cardiologist (physician) and Advanced Practice Providers (APPs -  Physician Assistants and Nurse Practitioners) who all work together to provide you with the care you need, when you need it.    Your next appointment:   6 month(s)  Provider:   Branch, Mary E, MD    

## 2022-10-30 DIAGNOSIS — E538 Deficiency of other specified B group vitamins: Secondary | ICD-10-CM | POA: Diagnosis not present

## 2022-11-12 DIAGNOSIS — I10 Essential (primary) hypertension: Secondary | ICD-10-CM | POA: Diagnosis not present

## 2022-11-12 DIAGNOSIS — G3184 Mild cognitive impairment, so stated: Secondary | ICD-10-CM | POA: Diagnosis not present

## 2022-11-12 DIAGNOSIS — E538 Deficiency of other specified B group vitamins: Secondary | ICD-10-CM | POA: Diagnosis not present

## 2022-11-12 DIAGNOSIS — C679 Malignant neoplasm of bladder, unspecified: Secondary | ICD-10-CM | POA: Diagnosis not present

## 2022-11-12 DIAGNOSIS — K219 Gastro-esophageal reflux disease without esophagitis: Secondary | ICD-10-CM | POA: Diagnosis not present

## 2022-11-12 DIAGNOSIS — M810 Age-related osteoporosis without current pathological fracture: Secondary | ICD-10-CM | POA: Diagnosis not present

## 2022-11-12 DIAGNOSIS — I4891 Unspecified atrial fibrillation: Secondary | ICD-10-CM | POA: Diagnosis not present

## 2022-11-12 DIAGNOSIS — E559 Vitamin D deficiency, unspecified: Secondary | ICD-10-CM | POA: Diagnosis not present

## 2022-12-04 ENCOUNTER — Ambulatory Visit: Payer: BLUE CROSS/BLUE SHIELD | Admitting: Cardiology

## 2022-12-05 DIAGNOSIS — E538 Deficiency of other specified B group vitamins: Secondary | ICD-10-CM | POA: Diagnosis not present

## 2022-12-09 NOTE — Progress Notes (Unsigned)
Patient referred by Cari Caraway, MD for Atrial Fibrillation   Subjective:   Gina Mcgee, female    DOB: Jan 21, 1930, 89 y.o.   MRN: [id number]  *** No chief complaint on file.   HPI  89 y.o.  female with a PMH significant for but not limited to Afib, HTN, Breast CA, and dementia. She presents today to establish care and cardiac management of afib. Pt was previously seeing Dr Harl Bowie at [facility]. Of note, patient was diagnosed with afib in January 2023. She has been on Toprol 25 mg daily and Eliquis 2.5 mg BID. CHA2DS-VASc score of 4. She is on HCTZ 12.5 mg daily for HTN and home blood pressures are noted to be around 110/70. She does not have hx of thyroid, liver or lung dx. No hx of sleep apnea. She has no known cardiac disease.   Today she***  *** Past Medical History:  Diagnosis Date   A-fib ([place])    Brain concussion 09/22/1958   Cancer [facility])    left-sided breast cancer   Concussion 09/22/1958   with left-sided neuro defecits   HTN (hypertension)    Osteoporosis     *** Past Surgical History:  Procedure Laterality Date   BOWEL RESECTION  approx. in 2007   polyp and formed stricture   BREAST LUMPECTOMY  1981   left lumpectomy w/ radiation   COLONOSCOPY W/ POLYPECTOMY     FEMUR SURGERY  2011   left femur pinning   JOINT REPLACEMENT     ORIF HUMERUS FRACTURE Left 01/25/2013   Procedure: OPEN REDUCTION INTERNAL FIXATION (ORIF) DISTAL HUMERUS FRACTURE REPAIR RECONSTRUCTION AND ULNA NERVE DECOMPRESSION AND ANTERIOR TRANSPOSITION AS NECESSARY;  Surgeon: Roseanne Kaufman, MD;  Location: [facility];  Service: Orthopedics;  Laterality: Left;    *** Social History   Tobacco Use  Smoking Status Never  Smokeless Tobacco Never    Social History   Substance and Sexual Activity  Alcohol Use Not Currently    *** Family History  Problem Relation Age of Onset   Stroke Mother    Lung cancer Father    Stroke Father     ***  Current Outpatient Medications:    apixaban  (ELIQUIS) 2.5 MG TABS tablet, Take 1 tablet (2.5 mg total) by mouth 2 (two) times daily., Disp: 60 tablet, Rfl: 5   Cholecalciferol (VITAMIN D3) 10 MCG (400 UNIT) CAPS, Take 400 Units by mouth in the morning, at noon, in the evening, and at bedtime., Disp: , Rfl:    hydrochlorothiazide (HYDRODIURIL) 12.5 MG tablet, Take 12.5 mg by mouth daily., Disp: , Rfl:    ketoconazole (NIZORAL) 2 % cream, Apply 1 Application topically at bedtime. Apply to toes, Disp: , Rfl:    memantine (NAMENDA) 10 MG tablet, Take 10 mg by mouth daily., Disp: , Rfl:    metoprolol succinate (TOPROL-XL) 25 MG 24 hr tablet, TAKE 1 TABLET(25 MG) BY MOUTH DAILY, Disp: 30 tablet, Rfl: 3   senna-docusate (SENOKOT-S) 8.6-50 MG tablet, Take 1 tablet by mouth daily., Disp: , Rfl:    Cardiovascular and other pertinent studies:  Echocardiogram 08/13/2022:  1. Limited echo for edema   2. Left ventricular ejection fraction, by estimation, is 60 to 65%. Left  ventricular ejection fraction by PLAX is 65 %. The left ventricle has  normal function. The left ventricle has no regional wall motion  abnormalities. Left ventricular diastolic  parameters are consistent with Grade I diastolic dysfunction (impaired  relaxation).   3. The mitral  valve is abnormal. Trivial mitral valve regurgitation.   4. Aortic valve regurgitation is mild.   Reviewed external labs and tests, independently interpreted  *** EKG ***/***/202***: ***  ***  *** Recent labs: 11/12/2022: Glucose 88, BUN/Cr 25/0.95. EGFR 57. Na/K 136/4.5. Rest of the CMP normal 08/20/2021 H/H 11.5/36.3. MCV 88.1. Platelets 215 Chol ***, TG ***, HDL ***, LDL *** ***TSH ***normal   *** ROS      *** There were no vitals filed for this visit.   There is no height or weight on file to calculate BMI. There were no vitals filed for this visit.  CHA2DS2-VASc Score = 4   This indicates a 4.8% annual risk of stroke. The patient's score is based upon: CHF History:  0 HTN History: 1 Diabetes History: 0 Stroke History: 0 Vascular Disease History: 0 Age Score: 2 Gender Score: 1  Objective:   Physical Exam    ***     Visit diagnoses: No diagnosis found.   No orders of the defined types were placed in this encounter.    Medication changes this visit: There are no discontinued medications.  No orders of the defined types were placed in this encounter.    Assessment & Recommendations:   89 y.o.  female with a PMH significant for but not limited to Afib, HTN, Breast CA, and dementia. She presents today to establish care and cardiac management of afib. Pt was previously seeing Dr Harl Bowie at [facility]. Of note, patient was diagnosed with afib in January 2023. She has been on Toprol 25 mg daily and Eliquis 2.5 mg BID. CHA2DS-VASc score of 4. She is on HCTZ 12.5 mg daily for HTN and home blood pressures are noted to be around 110/70. She does not have hx of thyroid, liver or lung dx. No hx of sleep apnea. She has no known cardiac disease.     Paroxysmal Atrial Fibrillation *** EKG:  Plan - Continue Eliquis 2.5 mg BID - Continue Toprol XL 25 mg daily - Check H/H prior to next visit   Follow up in 6 months    Thank you for referring the patient to [place]. Please feel free to contact with any questions.    Nigel Mormon, MD Pager: [phone] Office: [phone]

## 2022-12-10 ENCOUNTER — Ambulatory Visit: Payer: Medicare Other | Admitting: Cardiology

## 2022-12-10 ENCOUNTER — Encounter: Payer: Self-pay | Admitting: Cardiology

## 2022-12-10 VITALS — BP 139/78 | HR 87 | Ht 63.0 in | Wt 124.6 lb

## 2022-12-10 DIAGNOSIS — I480 Paroxysmal atrial fibrillation: Secondary | ICD-10-CM

## 2022-12-10 DIAGNOSIS — I4891 Unspecified atrial fibrillation: Secondary | ICD-10-CM | POA: Diagnosis not present

## 2022-12-10 DIAGNOSIS — I10 Essential (primary) hypertension: Secondary | ICD-10-CM

## 2022-12-10 MED ORDER — APIXABAN 2.5 MG PO TABS: 2.5000 mg | ORAL_TABLET | Freq: Two times a day (BID) | ORAL | 3 refills | Status: DC

## 2022-12-10 MED ORDER — METOPROLOL SUCCINATE ER 25 MG PO TB24: ORAL_TABLET | ORAL | 3 refills | Status: DC

## 2022-12-11 LAB — HEMOGLOBIN AND HEMATOCRIT, BLOOD
Hematocrit: 36.8 % (ref 34.0–46.6)
Hemoglobin: 11.5 g/dL (ref 11.1–15.9)

## 2022-12-17 DIAGNOSIS — C672 Malignant neoplasm of lateral wall of bladder: Secondary | ICD-10-CM | POA: Diagnosis not present

## 2022-12-24 DIAGNOSIS — R8290 Unspecified abnormal findings in urine: Secondary | ICD-10-CM | POA: Diagnosis not present

## 2022-12-24 DIAGNOSIS — C672 Malignant neoplasm of lateral wall of bladder: Secondary | ICD-10-CM | POA: Diagnosis not present

## 2022-12-30 ENCOUNTER — Encounter (HOSPITAL_COMMUNITY): Payer: Self-pay

## 2022-12-30 ENCOUNTER — Emergency Department (HOSPITAL_COMMUNITY): Payer: Medicare Other

## 2022-12-30 ENCOUNTER — Inpatient Hospital Stay (HOSPITAL_COMMUNITY)
Admission: EM | Admit: 2022-12-30 | Discharge: 2023-01-02 | DRG: 689 | Disposition: A | Discharge status: Home / Self Care | Payer: Medicare Other | Attending: Internal Medicine | Admitting: Internal Medicine

## 2022-12-30 DIAGNOSIS — I4819 Other persistent atrial fibrillation: Secondary | ICD-10-CM | POA: Diagnosis not present

## 2022-12-30 DIAGNOSIS — D72829 Elevated white blood cell count, unspecified: Secondary | ICD-10-CM | POA: Diagnosis not present

## 2022-12-30 DIAGNOSIS — I480 Paroxysmal atrial fibrillation: Secondary | ICD-10-CM | POA: Diagnosis not present

## 2022-12-30 DIAGNOSIS — D649 Anemia, unspecified: Secondary | ICD-10-CM

## 2022-12-30 DIAGNOSIS — R9431 Abnormal electrocardiogram [ECG] [EKG]: Secondary | ICD-10-CM | POA: Diagnosis not present

## 2022-12-30 DIAGNOSIS — D630 Anemia in neoplastic disease: Secondary | ICD-10-CM | POA: Diagnosis present

## 2022-12-30 DIAGNOSIS — Z91013 Allergy to seafood: Secondary | ICD-10-CM

## 2022-12-30 DIAGNOSIS — R7881 Bacteremia: Secondary | ICD-10-CM | POA: Diagnosis not present

## 2022-12-30 DIAGNOSIS — N3001 Acute cystitis with hematuria: Secondary | ICD-10-CM | POA: Diagnosis present

## 2022-12-30 DIAGNOSIS — I959 Hypotension, unspecified: Secondary | ICD-10-CM | POA: Diagnosis not present

## 2022-12-30 DIAGNOSIS — R410 Disorientation, unspecified: Secondary | ICD-10-CM | POA: Diagnosis not present

## 2022-12-30 DIAGNOSIS — Z881 Allergy status to other antibiotic agents status: Secondary | ICD-10-CM | POA: Diagnosis not present

## 2022-12-30 DIAGNOSIS — N390 Urinary tract infection, site not specified: Secondary | ICD-10-CM | POA: Diagnosis present

## 2022-12-30 DIAGNOSIS — Z79899 Other long term (current) drug therapy: Secondary | ICD-10-CM

## 2022-12-30 DIAGNOSIS — Z882 Allergy status to sulfonamides status: Secondary | ICD-10-CM | POA: Diagnosis not present

## 2022-12-30 DIAGNOSIS — Z7901 Long term (current) use of anticoagulants: Secondary | ICD-10-CM

## 2022-12-30 DIAGNOSIS — R531 Weakness: Secondary | ICD-10-CM | POA: Diagnosis present

## 2022-12-30 DIAGNOSIS — M810 Age-related osteoporosis without current pathological fracture: Secondary | ICD-10-CM | POA: Diagnosis present

## 2022-12-30 DIAGNOSIS — G9341 Metabolic encephalopathy: Secondary | ICD-10-CM | POA: Diagnosis not present

## 2022-12-30 DIAGNOSIS — Z801 Family history of malignant neoplasm of trachea, bronchus and lung: Secondary | ICD-10-CM

## 2022-12-30 DIAGNOSIS — F03A0 Unspecified dementia, mild, without behavioral disturbance, psychotic disturbance, mood disturbance, and anxiety: Secondary | ICD-10-CM | POA: Diagnosis present

## 2022-12-30 DIAGNOSIS — E876 Hypokalemia: Secondary | ICD-10-CM | POA: Diagnosis not present

## 2022-12-30 DIAGNOSIS — Z87898 Personal history of other specified conditions: Secondary | ICD-10-CM | POA: Diagnosis present

## 2022-12-30 DIAGNOSIS — I4891 Unspecified atrial fibrillation: Secondary | ICD-10-CM | POA: Diagnosis not present

## 2022-12-30 DIAGNOSIS — G9340 Encephalopathy, unspecified: Secondary | ICD-10-CM | POA: Insufficient documentation

## 2022-12-30 DIAGNOSIS — I10 Essential (primary) hypertension: Secondary | ICD-10-CM | POA: Diagnosis present

## 2022-12-30 DIAGNOSIS — Z9049 Acquired absence of other specified parts of digestive tract: Secondary | ICD-10-CM | POA: Diagnosis not present

## 2022-12-30 DIAGNOSIS — B9620 Unspecified Escherichia coli [E. coli] as the cause of diseases classified elsewhere: Secondary | ICD-10-CM | POA: Diagnosis present

## 2022-12-30 DIAGNOSIS — Z1152 Encounter for screening for COVID-19: Secondary | ICD-10-CM

## 2022-12-30 DIAGNOSIS — Z886 Allergy status to analgesic agent status: Secondary | ICD-10-CM | POA: Diagnosis not present

## 2022-12-30 DIAGNOSIS — R110 Nausea: Secondary | ICD-10-CM | POA: Diagnosis not present

## 2022-12-30 DIAGNOSIS — Z823 Family history of stroke: Secondary | ICD-10-CM | POA: Diagnosis not present

## 2022-12-30 DIAGNOSIS — C679 Malignant neoplasm of bladder, unspecified: Secondary | ICD-10-CM | POA: Diagnosis present

## 2022-12-30 DIAGNOSIS — Z853 Personal history of malignant neoplasm of breast: Secondary | ICD-10-CM

## 2022-12-30 DIAGNOSIS — K449 Diaphragmatic hernia without obstruction or gangrene: Secondary | ICD-10-CM | POA: Diagnosis not present

## 2022-12-30 HISTORY — DX: Encephalopathy, unspecified: G93.40

## 2022-12-30 HISTORY — DX: Urinary tract infection, site not specified: N39.0

## 2022-12-30 LAB — CBC WITH DIFFERENTIAL/PLATELET
Abs Immature Granulocytes: 0.12 10*3/uL — ABNORMAL HIGH (ref 0.00–0.07)
Basophils Absolute: 0.1 10*3/uL (ref 0.0–0.1)
Basophils Relative: 0 %
Eosinophils Absolute: 0 10*3/uL (ref 0.0–0.5)
Eosinophils Relative: 0 %
HCT: 34 % — ABNORMAL LOW (ref 36.0–46.0)
Hemoglobin: 10.7 g/dL — ABNORMAL LOW (ref 12.0–15.0)
Immature Granulocytes: 1 %
Lymphocytes Relative: 2 %
Lymphs Abs: 0.4 10*3/uL — ABNORMAL LOW (ref 0.7–4.0)
MCH: 26.8 pg (ref 26.0–34.0)
MCHC: 31.5 g/dL (ref 30.0–36.0)
MCV: 85.2 fL (ref 80.0–100.0)
Monocytes Absolute: 1.2 10*3/uL — ABNORMAL HIGH (ref 0.1–1.0)
Monocytes Relative: 6 %
Neutro Abs: 17.7 10*3/uL — ABNORMAL HIGH (ref 1.7–7.7)
Neutrophils Relative %: 91 %
Platelets: 209 10*3/uL (ref 150–400)
RBC: 3.99 MIL/uL (ref 3.87–5.11)
RDW: 14.7 % (ref 11.5–15.5)
WBC: 19.5 10*3/uL — ABNORMAL HIGH (ref 4.0–10.5)
nRBC: 0 % (ref 0.0–0.2)

## 2022-12-30 LAB — URINALYSIS, W/ REFLEX TO CULTURE (INFECTION SUSPECTED)
Bilirubin Urine: NEGATIVE
Glucose, UA: NEGATIVE mg/dL
Ketones, ur: NEGATIVE mg/dL
Nitrite: POSITIVE — AB
Protein, ur: 30 mg/dL — AB
Specific Gravity, Urine: 1.012 (ref 1.005–1.030)
WBC, UA: >50 WBC/hpf (ref 0–5)
pH: 6 (ref 5.0–8.0)

## 2022-12-30 LAB — COMPREHENSIVE METABOLIC PANEL
ALT: 17 U/L (ref 0–44)
AST: 29 U/L (ref 15–41)
Albumin: 3.7 g/dL (ref 3.5–5.0)
Alkaline Phosphatase: 84 U/L (ref 38–126)
Anion gap: 11 (ref 5–15)
BUN: 25 mg/dL — ABNORMAL HIGH (ref 8–23)
CO2: 25 mmol/L (ref 22–32)
Calcium: 9.3 mg/dL (ref 8.9–10.3)
Chloride: 98 mmol/L (ref 98–111)
Creatinine, Ser: 1.06 mg/dL — ABNORMAL HIGH (ref 0.44–1.00)
GFR, Estimated: 50 mL/min — ABNORMAL LOW (ref >60)
Glucose, Bld: 135 mg/dL — ABNORMAL HIGH (ref 70–99)
Potassium: 3.3 mmol/L — ABNORMAL LOW (ref 3.5–5.1)
Sodium: 134 mmol/L — ABNORMAL LOW (ref 135–145)
Total Bilirubin: 0.6 mg/dL (ref 0.3–1.2)
Total Protein: 6.7 g/dL (ref 6.5–8.1)

## 2022-12-30 LAB — LACTIC ACID, PLASMA: Lactic Acid, Venous: 1.4 mmol/L (ref 0.5–1.9)

## 2022-12-30 LAB — RESP PANEL BY RT-PCR (RSV, FLU A&B, COVID)  RVPGX2
Influenza A by PCR: NEGATIVE
Influenza B by PCR: NEGATIVE
Resp Syncytial Virus by PCR: NEGATIVE
SARS Coronavirus 2 by RT PCR: NEGATIVE

## 2022-12-30 LAB — APTT: aPTT: 31 seconds (ref 24–36)

## 2022-12-30 LAB — PROTIME-INR
INR: 1.2 (ref 0.8–1.2)
Prothrombin Time: 14.8 seconds (ref 11.4–15.2)

## 2022-12-30 MED ORDER — ACETAMINOPHEN 650 MG RE SUPP: 650.0000 mg | Freq: Four times a day (QID) | RECTAL | Status: DC | PRN

## 2022-12-30 MED ORDER — SODIUM CHLORIDE 0.9 % IV SOLN: 1.0000 g | Freq: Once | INTRAVENOUS | Status: DC

## 2022-12-30 MED ORDER — APIXABAN 2.5 MG PO TABS
2.5000 mg | ORAL_TABLET | Freq: Two times a day (BID) | ORAL | Status: DC
  Administered 2022-12-30 – 2023-01-02 (×6): 2.5 mg via ORAL
  Filled 2022-12-30 (×6): qty 1

## 2022-12-30 MED ORDER — MEMANTINE HCL 10 MG PO TABS
10.0000 mg | ORAL_TABLET | Freq: Two times a day (BID) | ORAL | Status: DC
  Administered 2022-12-31 – 2023-01-02 (×5): 10 mg via ORAL
  Filled 2022-12-30 (×5): qty 1

## 2022-12-30 MED ORDER — ACETAMINOPHEN 325 MG PO TABS
650.0000 mg | ORAL_TABLET | Freq: Four times a day (QID) | ORAL | Status: DC | PRN
  Filled 2022-12-30: qty 2

## 2022-12-30 MED ORDER — SODIUM CHLORIDE 0.9 % IV SOLN
1.0000 g | Freq: Once | INTRAVENOUS | Status: AC
  Administered 2022-12-30: 1 g via INTRAVENOUS
  Filled 2022-12-30: qty 10

## 2022-12-30 MED ORDER — LACTATED RINGERS IV BOLUS
500.0000 mL | Freq: Once | INTRAVENOUS | Status: AC
  Administered 2022-12-30: 500 mL via INTRAVENOUS

## 2022-12-30 MED ORDER — METOPROLOL SUCCINATE ER 25 MG PO TB24
25.0000 mg | ORAL_TABLET | Freq: Every day | ORAL | Status: DC
  Administered 2023-01-01 – 2023-01-02 (×2): 25 mg via ORAL
  Filled 2022-12-30 (×3): qty 1

## 2022-12-30 MED ORDER — SENNOSIDES-DOCUSATE SODIUM 8.6-50 MG PO TABS: 1.0000 | ORAL_TABLET | Freq: Every evening | ORAL | Status: DC | PRN

## 2022-12-30 NOTE — H&P (Signed)
History and Physical    Patient: Gina BeagleHelen P Mcgee [id number] DOB: 05/11/1930 DOA: 12/30/2022 DOS: the patient was seen and examined on 12/30/2022 PCP: Patient, No Pcp Per  Patient coming from: {Point_of_Origin:[address]}  Chief Complaint:  Chief Complaint  Patient presents with   Weakness   HPI: Gina BeagleHelen P Mcgee is a 89 y.o. female with medical history significant of ***  Review of Systems: {ROS_Text:26778} Past Medical History:  Diagnosis Date   A-fib    Brain concussion 09/22/1958   Cancer    left-sided breast cancer   Concussion 09/22/1958   with left-sided neuro defecits   HTN (hypertension)    Osteoporosis    Past Surgical History:  Procedure Laterality Date   BOWEL RESECTION  approx. in 2007   polyp and formed stricture   BREAST LUMPECTOMY  1981   left lumpectomy w/ radiation   COLONOSCOPY W/ POLYPECTOMY     FEMUR SURGERY  2011   left femur pinning   JOINT REPLACEMENT     ORIF HUMERUS FRACTURE Left 01/25/2013   Procedure: OPEN REDUCTION INTERNAL FIXATION (ORIF) DISTAL HUMERUS FRACTURE REPAIR RECONSTRUCTION AND ULNA NERVE DECOMPRESSION AND ANTERIOR TRANSPOSITION AS NECESSARY;  Surgeon: Dominica SeverinWilliam Gramig, MD;  Location: [facility];  Service: Orthopedics;  Laterality: Left;   Social History:  reports that she has never smoked. She has never used smokeless tobacco. She reports that she does not currently use alcohol. She reports that she does not use drugs.  Allergies  Allergen Reactions   Aspirin Other (See Comments)    Gets jitter with large doses   Doxycycline Diarrhea   Fish Allergy Nausea Only and Other (See Comments)    "makes me jumpy and twitchy, but grand children are allergic"    Nsaids     Can take for short periods of time. Naprosyn -feels dazed   Shellfish-Derived Products Other (See Comments)    "makes me jumpy and twitchy, but grand children are allergic"   Aloe Rash   Sulfa Antibiotics Rash    Family History  Problem Relation Age of Onset   Stroke Mother     Lung cancer Father    Stroke Father     Prior to Admission medications   Medication Sig Start Date End Date Taking? Authorizing Provider  apixaban (ELIQUIS) 2.5 MG TABS tablet Take 1 tablet (2.5 mg total) by mouth 2 (two) times daily. 12/10/22   [name], Anabel BeneManish J, MD  Cholecalciferol (VITAMIN D3) 10 MCG (400 UNIT) CAPS Take 400 Units by mouth in the morning, at noon, in the evening, and at bedtime.    [provider]  hydrochlorothiazide (HYDRODIURIL) 12.5 MG tablet Take 12.5 mg by mouth daily.    [provider]  ketoconazole (NIZORAL) 2 % cream Apply 1 Application topically at bedtime. Apply to toes    [provider]  memantine (NAMENDA) 10 MG tablet Take 10 mg by mouth daily.    [provider]  metoprolol succinate (TOPROL-XL) 25 MG 24 hr tablet TAKE 1 TABLET(25 MG) BY MOUTH DAILY 12/10/22   [name], Manish J, MD  senna-docusate (SENOKOT-S) 8.6-50 MG tablet Take 1 tablet by mouth daily.    [provider]    Physical Exam: Vitals:   12/30/22 1830 12/30/22 1930 12/30/22 2105 12/30/22 2115  BP: 104/62 108/74  (!) 102/57  Pulse: 81 95  79  Resp: (!) 28 (!) 32  (!) 29  Temp:   99.1 F (37.3 C)   TempSrc:   Rectal   SpO2: 98% 100%  100%   *** Data Reviewed: {Tip this will not be part of the note when signed- Document your independent interpretation of telemetry tracing, EKG, lab, Radiology test or any other diagnostic tests. Add any new diagnostic test ordered today. (Optional):26781} {Results:26384}  Assessment and Plan: No notes have been filed under this hospital service. Service: Hospitalist     Advance Care Planning:   Code Status: Prior ***  Consults: ***  Family Communication: ***  Severity of Illness: {Observation/Inpatient:21159}  Author: Katha Cabal, DO 12/30/2022 10:21 PM  For on call review [email or web address].

## 2022-12-30 NOTE — Hospital Course (Signed)
Eliquis AFIB  Vit B HTN  12.5  Ketoconazole - infection  Namenda 10 mg  Metopro XL  25 g   Took morning meds this   Took caretaker she was feeling forgetful   Denies fever, headache, nasal congestion, recent fall,   Endorses cough, slight rhinorrhea, lower abdominal pain and confusion   Held BP meds HCTZ 12.5 mg

## 2022-12-30 NOTE — ED Provider Notes (Signed)
[place] EMERGENCY DEPARTMENT AT [facility] Provider Note   CSN: [id number] Arrival date & time: 12/30/22  1629     History  Chief Complaint  Patient presents with   Weakness    Gina Mcgee is a 89 y.o. female.  Patient is a 89 year old female with a history of paroxysmal atrial fibrillation on Eliquis, bladder cancer currently getting BCG treatments, hypertension who is presenting today with her daughter due to new onset generalized weakness, delirium and shortness of breath.  Daughter reports that she is undergoing her BCG treatments right now and had her last UA done on Wednesday which was slightly cloudy.  She was supposed to get treatment tomorrow but she went ahead and canceled because this week she seemed a little bit more rundown.  However today after taking a nap she woke up and she was hallucinating seeing her playing cards in mid air and was so weak she could not get out of bed.  Her daughter reports shortness of breath has been waxing and waning for a while now.  They are following up with cardiology for this and she seems to go in and out of A-fib.  The patient reports she feels slightly short of breath now and EMS noted that upon their arrival her O2 sat was 88% on room air and heart rate was between 92 and 120.  Patient denies any chest pain, abdominal pain, dysuria frequency or urgency.  Her daughter has not noticed a cough or nasal congestion.  Patient does not typically wear oxygen at home.  She does report feeling better with the oxygen on.  The history is provided by the patient, a relative, a caregiver and medical records.  Weakness      Home Medications Prior to Admission medications   Medication Sig Start Date End Date Taking? Authorizing Provider  apixaban (ELIQUIS) 2.5 MG TABS tablet Take 1 tablet (2.5 mg total) by mouth 2 (two) times daily. 12/10/22   [name], [name], MD  Cholecalciferol (VITAMIN D3) 10 MCG (400 UNIT) CAPS Take 400 Units by  mouth in the morning, at noon, in the evening, and at bedtime.    [provider]  hydrochlorothiazide (HYDRODIURIL) 12.5 MG tablet Take 12.5 mg by mouth daily.    [provider]  ketoconazole (NIZORAL) 2 % cream Apply 1 Application topically at bedtime. Apply to toes    [provider]  memantine (NAMENDA) 10 MG tablet Take 10 mg by mouth daily.    [provider]  metoprolol succinate (TOPROL-XL) 25 MG 24 hr tablet TAKE 1 TABLET(25 MG) BY MOUTH DAILY 12/10/22   [name], Manish J, MD  senna-docusate (SENOKOT-S) 8.6-50 MG tablet Take 1 tablet by mouth daily.    [provider]      Allergies    Aspirin, Doxycycline, Fish allergy, Nsaids, Shellfish-derived products, Aloe, and Sulfa antibiotics    Review of Systems   Review of Systems  Neurological:  Positive for weakness.    Physical Exam Updated Vital Signs BP (!) 102/57   Pulse 79   Temp 99.1 F (37.3 C) (Rectal)   Resp (!) 29   SpO2 100%  Physical Exam Vitals and nursing note reviewed.  Constitutional:      General: She is not in acute distress.    Appearance: She is well-developed.  HENT:     Head: Normocephalic and atraumatic.  Eyes:     Pupils: Pupils are equal, round, and reactive to light.  Cardiovascular:  Rate and Rhythm: Normal rate and regular rhythm. Occasional Extrasystoles are present.    Heart sounds: Normal heart sounds. No murmur heard.    No friction rub.  Pulmonary:     Effort: Pulmonary effort is normal. Tachypnea present.     Breath sounds: Normal breath sounds. No wheezing or rales.  Abdominal:     General: Bowel sounds are normal. There is no distension.     Palpations: Abdomen is soft.     Tenderness: There is no abdominal tenderness. There is no guarding or rebound.  Musculoskeletal:        General: No tenderness. Normal range of motion.     Right lower leg: No edema.     Left lower leg: No edema.     Comments: No edema  Skin:    General:  Skin is warm and dry.     Findings: No rash.  Neurological:     Mental Status: She is alert and oriented to person, place, and time. Mental status is at baseline.     Cranial Nerves: No cranial nerve deficit.  Psychiatric:        Behavior: Behavior normal.     ED Results / Procedures / Treatments   Labs (all labs ordered are listed, but only abnormal results are displayed) Labs Reviewed  COMPREHENSIVE METABOLIC PANEL - Abnormal; Notable for the following components:      Result Value   Sodium 134 (*)    Potassium 3.3 (*)    Glucose, Bld 135 (*)    BUN 25 (*)    Creatinine, Ser 1.06 (*)    GFR, Estimated 50 (*)    All other components within normal limits  CBC WITH DIFFERENTIAL/PLATELET - Abnormal; Notable for the following components:   WBC 19.5 (*)    Hemoglobin 10.7 (*)    HCT 34.0 (*)    Neutro Abs 17.7 (*)    Lymphs Abs 0.4 (*)    Monocytes Absolute 1.2 (*)    Abs Immature Granulocytes 0.12 (*)    All other components within normal limits  URINALYSIS, W/ REFLEX TO CULTURE (INFECTION SUSPECTED) - Abnormal; Notable for the following components:   APPearance CLOUDY (*)    Hgb urine dipstick MODERATE (*)    Protein, ur 30 (*)    Nitrite POSITIVE (*)    Leukocytes,Ua LARGE (*)    Bacteria, UA MANY (*)    All other components within normal limits  RESP PANEL BY RT-PCR (RSV, FLU A&B, COVID)  RVPGX2  CULTURE, BLOOD (ROUTINE X 2)  CULTURE, BLOOD (ROUTINE X 2)  URINE CULTURE  LACTIC ACID, PLASMA  PROTIME-INR  APTT  LACTIC ACID, PLASMA    EKG EKG Interpretation  Date/Time:  Tuesday December 30 2022 16:42:39 EDT Ventricular Rate:  92 PR Interval:  180 QRS Duration: 81 QT Interval:  364 QTC Calculation: 451 R Axis:   -19 Text Interpretation: Sinus or ectopic atrial rhythm Borderline left axis deviation Nonspecific T abnrm, anterolateral leads No significant change since last tracing Confirmed by Gwyneth Sproutlunkett, Zanyah Lentsch ([id number]) on 12/30/2022 6:37:19 PM  Radiology DG Chest  Port 1 View  Result Date: 12/30/2022 CLINICAL DATA:  Possible sepsis. EXAM: PORTABLE CHEST 1 VIEW COMPARISON:  08/16/2022. FINDINGS: Cardiac silhouette normal in size. Moderate hiatal hernia, stable. No mediastinal or hilar masses. Clear lungs.  No convincing pleural effusion or pneumothorax. Skeletal structures are diffusely demineralized. IMPRESSION: No acute cardiopulmonary disease. Electronically Signed   By: Amie Portlandavid  Ormond M.D.   On: 12/30/2022 17:37  Procedures Procedures    Medications Ordered in ED Medications  cefTRIAXone (ROCEPHIN) 1 g in sodium chloride 0.9 % 100 mL IVPB (has no administration in time range)  lactated ringers bolus 500 mL (0 mLs Intravenous Stopped 12/30/22 2115)    ED Course/ Medical Decision Making/ A&P                             Medical Decision Making Amount and/or Complexity of Data Reviewed Independent Historian: caregiver    Details: daughter External Data Reviewed: notes. Labs: ordered. Decision-making details documented in ED Course. Radiology: ordered and independent interpretation performed. Decision-making details documented in ED Course. ECG/medicine tests: ordered and independent interpretation performed. Decision-making details documented in ED Course.  Risk Decision regarding hospitalization.   Pt with multiple medical problems and comorbidities and presenting today with a complaint that caries a high risk for morbidity and mortality.  Here today with delirium and weakness.  Concern for possible developing infection and sepsis versus hypoxia from possible cardiac etiology.  Patient has no evidence of fluid overload today consistent with CHF.  She does not have chronic lung disease and has no wheezing today.  Patient was noted to be satting 88% on room air when EMS arrived.  Upon arriving here patient was on 3 L.  When oxygen was turned off sats dropped to 89 to 90% and patient became tachypneic in the 30s.  Blood pressure is in the low 100s  and upper 90s.  Undifferentiated sepsis was initiated.  I independently interpreted patient's EKG which shows a sinus rhythm with occasional PACs.  This is what is also on her continuous cardiac monitor in the room.  I have independently visualized and interpreted pt's images today. Chest x-ray today within normal limits.  No evidence of pneumothorax or pneumonia. I independently interpreted patient's labs today.  UA with concerns for inspection with positive nitrites, leukocytes and many bacteria with greater than 50 white blood cells which is much different than UA done Wednesday of last week.  Initial lactic acid was normal, CMP without acute findings with stable creatinine of 1.0, leukocytosis today of 19.5 with unchanged hemoglobin and viral panel is negative.  Patient was given IV Rocephin, culture is pending.  Given patient's weakness, delirium will admit for IV antibiotics.  Findings were discussed with the patient and her caregiver.  They are comfortable with this plan.        Final Clinical Impression(s) / ED Diagnoses Final diagnoses:  Weakness  Lower urinary tract infectious disease  Delirium    Rx / DC Orders ED Discharge Orders     None         Gwyneth Sprout, MD 12/30/22 2149

## 2022-12-30 NOTE — ED Triage Notes (Addendum)
Pt bib ems coming from home. Daughter reported that pt has been weak and confused starting around 1100 today. Symptoms similar to when she had rsv in December. 1episode emesis en route 4mg  zofran given by ems.  Reports mild dizziness. Denies nausea, pain, or cough at this time. Pt alert and oriented in triage.   Ems vitals: O2 88% room air; 94% on 2L Hr 92-120 a fib Cbg 103 Bp 119/64

## 2022-12-31 ENCOUNTER — Encounter (HOSPITAL_COMMUNITY): Payer: Self-pay | Admitting: Family Medicine

## 2022-12-31 ENCOUNTER — Other Ambulatory Visit: Payer: Self-pay

## 2022-12-31 DIAGNOSIS — R531 Weakness: Secondary | ICD-10-CM | POA: Diagnosis present

## 2022-12-31 DIAGNOSIS — Z9049 Acquired absence of other specified parts of digestive tract: Secondary | ICD-10-CM | POA: Diagnosis not present

## 2022-12-31 DIAGNOSIS — Z823 Family history of stroke: Secondary | ICD-10-CM | POA: Diagnosis not present

## 2022-12-31 DIAGNOSIS — R9431 Abnormal electrocardiogram [ECG] [EKG]: Secondary | ICD-10-CM | POA: Diagnosis not present

## 2022-12-31 DIAGNOSIS — G9340 Encephalopathy, unspecified: Secondary | ICD-10-CM | POA: Diagnosis not present

## 2022-12-31 DIAGNOSIS — R7881 Bacteremia: Secondary | ICD-10-CM

## 2022-12-31 DIAGNOSIS — B9620 Unspecified Escherichia coli [E. coli] as the cause of diseases classified elsewhere: Secondary | ICD-10-CM | POA: Diagnosis present

## 2022-12-31 DIAGNOSIS — Z87898 Personal history of other specified conditions: Secondary | ICD-10-CM | POA: Diagnosis present

## 2022-12-31 DIAGNOSIS — G9341 Metabolic encephalopathy: Secondary | ICD-10-CM

## 2022-12-31 DIAGNOSIS — N390 Urinary tract infection, site not specified: Secondary | ICD-10-CM

## 2022-12-31 DIAGNOSIS — Z1152 Encounter for screening for COVID-19: Secondary | ICD-10-CM | POA: Diagnosis not present

## 2022-12-31 DIAGNOSIS — I959 Hypotension, unspecified: Secondary | ICD-10-CM | POA: Diagnosis present

## 2022-12-31 DIAGNOSIS — Z91013 Allergy to seafood: Secondary | ICD-10-CM | POA: Diagnosis not present

## 2022-12-31 DIAGNOSIS — Z853 Personal history of malignant neoplasm of breast: Secondary | ICD-10-CM | POA: Diagnosis not present

## 2022-12-31 DIAGNOSIS — Z881 Allergy status to other antibiotic agents status: Secondary | ICD-10-CM | POA: Diagnosis not present

## 2022-12-31 DIAGNOSIS — F03A0 Unspecified dementia, mild, without behavioral disturbance, psychotic disturbance, mood disturbance, and anxiety: Secondary | ICD-10-CM | POA: Diagnosis present

## 2022-12-31 DIAGNOSIS — M810 Age-related osteoporosis without current pathological fracture: Secondary | ICD-10-CM | POA: Diagnosis present

## 2022-12-31 DIAGNOSIS — D630 Anemia in neoplastic disease: Secondary | ICD-10-CM | POA: Diagnosis present

## 2022-12-31 DIAGNOSIS — I10 Essential (primary) hypertension: Secondary | ICD-10-CM

## 2022-12-31 DIAGNOSIS — Z882 Allergy status to sulfonamides status: Secondary | ICD-10-CM | POA: Diagnosis not present

## 2022-12-31 DIAGNOSIS — Z79899 Other long term (current) drug therapy: Secondary | ICD-10-CM | POA: Diagnosis not present

## 2022-12-31 DIAGNOSIS — Z886 Allergy status to analgesic agent status: Secondary | ICD-10-CM | POA: Diagnosis not present

## 2022-12-31 DIAGNOSIS — D649 Anemia, unspecified: Secondary | ICD-10-CM

## 2022-12-31 DIAGNOSIS — E876 Hypokalemia: Secondary | ICD-10-CM | POA: Diagnosis not present

## 2022-12-31 DIAGNOSIS — Z7901 Long term (current) use of anticoagulants: Secondary | ICD-10-CM | POA: Diagnosis not present

## 2022-12-31 DIAGNOSIS — I480 Paroxysmal atrial fibrillation: Secondary | ICD-10-CM | POA: Diagnosis present

## 2022-12-31 DIAGNOSIS — N3001 Acute cystitis with hematuria: Secondary | ICD-10-CM | POA: Diagnosis present

## 2022-12-31 DIAGNOSIS — Z801 Family history of malignant neoplasm of trachea, bronchus and lung: Secondary | ICD-10-CM | POA: Diagnosis not present

## 2022-12-31 DIAGNOSIS — C679 Malignant neoplasm of bladder, unspecified: Secondary | ICD-10-CM | POA: Diagnosis present

## 2022-12-31 DIAGNOSIS — I4891 Unspecified atrial fibrillation: Secondary | ICD-10-CM | POA: Diagnosis not present

## 2022-12-31 DIAGNOSIS — I4819 Other persistent atrial fibrillation: Secondary | ICD-10-CM | POA: Diagnosis not present

## 2022-12-31 HISTORY — DX: Bacteremia: R78.81

## 2022-12-31 HISTORY — DX: Unspecified Escherichia coli (E. coli) as the cause of diseases classified elsewhere: B96.20

## 2022-12-31 LAB — CULTURE, BLOOD (ROUTINE X 2)

## 2022-12-31 LAB — BLOOD CULTURE ID PANEL (REFLEXED) - BCID2

## 2022-12-31 LAB — CBC
HCT: 30 % — ABNORMAL LOW (ref 36.0–46.0)
Hemoglobin: 9.8 g/dL — ABNORMAL LOW (ref 12.0–15.0)
MCH: 27.1 pg (ref 26.0–34.0)
MCHC: 32.7 g/dL (ref 30.0–36.0)
MCV: 82.9 fL (ref 80.0–100.0)
Platelets: 187 10*3/uL (ref 150–400)
RBC: 3.62 MIL/uL — ABNORMAL LOW (ref 3.87–5.11)
RDW: 15.1 % (ref 11.5–15.5)
WBC: 19.5 10*3/uL — ABNORMAL HIGH (ref 4.0–10.5)
nRBC: 0 % (ref 0.0–0.2)

## 2022-12-31 LAB — BASIC METABOLIC PANEL
Anion gap: 7 (ref 5–15)
BUN: 26 mg/dL — ABNORMAL HIGH (ref 8–23)
CO2: 27 mmol/L (ref 22–32)
Calcium: 8.9 mg/dL (ref 8.9–10.3)
Chloride: 98 mmol/L (ref 98–111)
Creatinine, Ser: 1.27 mg/dL — ABNORMAL HIGH (ref 0.44–1.00)
GFR, Estimated: 40 mL/min — ABNORMAL LOW (ref >60)
Glucose, Bld: 125 mg/dL — ABNORMAL HIGH (ref 70–99)
Potassium: 3.5 mmol/L (ref 3.5–5.1)
Sodium: 132 mmol/L — ABNORMAL LOW (ref 135–145)

## 2022-12-31 MED ORDER — SODIUM CHLORIDE 0.9 % IV SOLN: INTRAVENOUS | Status: DC

## 2022-12-31 MED ORDER — METOPROLOL TARTRATE 5 MG/5ML IV SOLN: 5.0000 mg | Freq: Once | INTRAVENOUS | Status: DC

## 2022-12-31 MED ORDER — SODIUM CHLORIDE 0.9 % IV BOLUS
500.0000 mL | Freq: Once | INTRAVENOUS | Status: AC
  Administered 2022-12-31: 500 mL via INTRAVENOUS

## 2022-12-31 MED ORDER — AMIODARONE IV BOLUS ONLY 150 MG/100ML
150.0000 mg | Freq: Once | INTRAVENOUS | Status: AC
  Administered 2022-12-31: 150 mg via INTRAVENOUS
  Filled 2022-12-31 (×3): qty 100

## 2022-12-31 MED ORDER — SODIUM CHLORIDE 0.9 % IV SOLN
2.0000 g | INTRAVENOUS | Status: AC
  Administered 2022-12-31 – 2023-01-02 (×3): 2 g via INTRAVENOUS
  Filled 2022-12-31 (×2): qty 20

## 2022-12-31 MED ORDER — SODIUM CHLORIDE 0.9 % IV SOLN
1.0000 g | INTRAVENOUS | Status: DC
  Filled 2022-12-31: qty 10

## 2022-12-31 NOTE — Consult Note (Signed)
CARDIOLOGY CONSULT NOTE  Patient ID: Gina Mcgee MRN: [id number] DOB/AGE: 87/09/1929 87 y.o.  Admit date: 12/30/2022 Referring Physician  Dr Janee Mornhompson Primary Physician:  Patient, No Pcp Per Reason for Consultation  Afib RVR  Patient ID: Gina BeagleHelen P Ebey, female    DOB: 03/05/1930, 89 y.o.   MRN: [id number]  Chief Complaint  Patient presents with   Weakness   HPI:    Gina BeagleHelen P Demello  is a 89 y.o. female with a PMH significant for but not limited to Afib, HTN, Breast CA, and dementia. She presents today to establish care and cardiac management of afib. Pt was previously seeing [facility] Wyline [facility]anch [facility], but wanted to see a new provider. Of note, patient was diagnosed with afib in January 2023. She has been on Toprol 25 mg daily and Eliquis 2.5 mg BID. CHA2DS-VASc score of 4. She is on HCTZ 12.5 mg daily for HTN and home blood pressures are noted to be around 110/70. She does not have hx of thyroid, liver or lung dx. No hx of sleep apnea. She has no known cardiac disease.  She presented to the ED with altered mental status secondary to UTI. She is feeling better now and like her old self. She is in atrial fibrillation likely due to the infection. Family and patient believe Afib is limiting her quality of life and they would like to try and get her into NSR. Patient denies chest pain, shortness of breath, diaphoresis, syncope.  Past Medical History:  Diagnosis Date   A-fib    Brain concussion 09/22/1958   Cancer    left-sided breast cancer   Concussion 09/22/1958   with left-sided neuro defecits   HTN (hypertension)    Osteoporosis    Past Surgical History:  Procedure Laterality Date   BOWEL RESECTION  approx. in 2007   polyp and formed stricture   BREAST LUMPECTOMY  1981   left lumpectomy w/ radiation   COLONOSCOPY W/ POLYPECTOMY     FEMUR SURGERY  2011   left femur pinning   JOINT REPLACEMENT     ORIF HUMERUS FRACTURE Left 01/25/2013   Procedure: OPEN REDUCTION INTERNAL FIXATION (ORIF)  DISTAL HUMERUS FRACTURE REPAIR RECONSTRUCTION AND ULNA NERVE DECOMPRESSION AND ANTERIOR TRANSPOSITION AS NECESSARY;  Surgeon: Dominica SeverinWilliam Gramig, MD;  Location: [facility] OR;  Service: Orthopedics;  Laterality: Left;   Social History   Tobacco Use   Smoking status: Never   Smokeless tobacco: Never  Substance Use Topics   Alcohol use: Not Currently    Family History  Problem Relation Age of Onset   Stroke Mother    Lung cancer Father    Stroke Father     Marital Status: Widowed  ROS  Review of Systems  Cardiovascular:  Positive for dyspnea on exertion, irregular heartbeat and palpitations.  Genitourinary:  Positive for dysuria, frequency, hesitancy and incomplete emptying.   Objective      12/31/2022    1:31 PM 12/31/2022    9:35 AM 12/31/2022    7:49 AM  Vitals with BMI  Systolic 92 86 88  Diastolic 66 62 60  Pulse   100    Blood pressure 92/66, pulse 100, temperature 98.4 F (36.9 C), temperature source Oral, resp. rate 18, SpO2 92 %.    Physical Exam HENT:     Head: Normocephalic and atraumatic.  Cardiovascular:     Rate and Rhythm: Tachycardia present. Rhythm irregular.     Heart sounds: No murmur heard. Pulmonary:     Effort:  Pulmonary effort is normal.     Breath sounds: Normal breath sounds.  Abdominal:     General: Bowel sounds are normal.  Musculoskeletal:     Right lower leg: No edema.     Left lower leg: No edema.  Skin:    General: Skin is warm and dry.  Neurological:     Mental Status: She is alert.    Laboratory examination:   Recent Labs    08/20/22 0428 12/30/22 1805 12/31/22 0625  NA 130* 134* 132*  K 4.0 3.3* 3.5  CL 96* 98 98  CO2 25 25 27   GLUCOSE 109* 135* 125*  BUN 13 25* 26*  CREATININE 0.83 1.06* 1.27*  CALCIUM 8.9 9.3 8.9  GFRNONAA >60 50* 40*   CrCl cannot be calculated (Unknown ideal weight.).     Latest Ref Rng & Units 12/31/2022    6:25 AM 12/30/2022    6:05 PM 08/20/2022    4:28 AM  CMP  Glucose 70 - 99 mg/dL 141  030  131    BUN 8 - 23 mg/dL 26  25  13    Creatinine 0.44 - 1.00 mg/dL 4.38  8.87  5.79   Sodium 135 - 145 mmol/L 132  134  130   Potassium 3.5 - 5.1 mmol/L 3.5  3.3  4.0   Chloride 98 - 111 mmol/L 98  98  96   CO2 22 - 32 mmol/L 27  25  25    Calcium 8.9 - 10.3 mg/dL 8.9  9.3  8.9   Total Protein 6.5 - 8.1 g/dL  6.7    Total Bilirubin 0.3 - 1.2 mg/dL  0.6    Alkaline Phos 38 - 126 U/L  84    AST 15 - 41 U/L  29    ALT 0 - 44 U/L  17        Latest Ref Rng & Units 12/31/2022    6:25 AM 12/30/2022    6:05 PM 12/10/2022   11:13 AM  CBC  WBC 4.0 - 10.5 K/uL 19.5  19.5    Hemoglobin 12.0 - 15.0 g/dL 9.8  72.8  20.6   Hematocrit 36.0 - 46.0 % 30.0  34.0  36.8   Platelets 150 - 400 K/uL 187  209     Lipid Panel No results for input(s): "CHOL", "TRIG", "LDLCALC", "VLDL", "HDL", "CHOLHDL", "LDLDIRECT" in the last 8760 hours.  HEMOGLOBIN A1C No results found for: "HGBA1C", "MPG" TSH No results for input(s): "TSH" in the last 8760 hours. BNP (last 3 results) Recent Labs    07/24/22 1123  BNP 111.3*   Cardiac Panel (last 3 results) No results for input(s): "CKTOTAL", "CKMB", "TROPONINIHS", "RELINDX" in the last 72 hours.   Medications and allergies   Allergies  Allergen Reactions   Aspirin Other (See Comments)    Gets jitter with large doses   Doxycycline Diarrhea   Fish Allergy Nausea Only and Other (See Comments)    "makes me jumpy and twitchy, but grand children are allergic"    Nsaids     Can take for short periods of time. Naprosyn -feels dazed   Shellfish-Derived Products Other (See Comments)    "makes me jumpy and twitchy, but grand children are allergic"   Aloe Rash   Sulfa Antibiotics Rash     Current Meds  Medication Sig   apixaban (ELIQUIS) 2.5 MG TABS tablet Take 1 tablet (2.5 mg total) by mouth 2 (two) times daily.   Cholecalciferol (VITAMIN D3) 10  MCG (400 UNIT) CAPS Take 400 Units by mouth in the morning, at noon, in the evening, and at bedtime.   hydrochlorothiazide  (HYDRODIURIL) 12.5 MG tablet Take 12.5 mg by mouth daily.   ketoconazole (NIZORAL) 2 % cream Apply 1 Application topically at bedtime. Apply to toes   memantine (NAMENDA) 10 MG tablet Take 10 mg by mouth 2 (two) times daily.   metoprolol succinate (TOPROL-XL) 25 MG 24 hr tablet TAKE 1 TABLET(25 MG) BY MOUTH DAILY (Patient taking differently: Take 25 mg by mouth daily.)   senna-docusate (SENOKOT-S) 8.6-50 MG tablet Take 1 tablet by mouth daily.    Scheduled Meds:  apixaban  2.5 mg Oral BID   memantine  10 mg Oral BID   metoprolol succinate  25 mg Oral Daily   Continuous Infusions:  sodium chloride 100 mL/hr at 12/31/22 1009   amiodarone     cefTRIAXone (ROCEPHIN)  IV 2 g (12/31/22 1010)   PRN Meds:.acetaminophen **OR** acetaminophen, senna-docusate   I/O last 3 completed shifts: In: 600 [IV Piggyback:600] Out: -  Total I/O In: 350 [P.O.:350] Out: -   Net IO Since Admission: 950 mL [12/31/22 1440]   Radiology:   Imaging results have been reviewed and DG Chest Port 1 View  Result Date: 12/30/2022 CLINICAL DATA:  Possible sepsis. EXAM: PORTABLE CHEST 1 VIEW COMPARISON:  08/16/2022. FINDINGS: Cardiac silhouette normal in size. Moderate hiatal hernia, stable. No mediastinal or hilar masses. Clear lungs.  No convincing pleural effusion or pneumothorax. Skeletal structures are diffusely demineralized. IMPRESSION: No acute cardiopulmonary disease. Electronically Signed   By: Amie Portland M.D.   On: 12/30/2022 17:37    Cardiac Studies:   Echocardiogram 08/13/2022:  1. Limited echo for edema   2. Left ventricular ejection fraction, by estimation, is 60 to 65%. Left  ventricular ejection fraction by PLAX is 65 %. The left ventricle has  normal function. The left ventricle has no regional wall motion  abnormalities. Left ventricular diastolic  parameters are consistent with Grade I diastolic dysfunction (impaired  relaxation).   3. The mitral valve is abnormal. Trivial mitral valve  regurgitation.   4. Aortic valve regurgitation is mild.    CT Chest 08/17/2022: 1. No evidence of pulmonary embolism. 2. Scattered airspace disease in the left upper and lower lobes, concerning for infiltrate. Short-term follow-up is recommended to exclude the possibility of underlying nodules. 3. Bilateral apical pleural scarring, greater on the left than on the right. Attention on follow-up is recommended. 4. Small left pleural effusion. 5. Multilevel degenerative changes and compression deformities in the thoracic spine with kyphosis. 6. The heart hiatal hernia. 7. Aortic atherosclerosis and scattered coronary artery calcifications.     EKG    3/20/2022024: Sinus rhythm 82 bpm Occasional PAC   Nonspecific T-abnormality   EKG: 12/31/2022: Atrial fibrillation with rapid ventricular response, rate 103 bpm. Nonspecific T wave abnormality.  Assessment & Recommendations   Afib RVR likely 2/2 e.coli bacteremia (UTI)  Discussed with hospitalist, patient, and family Will give 150 mg amiodarone bolus Holding Toprol due to hypotension Continue Eliquis 2.5 mg BID Will plan for cardioversion on Friday Patient has wedding on Saturday and would really like to attend if possible Antibiotics per primary team Repeat echocardiogram ordered   CHA2DS2-VASc Score = 4   This indicates a 4.8% annual risk of stroke. The patient's score is based upon: CHF History: 0 HTN History: 1 Diabetes History: 0 Stroke History: 0 Vascular Disease History: 0 Age Score: 2 Gender Score: 1  Clotilde Dieter, DO 12/31/2022, 2:40 PM Office: [phone]

## 2022-12-31 NOTE — Progress Notes (Signed)
PHARMACY - PHYSICIAN COMMUNICATION CRITICAL VALUE ALERT - BLOOD CULTURE IDENTIFICATION (BCID)  Assessment:  Gina Mcgee is an 89 y.o. female who presented to [facility] on 12/30/2022 with a chief complaint of AMS. Patient notable has a h/o bladder cancer. She was complaining of abdominal pain but no urinary symptoms. UA with WBC>50, nitrite positive, no contamination. Presumed urinary source of infection.   4/9 Bcx: 3/4 bottles GNRs- E. Coli without resistance mechanisms identified on BCID.  Name of physician (or Provider) Contacted: Ramiro Harvest, MD   Current antibiotics: Ceftriaxone   Changes to prescribed antibiotics recommended:  Patient is on recommended antibiotics - No changes needed  Results for orders placed or performed during the hospital encounter of 12/30/22  Blood Culture ID Panel (Reflexed) (Collected: 12/30/2022  6:20 PM)  Result Value Ref Range   Enterococcus faecalis NOT DETECTED NOT DETECTED   Enterococcus Faecium NOT DETECTED NOT DETECTED   Listeria monocytogenes NOT DETECTED NOT DETECTED   Staphylococcus species NOT DETECTED NOT DETECTED   Staphylococcus aureus (BCID) NOT DETECTED NOT DETECTED   Staphylococcus epidermidis NOT DETECTED NOT DETECTED   Staphylococcus lugdunensis NOT DETECTED NOT DETECTED   Streptococcus species NOT DETECTED NOT DETECTED   Streptococcus agalactiae NOT DETECTED NOT DETECTED   Streptococcus pneumoniae NOT DETECTED NOT DETECTED   Streptococcus pyogenes NOT DETECTED NOT DETECTED   A.calcoaceticus-baumannii NOT DETECTED NOT DETECTED   Bacteroides fragilis NOT DETECTED NOT DETECTED   Enterobacterales DETECTED (A) NOT DETECTED   Enterobacter cloacae complex NOT DETECTED NOT DETECTED   Escherichia coli DETECTED (A) NOT DETECTED   Klebsiella aerogenes NOT DETECTED NOT DETECTED   Klebsiella oxytoca NOT DETECTED NOT DETECTED   Klebsiella pneumoniae NOT DETECTED NOT DETECTED   Proteus species NOT DETECTED NOT DETECTED   Salmonella  species NOT DETECTED NOT DETECTED   Serratia marcescens NOT DETECTED NOT DETECTED   Haemophilus influenzae NOT DETECTED NOT DETECTED   Neisseria meningitidis NOT DETECTED NOT DETECTED   Pseudomonas aeruginosa NOT DETECTED NOT DETECTED   Stenotrophomonas maltophilia NOT DETECTED NOT DETECTED   Candida albicans NOT DETECTED NOT DETECTED   Candida auris NOT DETECTED NOT DETECTED   Candida glabrata NOT DETECTED NOT DETECTED   Candida krusei NOT DETECTED NOT DETECTED   Candida parapsilosis NOT DETECTED NOT DETECTED   Candida tropicalis NOT DETECTED NOT DETECTED   Cryptococcus neoformans/gattii NOT DETECTED NOT DETECTED   CTX-M ESBL NOT DETECTED NOT DETECTED   Carbapenem resistance IMP NOT DETECTED NOT DETECTED   Carbapenem resistance KPC NOT DETECTED NOT DETECTED   Carbapenem resistance NDM NOT DETECTED NOT DETECTED   Carbapenem resist OXA 48 LIKE NOT DETECTED NOT DETECTED   Carbapenem resistance VIM NOT DETECTED NOT DETECTED   Jani Gravel, PharmD PGY-2 Infectious Diseases Resident  12/31/2022 9:48 AM

## 2022-12-31 NOTE — ED Notes (Signed)
ED TO INPATIENT HANDOFF REPORT  ED Nurse Name and Phone #: Raquel Sarna [phone]  S Name/Age/Gender Gina Mcgee 89 y.o. female Room/Bed: 003C/003C  Code Status   Code Status: Full Code  Home/SNF/Other Home Patient oriented to: self, place, and time Is this baseline? Yes   Triage Complete: Triage complete  Chief Complaint Acute encephalopathy [G93.40]  Triage Note Pt bib ems coming from home. Daughter reported that pt has been weak and confused starting around 1100 today. Symptoms similar to when she had rsv in December. 1episode emesis en route 4mg  zofran given by ems.  Reports mild dizziness. Denies nausea, pain, or cough at this time. Pt alert and oriented in triage.   Ems vitals: O2 88% room air; 94% on 2L Hr 92-120 a fib Cbg 103 Bp 119/64   Allergies Allergies  Allergen Reactions   Aspirin Other (See Comments)    Gets jitter with large doses   Doxycycline Diarrhea   Fish Allergy Nausea Only and Other (See Comments)    "makes me jumpy and twitchy, but grand children are allergic"    Nsaids     Can take for short periods of time. Naprosyn -feels dazed   Shellfish-Derived Products Other (See Comments)    "makes me jumpy and twitchy, but grand children are allergic"   Aloe Rash   Sulfa Antibiotics Rash    Level of Care/Admitting Diagnosis ED Disposition     ED Disposition  Admit   Condition  --   Comment  Hospital Area: [facility] [[id number]]  Level of Care: Med-Surg [16]  May place patient in observation at [facility] or [facility] if equivalent level of care is available:: Yes  Covid Evaluation: Asymptomatic - no recent exposure (last 10 days) testing not required  Diagnosis: Acute encephalopathy [[id number]]  Admitting Physician: Katha Cabal [[id number]]  Attending Physician: Katha Cabal [[id number]]          B Medical/Surgery History Past Medical History:  Diagnosis Date   A-fib    Brain concussion 09/22/1958   Cancer     left-sided breast cancer   Concussion 09/22/1958   with left-sided neuro defecits   HTN (hypertension)    Osteoporosis    Past Surgical History:  Procedure Laterality Date   BOWEL RESECTION  approx. in 2007   polyp and formed stricture   BREAST LUMPECTOMY  1981   left lumpectomy w/ radiation   COLONOSCOPY W/ POLYPECTOMY     FEMUR SURGERY  2011   left femur pinning   JOINT REPLACEMENT     ORIF HUMERUS FRACTURE Left 01/25/2013   Procedure: OPEN REDUCTION INTERNAL FIXATION (ORIF) DISTAL HUMERUS FRACTURE REPAIR RECONSTRUCTION AND ULNA NERVE DECOMPRESSION AND ANTERIOR TRANSPOSITION AS NECESSARY;  Surgeon: Dominica Severin, MD;  Location: [facility] OR;  Service: Orthopedics;  Laterality: Left;     A IV Location/Drains/Wounds Patient Lines/Drains/Airways Status     Active Line/Drains/Airways     Name Placement date Placement time Site Days   Peripheral IV 12/30/22 20 G Left Forearm 12/30/22  1646  Forearm  1            Intake/Output Last 24 hours  Intake/Output Summary (Last 24 hours) at 12/31/2022 0203 Last data filed at 12/30/2022 2254 Gross per 24 hour  Intake 600 ml  Output --  Net 600 ml    Labs/Imaging Results for orders placed or performed during the hospital encounter of 12/30/22 (from the past 48 hour(s))  Lactic acid, plasma     Status:  None   Collection Time: 12/30/22  6:05 PM  Result Value Ref Range   Lactic Acid, Venous 1.4 0.5 - 1.9 mmol/L    Comment: Performed at [facility], [address] [place], [place] [address]  Comprehensive metabolic panel     Status: Abnormal   Collection Time: 12/30/22  6:05 PM  Result Value Ref Range   Sodium 134 (L) 135 - 145 mmol/L   Potassium 3.3 (L) 3.5 - 5.1 mmol/L   Chloride 98 98 - 111 mmol/L   CO2 25 22 - 32 mmol/L   Glucose, Bld 135 (H) 70 - 99 mg/dL    Comment: Glucose reference range applies only to samples taken after fasting for at least 8 hours.   BUN 25 (H) 8 - 23 mg/dL   Creatinine, Ser 1.03 (H) 0.44 - 1.00  mg/dL   Calcium 9.3 8.9 - 15.9 mg/dL   Total Protein 6.7 6.5 - 8.1 g/dL   Albumin 3.7 3.5 - 5.0 g/dL   AST 29 15 - 41 U/L   ALT 17 0 - 44 U/L   Alkaline Phosphatase 84 38 - 126 U/L   Total Bilirubin 0.6 0.3 - 1.2 mg/dL   GFR, Estimated 50 (L) >60 mL/min    Comment: (NOTE) Calculated using the CKD-EPI Creatinine Equation (2021)    Anion gap 11 5 - 15    Comment: Performed at [facility], [address] [place], [place] [address]  CBC with Differential     Status: Abnormal   Collection Time: 12/30/22  6:05 PM  Result Value Ref Range   WBC 19.5 (H) 4.0 - 10.5 K/uL   RBC 3.99 3.87 - 5.11 MIL/uL   Hemoglobin 10.7 (L) 12.0 - 15.0 g/dL   HCT 29.2 (L) 44.6 - 28.6 %   MCV 85.2 80.0 - 100.0 fL   MCH 26.8 26.0 - 34.0 pg   MCHC 31.5 30.0 - 36.0 g/dL   RDW 38.1 77.1 - 16.5 %   Platelets 209 150 - 400 K/uL   nRBC 0.0 0.0 - 0.2 %   Neutrophils Relative % 91 %   Neutro Abs 17.7 (H) 1.7 - 7.7 K/uL   Lymphocytes Relative 2 %   Lymphs Abs 0.4 (L) 0.7 - 4.0 K/uL   Monocytes Relative 6 %   Monocytes Absolute 1.2 (H) 0.1 - 1.0 K/uL   Eosinophils Relative 0 %   Eosinophils Absolute 0.0 0.0 - 0.5 K/uL   Basophils Relative 0 %   Basophils Absolute 0.1 0.0 - 0.1 K/uL   Immature Granulocytes 1 %   Abs Immature Granulocytes 0.12 (H) 0.00 - 0.07 K/uL    Comment: Performed at [facility], [address] [place], [place] [address]  Protime-INR     Status: None   Collection Time: 12/30/22  6:05 PM  Result Value Ref Range   Prothrombin Time 14.8 11.4 - 15.2 seconds   INR 1.2 0.8 - 1.2    Comment: (NOTE) INR goal varies based on device and disease states. Performed at [facility], [address] [place], [place] [address]   APTT     Status: None   Collection Time: 12/30/22  6:05 PM  Result Value Ref Range   aPTT 31 24 - 36 seconds    Comment: Performed at [facility], [address] [place], [place] [address]  Resp panel by RT-PCR (RSV, Flu A&B, Covid) Anterior  Nasal Swab     Status:  None   Collection Time: 12/30/22  6:05 PM   Specimen: Anterior Nasal Swab  Result Value Ref Range   SARS Coronavirus 2 by RT PCR NEGATIVE NEGATIVE   Influenza A by PCR NEGATIVE NEGATIVE   Influenza B by PCR NEGATIVE NEGATIVE    Comment: (NOTE) The Xpert Xpress SARS-CoV-2/FLU/RSV plus assay is intended as an aid in the diagnosis of influenza from Nasopharyngeal swab specimens and should not be used as a sole basis for treatment. Nasal washings and aspirates are unacceptable for Xpert Xpress SARS-CoV-2/FLU/RSV testing.  Fact Sheet for Patients: BloggerCourse.com  Fact Sheet for Healthcare Providers: SeriousBroker.it  This test is not yet approved or cleared by the [place] FDA and has been authorized for detection and/or diagnosis of SARS-CoV-2 by FDA under an Emergency Use Authorization (EUA). This EUA will remain in effect (meaning this test can be used) for the duration of the COVID-19 declaration under Section 564(b)(1) of the Act, 21 U.S.C. section 360bbb-3(b)(1), unless the authorization is terminated or revoked.     Resp Syncytial Virus by PCR NEGATIVE NEGATIVE    Comment: (NOTE) Fact Sheet for Patients: BloggerCourse.com  Fact Sheet for Healthcare Providers: SeriousBroker.it  This test is not yet approved or cleared by the [place] FDA and has been authorized for detection and/or diagnosis of SARS-CoV-2 by FDA under an Emergency Use Authorization (EUA). This EUA will remain in effect (meaning this test can be used) for the duration of the COVID-19 declaration under Section 564(b)(1) of the Act, 21 U.S.C. section 360bbb-3(b)(1), unless the authorization is terminated or revoked.  Performed at [facility], [address], [place], [place] [address]   Urinalysis, w/ Reflex to Culture (Infection Suspected) -Urine, Clean Catch      Status: Abnormal   Collection Time: 12/30/22  8:06 PM  Result Value Ref Range   Specimen Source URINE, CLEAN CATCH    Color, Urine YELLOW YELLOW   APPearance CLOUDY (A) CLEAR   Specific Gravity, Urine 1.012 1.005 - 1.030   pH 6.0 5.0 - 8.0   Glucose, UA NEGATIVE NEGATIVE mg/dL   Hgb urine dipstick MODERATE (A) NEGATIVE   Bilirubin Urine NEGATIVE NEGATIVE   Ketones, ur NEGATIVE NEGATIVE mg/dL   Protein, ur 30 (A) NEGATIVE mg/dL   Nitrite POSITIVE (A) NEGATIVE   Leukocytes,Ua LARGE (A) NEGATIVE   RBC / HPF 21-50 0 - 5 RBC/hpf   WBC, UA >50 0 - 5 WBC/hpf    Comment:        Reflex urine culture not performed if WBC <=10, OR if Squamous epithelial cells >5. If Squamous epithelial cells >5 suggest recollection.    Bacteria, UA MANY (A) NONE SEEN   Squamous Epithelial / HPF 0-5 0 - 5 /HPF   WBC Clumps PRESENT    Mucus PRESENT     Comment: Performed at [facility], [address], [place], [place] [address]   DG Chest Port 1 View  Result Date: 12/30/2022 CLINICAL DATA:  Possible sepsis. EXAM: PORTABLE CHEST 1 VIEW COMPARISON:  08/16/2022. FINDINGS: Cardiac silhouette normal in size. Moderate hiatal hernia, stable. No mediastinal or hilar masses. Clear lungs.  No convincing pleural effusion or pneumothorax. Skeletal structures are diffusely demineralized. IMPRESSION: No acute cardiopulmonary disease. Electronically Signed   By: Amie Portland M.D.   On: 12/30/2022 17:37    Pending Labs Unresulted Labs (From admission, onward)     Start     Ordered   12/31/22 0500  Basic metabolic panel  Tomorrow morning,   R        12/30/22 2248   12/31/22 0500  CBC  Tomorrow morning,   R        12/30/22 2248   12/30/22 2006  Urine Culture  Once,   R        12/30/22 2006   12/30/22 1709  Blood Culture (routine x 2)  (Undifferentiated presentation (screening labs and basic nursing orders))  BLOOD CULTURE X 2,   STAT      12/30/22 1710            Vitals/Pain Today's Vitals   12/30/22  2105 12/30/22 2115 12/31/22 0118 12/31/22 0119  BP:  (!) 102/57 (!) 112/44   Pulse:  79 (!) 26 83  Resp:  (!) 29 (!) 28 14  Temp: 99.1 F (37.3 C)     TempSrc: Rectal     SpO2:  100% 91% 93%  PainSc:        Isolation Precautions No active isolations  Medications Medications  cefTRIAXone (ROCEPHIN) 1 g in sodium chloride 0.9 % 100 mL IVPB (has no administration in time range)  metoprolol succinate (TOPROL-XL) 24 hr tablet 25 mg (has no administration in time range)  memantine (NAMENDA) tablet 10 mg (has no administration in time range)  apixaban (ELIQUIS) tablet 2.5 mg (2.5 mg Oral Given 12/30/22 2309)  acetaminophen (TYLENOL) tablet 650 mg (has no administration in time range)    Or  acetaminophen (TYLENOL) suppository 650 mg (has no administration in time range)  senna-docusate (Senokot-S) tablet 1 tablet (has no administration in time range)  lactated ringers bolus 500 mL (0 mLs Intravenous Stopped 12/30/22 2115)  cefTRIAXone (ROCEPHIN) 1 g in sodium chloride 0.9 % 100 mL IVPB (0 g Intravenous Stopped 12/30/22 2254)    Mobility walks with person assist     Focused Assessments Neuro Assessment Handoff:  Swallow screen pass? Yes  Cardiac Rhythm: Atrial fibrillation       Neuro Assessment: Exceptions to [facility] Neuro Checks:      Has TPA been given? No If patient is a Neuro Trauma and patient is going to OR before floor call report to 4N Charge nurse: [phone] or [phone]   R Recommendations: See Admitting Provider Note  Report given to:   Additional Notes: Pt is a/o x 3 and able to ambulate with plus 2 assist

## 2022-12-31 NOTE — Progress Notes (Signed)
PROGRESS NOTE    WHITNEY HILLEGASS  ZOX:[id number] DOB: 1930-05-12 DOA: 12/30/2022 PCP: Patient, No Pcp Per   Chief Complaint  Patient presents with   Weakness    Brief Narrative:  Patient pleasant 89 year old female history of A-fib on Eliquis, hypertension, history of bladder cancer, osteoporosis, breast cancer presenting with acute metabolic encephalopathy/confusion.  Patient noted to have felt weak off and on on the day of admission, denied any dysuria fever hematuria, complains of some abdominal discomfort and some urinary frequency.  Patient noted when EMS got there to be hypoxic with sats in the upper 80s.  Patient seen in the ED noted to have a leukocytosis, urinalysis concerning for UTI, COVID-19 PCR, influenza A and B PCR, RSV negative.  Patient pancultured with blood cultures now turning positive for E. coli.  Urine cultures pending.  Patient on IV Rocephin and dose adjusted to 2 g daily.  Patient also noted to be in A-fib with RVR with low blood pressure, IV amiodarone ordered, cardiology consultation obtained.   Assessment & Plan:   Principal Problem:   Acute metabolic encephalopathy Active Problems:   HTN (hypertension)   Atrial fibrillation   Bladder cancer   Lower urinary tract infectious disease   Leukocytosis   Bacteremia   Anemia   Hypotension   E coli bacteremia   #1 acute metabolic encephalopathy -Likely secondary to infectious etiology of UTI and E. coli bacteremia. -Blood cultures positive for E. Coli .-Urinalysis concerning for UTI. -Urine cultures pending. -Patient improving clinically. -Increase Rocephin to 2 g daily pending culture sensitivities.  2.  A-fib with RVR -Felt likely secondary to acute infectious etiology of UTI and bacteremia. -Patient noted to have soft/low blood pressure and noted to be in A-fib with RVR with heart rates in the 130s and sustained. -CHA2DS2 VASC score  4. -Toprol-XL held due to low blood pressure. -IV amiodarone  ordered, however per floor unable to give IV amiodarone and patient has to be transferred to a progressive floor unit which has been placed. -Continue Eliquis for anticoagulation. -Patient seen in consultation by cardiology who are planning for cardioversion this Friday. -Cardiology has also ordered a repeat 2D echo.  3.  E. coli bacteremia/UTI -Bacteremia likely seeded from urine. -Urine cultures pending. -Sensitivities pending. -Increase IV Rocephin to 2 g daily. -Supportive care.  4.  Hypertension -Blood pressure soft/low, continue to hold HCTZ in the setting of soft/low blood pressure.  5.  Bladder cancer -Patient noted to be due for BCG treatment soon. -Status post TURBT followed by BCG therapy. -Outpatient follow-up with urology.  6.  Hypotension/soft blood pressure -Give IV fluid bolus, place on IV fluids -Patient given IV amiodarone to help with rate control. -Continue empiric IV antibiotics.  7.  Anemia -Likely anemia of chronic disease. -Patient with no overt bleeding. -Check an anemia panel. -Follow H&H. -Transfer threshold hemoglobin < 7.  DVT prophylaxis: Eliquis Code Status: Full Disposition: Transfer to progressive care unit as patient required IV amiodarone which cannot be done on present floor.  Status is: Inpatient. The patient will require care spanning > 2 midnights and should be moved to inpatient because: Severity of illness   Consultants:  Cardiology: Dr. Melton Alar 12/31/2022  Procedures:  Chest x-ray 12/30/2022   Antimicrobials:  IV Rocephin 12/30/2022>>>>   Subjective: Patient laying in bed.  Daughter and caretaker at bedside.  Denies any significant chest pain.  Denies any significant shortness of breath.  No abdominal pain.  States has some urinary leakage.  Denies  any significant dysuria.  Mental status improved since admission.  Per daughter patient has had A-fib ongoing for a while feels patient is going in and out of A-fib causing some  symptomatic symptoms of shortness of breath on minimal exertion at home prior to acute illness.  Patient keeps stating she has to attend her grandsons wedding on Saturday and with the baby like to attend.  Objective: Vitals:   12/31/22 0935 12/31/22 1331 12/31/22 1521 12/31/22 1854  BP: (!) 86/62 92/66 (!) 111/54 (!) 128/96  Pulse:   92 (!) 103  Resp:    20  Temp:   98.4 F (36.9 C) 99.6 F (37.6 C)  TempSrc:   Oral Oral  SpO2:   100% 95%  Weight:    58.6 kg  Height:    5\' 2"  (1.575 m)    Intake/Output Summary (Last 24 hours) at 12/31/2022 1930 Last data filed at 12/31/2022 1700 Gross per 24 hour  Intake 1550.98 ml  Output --  Net 1550.98 ml   Filed Weights   12/31/22 1854  Weight: 58.6 kg    Examination:  General exam: NAD. Respiratory system: Lungs clear to auscultation bilaterally.  No wheezes, no crackles, no rhonchi.  Slight use of accessory muscles of respiration.   Cardiovascular system: Irregularly irregular.  No JVD, no murmurs rubs or gallops.  No lower extremity edema.  Gastrointestinal system: Abdomen is nondistended, soft and nontender. No organomegaly or masses felt. Normal bowel sounds heard. Central nervous system: Alert and oriented. No focal neurological deficits. Extremities: Symmetric 5 x 5 power. Skin: No rashes, lesions or ulcers Psychiatry: Judgement and insight appear normal. Mood & affect appropriate.     Data Reviewed: I have personally reviewed following labs and imaging studies  CBC: Recent Labs  Lab 12/30/22 1805 12/31/22 0625  WBC 19.5* 19.5*  NEUTROABS 17.7*  --   HGB 10.7* 9.8*  HCT 34.0* 30.0*  MCV 85.2 82.9  PLT 209 187    Basic Metabolic Panel: Recent Labs  Lab 12/30/22 1805 12/31/22 0625  NA 134* 132*  K 3.3* 3.5  CL 98 98  CO2 25 27  GLUCOSE 135* 125*  BUN 25* 26*  CREATININE 1.06* 1.27*  CALCIUM 9.3 8.9    GFR: Estimated Creatinine Clearance: 23.3 mL/min (A) (by C-G formula based on SCr of 1.27 mg/dL  (H)).  Liver Function Tests: Recent Labs  Lab 12/30/22 1805  AST 29  ALT 17  ALKPHOS 84  BILITOT 0.6  PROT 6.7  ALBUMIN 3.7    CBG: No results for input(s): "GLUCAP" in the last 168 hours.   Recent Results (from the past 240 hour(s))  Resp panel by RT-PCR (RSV, Flu A&B, Covid) Anterior Nasal Swab     Status: None   Collection Time: 12/30/22  6:05 PM   Specimen: Anterior Nasal Swab  Result Value Ref Range Status   SARS Coronavirus 2 by RT PCR NEGATIVE NEGATIVE Final   Influenza A by PCR NEGATIVE NEGATIVE Final   Influenza B by PCR NEGATIVE NEGATIVE Final    Comment: (NOTE) The Xpert Xpress SARS-CoV-2/FLU/RSV plus assay is intended as an aid in the diagnosis of influenza from Nasopharyngeal swab specimens and should not be used as a sole basis for treatment. Nasal washings and aspirates are unacceptable for Xpert Xpress SARS-CoV-2/FLU/RSV testing.  Fact Sheet for Patients: BloggerCourse.com  Fact Sheet for Healthcare Providers: SeriousBroker.it  This test is not yet approved or cleared by the [place] and has been authorized for  detection and/or diagnosis of SARS-CoV-2 by FDA under an Emergency Use Authorization (EUA). This EUA will remain in effect (meaning this test can be used) for the duration of the COVID-19 declaration under Section 564(b)(1) of the Act, 21 U.S.C. section 360bbb-3(b)(1), unless the authorization is terminated or revoked.     Resp Syncytial Virus by PCR NEGATIVE NEGATIVE Final    Comment: (NOTE) Fact Sheet for Patients: BloggerCourse.com  Fact Sheet for Healthcare Providers: SeriousBroker.it  This test is not yet approved or cleared by the [place] FDA and has been authorized for detection and/or diagnosis of SARS-CoV-2 by FDA under an Emergency Use Authorization (EUA). This EUA will remain in effect (meaning this test can be  used) for the duration of the COVID-19 declaration under Section 564(b)(1) of the Act, 21 U.S.C. section 360bbb-3(b)(1), unless the authorization is terminated or revoked.  Performed at [facility], [address], [place], [place] [address]   Blood Culture (routine x 2)     Status: None (Preliminary result)   Collection Time: 12/30/22  6:20 PM   Specimen: BLOOD  Result Value Ref Range Status   Specimen Description BLOOD RIGHT FOREARM  Final   Special Requests   Final    BOTTLES DRAWN AEROBIC AND ANAEROBIC Blood Culture results may not be optimal due to an excessive volume of blood received in culture bottles   Culture  Setup Time   Final    GRAM NEGATIVE RODS IN BOTH AEROBIC AND ANAEROBIC BOTTLES CRITICAL RESULT CALLED TO, READ BACK BY AND VERIFIED WITH: PHARMD AUSTIN PAYTES [id number] BY JRS. Performed at [facility], [address], [place], [place] [address]    Culture GRAM NEGATIVE RODS  Final   Report Status PENDING  Incomplete  Blood Culture ID Panel (Reflexed)     Status: Abnormal   Collection Time: 12/30/22  6:20 PM  Result Value Ref Range Status   Enterococcus faecalis NOT DETECTED NOT DETECTED Final   Enterococcus Faecium NOT DETECTED NOT DETECTED Final   Listeria monocytogenes NOT DETECTED NOT DETECTED Final   Staphylococcus species NOT DETECTED NOT DETECTED Final   Staphylococcus aureus (BCID) NOT DETECTED NOT DETECTED Final   Staphylococcus epidermidis NOT DETECTED NOT DETECTED Final   Staphylococcus lugdunensis NOT DETECTED NOT DETECTED Final   Streptococcus species NOT DETECTED NOT DETECTED Final   Streptococcus agalactiae NOT DETECTED NOT DETECTED Final   Streptococcus pneumoniae NOT DETECTED NOT DETECTED Final   Streptococcus pyogenes NOT DETECTED NOT DETECTED Final   A.calcoaceticus-baumannii NOT DETECTED NOT DETECTED Final   Bacteroides fragilis NOT DETECTED NOT DETECTED Final   Enterobacterales DETECTED (A) NOT DETECTED Final    Comment:  Enterobacterales represent a large order of gram negative bacteria, not a single organism. CRITICAL RESULT CALLED TO, READ BACK BY AND VERIFIED WITH: PHARMD AUSTIN PAYTES [id number] BY JRS    Enterobacter cloacae complex NOT DETECTED NOT DETECTED Final   Escherichia coli DETECTED (A) NOT DETECTED Final    Comment: CRITICAL RESULT CALLED TO, READ BACK BY AND VERIFIED WITH: PHARMD AUSTIN PAYTES [phone] [id number] BY JRS    Klebsiella aerogenes NOT DETECTED NOT DETECTED Final   Klebsiella oxytoca NOT DETECTED NOT DETECTED Final   Klebsiella pneumoniae NOT DETECTED NOT DETECTED Final   Proteus species NOT DETECTED NOT DETECTED Final   Salmonella species NOT DETECTED NOT DETECTED Final   Serratia marcescens NOT DETECTED NOT DETECTED Final   Haemophilus influenzae NOT DETECTED NOT DETECTED Final   Neisseria meningitidis NOT DETECTED  NOT DETECTED Final   Pseudomonas aeruginosa NOT DETECTED NOT DETECTED Final   Stenotrophomonas maltophilia NOT DETECTED NOT DETECTED Final   Candida albicans NOT DETECTED NOT DETECTED Final   Candida auris NOT DETECTED NOT DETECTED Final   Candida glabrata NOT DETECTED NOT DETECTED Final   Candida krusei NOT DETECTED NOT DETECTED Final   Candida parapsilosis NOT DETECTED NOT DETECTED Final   Candida tropicalis NOT DETECTED NOT DETECTED Final   Cryptococcus neoformans/gattii NOT DETECTED NOT DETECTED Final   CTX-M ESBL NOT DETECTED NOT DETECTED Final   Carbapenem resistance IMP NOT DETECTED NOT DETECTED Final   Carbapenem resistance KPC NOT DETECTED NOT DETECTED Final   Carbapenem resistance NDM NOT DETECTED NOT DETECTED Final   Carbapenem resist OXA 48 LIKE NOT DETECTED NOT DETECTED Final   Carbapenem resistance VIM NOT DETECTED NOT DETECTED Final    Comment: Performed at [facility], [address] [place], [place] [address]  Blood Culture (routine x 2)     Status: None (Preliminary result)   Collection Time: 12/30/22  6:25 PM   Specimen: BLOOD   Result Value Ref Range Status   Specimen Description BLOOD RIGHT ARM  Final   Special Requests   Final    BOTTLES DRAWN AEROBIC AND ANAEROBIC Blood Culture results may not be optimal due to an excessive volume of blood received in culture bottles   Culture  Setup Time   Final    GRAM NEGATIVE RODS IN BOTH AEROBIC AND ANAEROBIC BOTTLES CRITICAL VALUE NOTED.  VALUE IS CONSISTENT WITH PREVIOUSLY REPORTED AND CALLED VALUE. Performed at [facility], [address] [place], [place] [address]    Culture GRAM NEGATIVE RODS  Final   Report Status PENDING  Incomplete         Radiology Studies: DG Chest Port 1 View  Result Date: 12/30/2022 CLINICAL DATA:  Possible sepsis. EXAM: PORTABLE CHEST 1 VIEW COMPARISON:  08/16/2022. FINDINGS: Cardiac silhouette normal in size. Moderate hiatal hernia, stable. No mediastinal or hilar masses. Clear lungs.  No convincing pleural effusion or pneumothorax. Skeletal structures are diffusely demineralized. IMPRESSION: No acute cardiopulmonary disease. Electronically Signed   By: Amie Portlandavid  Ormond M.D.   On: 12/30/2022 17:37        Scheduled Meds:  apixaban  2.5 mg Oral BID   memantine  10 mg Oral BID   metoprolol succinate  25 mg Oral Daily   Continuous Infusions:  sodium chloride 100 mL/hr at 12/31/22 1009   amiodarone     cefTRIAXone (ROCEPHIN)  IV 2 g (12/31/22 1010)     LOS: 0 days    Time spent: 40 minutes    Ramiro Harvestaniel Manar Smalling, MD [facility]   To contact the attending provider between 7A-7P or the covering provider during after hours 7P-7A, please log into the web site [email or web address] and access using universal [place] password for that web site. If you do not have the password, please call the hospital operator.  12/31/2022, 7:30 PM

## 2023-01-01 ENCOUNTER — Encounter (HOSPITAL_COMMUNITY): Payer: Self-pay | Admitting: Anesthesiology

## 2023-01-01 ENCOUNTER — Encounter (HOSPITAL_COMMUNITY): Payer: Self-pay | Admitting: Internal Medicine

## 2023-01-01 ENCOUNTER — Encounter (HOSPITAL_COMMUNITY)
Admission: EM | Disposition: A | Discharge status: Home / Self Care | Payer: Self-pay | Source: Home / Self Care | Attending: Internal Medicine

## 2023-01-01 ENCOUNTER — Inpatient Hospital Stay (HOSPITAL_COMMUNITY): Payer: Medicare Other

## 2023-01-01 DIAGNOSIS — R7881 Bacteremia: Secondary | ICD-10-CM | POA: Diagnosis not present

## 2023-01-01 DIAGNOSIS — G9341 Metabolic encephalopathy: Secondary | ICD-10-CM | POA: Diagnosis not present

## 2023-01-01 DIAGNOSIS — I959 Hypotension, unspecified: Secondary | ICD-10-CM | POA: Diagnosis not present

## 2023-01-01 DIAGNOSIS — R9431 Abnormal electrocardiogram [ECG] [EKG]: Secondary | ICD-10-CM

## 2023-01-01 DIAGNOSIS — I10 Essential (primary) hypertension: Secondary | ICD-10-CM | POA: Diagnosis not present

## 2023-01-01 LAB — CBC WITH DIFFERENTIAL/PLATELET
Abs Immature Granulocytes: 0.05 10*3/uL (ref 0.00–0.07)
Basophils Absolute: 0 10*3/uL (ref 0.0–0.1)
Basophils Relative: 0 %
Eosinophils Absolute: 0 10*3/uL (ref 0.0–0.5)
Eosinophils Relative: 0 %
HCT: 29.9 % — ABNORMAL LOW (ref 36.0–46.0)
Hemoglobin: 9.3 g/dL — ABNORMAL LOW (ref 12.0–15.0)
Immature Granulocytes: 0 %
Lymphocytes Relative: 5 %
Lymphs Abs: 0.6 10*3/uL — ABNORMAL LOW (ref 0.7–4.0)
MCH: 26.4 pg (ref 26.0–34.0)
MCHC: 31.1 g/dL (ref 30.0–36.0)
MCV: 84.9 fL (ref 80.0–100.0)
Monocytes Absolute: 0.7 10*3/uL (ref 0.1–1.0)
Monocytes Relative: 5 %
Neutro Abs: 11.3 10*3/uL — ABNORMAL HIGH (ref 1.7–7.7)
Neutrophils Relative %: 90 %
Platelets: 159 10*3/uL (ref 150–400)
RBC: 3.52 MIL/uL — ABNORMAL LOW (ref 3.87–5.11)
RDW: 15.3 % (ref 11.5–15.5)
WBC: 12.7 10*3/uL — ABNORMAL HIGH (ref 4.0–10.5)
nRBC: 0 % (ref 0.0–0.2)

## 2023-01-01 LAB — ECHOCARDIOGRAM COMPLETE
AR max vel: 1.92 cm2
AV Area VTI: 1.92 cm2
AV Area mean vel: 1.81 cm2
AV Mean grad: 5 mmHg
AV Peak grad: 9.4 mmHg
Ao pk vel: 1.53 m/s
Area-P 1/2: 5.02 cm2
Height: 62 in
P 1/2 time: 292 msec
S' Lateral: 2.3 cm
Weight: 2067.03 oz

## 2023-01-01 LAB — BASIC METABOLIC PANEL
Anion gap: 10 (ref 5–15)
BUN: 35 mg/dL — ABNORMAL HIGH (ref 8–23)
CO2: 25 mmol/L (ref 22–32)
Calcium: 8.6 mg/dL — ABNORMAL LOW (ref 8.9–10.3)
Chloride: 97 mmol/L — ABNORMAL LOW (ref 98–111)
Creatinine, Ser: 1.27 mg/dL — ABNORMAL HIGH (ref 0.44–1.00)
GFR, Estimated: 40 mL/min — ABNORMAL LOW (ref >60)
Glucose, Bld: 117 mg/dL — ABNORMAL HIGH (ref 70–99)
Potassium: 3.2 mmol/L — ABNORMAL LOW (ref 3.5–5.1)
Sodium: 132 mmol/L — ABNORMAL LOW (ref 135–145)

## 2023-01-01 LAB — CULTURE, BLOOD (ROUTINE X 2)

## 2023-01-01 LAB — MAGNESIUM: Magnesium: 2 mg/dL (ref 1.7–2.4)

## 2023-01-01 SURGERY — CARDIOVERSION
Anesthesia: General

## 2023-01-01 MED ORDER — POTASSIUM CHLORIDE CRYS ER 10 MEQ PO TBCR
40.0000 meq | EXTENDED_RELEASE_TABLET | ORAL | Status: AC
  Administered 2023-01-01 (×2): 40 meq via ORAL
  Filled 2023-01-01 (×2): qty 4

## 2023-01-01 MED ORDER — DILTIAZEM HCL ER COATED BEADS 120 MG PO CP24
120.0000 mg | ORAL_CAPSULE | Freq: Every day | ORAL | Status: DC
  Administered 2023-01-02: 120 mg via ORAL
  Filled 2023-01-01 (×2): qty 1

## 2023-01-01 NOTE — Evaluation (Signed)
Physical Therapy Evaluation Patient Details Name: Gina Mcgee MRN: [id number] DOB: Apr 01, 1930 Today's Date: 01/01/2023  History of Present Illness  89 y.o. female presented with SOB and per caregiver patient was delirious. Medical history significant of A-fib (Eliquis), hypertension, bladder cancer, osteoporosis, breast cancer.  Clinical Impression  Pt presents with admitting diagnosis above. Pt caregiver present and able to assist with history taking. Per pt and caregiver, at baseline pt has 24 hour assistance at home and requires assistance for all mobility and ADLs. Pt is typically at [facility] level in the community and only ambulates "across the room" with rollator at home. 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 Today pt was Min A for bed mobility and Min/Mod A for gait with platform rollator however pt was very unsteady and fatigued very quickly. Pt demonstrated short burst of quick shuffling gait and required Mod A for balance when fatigued. Trialed gait with platform rollator, RW, and regular rollator however pt was unsteady with all DME devices. Caregiver reports that at home pt uses rollator and "just drapes her L arm over the L hand grip". Pt may benefit from a platform RW next session for gait. Despite this, pt appears to be near her baseline and has 24 hour assist at home so pt will likely not need any follow up PT. Pt is scheduled for cardioversion today at 2:30. PT will see for 1 additional visit to trial platform RW and assess mobility status post procedure then likely sign off.      Recommendations for follow up therapy are one component of a multi-disciplinary discharge planning process, led by the attending physician.  Recommendations may be updated based on patient status, additional functional criteria and insurance authorization.  Follow Up  Recommendations       Assistance Recommended at Discharge Frequent or constant Supervision/Assistance  Patient can return home with the following  A lot of help with walking and/or transfers;A lot of help with bathing/dressing/bathroom;Assistance with cooking/housework;Direct supervision/assist for medications management;Direct supervision/assist for financial management;Assist for transportation;Help with stairs or ramp for entrance    Equipment Recommendations Other (comment) (Pt may benefit from a platform petite rollator)  Recommendations for Other Services       Functional Status Assessment Patient has had a recent decline in their functional status and demonstrates the ability to make significant improvements in function in a reasonable and predictable amount of time.     Precautions / Restrictions Precautions Precautions: Fall Restrictions Weight Bearing Restrictions: No Other Position/Activity Restrictions: L wrist impaitement PTA, no intrinsic hand muscle strength      Mobility  Bed Mobility Overal bed mobility: Needs Assistance Bed Mobility: Supine to Sit     Supine to sit: Min assist     General bed mobility comments: Assist with scooting hips    Transfers Overall transfer level: Needs assistance Equipment used: Rollator (4 wheels) Location manager) Transfers: Sit to/from Stand Sit to Stand: Min guard, Min assist           General transfer comment: Pt able to perform multiple sit to stands. Initially Min G however once fatigued required Min A    Ambulation/Gait Ambulation/Gait assistance: Min assist, Mod assist Gait Distance (Feet): 10 Feet Assistive device: Rollator (4 wheels) (Platform) Gait Pattern/deviations: Shuffle, Staggering left, Staggering right, Trunk flexed, Decreased stride length, Step-through pattern Gait  velocity: short burst of fast shuffling gait     General Gait Details: Very fast unsteady shuffling gait with platform rollator. Pt unable  to use L hand and PCA states that at home she uses regular rollator but just props up her L arm. Pt fatigues very quickly and required Mod A for balance.  Stairs            Wheelchair Mobility    Modified Rankin (Stroke Patients Only)       Balance Overall balance assessment: Needs assistance Sitting-balance support: Bilateral upper extremity supported, Feet supported Sitting balance-Leahy Scale: Fair Sitting balance - Comments: Able to sit EOB for RN to take pure wick out   Standing balance support: Reliant on assistive device for balance, Bilateral upper extremity supported, During functional activity Standing balance-Leahy Scale: Poor Standing balance comment: Mod A for balance                             Pertinent Vitals/Pain Pain Assessment Pain Assessment: No/denies pain    Home Living Family/patient expects to be discharged to:: Private residence Living Arrangements: Children (Daughter Gina Mcgee) Available Help at Discharge: Family;Personal care attendant;Available 24 hours/day Type of Home: House Home Access: Level entry       Home Layout: One level Home Equipment: Rollator (4 wheels);Cane - single point;BSC/3in1;Shower seat;Grab bars - toilet;Grab bars - tub/shower;Hand held shower head;Wheelchair - manual Additional Comments: Has 24 hr assistance between [employer], personal care aide, & daughter.    Prior Function Prior Level of Function : Needs assist             Mobility Comments: Pt was able to ambulate short distances ("across the room") prior to admission with rollator. ADLs Comments: Assistance with dressing and iADLs from PCAs and daughters. PCA states that she can bath herself but needs assistance with getting in and out of shower and drying off.     Hand Dominance   Dominant Hand: Right    Extremity/Trunk Assessment   Upper Extremity Assessment Upper Extremity Assessment: LUE deficits/detail;RUE deficits/detail RUE  Deficits / Details: WFL LUE Deficits / Details: Pt unable to use L hand due to prior nerve injury from a previous orthopedic surgery.    Lower Extremity Assessment Lower Extremity Assessment: LLE deficits/detail LLE Deficits / Details: [name] state that pt has L sided deficits from previous TBI pt sustained in her 4020s.    Cervical / Trunk Assessment Cervical / Trunk Assessment: Kyphotic  Communication   Communication: HOH  Cognition Arousal/Alertness: Awake/alert Behavior During Therapy: WFL for tasks assessed/performed Overall Cognitive Status: Impaired/Different from baseline                                 General Comments: Pt perseverating on her daughter being a Engineer, civil (consulting)nurse and her grandson's wedding.        General Comments General comments (skin integrity, edema, etc.): VSS on RA, HR in 90s with functional ambulation and Sp02 93% on RA during activity    Exercises     Assessment/Plan    PT Assessment Patient needs continued PT services  PT Problem List Decreased strength;Decreased range of motion;Decreased activity tolerance;Decreased balance;Decreased mobility;Decreased coordination;Decreased cognition;Decreased knowledge of use of DME;Decreased safety awareness;Cardiopulmonary status limiting activity       PT Treatment Interventions DME instruction;Gait training;Stair training;Functional mobility training;Therapeutic activities;Therapeutic exercise;Balance training;Neuromuscular re-education;Patient/family education    PT  Goals (Current goals can be found in the Care Plan section)  Acute Rehab PT Goals Patient Stated Goal: To go to her grandsons wedding PT Goal Formulation: With patient/family Time For Goal Achievement: 01/15/23 Potential to Achieve Goals: Fair    Frequency Min 1X/week     Co-evaluation               AM-PAC PT "6 Clicks" Mobility  Outcome Measure Help needed turning from your back to your side while in a flat bed without  using bedrails?: A Little Help needed moving from lying on your back to sitting on the side of a flat bed without using bedrails?: A Little Help needed moving to and from a bed to a chair (including a wheelchair)?: A Lot Help needed standing up from a chair using your arms (e.g., wheelchair or bedside chair)?: A Little Help needed to walk in hospital room?: A Lot Help needed climbing 3-5 steps with a railing? : Total 6 Click Score: 14    End of Session Equipment Utilized During Treatment: Gait belt Activity Tolerance: Patient limited by fatigue Patient left: Other (comment);with family/visitor present (Handoff to OT with pt seated in rollator. Caregiver present) Nurse Communication: Mobility status;Other (comment) (O2 sats) PT Visit Diagnosis: Other abnormalities of gait and mobility (R26.89)    Time: 9244-6286 PT Time Calculation (min) (ACUTE ONLY): 46 min   Charges:   PT Evaluation $PT Eval Moderate Complexity: 1 Mod PT Treatments $Gait Training: 8-22 mins $Therapeutic Activity: 23-37 mins        Shela Nevin, PT, DPT Acute Rehab Services [phone]   [name] 01/01/2023, 1:03 PM

## 2023-01-01 NOTE — Progress Notes (Signed)
Subjective:  Feels okay  Still in A-fib with RVR   Current Facility-Administered Medications:    0.9 %  sodium chloride infusion, , Intravenous, Continuous, Rodolph Bong, MD, Last Rate: 100 mL/hr at 12/31/22 1009, New Bag at 12/31/22 1009   acetaminophen (TYLENOL) tablet 650 mg, 650 mg, Oral, Q6H PRN **OR** acetaminophen (TYLENOL) suppository 650 mg, 650 mg, Rectal, Q6H PRN, Brimage, Vondra, DO   apixaban (ELIQUIS) tablet 2.5 mg, 2.5 mg, Oral, BID, Brimage, Vondra, DO, 2.5 mg at 12/31/22 2243   cefTRIAXone (ROCEPHIN) 2 g in sodium chloride 0.9 % 100 mL IVPB, 2 g, Intravenous, Q24H, Paytes, Austin A, RPH, Last Rate: 200 mL/hr at 12/31/22 1010, 2 g at 12/31/22 1010   memantine (NAMENDA) tablet 10 mg, 10 mg, Oral, BID, Brimage, Vondra, DO, 10 mg at 12/31/22 2243   metoprolol succinate (TOPROL-XL) 24 hr tablet 25 mg, 25 mg, Oral, Daily, Brimage, Vondra, DO   senna-docusate (Senokot-S) tablet 1 tablet, 1 tablet, Oral, QHS PRN, Brimage, Vondra, DO   Objective:  Vital Signs in the last 24 hours: Temp:  [98 F (36.7 C)-99.6 F (37.6 C)] 98.5 F (36.9 C) (04/11 0339) Pulse Rate:  [85-103] 85 (04/11 0700) Resp:  [18-22] 18 (04/11 0700) BP: (86-138)/(52-96) 123/77 (04/11 0339) SpO2:  [92 %-100 %] 92 % (04/11 0700) Weight:  [58.6 kg] 58.6 kg (04/11 0037)  Intake/Output from previous day: 04/10 0701 - 04/11 0700 In: 1191 [P.O.:590; I.V.:502.7; IV Piggyback:98.3] Out: -   Physical Exam Vitals and nursing note reviewed.  Constitutional:      General: She is not in acute distress. Neck:     Vascular: No JVD.  Cardiovascular:     Rate and Rhythm: Tachycardia present. Rhythm irregular.     Heart sounds: Normal heart sounds. No murmur heard. Pulmonary:     Effort: Pulmonary effort is normal.     Breath sounds: Normal breath sounds. No wheezing or rales.      Imaging/tests reviewed and independently interpreted: CXR 12/30/2022: No acute cardiopulmonary disease.   Cardiac  Studies:  Telemetry 01/01/2023: A-fib with RVR  EKG 12/31/2022: Atrial fibrillation with rapid ventricular response Nonspecific T wave abnormality Abnormal ECG  Echocardiogram 08/13/2022:  1. Limited echo for edema   2. Left ventricular ejection fraction, by estimation, is 60 to 65%. Left  ventricular ejection fraction by PLAX is 65 %. The left ventricle has  normal function. The left ventricle has no regional wall motion  abnormalities. Left ventricular diastolic  parameters are consistent with Grade I diastolic dysfunction (impaired  relaxation).   3. The mitral valve is abnormal. Trivial mitral valve regurgitation.   4. Aortic valve regurgitation is mild.   Comparison(s): Changes from prior study are noted. 11/07/2021: LVEF 60-65%,  mild RV systolic dysfunction, mild AI and MR.    Assessment & Recommendations:  89 y/o Caucasian female with hypertension, PAF, mild dementia, h/o breast cancer, admitted for UTI, dyselectrolytemia EMEA, and A-fib with RVR  A-fib with RVR: Remains in A-fib with RVR on metoprolol succinate 25 mg daily, and amiodarone. She wants to attend her grandson's wedding in [place], [place] on Saturday. Quickest way to restore sinus rhythm would be cardioversion. Keep n.p.o. for now.  Plan for cardioversion this afternoon. Continue Eliquis 2.5 mg twice daily.  On a separate note, recommend cortication for home oxygen, as requested by family.  UTI, dyselectrolytemia: As per primary team   Discussed interpretation of tests and management recommendations with the primary team   Elder Negus, MD  Pager: [phone] Office: [phone]

## 2023-01-01 NOTE — Anesthesia Preprocedure Evaluation (Deleted)
Anesthesia Evaluation    Reviewed: Allergy & Precautions, Patient's Chart, lab work & pertinent test results, Unable to perform ROS - Chart review only  Airway        Dental   Pulmonary pneumonia          Cardiovascular hypertension, + Valvular Problems/Murmurs AI and MR   Echo 07/2022  1. Limited echo for edema   2. Left ventricular ejection fraction, by estimation, is 60 to 65%. Left ventricular ejection fraction by PLAX is 65 %. The left ventricle has normal function. The left ventricle has no regional wall motion abnormalities. Left ventricular diastolic parameters are consistent with Grade I diastolic dysfunction (impaired relaxation).   3. The mitral valve is abnormal. Trivial mitral valve regurgitation.   4. Aortic valve regurgitation is mild.   Comparison(s): Changes from prior study are noted. 11/07/2021: LVEF 60-65%, mild RV systolic dysfunction, mild AI and MR.     Neuro/Psych negative neurological ROS     GI/Hepatic Neg liver ROS,GERD  ,,  Endo/Other  negative endocrine ROS    Renal/GU negative Renal ROS     Musculoskeletal negative musculoskeletal ROS (+)    Abdominal   Peds  Hematology  (+) Blood dyscrasia, anemia   Anesthesia Other Findings   Reproductive/Obstetrics                             Anesthesia Physical Anesthesia Plan  ASA: 3  Anesthesia Plan: General   Post-op Pain Management:    Induction: Intravenous  PONV Risk Score and Plan:   Airway Management Planned:   Additional Equipment:   Intra-op Plan:   Post-operative Plan: Extubation in OR  Informed Consent: I have reviewed the patients History and Physical, chart, labs and discussed the procedure including the risks, benefits and alternatives for the proposed anesthesia with the patient or authorized representative who has indicated his/her understanding and acceptance.     Dental advisory  given  Plan Discussed with: CRNA  Anesthesia Plan Comments:         Anesthesia Quick Evaluation

## 2023-01-01 NOTE — Plan of Care (Signed)
  Problem: Activity: Goal: Risk for activity intolerance will decrease Outcome: Progressing   Problem: Safety: Goal: Ability to remain free from injury will improve Outcome: Progressing   

## 2023-01-01 NOTE — Progress Notes (Addendum)
PROGRESS NOTE    Gina Mcgee  ZOX:[id number] DOB: 09-14-1930 DOA: 12/30/2022 PCP: [name], MD   Chief Complaint  Patient presents with   Weakness    Brief Narrative:  Patient pleasant 89 year old female history of A-fib on Eliquis, hypertension, history of bladder cancer, osteoporosis, breast cancer presenting with acute metabolic encephalopathy/confusion.  Patient noted to have felt weak off and on on the day of admission, denied any dysuria fever hematuria, complains of some abdominal discomfort and some urinary frequency.  Patient noted when EMS got there to be hypoxic with sats in the upper 80s.  Patient seen in the ED noted to have a leukocytosis, urinalysis concerning for UTI, COVID-19 PCR, influenza A and B PCR, RSV negative.  Patient pancultured with blood cultures now turning positive for E. coli.  Urine cultures pending.  Patient on IV Rocephin and dose adjusted to 2 g daily.  Patient also noted to be in A-fib with RVR with low blood pressure, IV amiodarone ordered, cardiology consultation obtained.   Assessment & Plan:   Principal Problem:   Acute metabolic encephalopathy Active Problems:   HTN (hypertension)   Atrial fibrillation   Bladder cancer   Lower urinary tract infectious disease   Leukocytosis   Bacteremia   Anemia   Hypotension   E coli bacteremia   #1 acute metabolic encephalopathy -Likely secondary to infectious etiology of UTI and E. coli bacteremia. -Blood cultures positive for E. Coli .-Urinalysis concerning for UTI with preliminary urine cultures > 100,000 colonies of GNR. -Patient improvement likely. -Continue IV Rocephin.  2.  A-fib with RVR -Felt likely secondary to acute infectious etiology of UTI and bacteremia. -Patient noted to have soft/low blood pressure and noted to be in A-fib with RVR with heart rates in the 130s and sustained on 12/31/2022.. -CHA2DS2 VASC score  4. -Toprol-XL held due to low blood pressure. -IV amiodarone  ordered, however per floor unable to give IV amiodarone and patient has to be transferred to a progressive floor unit and received a dose of IV amiodarone 12/31/2022.  -Heart rate has improved however currently still in A-fib.  -Continue Eliquis for anticoagulation. -Patient seen in consultation by cardiology who have ordered a 2D echo which is currently pending.   -Patient for cardioversion this afternoon per cardiology. -Appreciate cardiology input and recommendations.  3.  E. coli bacteremia/UTI -Bacteremia likely seeded from urine. -Preliminary urine cultures with > 100,000 colonies of gram-negative rods.  -Sensitivities pending. -4/4 blood cultures positive for E. coli.   -Continue IV Rocephin.   -Supportive care.   4.  Hypertension -BP was soft/low early on in the hospitalization however has improved with IV fluids, empiric IV antibiotics.   -Continue to hold HCTZ likely will not resume on discharge.    5.  Bladder cancer -Patient noted to be due for BCG treatment soon. -Status post TURBT followed by BCG therapy. -Outpatient follow-up with urology.  6.  Hypotension/soft blood pressure -Blood pressure IV fluids.  Fluid resuscitation.   -Status post 1 dose IV amiodarone heart rate controlled however still in A-fib.   -Continue empiric IV antibiotics.   7.  Anemia -Likely anemia of chronic disease. -Patient with no overt bleeding. -Check an anemia panel. -Hemoglobin stable at 9.3.   -Follow H&H. -Transfer threshold hemoglobin < 7.  8.  Hypokalemia -Replete.  DVT prophylaxis: Eliquis Code Status: Full Disposition: Home once cultures are finalized, cleared by cardiology.   Status is: Inpatient. The patient will require care spanning > 2 midnights  and should be moved to inpatient because: Severity of illness   Consultants:  Cardiology: Dr. Melton Alar 12/31/2022  Procedures:  Chest x-ray 12/30/2022 2D echo pending  Antimicrobials:  IV Rocephin  12/30/2022>>>>   Subjective: Patient sleeping but easily arousable.  Caretaker at bedside.  Denies any chest pain, no shortness of breath, no abdominal pain.  States is supposed to have a cardioversion today and is hopeful to go home tomorrow as she has to go to her grandsons wedding on Saturday.    Objective: Vitals:   01/01/23 0037 01/01/23 0339 01/01/23 0700 01/01/23 0800  BP: 138/69 123/77  (!) 139/97  Pulse: 91 97 85 (!) 104  Resp: (!) 22 (!) 22 18 20   Temp: 98 F (36.7 C) 98.5 F (36.9 C)    TempSrc: Oral Oral    SpO2: 99% 93% 92% 94%  Weight: 58.6 kg     Height:        Intake/Output Summary (Last 24 hours) at 01/01/2023 1051 Last data filed at 12/31/2022 2230 Gross per 24 hour  Intake 840.98 ml  Output --  Net 840.98 ml    Filed Weights   12/31/22 1854 01/01/23 0037  Weight: 58.6 kg 58.6 kg    Examination:  General exam: NAD. Respiratory system: CTAB.  No wheezes, no crackles, no rhonchi.  Fair air movement.  Speaking in full sentences.  Cardiovascular system: Irregularly irregular.  No JVD, no murmurs rubs or gallops.  No lower extremity edema.   Gastrointestinal system: Abdomen is soft, nontender, nondistended, positive bowel sounds.  No rebound.  No guarding.   Central nervous system: Alert and oriented. No focal neurological deficits. Extremities: Symmetric 5 x 5 power. Skin: No rashes, lesions or ulcers Psychiatry: Judgement and insight appear normal. Mood & affect appropriate.     Data Reviewed: I have personally reviewed following labs and imaging studies  CBC: Recent Labs  Lab 12/30/22 1805 12/31/22 0625 01/01/23 0050  WBC 19.5* 19.5* 12.7*  NEUTROABS 17.7*  --  11.3*  HGB 10.7* 9.8* 9.3*  HCT 34.0* 30.0* 29.9*  MCV 85.2 82.9 84.9  PLT 209 187 159     Basic Metabolic Panel: Recent Labs  Lab 12/30/22 1805 12/31/22 0625 01/01/23 0050  NA 134* 132* 132*  K 3.3* 3.5 3.2*  CL 98 98 97*  CO2 25 27 25   GLUCOSE 135* 125* 117*  BUN 25* 26*  35*  CREATININE 1.06* 1.27* 1.27*  CALCIUM 9.3 8.9 8.6*  MG  --   --  2.0     GFR: Estimated Creatinine Clearance: 23.3 mL/min (A) (by C-G formula based on SCr of 1.27 mg/dL (H)).  Liver Function Tests: Recent Labs  Lab 12/30/22 1805  AST 29  ALT 17  ALKPHOS 84  BILITOT 0.6  PROT 6.7  ALBUMIN 3.7     CBG: No results for input(s): "GLUCAP" in the last 168 hours.   Recent Results (from the past 240 hour(s))  Resp panel by RT-PCR (RSV, Flu A&B, Covid) Anterior Nasal Swab     Status: None   Collection Time: 12/30/22  6:05 PM   Specimen: Anterior Nasal Swab  Result Value Ref Range Status   SARS Coronavirus 2 by RT PCR NEGATIVE NEGATIVE Final   Influenza A by PCR NEGATIVE NEGATIVE Final   Influenza B by PCR NEGATIVE NEGATIVE Final    Comment: (NOTE) The Xpert Xpress SARS-CoV-2/FLU/RSV plus assay is intended as an aid in the diagnosis of influenza from Nasopharyngeal swab specimens and should not be  used as a sole basis for treatment. Nasal washings and aspirates are unacceptable for Xpert Xpress SARS-CoV-2/FLU/RSV testing.  Fact Sheet for Patients: BloggerCourse.com  Fact Sheet for Healthcare Providers: SeriousBroker.it  This test is not yet approved or cleared by the [place] FDA and has been authorized for detection and/or diagnosis of SARS-CoV-2 by FDA under an Emergency Use Authorization (EUA). This EUA will remain in effect (meaning this test can be used) for the duration of the COVID-19 declaration under Section 564(b)(1) of the Act, 21 U.S.C. section 360bbb-3(b)(1), unless the authorization is terminated or revoked.     Resp Syncytial Virus by PCR NEGATIVE NEGATIVE Final    Comment: (NOTE) Fact Sheet for Patients: BloggerCourse.com  Fact Sheet for Healthcare Providers: SeriousBroker.it  This test is not yet approved or cleared by the [place]  FDA and has been authorized for detection and/or diagnosis of SARS-CoV-2 by FDA under an Emergency Use Authorization (EUA). This EUA will remain in effect (meaning this test can be used) for the duration of the COVID-19 declaration under Section 564(b)(1) of the Act, 21 U.S.C. section 360bbb-3(b)(1), unless the authorization is terminated or revoked.  Performed at [facility], [address], [place], [place] [address]   Blood Culture (routine x 2)     Status: Abnormal (Preliminary result)   Collection Time: 12/30/22  6:20 PM   Specimen: BLOOD  Result Value Ref Range Status   Specimen Description BLOOD RIGHT FOREARM  Final   Special Requests   Final    BOTTLES DRAWN AEROBIC AND ANAEROBIC Blood Culture results may not be optimal due to an excessive volume of blood received in culture bottles   Culture  Setup Time   Final    GRAM NEGATIVE RODS IN BOTH AEROBIC AND ANAEROBIC BOTTLES CRITICAL RESULT CALLED TO, READ BACK BY AND VERIFIED WITH: PHARMD AUSTIN PAYTES [id number] BY JRS.    Culture (A)  Final    ESCHERICHIA COLI SUSCEPTIBILITIES TO FOLLOW Performed at [facility], [address] [place], [place] [address]    Report Status PENDING  Incomplete  Blood Culture ID Panel (Reflexed)     Status: Abnormal   Collection Time: 12/30/22  6:20 PM  Result Value Ref Range Status   Enterococcus faecalis NOT DETECTED NOT DETECTED Final   Enterococcus Faecium NOT DETECTED NOT DETECTED Final   Listeria monocytogenes NOT DETECTED NOT DETECTED Final   Staphylococcus species NOT DETECTED NOT DETECTED Final   Staphylococcus aureus (BCID) NOT DETECTED NOT DETECTED Final   Staphylococcus epidermidis NOT DETECTED NOT DETECTED Final   Staphylococcus lugdunensis NOT DETECTED NOT DETECTED Final   Streptococcus species NOT DETECTED NOT DETECTED Final   Streptococcus agalactiae NOT DETECTED NOT DETECTED Final   Streptococcus pneumoniae NOT DETECTED NOT DETECTED Final   Streptococcus  pyogenes NOT DETECTED NOT DETECTED Final   A.calcoaceticus-baumannii NOT DETECTED NOT DETECTED Final   Bacteroides fragilis NOT DETECTED NOT DETECTED Final   Enterobacterales DETECTED (A) NOT DETECTED Final    Comment: Enterobacterales represent a large order of gram negative bacteria, not a single organism. CRITICAL RESULT CALLED TO, READ BACK BY AND VERIFIED WITH: PHARMD AUSTIN PAYTES [id number] BY JRS    Enterobacter cloacae complex NOT DETECTED NOT DETECTED Final   Escherichia coli DETECTED (A) NOT DETECTED Final    Comment: CRITICAL RESULT CALLED TO, READ BACK BY AND VERIFIED WITH: PHARMD AUSTIN PAYTES [id number] 0933 BY JRS    Klebsiella aerogenes NOT DETECTED NOT DETECTED Final   Klebsiella  oxytoca NOT DETECTED NOT DETECTED Final   Klebsiella pneumoniae NOT DETECTED NOT DETECTED Final   Proteus species NOT DETECTED NOT DETECTED Final   Salmonella species NOT DETECTED NOT DETECTED Final   Serratia marcescens NOT DETECTED NOT DETECTED Final   Haemophilus influenzae NOT DETECTED NOT DETECTED Final   Neisseria meningitidis NOT DETECTED NOT DETECTED Final   Pseudomonas aeruginosa NOT DETECTED NOT DETECTED Final   Stenotrophomonas maltophilia NOT DETECTED NOT DETECTED Final   Candida albicans NOT DETECTED NOT DETECTED Final   Candida auris NOT DETECTED NOT DETECTED Final   Candida glabrata NOT DETECTED NOT DETECTED Final   Candida krusei NOT DETECTED NOT DETECTED Final   Candida parapsilosis NOT DETECTED NOT DETECTED Final   Candida tropicalis NOT DETECTED NOT DETECTED Final   Cryptococcus neoformans/gattii NOT DETECTED NOT DETECTED Final   CTX-M ESBL NOT DETECTED NOT DETECTED Final   Carbapenem resistance IMP NOT DETECTED NOT DETECTED Final   Carbapenem resistance KPC NOT DETECTED NOT DETECTED Final   Carbapenem resistance NDM NOT DETECTED NOT DETECTED Final   Carbapenem resist OXA 48 LIKE NOT DETECTED NOT DETECTED Final   Carbapenem resistance VIM NOT DETECTED NOT DETECTED  Final    Comment: Performed at [facility], [address], [place], [place] [address]  Blood Culture (routine x 2)     Status: Abnormal (Preliminary result)   Collection Time: 12/30/22  6:25 PM   Specimen: BLOOD  Result Value Ref Range Status   Specimen Description BLOOD RIGHT ARM  Final   Special Requests   Final    BOTTLES DRAWN AEROBIC AND ANAEROBIC Blood Culture results may not be optimal due to an excessive volume of blood received in culture bottles   Culture  Setup Time   Final    GRAM NEGATIVE RODS IN BOTH AEROBIC AND ANAEROBIC BOTTLES CRITICAL VALUE NOTED.  VALUE IS CONSISTENT WITH PREVIOUSLY REPORTED AND CALLED VALUE. Performed at [facility], [address], [place], [place] [address]    Culture ESCHERICHIA COLI (A)  Final   Report Status PENDING  Incomplete  Urine Culture     Status: Abnormal (Preliminary result)   Collection Time: 12/30/22  8:06 PM   Specimen: Urine, Random  Result Value Ref Range Status   Specimen Description URINE, RANDOM  Final   Special Requests   Final    NONE Reflexed from [id number] Performed at [facility], [address] [place], [place] [address]    Culture >=100,000 COLONIES/mL GRAM NEGATIVE RODS (A)  Final   Report Status PENDING  Incomplete         Radiology Studies: DG Chest Port 1 View  Result Date: 12/30/2022 CLINICAL DATA:  Possible sepsis. EXAM: PORTABLE CHEST 1 VIEW COMPARISON:  08/16/2022. FINDINGS: Cardiac silhouette normal in size. Moderate hiatal hernia, stable. No mediastinal or hilar masses. Clear lungs.  No convincing pleural effusion or pneumothorax. Skeletal structures are diffusely demineralized. IMPRESSION: No acute cardiopulmonary disease. Electronically Signed   By: Amie Portland M.D.   On: 12/30/2022 17:37        Scheduled Meds:  apixaban  2.5 mg Oral BID   diltiazem  120 mg Oral Daily   memantine  10 mg Oral BID   metoprolol succinate  25 mg Oral Daily   potassium chloride  40 mEq Oral  Q4H   Continuous Infusions:  sodium chloride 100 mL/hr at 12/31/22 1009   cefTRIAXone (ROCEPHIN)  IV 2 g (01/01/23 0951)     LOS: 1  day    Time spent: 40 minutes    Ramiro Harvestaniel Avery Klingbeil, MD [facility]   To contact the attending provider between 7A-7P or the covering provider during after hours 7P-7A, please log into the web site [email or web address] and access using universal [place] password for that web site. If you do not have the password, please call the hospital operator.  01/01/2023, 10:51 AM

## 2023-01-01 NOTE — Evaluation (Signed)
Occupational Therapy Evaluation  Patient Details Name: Gina Mcgee MRN: [id number] DOB: 10/06/1929 Today's Date: 01/01/2023   History of Present Illness 89 y.o. female presented with SOB and per caregiver patient was delirious. Medical history significant of A-fib (Eliquis), hypertension, bladder cancer, osteoporosis, breast cancer.   Clinical Impression   PTA patient lived with daughter and has 24/7 support and assist at home from a variety of source, she was ambulating with Rollator and needed assist with dressing and housework at baseline. Patient currently functioning similar to baseline, ambulating with Min guard to Min A and requiring Mod to Max A for Lower body dressing. Pt Sp02 remaining at 93% with activity, OT to continue to follow patient post surgery in acute setting to reassess functional status. No follow-up OT recommended at this time d/t level of home support and near baseline status.      Recommendations for follow up therapy are one component of a multi-disciplinary discharge planning process, led by the attending physician.  Recommendations may be updated based on patient status, additional functional criteria and insurance authorization.   Assistance Recommended at Discharge Intermittent Supervision/Assistance  Patient can return home with the following A little help with bathing/dressing/bathroom;A little help with walking and/or transfers;Assist for transportation;Assistance with cooking/housework;Help with stairs or ramp for entrance    Functional Status Assessment  Patient has had a recent decline in their functional status and/or demonstrates limited ability to make significant improvements in function in a reasonable and predictable amount of time  Equipment Recommendations  None recommended by OT (Pt has DME needs and assist at home)    Recommendations for Other Services       Precautions / Restrictions Precautions Precautions: Fall Restrictions Weight  Bearing Restrictions: No Other Position/Activity Restrictions: L wrist impaitement PTA, no intrinsic hand muscle strength      Mobility Bed Mobility Overal bed mobility: Needs Assistance Bed Mobility: Sit to Supine       Sit to supine: Min assist   General bed mobility comments: Pt rec'd seated in platform walker at end of OT session    Transfers Overall transfer level: Needs assistance   Transfers: Sit to/from Stand Sit to Stand: Min guard           General transfer comment: From rollator and from toilet      Balance Overall balance assessment: Needs assistance Sitting-balance support: Feet supported, No upper extremity supported Sitting balance-Leahy Scale: Fair   Postural control: Posterior lean Standing balance support: During functional activity, Reliant on assistive device for balance, Bilateral upper extremity supported Standing balance-Leahy Scale: Poor                             ADL either performed or assessed with clinical judgement   ADL Overall ADL's : Needs assistance/impaired Eating/Feeding: Minimal assistance;Sitting   Grooming: Sitting;Minimal assistance   Upper Body Bathing: Sitting;Min guard   Lower Body Bathing: Sitting/lateral leans;Moderate assistance   Upper Body Dressing : Minimal assistance;Sitting   Lower Body Dressing: Maximal assistance;Sitting/lateral leans Lower Body Dressing Details (indicate cue type and reason): doff/don socks Toilet Transfer: Minimal assistance;Ambulation   Toileting- Clothing Manipulation and Hygiene: Sitting/lateral lean;Maximal assistance Toileting - Clothing Manipulation Details (indicate cue type and reason): Max A to doff/don briefs and complete rear pericare Tub/ Shower Transfer: Ambulation;Minimal assistance;Rollator (4 wheels)   Functional mobility during ADLs: Minimal assistance;Rollator (4 wheels) General ADL Comments: Pt teetering between min guard and min A for functional  ambulation     Vision Baseline Vision/History: 1 Wears glasses       Perception     Praxis      Pertinent Vitals/Pain Pain Assessment Pain Assessment: No/denies pain     Hand Dominance Right   Extremity/Trunk Assessment Upper Extremity Assessment Upper Extremity Assessment: LUE deficits/detail;RUE deficits/detail RUE Deficits / Details: WFL LUE Deficits / Details: Pt unable to use L hand due to prior nerve injury from a previous orthopedic surgery.   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   Cervical / Trunk Assessment Cervical / Trunk Assessment: Kyphotic   Communication Communication Communication: HOH   Cognition Arousal/Alertness: Awake/alert Behavior During Therapy: WFL for tasks assessed/performed Overall Cognitive Status: Within Functional Limits for tasks assessed                                 General Comments: Pt fixated on attending wedding but she no difference in cognition noted from care aide     General Comments  VSS on RA, HR in 90s with functional ambulation and Sp02 93% on RA during activity    Exercises     Shoulder Instructions      Home Living Family/patient expects to be discharged to:: Private residence Living Arrangements: Children (Daughter Gina Mcgee) Available Help at Discharge: Family;Personal care attendant;Available 24 hours/day Type of Home: House Home Access: Level entry     Home Layout: One level     Bathroom Shower/Tub: Walk-in shower;Tub/shower unit (Uses walkin shower with assist)   Bathroom Toilet: Standard     Home Equipment: Rollator (4 wheels);Cane - single point;BSC/3in1;Shower seat;Grab bars - toilet;Grab bars - tub/shower;Hand held shower head;Wheelchair - manual   Additional Comments: Has 24 hr assistance between [employer], personal care  aide, & daughter.      Prior Functioning/Environment Prior Level of Function : Needs assist             Mobility Comments: Pt was able to ambulate short distances ("across the room") prior to admission with rollator. ADLs Comments: Assistance with dressing and iADLs from PCAs and daughters. PCA states that she can bath herself but needs assistance with getting in and out of shower and drying off.        OT Problem List: Decreased activity tolerance;Impaired balance (sitting and/or standing);Decreased knowledge of use of DME or AE      OT Treatment/Interventions: Self-care/ADL training;Therapeutic activities;Therapeutic exercise;Energy conservation;DME and/or AE instruction;Patient/family education;Balance training    OT Goals(Current goals can be found in the care plan section) Acute Rehab OT Goals Patient Stated Goal: To go home and make it to the wedding OT Goal Formulation: With patient Time For Goal Achievement: 01/15/23 Potential to Achieve Goals: Fair ADL Goals Pt Will Perform Grooming: standing;with min guard assist Pt Will Perform Upper Body Dressing: sitting;with min guard assist Pt Will Transfer to Toilet: with min guard assist;ambulating Additional ADL Goal #2: Pt will demonstrate proper use of energy conservation strategies with functional activity prn  OT Frequency: Min 2X/week    Co-evaluation              AM-PAC OT "6 Clicks" Daily Activity     Outcome Measure Help from another person eating meals?: A Little Help from another person taking care of personal grooming?: A Little Help from another  person toileting, which includes using toliet, bedpan, or urinal?: A Lot Help from another person bathing (including washing, rinsing, drying)?: A Lot Help from another person to put on and taking off regular upper body clothing?: A Little Help from another person to put on and taking off regular lower body clothing?: A Lot 6 Click Score: 15   End of Session  Equipment Utilized During Treatment: Gait belt;Rollator (4 wheels) (offset the height of UE support on Rollator) Nurse Communication: Mobility status  Activity Tolerance: Patient tolerated treatment well Patient left: in bed;with call bell/phone within reach;with family/visitor present  OT Visit Diagnosis: Unsteadiness on feet (R26.81);Other abnormalities of gait and mobility (R26.89)                Time: [id number] OT Time Calculation (min): 26 min Charges:  OT General Charges $OT Visit: 1 Visit OT Evaluation $OT Eval Moderate Complexity: 1 Mod OT Treatments $Therapeutic Activity: 8-22 mins  01/01/2023  AB, OTR/L  Acute Rehabilitation Services  Office: [phone]   Tristan Schroedernthony D Shamekia Tippets 01/01/2023, 1:00 PM

## 2023-01-01 NOTE — Progress Notes (Signed)
PT Cancellation Note  Patient Details Name: Gina Mcgee MRN: [id number] DOB: 01/31/1930   Cancelled Treatment:    Reason Eval/Treat Not Completed: Medical issues which prohibited therapy (Checked in with RN who states that pt HR is elevated at rest currently and would like therapy to hold until later. Will try again if time allows.)   [name] 01/01/2023, 8:35 AM

## 2023-01-01 NOTE — Progress Notes (Signed)
Holding area tech went to pick patient up.  The nurse called cath lab to say the patient convert to SR, EKG confirmed SR.  Notified Dr Rosemary Holms, procedure cancelled.

## 2023-01-01 NOTE — Progress Notes (Signed)
SATURATION QUALIFICATIONS: (This note is used to comply with regulatory documentation for home oxygen)  Patient Saturations on Room Air at Rest = 93%  Patient Saturations on Room Air while Ambulating = 92%  Patient Saturations on 0 Liters of oxygen while Ambulating = 93%  Please briefly explain why patient needs home oxygen:

## 2023-01-02 ENCOUNTER — Other Ambulatory Visit (HOSPITAL_COMMUNITY): Payer: Self-pay

## 2023-01-02 DIAGNOSIS — N390 Urinary tract infection, site not specified: Secondary | ICD-10-CM | POA: Diagnosis not present

## 2023-01-02 DIAGNOSIS — D649 Anemia, unspecified: Secondary | ICD-10-CM | POA: Diagnosis not present

## 2023-01-02 DIAGNOSIS — R7881 Bacteremia: Secondary | ICD-10-CM | POA: Diagnosis not present

## 2023-01-02 DIAGNOSIS — I480 Paroxysmal atrial fibrillation: Secondary | ICD-10-CM

## 2023-01-02 DIAGNOSIS — G9341 Metabolic encephalopathy: Secondary | ICD-10-CM | POA: Diagnosis not present

## 2023-01-02 DIAGNOSIS — B9620 Unspecified Escherichia coli [E. coli] as the cause of diseases classified elsewhere: Secondary | ICD-10-CM

## 2023-01-02 HISTORY — DX: Urinary tract infection, site not specified: N39.0

## 2023-01-02 HISTORY — DX: Unspecified Escherichia coli (E. coli) as the cause of diseases classified elsewhere: B96.20

## 2023-01-02 LAB — BASIC METABOLIC PANEL
Anion gap: 8 (ref 5–15)
BUN: 31 mg/dL — ABNORMAL HIGH (ref 8–23)
CO2: 24 mmol/L (ref 22–32)
Calcium: 8.7 mg/dL — ABNORMAL LOW (ref 8.9–10.3)
Chloride: 99 mmol/L (ref 98–111)
Creatinine, Ser: 1.06 mg/dL — ABNORMAL HIGH (ref 0.44–1.00)
GFR, Estimated: 50 mL/min — ABNORMAL LOW (ref >60)
Glucose, Bld: 113 mg/dL — ABNORMAL HIGH (ref 70–99)
Potassium: 4.5 mmol/L (ref 3.5–5.1)
Sodium: 131 mmol/L — ABNORMAL LOW (ref 135–145)

## 2023-01-02 LAB — URINE CULTURE: Culture: >=100000 — AB

## 2023-01-02 LAB — CBC WITH DIFFERENTIAL/PLATELET
Abs Immature Granulocytes: 0.03 10*3/uL (ref 0.00–0.07)
Basophils Absolute: 0 10*3/uL (ref 0.0–0.1)
Basophils Relative: 0 %
Eosinophils Absolute: 0.1 10*3/uL (ref 0.0–0.5)
Eosinophils Relative: 2 %
HCT: 30.4 % — ABNORMAL LOW (ref 36.0–46.0)
Hemoglobin: 9.4 g/dL — ABNORMAL LOW (ref 12.0–15.0)
Immature Granulocytes: 0 %
Lymphocytes Relative: 9 %
Lymphs Abs: 0.7 10*3/uL (ref 0.7–4.0)
MCH: 26.2 pg (ref 26.0–34.0)
MCHC: 30.9 g/dL (ref 30.0–36.0)
MCV: 84.7 fL (ref 80.0–100.0)
Monocytes Absolute: 0.6 10*3/uL (ref 0.1–1.0)
Monocytes Relative: 7 %
Neutro Abs: 6.4 10*3/uL (ref 1.7–7.7)
Neutrophils Relative %: 82 %
Platelets: 176 10*3/uL (ref 150–400)
RBC: 3.59 MIL/uL — ABNORMAL LOW (ref 3.87–5.11)
RDW: 15.1 % (ref 11.5–15.5)
WBC: 7.9 10*3/uL (ref 4.0–10.5)
nRBC: 0 % (ref 0.0–0.2)

## 2023-01-02 LAB — CULTURE, BLOOD (ROUTINE X 2)

## 2023-01-02 LAB — MAGNESIUM: Magnesium: 2.1 mg/dL (ref 1.7–2.4)

## 2023-01-02 MED ORDER — FERROUS SULFATE 325 (65 FE) MG PO TABS: 325.0000 mg | ORAL_TABLET | ORAL | 3 refills | Status: AC

## 2023-01-02 MED ORDER — FERROUS SULFATE 325 (65 FE) MG PO TABS
325.0000 mg | ORAL_TABLET | ORAL | Status: DC
  Administered 2023-01-02: 325 mg via ORAL
  Filled 2023-01-02: qty 1

## 2023-01-02 MED ORDER — DILTIAZEM HCL ER COATED BEADS 120 MG PO CP24
120.0000 mg | ORAL_CAPSULE | Freq: Every day | ORAL | 1 refills | Status: DC
  Filled 2023-01-02: qty 30, 30d supply, fill #0

## 2023-01-02 MED ORDER — AMOXICILLIN 500 MG PO CAPS: 1000.0000 mg | ORAL_CAPSULE | Freq: Two times a day (BID) | ORAL | Status: DC

## 2023-01-02 MED ORDER — METOPROLOL SUCCINATE ER 25 MG PO TB24: 25.0000 mg | ORAL_TABLET | Freq: Every day | ORAL | Status: DC

## 2023-01-02 MED ORDER — AMOXICILLIN 500 MG PO CAPS
1000.0000 mg | ORAL_CAPSULE | Freq: Two times a day (BID) | ORAL | 0 refills | Status: DC
  Filled 2023-01-02: qty 16, 4d supply, fill #0

## 2023-01-02 NOTE — TOC Transition Note (Signed)
Transition of Care [facility]) - CM/SW Discharge Note   Patient Details  Name: Gina Mcgee MRN: [id number] Date of Birth: Jul 06, 1930  [facility] CM/SW Contact:  Leone Haven, RN Phone Number: 01/02/2023, 11:45 AM   Clinical Narrative:    Patient is for dc today, her caregiver is at the bedside and family member, she has transport home. TOC to fill meds, she has no other needs.   Final next level of care: Home/Self Care     Patient Goals and CMS Choice      Discharge Placement                         Discharge Plan and Services Additional resources added to the After Visit Summary for                                       Social Determinants of Health (SDOH) Interventions SDOH Screenings   Food Insecurity: No Food Insecurity (08/19/2022)  Housing: Low Risk  (12/31/2022)  Transportation Needs: No Transportation Needs (12/31/2022)  Utilities: Not At Risk (12/31/2022)  Tobacco Use: Low Risk  (01/01/2023)     Readmission Risk Interventions     No data to display

## 2023-01-02 NOTE — Progress Notes (Signed)
Subjective:  Patient seen and examined at bedside, resting comfortably. No events overnight. She is ready to go home today as she has a family wedding tomorrow she would really like to attend. Daughter at bedside, all questions answered.   Current Facility-Administered Medications:    0.9 %  sodium chloride infusion, , Intravenous, Continuous, Rodolph Bong, MD, Last Rate: 100 mL/hr at 12/31/22 1009, New Bag at 12/31/22 1009   acetaminophen (TYLENOL) tablet 650 mg, 650 mg, Oral, Q6H PRN **OR** acetaminophen (TYLENOL) suppository 650 mg, 650 mg, Rectal, Q6H PRN, Brimage, Vondra, DO   apixaban (ELIQUIS) tablet 2.5 mg, 2.5 mg, Oral, BID, Brimage, Vondra, DO, 2.5 mg at 01/02/23 0804   cefTRIAXone (ROCEPHIN) 2 g in sodium chloride 0.9 % 100 mL IVPB, 2 g, Intravenous, Q24H, Paytes, Austin A, RPH, Last Rate: 200 mL/hr at 01/01/23 0951, 2 g at 01/01/23 0951   diltiazem (CARDIZEM CD) 24 hr capsule 120 mg, 120 mg, Oral, Daily, [name], Manish J, MD, 120 mg at 01/02/23 0803   memantine (NAMENDA) tablet 10 mg, 10 mg, Oral, BID, Brimage, Vondra, DO, 10 mg at 01/02/23 0802   metoprolol succinate (TOPROL-XL) 24 hr tablet 25 mg, 25 mg, Oral, Daily, Brimage, Vondra, DO, 25 mg at 01/02/23 0801   senna-docusate (Senokot-S) tablet 1 tablet, 1 tablet, Oral, QHS PRN, Brimage, Vondra, DO   Objective:  Vital Signs in the last 24 hours: Temp:  [97.5 F (36.4 C)-97.9 F (36.6 C)] 97.9 F (36.6 C) (04/12 0431) Pulse Rate:  [85-95] 85 (04/12 0431) Resp:  [19-35] 19 (04/12 0431) BP: (106-154)/(75-91) 154/90 (04/12 0803) SpO2:  [92 %-94 %] 92 % (04/12 0431) Weight:  [58.6 kg] 58.6 kg (04/12 0431)  Intake/Output from previous day: 04/11 0701 - 04/12 0700 In: 340 [P.O.:240; IV Piggyback:100] Out: 600 [Urine:600]  Physical Exam Vitals and nursing note reviewed.  Constitutional:      General: She is not in acute distress. Neck:     Vascular: No JVD.  Cardiovascular:     Rate and Rhythm: Normal rate and  regular rhythm.     Heart sounds: Normal heart sounds. No murmur heard. Pulmonary:     Effort: Pulmonary effort is normal.     Breath sounds: Normal breath sounds. No wheezing or rales.      Imaging/tests reviewed and independently interpreted: CXR 12/30/2022: No acute cardiopulmonary disease.   Cardiac Studies:  Telemetry 01/01/2023: A-fib with RVR  EKG 12/31/2022: Atrial fibrillation with rapid ventricular response Nonspecific T wave abnormality Abnormal ECG  Echocardiogram 08/13/2022:  1. Limited echo for edema   2. Left ventricular ejection fraction, by estimation, is 60 to 65%. Left  ventricular ejection fraction by PLAX is 65 %. The left ventricle has  normal function. The left ventricle has no regional wall motion  abnormalities. Left ventricular diastolic  parameters are consistent with Grade I diastolic dysfunction (impaired  relaxation).   3. The mitral valve is abnormal. Trivial mitral valve regurgitation.   4. Aortic valve regurgitation is mild.   Comparison(s): Changes from prior study are noted. 11/07/2021: LVEF 60-65%,  mild RV systolic dysfunction, mild AI and MR.    Assessment & Recommendations:  89 y/o Caucasian female with hypertension, PAF, mild dementia, h/o breast cancer, admitted for UTI, dyselectrolytemia EMEA, and A-fib with RVR  A-fib with RVR: Patient maintaining NSR. Will d/c on cardizem 120 mg qAM and on metoprolol succinate 25 mg qHS. Continue Eliquis 2.5 mg twice daily.  Patient stable for d/c from cardiac standpoint. Family requested  iron supplementation due to slight anemia. I have put her on ferrous sulfate q3days for one month and we will recheck hgb after that is complete. She has follow-up scheduled with Dr Rosemary Holms next week.  UTI, dyselectrolytemia: As per primary team     Clotilde Dieter, DO Pager: [phone] Office: [phone]

## 2023-01-02 NOTE — Discharge Summary (Signed)
Physician Discharge Summary  Gina Mcgee YNW:[id number] DOB: 05/08/1930 DOA: 12/30/2022  PCP: [name], MD  Admit date: 12/30/2022 Discharge date: 01/02/2023  Time spent: 60 minutes  Recommendations for Outpatient Follow-up:  Follow-up with [name], MD on 01/08/2023 at 1 PM.  On follow-up patient will need a basic metabolic profile, magnesium level, CBC done to follow-up on electrolytes, renal function, counts.  Patient started on oral iron supplementation during this hospitalization.  Anemia need to be followed up upon for further evaluation in the outpatient setting.  Patient blood pressure need to be reassessed on follow-up as patient's HCTZ was discontinued during the hospitalization. Follow-up with Dr.[name] on 01/30/2023 for follow-up on atrial fibrillation.   Discharge Diagnoses:  Principal Problem:   Acute metabolic encephalopathy Active Problems:   HTN (hypertension)   Atrial fibrillation   Bladder cancer   Lower urinary tract infectious disease   Leukocytosis   Bacteremia   Anemia   Hypotension   E coli bacteremia   E. coli UTI   Discharge Condition: Stable and improved  Diet recommendation: Regular  Filed Weights   12/31/22 1854 01/01/23 0037 01/02/23 0431  Weight: 58.6 kg 58.6 kg 58.6 kg    History of present illness:  HPI per Dr. Vita Barley is a 89 y.o. female with medical history significant of A-fib (Eliquis), hypertension, bladder cancer, osteoporosis, breast cancer who presents for acute onset confusion.  Per caregiver, patient was delirious. Patient states she felt "off" and weak since this morning.  Reports some abdominal discomfort and care caregiver says she has some urinary frequency.  Denies dysuria, fever and hematuria.  Has shortness of breath and cough but this is not new.  EMS called and she was transported to the emergency department.  Given Zofran and started on oxygen for oxygen saturations in the upper 80s by EMS.    In the ED, patient intermittently tachypneic and placed on 2 L [place].  Labs reviewed and significant for WBC 19.5 with left shift, hemoglobin 10.7, lactic acid 1.4, sodium 134, potassium 3.3, glucose 135, serum creatinine 1.06, EGFR 50, COVID, influenza and RSV negative.  Urinalysis showing leukocytes and nitrate positive with hematuria consistent with acute cystitis.  Urine culture obtained.  Chest x-ray unremarkable.  Patient given ceftriaxone and 500 cc bolus.  Hospital Course:  #1 acute metabolic encephalopathy -Likely secondary to infectious etiology of UTI and E. coli bacteremia. -Blood cultures positive for E. Coli .-Urinalysis with > 100,000 colonies of E. coli which was pansensitive. -Patient maintained on IV Rocephin during the hospitalization with discharge on oral amoxicillin to complete a course of antibiotic treatment. -Patient improved clinically and was back to baseline by day of discharge.   2.  A-fib with RVR/paroxysmal A-fib -Felt likely secondary to acute infectious etiology of UTI and bacteremia. -Patient noted to have soft/low blood pressure and noted to be in A-fib with RVR with heart rates in the 130s and sustained on 12/31/2022.. -CHA2DS2 VASC score  4. -Toprol-XL held due to low blood pressure initially and subsequently resumed as blood pressure improved.. -IV amiodarone ordered, however per floor unable to give IV amiodarone and patient has to be transferred to a progressive floor unit and received a dose of IV amiodarone 12/31/2022.  -Heart rate improved however noted still to be in A-fib early on during the hospitalization.  -Patient maintained on Eliquis for anticoagulation.   -Patient seen in consultation by cardiology who have ordered a 2D echo which had a EF of 55 to  60%, left ventricular endocardial borders not optimally defined to evaluate regional wall motion, mildly reduced right ventricular systolic function, no evidence of mitral stenosis, no evidence of mitral  valve regurgitation, no evidence of aortic stenosis.  -Due to ongoing A-fib patient was set up for cardioversion by cardiology on 01/01/2023, however just prior to cardioversion patient converted spontaneously back to normal sinus rhythm and remained in sinus rhythm for the rest of the hospitalization. -Patient maintained on home regimen Toprol-XL, Cardizem CD1 20 mg added per cardiology for better rate control. -Outpatient follow-up with cardiology.   3.  E. coli bacteremia/E. coli UTI -Bacteremia likely seeded from urine. -Urine cultures with > 100,000 colonies of E. coli which was pansensitive. -4/4 blood cultures positive for E. coli.   -Patient maintained on IV Rocephin and received 3 days of IV Rocephin during the hospitalization. -Patient remained afebrile, leukocytosis trended down from 19.5 on admission down to 7.9 by day of discharge. -Patient remained afebrile. -Patient was discharged on 4 more days of amoxicillin to complete a 7-day course of antibiotic treatment. -Discussed with ID. -Outpatient follow-up with PCP.   4.  Hypertension -BP was soft/low early on in the hospitalization improved with IV fluids, empiric IV antibiotics.   -HCTZ was held during the hospitalization and will not be resumed on discharge.    5.  Bladder cancer -Patient noted to be due for BCG treatment soon. -Status post TURBT followed by BCG therapy. -Outpatient follow-up with urology.   6.  Hypotension/soft blood pressure -On presentation to the hospital patient noted to have soft blood pressures/hypotension, noted to have been on HCTZ prior to admission which was subsequently discontinued.  Patient also noted to have a bacteremia as well as a UTI which was felt likely contributing to soft blood pressure.   -Patient placed on IV antibiotics, IV fluids and received a dose of IV amiodarone for rate control for A-fib.   -Blood pressure improved during the hospitalization and soft blood pressure had resolved  by day of discharge.   -Outpatient follow-up with PCP.    7.  Anemia -Likely anemia of chronic disease. -Patient with no overt bleeding. -Hemoglobin remained stable at 9.4.   -Patient started on oral iron tablets every 3 days per cardiology.   -Outpatient follow-up with PCP.     8.  Hypokalemia -Repleted. -Outpatient follow-up.  Procedures: Chest x-ray 12/30/2022 2D echo 01/01/2023  Consultations: Cardiology: Dr. Melton Alar 12/31/2022   Discharge Exam: Vitals:   01/02/23 0801 01/02/23 0803  BP: (!) 150/90 (!) 154/90  Pulse:  87  Resp:  17  Temp:  (!) 97.5 F (36.4 C)  SpO2:  95%    General: NAD Cardiovascular: RRR no murmurs rubs or gallops.  No JVD.  No lower extremity edema. Respiratory: Lungs clear to auscultation bilaterally.  No wheezes, no crackles, no rhonchi.  Fair air movement.  Speaking in full sentences.  Discharge Instructions   Discharge Instructions     Diet general   Complete by: As directed    Increase activity slowly   Complete by: As directed       Allergies as of 01/02/2023       Reactions   Aspirin Other (See Comments)   Gets jitter with large doses   Doxycycline Diarrhea   Fish Allergy Nausea Only, Other (See Comments)   "makes me jumpy and twitchy, but grand children are allergic"    Nsaids    Can take for short periods of time. Naprosyn -feels dazed  Shellfish-derived Products Other (See Comments)   "makes me jumpy and twitchy, but grand children are allergic"   Aloe Rash   Sulfa Antibiotics Rash        Medication List     STOP taking these medications    hydrochlorothiazide 12.5 MG tablet Commonly known as: HYDRODIURIL       TAKE these medications    amoxicillin 500 MG capsule Commonly known as: AMOXIL Take 2 capsules (1,000 mg total) by mouth every 12 (twelve) hours for 4 days. Start taking on: January 03, 2023   apixaban 2.5 MG Tabs tablet Commonly known as: Eliquis Take 1 tablet (2.5 mg total) by mouth 2 (two)  times daily.   diltiazem 120 MG 24 hr capsule Commonly known as: CARDIZEM CD Take 1 capsule (120 mg total) by mouth daily. Start taking on: January 03, 2023   ferrous sulfate 325 (65 FE) MG tablet Take 1 tablet (325 mg total) by mouth every 3 (three) days.   ketoconazole 2 % cream Commonly known as: NIZORAL Apply 1 Application topically at bedtime. Apply to toes   memantine 10 MG tablet Commonly known as: NAMENDA Take 10 mg by mouth 2 (two) times daily.   metoprolol succinate 25 MG 24 hr tablet Commonly known as: TOPROL-XL TAKE 1 TABLET(25 MG) BY MOUTH DAILY What changed:  how much to take how to take this when to take this additional instructions   senna-docusate 8.6-50 MG tablet Commonly known as: Senokot-S Take 1 tablet by mouth daily.   Vitamin D3 10 MCG (400 UNIT) Caps Generic drug: Cholecalciferol Take 400 Units by mouth in the morning, at noon, in the evening, and at bedtime.       Allergies  Allergen Reactions   Aspirin Other (See Comments)    Gets jitter with large doses   Doxycycline Diarrhea   Fish Allergy Nausea Only and Other (See Comments)    "makes me jumpy and twitchy, but grand children are allergic"    Nsaids     Can take for short periods of time. Naprosyn -feels dazed   Shellfish-Derived Products Other (See Comments)    "makes me jumpy and twitchy, but grand children are allergic"   Aloe Rash   Sulfa Antibiotics Rash    Follow-up Information     [name], MD. Go on 01/08/2023.   Specialty: Family Medicine Why: :00pm Contact information: [address] [place] [address] [phone]         Elder Negus, MD. Go on 01/30/2023.   Specialties: Cardiology, Radiology Why: :45pm Contact information: [address] [place] [address] [phone]                  The results of significant diagnostics from this hospitalization (including imaging, microbiology, ancillary and  laboratory) are listed below for reference.    Significant Diagnostic Studies: ECHOCARDIOGRAM COMPLETE  Result Date: 01/01/2023    ECHOCARDIOGRAM REPORT   Patient Name:   Gina Mcgee Date of Exam: 01/01/2023 Medical Rec #:  [id number]       Height:       62.0 in Accession #:    [id number]      Weight:       129.2 lb Date of Birth:  [birth year]       BSA:          1.588 m Patient Age:    89 years        BP:  123/77 mmHg Patient Gender: F               HR:           87 bpm. Exam Location:  Inpatient Procedure: 2D Echo, Color Doppler and Cardiac Doppler Indications:    R94.31 Abnormal EKG  History:        Patient has prior history of Echocardiogram examinations, most                 recent 08/13/2022. Abnormal ECG, Arrythmias:Atrial Fibrillation;                 Risk Factors:Hypertension.  Sonographer:    Milbert Coulter Referring Phys: [id number] [facility] CUSTOVIC  Sonographer Comments: Suboptimal parasternal window and suboptimal subcostal window. 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 IMPRESSIONS  1. Left ventricular ejection fraction, by estimation, is 55 to 60%. The left ventricle has normal function. Left ventricular endocardial border not optimally defined to evaluate regional wall motion. Left ventricular diastolic parameters are indeterminate.  2. Right ventricular systolic function is mildly reduced. The right ventricular size is normal. There is normal pulmonary artery systolic pressure. The estimated right ventricular systolic pressure is 30.2 mmHg.  3. The mitral valve is normal in structure. No evidence of mitral valve regurgitation. No evidence of mitral stenosis.  4. The aortic valve is tricuspid. Aortic valve regurgitation is mild to moderate. Aortic valve sclerosis/calcification is present, without any evidence of aortic stenosis. Aortic regurgitation PHT measures 292 msec. Aortic valve area, by VTI measures 1.92 cm. Aortic  valve mean gradient measures 5.0 mmHg. Aortic valve Vmax measures 1.53 m/s.  5. The inferior vena cava is normal in size with greater than 50% respiratory variability, suggesting right atrial pressure of 3 mmHg.  6. Consider definitiy contrast for wall motion FINDINGS  Left Ventricle: Left ventricular ejection fraction, by estimation, is 55 to 60%. The left ventricle has normal function. Left ventricular endocardial border not optimally defined to evaluate regional wall motion. The left ventricular internal cavity size was normal in size. There is no left ventricular hypertrophy. Left ventricular diastolic parameters are indeterminate. Normal left ventricular filling pressure. Right Ventricle: The right ventricular size is normal. No increase in right ventricular wall thickness. Right ventricular systolic function is mildly reduced. There is normal pulmonary artery systolic pressure. The tricuspid regurgitant velocity is 2.61 m/s, and with an assumed right atrial pressure of 3 mmHg, the estimated right ventricular systolic pressure is 30.2 mmHg. Left Atrium: Left atrial size was normal in size. Right Atrium: Right atrial size was normal in size. Pericardium: There is no evidence of pericardial effusion. Mitral Valve: The mitral valve is normal in structure. Mild mitral annular calcification. No evidence of mitral valve regurgitation. No evidence of mitral valve stenosis. Tricuspid Valve: The tricuspid valve is normal in structure. Tricuspid valve regurgitation is mild . No evidence of tricuspid stenosis. Aortic Valve: The aortic valve is tricuspid. Aortic valve regurgitation is mild to moderate. Aortic regurgitation PHT measures 292 msec. Aortic valve sclerosis/calcification is present, without any evidence of aortic stenosis. Aortic valve mean gradient measures 5.0 mmHg. Aortic valve peak gradient measures 9.4 mmHg. Aortic valve area, by VTI measures 1.92 cm. Pulmonic Valve: The pulmonic valve was normal in  structure. Pulmonic valve regurgitation is mild. No evidence of pulmonic stenosis. Aorta: The aortic root is normal in size and structure. Venous: The inferior vena cava is normal in size with greater than 50% respiratory variability, suggesting  right atrial pressure of 3 mmHg. IAS/Shunts: No atrial level shunt detected by color flow Doppler.  LEFT VENTRICLE PLAX 2D LVIDd:         3.30 cm   Diastology LVIDs:         2.30 cm   LV e' medial:    5.22 cm/s LV PW:         1.10 cm   LV E/e' medial:  14.3 LV IVS:        1.00 cm   LV e' lateral:   11.00 cm/s LVOT diam:     2.00 cm   LV E/e' lateral: 6.8 LV SV:         50 LV SV Index:   31 LVOT Area:     3.14 cm  RIGHT VENTRICLE RV S prime:     10.60 cm/s TAPSE (M-mode): 1.5 cm AORTIC VALVE AV Area (Vmax):    1.92 cm AV Area (Vmean):   1.81 cm AV Area (VTI):     1.92 cm AV Vmax:           153.00 cm/s AV Vmean:          107.000 cm/s AV VTI:            0.258 m AV Peak Grad:      9.4 mmHg AV Mean Grad:      5.0 mmHg LVOT Vmax:         93.60 cm/s LVOT Vmean:        61.600 cm/s LVOT VTI:          0.158 m LVOT/AV VTI ratio: 0.61 AI PHT:            292 msec MITRAL VALVE               TRICUSPID VALVE MV Area (PHT): 5.02 cm    TR Peak grad:   27.2 mmHg MV Decel Time: 151 msec    TR Vmax:        261.00 cm/s MV E velocity: 74.70 cm/s MV A velocity: 70.60 cm/s  SHUNTS MV E/A ratio:  1.06        Systemic VTI:  0.16 m                            Systemic Diam: 2.00 cm Armanda Magic MD Electronically signed by Armanda Magic MD Signature Date/Time: 01/01/2023/3:57:52 PM    Final    DG Chest Port 1 View  Result Date: 12/30/2022 CLINICAL DATA:  Possible sepsis. EXAM: PORTABLE CHEST 1 VIEW COMPARISON:  08/16/2022. FINDINGS: Cardiac silhouette normal in size. Moderate hiatal hernia, stable. No mediastinal or hilar masses. Clear lungs.  No convincing pleural effusion or pneumothorax. Skeletal structures are diffusely demineralized. IMPRESSION: No acute cardiopulmonary disease. Electronically  Signed   By: Amie Portland M.D.   On: 12/30/2022 17:37    Microbiology: Recent Results (from the past 240 hour(s))  Resp panel by RT-PCR (RSV, Flu A&B, Covid) Anterior Nasal Swab     Status: None   Collection Time: 12/30/22  6:05 PM   Specimen: Anterior Nasal Swab  Result Value Ref Range Status   SARS Coronavirus 2 by RT PCR NEGATIVE NEGATIVE Final   Influenza A by PCR NEGATIVE NEGATIVE Final   Influenza B by PCR NEGATIVE NEGATIVE Final    Comment: (NOTE) The Xpert Xpress SARS-CoV-2/FLU/RSV plus assay is intended as an aid in the diagnosis of influenza from Nasopharyngeal swab specimens and  should not be used as a sole basis for treatment. Nasal washings and aspirates are unacceptable for Xpert Xpress SARS-CoV-2/FLU/RSV testing.  Fact Sheet for Patients: BloggerCourse.com  Fact Sheet for Healthcare Providers: SeriousBroker.it  This test is not yet approved or cleared by the [place] FDA and has been authorized for detection and/or diagnosis of SARS-CoV-2 by FDA under an Emergency Use Authorization (EUA). This EUA will remain in effect (meaning this test can be used) for the duration of the COVID-19 declaration under Section 564(b)(1) of the Act, 21 U.S.C. section 360bbb-3(b)(1), unless the authorization is terminated or revoked.     Resp Syncytial Virus by PCR NEGATIVE NEGATIVE Final    Comment: (NOTE) Fact Sheet for Patients: BloggerCourse.com  Fact Sheet for Healthcare Providers: SeriousBroker.it  This test is not yet approved or cleared by the [place] FDA and has been authorized for detection and/or diagnosis of SARS-CoV-2 by FDA under an Emergency Use Authorization (EUA). This EUA will remain in effect (meaning this test can be used) for the duration of the COVID-19 declaration under Section 564(b)(1) of the Act, 21 U.S.C. section 360bbb-3(b)(1), unless the  authorization is terminated or revoked.  Performed at [facility], [address], [place], [place] [address]   Blood Culture (routine x 2)     Status: Abnormal   Collection Time: 12/30/22  6:20 PM   Specimen: BLOOD  Result Value Ref Range Status   Specimen Description BLOOD RIGHT FOREARM  Final   Special Requests   Final    BOTTLES DRAWN AEROBIC AND ANAEROBIC Blood Culture results may not be optimal due to an excessive volume of blood received in culture bottles   Culture  Setup Time   Final    GRAM NEGATIVE RODS IN BOTH AEROBIC AND ANAEROBIC BOTTLES CRITICAL RESULT CALLED TO, READ BACK BY AND VERIFIED WITH: PHARMD AUSTIN PAYTES [id number] BY JRS. Performed at [facility], [address], [place], [place] [address]    Culture ESCHERICHIA COLI (A)  Final   Report Status 01/02/2023 FINAL  Final   Organism ID, Bacteria ESCHERICHIA COLI  Final      Susceptibility   Escherichia coli - MIC*    AMPICILLIN <=2 SENSITIVE Sensitive     CEFEPIME <=0.12 SENSITIVE Sensitive     CEFTAZIDIME <=1 SENSITIVE Sensitive     CEFTRIAXONE <=0.25 SENSITIVE Sensitive     CIPROFLOXACIN <=0.25 SENSITIVE Sensitive     GENTAMICIN <=1 SENSITIVE Sensitive     IMIPENEM <=0.25 SENSITIVE Sensitive     TRIMETH/SULFA <=20 SENSITIVE Sensitive     AMPICILLIN/SULBACTAM <=2 SENSITIVE Sensitive     PIP/TAZO <=4 SENSITIVE Sensitive     * ESCHERICHIA COLI  Blood Culture ID Panel (Reflexed)     Status: Abnormal   Collection Time: 12/30/22  6:20 PM  Result Value Ref Range Status   Enterococcus faecalis NOT DETECTED NOT DETECTED Final   Enterococcus Faecium NOT DETECTED NOT DETECTED Final   Listeria monocytogenes NOT DETECTED NOT DETECTED Final   Staphylococcus species NOT DETECTED NOT DETECTED Final   Staphylococcus aureus (BCID) NOT DETECTED NOT DETECTED Final   Staphylococcus epidermidis NOT DETECTED NOT DETECTED Final   Staphylococcus lugdunensis NOT DETECTED NOT DETECTED Final   Streptococcus  species NOT DETECTED NOT DETECTED Final   Streptococcus agalactiae NOT DETECTED NOT DETECTED Final   Streptococcus pneumoniae NOT DETECTED NOT DETECTED Final   Streptococcus pyogenes NOT DETECTED NOT DETECTED Final   A.calcoaceticus-baumannii NOT DETECTED NOT DETECTED Final   Bacteroides  fragilis NOT DETECTED NOT DETECTED Final   Enterobacterales DETECTED (A) NOT DETECTED Final    Comment: Enterobacterales represent a large order of gram negative bacteria, not a single organism. CRITICAL RESULT CALLED TO, READ BACK BY AND VERIFIED WITH: PHARMD AUSTIN PAYTES [id number] BY JRS    Enterobacter cloacae complex NOT DETECTED NOT DETECTED Final   Escherichia coli DETECTED (A) NOT DETECTED Final    Comment: CRITICAL RESULT CALLED TO, READ BACK BY AND VERIFIED WITH: PHARMD AUSTIN PAYTES [id number] BY JRS    Klebsiella aerogenes NOT DETECTED NOT DETECTED Final   Klebsiella oxytoca NOT DETECTED NOT DETECTED Final   Klebsiella pneumoniae NOT DETECTED NOT DETECTED Final   Proteus species NOT DETECTED NOT DETECTED Final   Salmonella species NOT DETECTED NOT DETECTED Final   Serratia marcescens NOT DETECTED NOT DETECTED Final   Haemophilus influenzae NOT DETECTED NOT DETECTED Final   Neisseria meningitidis NOT DETECTED NOT DETECTED Final   Pseudomonas aeruginosa NOT DETECTED NOT DETECTED Final   Stenotrophomonas maltophilia NOT DETECTED NOT DETECTED Final   Candida albicans NOT DETECTED NOT DETECTED Final   Candida auris NOT DETECTED NOT DETECTED Final   Candida glabrata NOT DETECTED NOT DETECTED Final   Candida krusei NOT DETECTED NOT DETECTED Final   Candida parapsilosis NOT DETECTED NOT DETECTED Final   Candida tropicalis NOT DETECTED NOT DETECTED Final   Cryptococcus neoformans/gattii NOT DETECTED NOT DETECTED Final   CTX-M ESBL NOT DETECTED NOT DETECTED Final   Carbapenem resistance IMP NOT DETECTED NOT DETECTED Final   Carbapenem resistance KPC NOT DETECTED NOT DETECTED Final    Carbapenem resistance NDM NOT DETECTED NOT DETECTED Final   Carbapenem resist OXA 48 LIKE NOT DETECTED NOT DETECTED Final   Carbapenem resistance VIM NOT DETECTED NOT DETECTED Final    Comment: Performed at [facility], [address] [place], [place] [address]  Blood Culture (routine x 2)     Status: Abnormal   Collection Time: 12/30/22  6:25 PM   Specimen: BLOOD  Result Value Ref Range Status   Specimen Description BLOOD RIGHT ARM  Final   Special Requests   Final    BOTTLES DRAWN AEROBIC AND ANAEROBIC Blood Culture results may not be optimal due to an excessive volume of blood received in culture bottles   Culture  Setup Time   Final    GRAM NEGATIVE RODS IN BOTH AEROBIC AND ANAEROBIC BOTTLES CRITICAL VALUE NOTED.  VALUE IS CONSISTENT WITH PREVIOUSLY REPORTED AND CALLED VALUE.    Culture (A)  Final    ESCHERICHIA COLI SUSCEPTIBILITIES PERFORMED ON PREVIOUS CULTURE WITHIN THE LAST 5 DAYS. Performed at [facility], [address], [place], [place] [address]    Report Status 01/02/2023 FINAL  Final  Urine Culture     Status: Abnormal   Collection Time: 12/30/22  8:06 PM   Specimen: Urine, Random  Result Value Ref Range Status   Specimen Description URINE, RANDOM  Final   Special Requests   Final    NONE Reflexed from [id number] Performed at [facility], [address] [place], [place] [address]    Culture >=100,000 COLONIES/mL ESCHERICHIA COLI (A)  Final   Report Status 01/02/2023 FINAL  Final   Organism ID, Bacteria ESCHERICHIA COLI (A)  Final      Susceptibility   Escherichia coli - MIC*    AMPICILLIN <=2 SENSITIVE Sensitive     CEFAZOLIN <=4 SENSITIVE Sensitive     CEFEPIME <=0.12 SENSITIVE Sensitive  CEFTRIAXONE <=0.25 SENSITIVE Sensitive     CIPROFLOXACIN <=0.25 SENSITIVE Sensitive     GENTAMICIN <=1 SENSITIVE Sensitive     IMIPENEM <=0.25 SENSITIVE Sensitive     NITROFURANTOIN <=16 SENSITIVE Sensitive     TRIMETH/SULFA <=20 SENSITIVE Sensitive      AMPICILLIN/SULBACTAM <=2 SENSITIVE Sensitive     PIP/TAZO <=4 SENSITIVE Sensitive     * >=100,000 COLONIES/mL ESCHERICHIA COLI     Labs: Basic Metabolic Panel: Recent Labs  Lab 12/30/22 1805 12/31/22 0625 01/01/23 0050 01/02/23 0045  NA 134* 132* 132* 131*  K 3.3* 3.5 3.2* 4.5  CL 98 98 97* 99  CO2 25 27 25 24   GLUCOSE 135* 125* 117* 113*  BUN 25* 26* 35* 31*  CREATININE 1.06* 1.27* 1.27* 1.06*  CALCIUM 9.3 8.9 8.6* 8.7*  MG  --   --  2.0 2.1   Liver Function Tests: Recent Labs  Lab 12/30/22 1805  AST 29  ALT 17  ALKPHOS 84  BILITOT 0.6  PROT 6.7  ALBUMIN 3.7   No results for input(s): "LIPASE", "AMYLASE" in the last 168 hours. No results for input(s): "AMMONIA" in the last 168 hours. CBC: Recent Labs  Lab 12/30/22 1805 12/31/22 0625 01/01/23 0050 01/02/23 0045  WBC 19.5* 19.5* 12.7* 7.9  NEUTROABS 17.7*  --  11.3* 6.4  HGB 10.7* 9.8* 9.3* 9.4*  HCT 34.0* 30.0* 29.9* 30.4*  MCV 85.2 82.9 84.9 84.7  PLT 209 187 159 176   Cardiac Enzymes: No results for input(s): "CKTOTAL", "CKMB", "CKMBINDEX", "TROPONINI" in the last 168 hours. BNP: BNP (last 3 results) Recent Labs    07/24/22 1123  BNP 111.3*    ProBNP (last 3 results) No results for input(s): "PROBNP" in the last 8760 hours.  CBG: No results for input(s): "GLUCAP" in the last 168 hours.     Signed:  Ramiro Harvest MD.  [facility] 01/02/2023, 11:40 AM

## 2023-01-02 NOTE — TOC Initial Note (Signed)
Transition of Care [facility]) - Initial/Assessment Note    Patient Details  Name: Gina Mcgee MRN: [id number] Date of Birth: 03/03/1930  Transition [facility]) CM/SW Contact:    Leone Haven, RN Phone Number: 01/02/2023, 11:48 AM  Clinical Narrative:                 Patient is for dc today, her caregiver is at the bedside and family member, she has transport home. TOC to fill meds, she has no other needs.   Expected Discharge Plan: Home/Self Care Barriers to Discharge: No Barriers Identified   Patient Goals and CMS Choice Patient states their goals for this hospitalization and ongoing recovery are:: return home   Choice offered to / list presented to : NA      Expected Discharge Plan and Services In-house Referral: NA Discharge Planning Services: CM Consult Post Acute Care Choice: NA Living arrangements for the past 2 months: Single Family Home Expected Discharge Date: 01/02/23               DME Arranged: N/A DME Agency: NA       HH Arranged: NA          Prior Living Arrangements/Services Living arrangements for the past 2 months: Single Family Home Lives with:: Adult Children Patient language and need for interpreter reviewed:: Yes Do you feel safe going back to the place where you live?: Yes      Need for Family Participation in Patient Care: Yes (Comment) Care giver support system in place?: Yes (comment) Current home services: Homehealth aide Criminal Activity/Legal Involvement Pertinent to Current Situation/Hospitalization: No - Comment as needed  Activities of Daily Living   ADL Screening (condition at time of admission) Is the patient deaf or have difficulty hearing?: No Does the patient have difficulty seeing, even when wearing glasses/contacts?: Yes Does the patient have difficulty concentrating, remembering, or making decisions?: Yes Does the patient have difficulty dressing or bathing?: Yes Does the patient have difficulty walking or  climbing stairs?: Yes  Permission Sought/Granted                  Emotional Assessment       Orientation: : Oriented to Self, Oriented to Place, Oriented to  Time Alcohol / Substance Use: Not Applicable Psych Involvement: No (comment)  Admission diagnosis:  Lower urinary tract infectious disease [N39.0] Delirium [R41.0] Weakness [R53.1] Acute encephalopathy [G93.40] Patient Active Problem List   Diagnosis Date Noted   E. coli UTI 01/02/2023   Bacteremia 12/31/2022   Acute metabolic encephalopathy 12/31/2022   Anemia 12/31/2022   Hypotension 12/31/2022   E coli bacteremia 12/31/2022   Acute encephalopathy 12/30/2022   Lower urinary tract infectious disease 12/30/2022   Weakness 12/30/2022   Leukocytosis 12/30/2022   Bladder cancer 08/18/2022   MCI (mild cognitive impairment) 08/18/2022   RSV (respiratory syncytial virus pneumonia) 08/18/2022   Hypoxic respiratory failure 08/18/2022   CAP (community acquired pneumonia) 08/17/2022   Atrial fibrillation 04/16/2022   Gastroesophageal reflux disease without esophagitis 11/26/2020   Pernicious anemia 11/26/2020   Osteoporosis    Abnormal CXR 01/20/2013   Preop cardiovascular exam 01/20/2013   Surgery, elective 01/18/2013   Fracture of pubic ramus 11/21/2011   Hip pain, acute 11/20/2011   Fall 11/20/2011   Hyponatremia 11/20/2011   Hypokalemia 11/20/2011   Pelvic fracture 11/20/2011   HTN (hypertension)    Concussion    History of breast cancer in female    PCP:  Corliss Blacker,  Toniann Fail, MD Pharmacy:   [facility] [address] - [place], [place] - [address] [facility] [place] [address] Phone: [phone] Fax: [phone]  [id number] [place], [place] - [address] AT [facility] [address] [facility] [address] [place] [address] Phone: [phone] Fax: [phone]  [facility] [address] [place] [address] Phone:  [phone] Fax: [phone]     Social Determinants of Health (SDOH) Social History: SDOH Screenings   Food Insecurity: No Food Insecurity (08/19/2022)  Housing: Low Risk  (12/31/2022)  Transportation Needs: No Transportation Needs (12/31/2022)  Utilities: Not At Risk (12/31/2022)  Tobacco Use: Low Risk  (01/01/2023)   SDOH Interventions: Housing Interventions: Patient Refused   Readmission Risk Interventions    01/02/2023   11:46 AM  Readmission Risk Prevention Plan  Transportation Screening Complete  PCP or Specialist Appt within 3-5 Days Complete  HRI or Home Care Consult Complete  Palliative Care Screening Not Applicable  Medication Review (RN Care Manager) Complete

## 2023-01-02 NOTE — Plan of Care (Signed)

## 2023-01-05 ENCOUNTER — Emergency Department (HOSPITAL_COMMUNITY): Payer: Medicare Other

## 2023-01-05 ENCOUNTER — Emergency Department (HOSPITAL_COMMUNITY)
Admission: EM | Admit: 2023-01-05 | Discharge: 2023-01-06 | Disposition: A | Discharge status: Home / Self Care | Payer: Medicare Other | Attending: Emergency Medicine | Admitting: Emergency Medicine

## 2023-01-05 ENCOUNTER — Telehealth: Payer: Self-pay

## 2023-01-05 DIAGNOSIS — R059 Cough, unspecified: Secondary | ICD-10-CM | POA: Diagnosis present

## 2023-01-05 DIAGNOSIS — Z853 Personal history of malignant neoplasm of breast: Secondary | ICD-10-CM | POA: Insufficient documentation

## 2023-01-05 DIAGNOSIS — J168 Pneumonia due to other specified infectious organisms: Secondary | ICD-10-CM | POA: Diagnosis not present

## 2023-01-05 DIAGNOSIS — J181 Lobar pneumonia, unspecified organism: Secondary | ICD-10-CM | POA: Insufficient documentation

## 2023-01-05 DIAGNOSIS — Z1152 Encounter for screening for COVID-19: Secondary | ICD-10-CM | POA: Diagnosis not present

## 2023-01-05 DIAGNOSIS — Z8551 Personal history of malignant neoplasm of bladder: Secondary | ICD-10-CM | POA: Diagnosis not present

## 2023-01-05 DIAGNOSIS — Z7901 Long term (current) use of anticoagulants: Secondary | ICD-10-CM | POA: Insufficient documentation

## 2023-01-05 DIAGNOSIS — R0602 Shortness of breath: Secondary | ICD-10-CM | POA: Diagnosis not present

## 2023-01-05 DIAGNOSIS — I4891 Unspecified atrial fibrillation: Secondary | ICD-10-CM | POA: Diagnosis not present

## 2023-01-05 DIAGNOSIS — J189 Pneumonia, unspecified organism: Secondary | ICD-10-CM

## 2023-01-05 DIAGNOSIS — N1330 Unspecified hydronephrosis: Secondary | ICD-10-CM | POA: Diagnosis not present

## 2023-01-05 DIAGNOSIS — K449 Diaphragmatic hernia without obstruction or gangrene: Secondary | ICD-10-CM | POA: Diagnosis not present

## 2023-01-05 DIAGNOSIS — N281 Cyst of kidney, acquired: Secondary | ICD-10-CM | POA: Diagnosis not present

## 2023-01-05 DIAGNOSIS — J90 Pleural effusion, not elsewhere classified: Secondary | ICD-10-CM | POA: Diagnosis not present

## 2023-01-05 LAB — URINALYSIS, ROUTINE W REFLEX MICROSCOPIC
Bacteria, UA: NONE SEEN
Bilirubin Urine: NEGATIVE
Glucose, UA: NEGATIVE mg/dL
Ketones, ur: NEGATIVE mg/dL
Leukocytes,Ua: NEGATIVE
Nitrite: NEGATIVE
Protein, ur: NEGATIVE mg/dL
Specific Gravity, Urine: 1.01 (ref 1.005–1.030)
pH: 7 (ref 5.0–8.0)

## 2023-01-05 LAB — COMPREHENSIVE METABOLIC PANEL
ALT: 22 U/L (ref 0–44)
AST: 24 U/L (ref 15–41)
Albumin: 3.4 g/dL — ABNORMAL LOW (ref 3.5–5.0)
Alkaline Phosphatase: 88 U/L (ref 38–126)
Anion gap: 9 (ref 5–15)
BUN: 16 mg/dL (ref 8–23)
CO2: 26 mmol/L (ref 22–32)
Calcium: 9.2 mg/dL (ref 8.9–10.3)
Chloride: 99 mmol/L (ref 98–111)
Creatinine, Ser: 0.91 mg/dL (ref 0.44–1.00)
GFR, Estimated: 60 mL/min — ABNORMAL LOW (ref >60)
Glucose, Bld: 111 mg/dL — ABNORMAL HIGH (ref 70–99)
Potassium: 3.6 mmol/L (ref 3.5–5.1)
Sodium: 134 mmol/L — ABNORMAL LOW (ref 135–145)
Total Bilirubin: 0.4 mg/dL (ref 0.3–1.2)
Total Protein: 7 g/dL (ref 6.5–8.1)

## 2023-01-05 LAB — CBC WITH DIFFERENTIAL/PLATELET
Abs Immature Granulocytes: 0.19 10*3/uL — ABNORMAL HIGH (ref 0.00–0.07)
Basophils Absolute: 0.1 10*3/uL (ref 0.0–0.1)
Basophils Relative: 1 %
Eosinophils Absolute: 0.3 10*3/uL (ref 0.0–0.5)
Eosinophils Relative: 3 %
HCT: 32.7 % — ABNORMAL LOW (ref 36.0–46.0)
Hemoglobin: 10.3 g/dL — ABNORMAL LOW (ref 12.0–15.0)
Immature Granulocytes: 2 %
Lymphocytes Relative: 27 %
Lymphs Abs: 2.2 10*3/uL (ref 0.7–4.0)
MCH: 26.5 pg (ref 26.0–34.0)
MCHC: 31.5 g/dL (ref 30.0–36.0)
MCV: 84.3 fL (ref 80.0–100.0)
Monocytes Absolute: 0.5 10*3/uL (ref 0.1–1.0)
Monocytes Relative: 6 %
Neutro Abs: 5.1 10*3/uL (ref 1.7–7.7)
Neutrophils Relative %: 61 %
Platelets: 291 10*3/uL (ref 150–400)
RBC: 3.88 MIL/uL (ref 3.87–5.11)
RDW: 15.1 % (ref 11.5–15.5)
WBC: 8.3 10*3/uL (ref 4.0–10.5)
nRBC: 0 % (ref 0.0–0.2)

## 2023-01-05 LAB — BRAIN NATRIURETIC PEPTIDE: B Natriuretic Peptide: 355.5 pg/mL — ABNORMAL HIGH (ref 0.0–100.0)

## 2023-01-05 LAB — RESP PANEL BY RT-PCR (RSV, FLU A&B, COVID)  RVPGX2
Influenza A by PCR: NEGATIVE
Influenza B by PCR: NEGATIVE
Resp Syncytial Virus by PCR: NEGATIVE
SARS Coronavirus 2 by RT PCR: NEGATIVE

## 2023-01-05 LAB — TROPONIN I (HIGH SENSITIVITY)
Troponin I (High Sensitivity): 24 ng/L — ABNORMAL HIGH (ref <18)
Troponin I (High Sensitivity): 23 ng/L — ABNORMAL HIGH (ref <18)

## 2023-01-05 MED ORDER — FUROSEMIDE 10 MG/ML IJ SOLN
20.0000 mg | Freq: Once | INTRAMUSCULAR | Status: AC
  Administered 2023-01-05: 20 mg via INTRAVENOUS
  Filled 2023-01-05: qty 2

## 2023-01-05 MED ORDER — AMOXICILLIN 500 MG PO CAPS
1000.0000 mg | ORAL_CAPSULE | Freq: Once | ORAL | Status: AC
  Administered 2023-01-05: 1000 mg via ORAL
  Filled 2023-01-05: qty 2

## 2023-01-05 MED ORDER — IOHEXOL 350 MG/ML SOLN
75.0000 mL | Freq: Once | INTRAVENOUS | Status: AC | PRN
  Administered 2023-01-05: 75 mL via INTRAVENOUS

## 2023-01-05 NOTE — Telephone Encounter (Signed)
Called patient or TOC no answer left a vm

## 2023-01-05 NOTE — ED Provider Triage Note (Signed)
Emergency Medicine Provider Triage Evaluation Note  Gina Mcgee , a 89 y.o. female  was evaluated in triage.  Pt complains of cough and shortness of breath that started 2 days ago.  Patient currently undergoing treatment for bladder cancer.  Patient was hospitalized last week for sepsis secondary to E. coli UTI for which she received IV antibiotics.  Daughter reports that discharge was expedited as patient stated that she had to attend a wedding this weekend.  Somewhat of a cough that started in the hospital but it did not get significant until 2 days ago.  Daughter states that patient has complained of some chest pain but patient denies this.  Patient denies fever, chills, nausea, vomiting, or diarrhea.  Review of Systems  Positive: See HPI Negative: See HPI  Physical Exam  BP (!) 150/104 (BP Location: Right Arm)   Pulse 79   Temp 98.2 F (36.8 C) (Oral)   Resp 16   SpO2 93%  Gen:   Awake, no distress   Resp:  Normal effort, minimal crackles scattered throughout, no signs of respiratory distress MSK:   Moves extremities without difficulty no lower extremity edema Other:  Regular rate with irregular rhythm, patient neurologically  Medical Decision Making  Medically screening exam initiated at 4:33 PM.  Appropriate orders placed.  Gina Mcgee was informed that the remainder of the evaluation will be completed by another provider, this initial triage assessment does not replace that evaluation, and the importance of remaining in the ED until their evaluation is complete.     Tonette Lederer, PA-C 01/05/23 1635

## 2023-01-05 NOTE — ED Provider Notes (Incomplete)
[place] EMERGENCY DEPARTMENT AT [facility] Provider Note   CSN: [id number] Arrival date & time: 01/05/23  1544     History {Add pertinent medical, surgical, social history, OB history to HPI:1} Chief Complaint  Patient presents with   Cough    Gina Mcgee is a 89 y.o. female.   Cough Associated symptoms: shortness of breath   Patient presenting for cough and shortness of breath.  Medical history includes mild cognitive impairment, anemia, atrial fibrillation, GERD, osteoporosis, remote breast cancer, current bladder cancer.  She was recently hospitalized for metabolic encephalopathy secondary to UTI.  While in the hospital, she had atrial fibrillation with RVR.  She was treated with amiodarone which was discontinued.  She was discharged home on Eliquis, metoprolol, Cardizem.  She is not on a diuretic.  She was treated with Rocephin while in the hospital and discharged on oral amoxicillin.  She was discharged 3 days ago.  Onset of cough and shortness of breath was 2 days ago.  She went to her grandsons wedding today.  She arrives in the ED with her daughter who is concerned of patient seeming rundown lately.  Patient denies any current complaints.     Home Medications Prior to Admission medications   Medication Sig Start Date End Date Taking? Authorizing Provider  amoxicillin (AMOXIL) 500 MG capsule Take 2 capsules (1,000 mg total) by mouth every 12 (twelve) hours for 4 days. 01/03/23 01/07/23  Rodolph Bong, MD  apixaban (ELIQUIS) 2.5 MG TABS tablet Take 1 tablet (2.5 mg total) by mouth 2 (two) times daily. 12/10/22   [name], [name], MD  Cholecalciferol (VITAMIN D3) 10 MCG (400 UNIT) CAPS Take 400 Units by mouth in the morning, at noon, in the evening, and at bedtime.    [provider]  diltiazem (CARDIZEM CD) 120 MG 24 hr capsule Take 1 capsule (120 mg total) by mouth daily. 01/03/23   Rodolph Bong, MD  ferrous sulfate 325 (65 FE) MG tablet  Take 1 tablet (325 mg total) by mouth every 3 (three) days. 01/02/23   Rodolph Bong, MD  ketoconazole (NIZORAL) 2 % cream Apply 1 Application topically at bedtime. Apply to toes    [provider]  memantine (NAMENDA) 10 MG tablet Take 10 mg by mouth 2 (two) times daily.    [provider]  metoprolol succinate (TOPROL-XL) 25 MG 24 hr tablet TAKE 1 TABLET(25 MG) BY MOUTH DAILY Patient taking differently: Take 25 mg by mouth daily. 12/10/22   [name], Manish J, MD  senna-docusate (SENOKOT-S) 8.6-50 MG tablet Take 1 tablet by mouth daily.    [provider]      Allergies    Aspirin, Doxycycline, Fish allergy, Nsaids, Shellfish-derived products, Aloe, and Sulfa antibiotics    Review of Systems   Review of Systems  Constitutional:  Positive for fatigue.  Respiratory:  Positive for cough and shortness of breath.   All other systems reviewed and are negative.   Physical Exam Updated Vital Signs BP (!) 150/104 (BP Location: Right Arm)   Pulse 79   Temp 98.2 F (36.8 C) (Oral)   Resp 16   SpO2 93%  Physical Exam Vitals and nursing note reviewed.  Constitutional:      General: She is not in acute distress.    Appearance: Normal appearance. She is well-developed. She is not ill-appearing, toxic-appearing or diaphoretic.  HENT:     Head: Normocephalic and atraumatic.     Right Ear: External  ear normal.     Left Ear: External ear normal.     Nose: Nose normal.     Mouth/Throat:     Mouth: Mucous membranes are moist.  Eyes:     Extraocular Movements: Extraocular movements intact.     Conjunctiva/sclera: Conjunctivae normal.  Cardiovascular:     Rate and Rhythm: Normal rate and regular rhythm.     Heart sounds: No murmur heard. Pulmonary:     Effort: Pulmonary effort is normal. No respiratory distress.     Breath sounds: Examination of the right-lower field reveals decreased breath sounds and rales. Examination of the left-lower field reveals  decreased breath sounds and rales. Decreased breath sounds and rales present. No wheezing or rhonchi.  Abdominal:     General: There is no distension.     Palpations: Abdomen is soft.     Tenderness: There is no abdominal tenderness.  Musculoskeletal:        General: No swelling. Normal range of motion.     Cervical back: Normal range of motion and neck supple.     Right lower leg: No edema.     Left lower leg: No edema.  Skin:    General: Skin is warm and dry.  Neurological:     General: No focal deficit present.     Mental Status: She is alert and oriented to person, place, and time.     Cranial Nerves: No cranial nerve deficit.     Sensory: No sensory deficit.     Motor: No weakness.     Coordination: Coordination normal.  Psychiatric:        Mood and Affect: Mood normal.        Behavior: Behavior normal.        Thought Content: Thought content normal.        Judgment: Judgment normal.     ED Results / Procedures / Treatments   Labs (all labs ordered are listed, but only abnormal results are displayed) Labs Reviewed  COMPREHENSIVE METABOLIC PANEL - Abnormal; Notable for the following components:      Result Value   Sodium 134 (*)    Glucose, Bld 111 (*)    Albumin 3.4 (*)    GFR, Estimated 60 (*)    All other components within normal limits  CBC WITH DIFFERENTIAL/PLATELET - Abnormal; Notable for the following components:   Hemoglobin 10.3 (*)    HCT 32.7 (*)    Abs Immature Granulocytes 0.19 (*)    All other components within normal limits  BRAIN NATRIURETIC PEPTIDE - Abnormal; Notable for the following components:   B Natriuretic Peptide 355.5 (*)    All other components within normal limits  TROPONIN I (HIGH SENSITIVITY) - Abnormal; Notable for the following components:   Troponin I (High Sensitivity) 24 (*)    All other components within normal limits  TROPONIN I (HIGH SENSITIVITY) - Abnormal; Notable for the following components:   Troponin I (High  Sensitivity) 23 (*)    All other components within normal limits  SARS CORONAVIRUS 2 BY RT PCR  URINALYSIS, ROUTINE W REFLEX MICROSCOPIC    EKG None  Radiology DG Chest 2 View  Result Date: 01/05/2023 CLINICAL DATA:  sob EXAM: CHEST - 2 VIEW COMPARISON:  Chest x-ray 12/30/2022 FINDINGS: The heart and mediastinal contours are within normal limits. Aortic calcification. Hiatal hernia. Low lung volumes with question left lower lobe airspace opacity and possible trace pleural effusion. No focal consolidation. Chronic coarsened markings with no overt  pulmonary edema. No right pleural effusion. No pneumothorax. No acute osseous abnormality. IMPRESSION: 1. Low lung volumes with question left lower lobe airspace opacity and possible trace pleural effusion. Recommend repeat PA and lateral chest x-ray with improved inspiratory effort. 2. Hiatal hernia. 3.  Aortic Atherosclerosis (ICD10-I70.0). Electronically Signed   By: Tish Frederickson M.D.   On: 01/05/2023 17:48    Procedures Procedures  {Document cardiac monitor, telemetry assessment procedure when appropriate:1}  Medications Ordered in ED Medications - No data to display  ED Course/ Medical Decision Making/ A&P   {   Click here for ABCD2, HEART and other calculatorsREFRESH Note before signing :1}                          Medical Decision Making Amount and/or Complexity of Data Reviewed Radiology: ordered.  Risk Prescription drug management.   This patient presents to the ED for concern of ***, this involves an extensive number of treatment options, and is a complaint that carries with it a high risk of complications and morbidity.  The differential diagnosis includes ***   Co morbidities that complicate the patient evaluation  ***   Additional history obtained:  Additional history obtained from *** External records from outside source obtained and reviewed including ***   Lab Tests:  I Ordered, and personally interpreted  labs.  The pertinent results include:  ***   Imaging Studies ordered:  I ordered imaging studies including ***  I independently visualized and interpreted imaging which showed *** I agree with the radiologist interpretation   Cardiac Monitoring: / EKG:  The patient was maintained on a cardiac monitor.  I personally viewed and interpreted the cardiac monitored which showed an underlying rhythm of: ***   Consultations Obtained:  I requested consultation with the ***,  and discussed lab and imaging findings as well as pertinent plan - they recommend: ***   Problem List / ED Course / Critical interventions / Medication management  Patient presents for fatigue, cough, shortness of breath over the past 2 days.  This follows a recent hospitalization where she was found to have E. coli UTI and bacteremia.  She is currently on day 6 of 7 of antibiotics. On arrival in the ED, patient is well-appearing.  She is in a normal sinus rhythm.  Blood pressure is moderately elevated.  Daughter reports that she was told to discontinue HCTZ at time of discharge.  She has resumed taking this today due to high blood pressures at home.  Patient currently has no complaints.  Her breathing is unlabored.  SpO2 is normal on room air.  On lung auscultation, she does have diminished breath sounds and crackles bibasilarly.  Patient may have been fluid overloaded while in the hospital.  She may have also contracted a URI.  Initial lab work shows baseline anemia, no leukocytosis, mild elevation in troponin, and BNP that is elevated to 350.  Dose of Lasix was ordered.  COVID and flu swab was ordered.  Patient to undergo CTA chest.***. I ordered medication including ***  for ***  Reevaluation of the patient after these medicines showed that the patient {resolved/improved/worsened:23923::"improved"} I have reviewed the patients home medicines and have made adjustments as needed   Social Determinants of  Health:  ***   Test / Admission - Considered:  ***   {Document critical care time when appropriate:1} {Document review of labs and clinical decision tools ie heart score, Chads2Vasc2 etc:1}  {Document your  independent review of radiology images, and any outside records:1} {Document your discussion with family members, caretakers, and with consultants:1} {Document social determinants of health affecting pt's care:1} {Document your decision making why or why not admission, treatments were needed:1} Final Clinical Impression(s) / ED Diagnoses Final diagnoses:  None    Rx / DC Orders ED Discharge Orders     None

## 2023-01-05 NOTE — ED Triage Notes (Signed)
Patient here for evaluation of a deep cough that started a few days ago. Patient complains of right back pain. Patient is alert, oriented, and in no apparent distress at this time.

## 2023-01-06 ENCOUNTER — Emergency Department (HOSPITAL_COMMUNITY): Payer: Medicare Other

## 2023-01-06 DIAGNOSIS — J181 Lobar pneumonia, unspecified organism: Secondary | ICD-10-CM | POA: Diagnosis not present

## 2023-01-06 DIAGNOSIS — N281 Cyst of kidney, acquired: Secondary | ICD-10-CM | POA: Diagnosis not present

## 2023-01-06 DIAGNOSIS — N1330 Unspecified hydronephrosis: Secondary | ICD-10-CM | POA: Diagnosis not present

## 2023-01-06 MED ORDER — AZITHROMYCIN 250 MG PO TABS: 250.0000 mg | ORAL_TABLET | Freq: Every day | ORAL | 0 refills | Status: DC

## 2023-01-06 MED ORDER — AMOXICILLIN-POT CLAVULANATE 875-125 MG PO TABS: 1.0000 | ORAL_TABLET | Freq: Two times a day (BID) | ORAL | 0 refills | Status: AC

## 2023-01-06 NOTE — Telephone Encounter (Signed)
**  Correction [facility] went to [facility]**

## 2023-01-06 NOTE — Telephone Encounter (Signed)
Location of hospitalization: [place] Reason for hospitalization: A-fib Date of discharge: 01/02/2023 Date of first communication with patient: today Person contacting patient: Me Current symptoms: SOB with cough and went to [facility] ER last night did not stayed the night. Do you understand why you were in the Hospital: Yes Questions regarding discharge instructions: None Where were you discharged to: Home Medications reviewed: Yes Allergies reviewed: Yes Dietary changes reviewed: Yes. Discussed low fat and low salt diet.  Referals reviewed: NA Activities of Daily Living: Able to with mild limitations Any transportation issues/concerns: None Any patient concerns: None Confirmed importance & date/time of Follow up appt: Yes Confirmed with patient if condition begins to worsen call. Pt was given the office number and encouraged to call back with questions or concerns: Yes

## 2023-01-06 NOTE — Discharge Instructions (Addendum)
New prescriptions were sent to your pharmacy.  Discontinue the amoxicillin.  This will be replaced by 2 new antibiotics.  Take these new antibiotics for the next 5 days.  Return to the emergency department for any worsening symptoms.

## 2023-01-08 DIAGNOSIS — J159 Unspecified bacterial pneumonia: Secondary | ICD-10-CM | POA: Diagnosis not present

## 2023-01-08 DIAGNOSIS — I509 Heart failure, unspecified: Secondary | ICD-10-CM | POA: Diagnosis not present

## 2023-01-08 DIAGNOSIS — G9341 Metabolic encephalopathy: Secondary | ICD-10-CM | POA: Diagnosis not present

## 2023-01-08 DIAGNOSIS — B9620 Unspecified Escherichia coli [E. coli] as the cause of diseases classified elsewhere: Secondary | ICD-10-CM | POA: Diagnosis not present

## 2023-01-08 DIAGNOSIS — Z6825 Body mass index (BMI) 25.0-25.9, adult: Secondary | ICD-10-CM | POA: Diagnosis not present

## 2023-01-08 DIAGNOSIS — Z79899 Other long term (current) drug therapy: Secondary | ICD-10-CM | POA: Diagnosis not present

## 2023-01-08 DIAGNOSIS — I10 Essential (primary) hypertension: Secondary | ICD-10-CM | POA: Diagnosis not present

## 2023-01-08 DIAGNOSIS — E538 Deficiency of other specified B group vitamins: Secondary | ICD-10-CM | POA: Diagnosis not present

## 2023-01-08 DIAGNOSIS — N390 Urinary tract infection, site not specified: Secondary | ICD-10-CM | POA: Diagnosis not present

## 2023-01-08 DIAGNOSIS — I4891 Unspecified atrial fibrillation: Secondary | ICD-10-CM | POA: Diagnosis not present

## 2023-01-19 ENCOUNTER — Inpatient Hospital Stay (HOSPITAL_BASED_OUTPATIENT_CLINIC_OR_DEPARTMENT_OTHER)
Admission: EM | Admit: 2023-01-19 | Discharge: 2023-01-21 | DRG: 690 | Disposition: A | Discharge status: Home Health | Payer: Medicare Other | Attending: Family Medicine | Admitting: Family Medicine

## 2023-01-19 ENCOUNTER — Ambulatory Visit
Admission: RE | Admit: 2023-01-19 | Discharge: 2023-01-19 | Disposition: A | Discharge status: Home / Self Care | Payer: Medicare Other | Source: Ambulatory Visit | Attending: Family Medicine | Admitting: Family Medicine

## 2023-01-19 ENCOUNTER — Emergency Department (HOSPITAL_BASED_OUTPATIENT_CLINIC_OR_DEPARTMENT_OTHER): Payer: Medicare Other

## 2023-01-19 ENCOUNTER — Other Ambulatory Visit: Payer: Self-pay | Admitting: Family Medicine

## 2023-01-19 ENCOUNTER — Emergency Department (HOSPITAL_BASED_OUTPATIENT_CLINIC_OR_DEPARTMENT_OTHER): Payer: Medicare Other | Admitting: Radiology

## 2023-01-19 ENCOUNTER — Other Ambulatory Visit: Payer: Self-pay

## 2023-01-19 ENCOUNTER — Encounter (HOSPITAL_BASED_OUTPATIENT_CLINIC_OR_DEPARTMENT_OTHER): Payer: Self-pay | Admitting: Emergency Medicine

## 2023-01-19 DIAGNOSIS — I129 Hypertensive chronic kidney disease with stage 1 through stage 4 chronic kidney disease, or unspecified chronic kidney disease: Secondary | ICD-10-CM | POA: Diagnosis present

## 2023-01-19 DIAGNOSIS — R109 Unspecified abdominal pain: Secondary | ICD-10-CM | POA: Diagnosis not present

## 2023-01-19 DIAGNOSIS — G9341 Metabolic encephalopathy: Secondary | ICD-10-CM | POA: Diagnosis not present

## 2023-01-19 DIAGNOSIS — E860 Dehydration: Secondary | ICD-10-CM | POA: Diagnosis present

## 2023-01-19 DIAGNOSIS — I509 Heart failure, unspecified: Secondary | ICD-10-CM | POA: Diagnosis not present

## 2023-01-19 DIAGNOSIS — Z882 Allergy status to sulfonamides status: Secondary | ICD-10-CM

## 2023-01-19 DIAGNOSIS — K5730 Diverticulosis of large intestine without perforation or abscess without bleeding: Secondary | ICD-10-CM | POA: Diagnosis present

## 2023-01-19 DIAGNOSIS — R7881 Bacteremia: Secondary | ICD-10-CM | POA: Diagnosis present

## 2023-01-19 DIAGNOSIS — Z881 Allergy status to other antibiotic agents status: Secondary | ICD-10-CM

## 2023-01-19 DIAGNOSIS — E871 Hypo-osmolality and hyponatremia: Secondary | ICD-10-CM | POA: Diagnosis present

## 2023-01-19 DIAGNOSIS — R410 Disorientation, unspecified: Secondary | ICD-10-CM | POA: Diagnosis not present

## 2023-01-19 DIAGNOSIS — Z87440 Personal history of urinary (tract) infections: Secondary | ICD-10-CM

## 2023-01-19 DIAGNOSIS — Z8701 Personal history of pneumonia (recurrent): Secondary | ICD-10-CM | POA: Diagnosis not present

## 2023-01-19 DIAGNOSIS — Z9049 Acquired absence of other specified parts of digestive tract: Secondary | ICD-10-CM

## 2023-01-19 DIAGNOSIS — Z79899 Other long term (current) drug therapy: Secondary | ICD-10-CM | POA: Diagnosis not present

## 2023-01-19 DIAGNOSIS — C679 Malignant neoplasm of bladder, unspecified: Secondary | ICD-10-CM | POA: Diagnosis present

## 2023-01-19 DIAGNOSIS — I480 Paroxysmal atrial fibrillation: Secondary | ICD-10-CM | POA: Diagnosis present

## 2023-01-19 DIAGNOSIS — I4891 Unspecified atrial fibrillation: Secondary | ICD-10-CM | POA: Diagnosis not present

## 2023-01-19 DIAGNOSIS — M810 Age-related osteoporosis without current pathological fracture: Secondary | ICD-10-CM | POA: Diagnosis present

## 2023-01-19 DIAGNOSIS — Z853 Personal history of malignant neoplasm of breast: Secondary | ICD-10-CM

## 2023-01-19 DIAGNOSIS — Z8551 Personal history of malignant neoplasm of bladder: Secondary | ICD-10-CM

## 2023-01-19 DIAGNOSIS — K449 Diaphragmatic hernia without obstruction or gangrene: Secondary | ICD-10-CM | POA: Diagnosis not present

## 2023-01-19 DIAGNOSIS — N179 Acute kidney failure, unspecified: Secondary | ICD-10-CM | POA: Diagnosis not present

## 2023-01-19 DIAGNOSIS — R0602 Shortness of breath: Secondary | ICD-10-CM | POA: Diagnosis not present

## 2023-01-19 DIAGNOSIS — R0600 Dyspnea, unspecified: Secondary | ICD-10-CM

## 2023-01-19 DIAGNOSIS — N1832 Chronic kidney disease, stage 3b: Secondary | ICD-10-CM | POA: Diagnosis present

## 2023-01-19 DIAGNOSIS — B9620 Unspecified Escherichia coli [E. coli] as the cause of diseases classified elsewhere: Secondary | ICD-10-CM | POA: Diagnosis present

## 2023-01-19 DIAGNOSIS — Z886 Allergy status to analgesic agent status: Secondary | ICD-10-CM

## 2023-01-19 DIAGNOSIS — Z91013 Allergy to seafood: Secondary | ICD-10-CM

## 2023-01-19 DIAGNOSIS — I6381 Other cerebral infarction due to occlusion or stenosis of small artery: Secondary | ICD-10-CM | POA: Diagnosis not present

## 2023-01-19 DIAGNOSIS — N1330 Unspecified hydronephrosis: Secondary | ICD-10-CM | POA: Diagnosis present

## 2023-01-19 DIAGNOSIS — J159 Unspecified bacterial pneumonia: Secondary | ICD-10-CM | POA: Diagnosis not present

## 2023-01-19 DIAGNOSIS — E8770 Fluid overload, unspecified: Secondary | ICD-10-CM | POA: Diagnosis present

## 2023-01-19 DIAGNOSIS — R531 Weakness: Secondary | ICD-10-CM | POA: Diagnosis not present

## 2023-01-19 DIAGNOSIS — N390 Urinary tract infection, site not specified: Secondary | ICD-10-CM | POA: Diagnosis not present

## 2023-01-19 DIAGNOSIS — E876 Hypokalemia: Secondary | ICD-10-CM | POA: Diagnosis present

## 2023-01-19 DIAGNOSIS — Z7901 Long term (current) use of anticoagulants: Secondary | ICD-10-CM

## 2023-01-19 DIAGNOSIS — R509 Fever, unspecified: Secondary | ICD-10-CM | POA: Diagnosis not present

## 2023-01-19 LAB — URINALYSIS, ROUTINE W REFLEX MICROSCOPIC
Bilirubin Urine: NEGATIVE
Glucose, UA: NEGATIVE mg/dL
Ketones, ur: NEGATIVE mg/dL
Nitrite: POSITIVE — AB
Protein, ur: 30 mg/dL — AB
Specific Gravity, Urine: 1.02 (ref 1.005–1.030)
Trans Epithel, UA: <1
WBC, UA: >50 WBC/hpf (ref 0–5)
pH: 5.5 (ref 5.0–8.0)

## 2023-01-19 LAB — COMPREHENSIVE METABOLIC PANEL
ALT: 8 U/L (ref 0–44)
AST: 16 U/L (ref 15–41)
Albumin: 4 g/dL (ref 3.5–5.0)
Alkaline Phosphatase: 86 U/L (ref 38–126)
Anion gap: 10 (ref 5–15)
BUN: 39 mg/dL — ABNORMAL HIGH (ref 8–23)
CO2: 28 mmol/L (ref 22–32)
Calcium: 9.7 mg/dL (ref 8.9–10.3)
Chloride: 94 mmol/L — ABNORMAL LOW (ref 98–111)
Creatinine, Ser: 1.31 mg/dL — ABNORMAL HIGH (ref 0.44–1.00)
GFR, Estimated: 39 mL/min — ABNORMAL LOW (ref >60)
Glucose, Bld: 109 mg/dL — ABNORMAL HIGH (ref 70–99)
Potassium: 3.5 mmol/L (ref 3.5–5.1)
Sodium: 132 mmol/L — ABNORMAL LOW (ref 135–145)
Total Bilirubin: 0.5 mg/dL (ref 0.3–1.2)
Total Protein: 6.9 g/dL (ref 6.5–8.1)

## 2023-01-19 LAB — CBC WITH DIFFERENTIAL/PLATELET
Abs Immature Granulocytes: 0.04 10*3/uL (ref 0.00–0.07)
Basophils Absolute: 0.1 10*3/uL (ref 0.0–0.1)
Basophils Relative: 0 %
Eosinophils Absolute: 0 10*3/uL (ref 0.0–0.5)
Eosinophils Relative: 0 %
HCT: 34.5 % — ABNORMAL LOW (ref 36.0–46.0)
Hemoglobin: 10.9 g/dL — ABNORMAL LOW (ref 12.0–15.0)
Immature Granulocytes: 0 %
Lymphocytes Relative: 11 %
Lymphs Abs: 1.6 10*3/uL (ref 0.7–4.0)
MCH: 26.7 pg (ref 26.0–34.0)
MCHC: 31.6 g/dL (ref 30.0–36.0)
MCV: 84.6 fL (ref 80.0–100.0)
Monocytes Absolute: 0.9 10*3/uL (ref 0.1–1.0)
Monocytes Relative: 6 %
Neutro Abs: 12.2 10*3/uL — ABNORMAL HIGH (ref 1.7–7.7)
Neutrophils Relative %: 83 %
Platelets: 319 10*3/uL (ref 150–400)
RBC: 4.08 MIL/uL (ref 3.87–5.11)
RDW: 15.8 % — ABNORMAL HIGH (ref 11.5–15.5)
WBC: 14.8 10*3/uL — ABNORMAL HIGH (ref 4.0–10.5)
nRBC: 0 % (ref 0.0–0.2)

## 2023-01-19 LAB — CBG MONITORING, ED: Glucose-Capillary: 116 mg/dL — ABNORMAL HIGH (ref 70–99)

## 2023-01-19 MED ORDER — SODIUM CHLORIDE 0.9 % IV SOLN
2.0000 g | Freq: Once | INTRAVENOUS | Status: AC
  Administered 2023-01-20: 2 g via INTRAVENOUS
  Filled 2023-01-19: qty 20

## 2023-01-19 NOTE — ED Provider Notes (Signed)
[place] EMERGENCY DEPARTMENT AT [facility] Provider Note   CSN: [id number] Arrival date & time: 01/19/23  1935     History  No chief complaint on file.   DORENE BRUNI is a 89 y.o. female who presents to the emergency department with a chief complaint of confusion lethargy.  History is given by the patient's daughter at bedside.  She reports a past medical history of bladder cancer.  She reports that earlier this month the patient was admitted for UTI bacteremia with E. coli.  She was treated with IV antibiotics.  This was just after getting infusions into her bladder for treatment of her bladder cancer.  Patient was supposed to get a third treatment when this all started.  After she was discharged she returned 2 days later with volume overload/CHF exacerbation and a pneumonia.  She was treated in the outpatient setting with diuresis and oral antibiotics.  Her daughter states that she has been not fully back to herself since all of this has occurred this month however was doing fairly well at home up until yesterday when she had increased lethargy.  She had difficulty sleeping with increased urinary frequency.  Today her daughter noticed that she was very lethargic and much more confused than normal.  She was also complaining of some left-sided flank pain.  [facility] where they did some blood work and a urinalysis that showed some blood in her urine.  Outside records also showed leukocytosis and a drop in her GFR.Marland Kitchen  Patient was brought in for further evaluation.   HPI     Home Medications Prior to Admission medications   Medication Sig Start Date End Date Taking? Authorizing Provider  apixaban (ELIQUIS) 2.5 MG TABS tablet Take 1 tablet (2.5 mg total) by mouth 2 (two) times daily. 12/10/22   [name], [name], MD  azithromycin (ZITHROMAX) 250 MG tablet Take 1 tablet (250 mg total) by mouth daily. Take first 2 tablets together, then 1 every day until  finished. 01/06/23   Gloris Manchester, MD  Cholecalciferol (VITAMIN D3) 10 MCG (400 UNIT) CAPS Take 400 Units by mouth in the morning, at noon, in the evening, and at bedtime.    [provider]  diltiazem (CARDIZEM CD) 120 MG 24 hr capsule Take 1 capsule (120 mg total) by mouth daily. 01/03/23   Rodolph Bong, MD  ferrous sulfate 325 (65 FE) MG tablet Take 1 tablet (325 mg total) by mouth every 3 (three) days. 01/02/23   Rodolph Bong, MD  ketoconazole (NIZORAL) 2 % cream Apply 1 Application topically at bedtime. Apply to toes    [provider]  memantine (NAMENDA) 10 MG tablet Take 10 mg by mouth 2 (two) times daily.    [provider]  metoprolol succinate (TOPROL-XL) 25 MG 24 hr tablet TAKE 1 TABLET(25 MG) BY MOUTH DAILY Patient taking differently: Take 25 mg by mouth daily. 12/10/22   [name], Manish J, MD  senna-docusate (SENOKOT-S) 8.6-50 MG tablet Take 1 tablet by mouth daily.    [provider]      Allergies    Aspirin, Doxycycline, Fish allergy, Nsaids, Shellfish-derived products, Aloe, and Sulfa antibiotics    Review of Systems   Review of Systems  Physical Exam Updated Vital Signs BP (!) 123/55   Pulse 89   Temp 97.9 F (36.6 C) (Oral)   Resp (!) 30   Wt 59 kg   SpO2 98%   BMI 23.79  kg/m  Physical Exam Vitals and nursing note reviewed.  Constitutional:      General: She is not in acute distress.    Appearance: She is well-developed. She is not diaphoretic.  HENT:     Head: Normocephalic and atraumatic.     Right Ear: External ear normal.     Left Ear: External ear normal.     Nose: Nose normal.     Mouth/Throat:     Mouth: Mucous membranes are moist.  Eyes:     General: No scleral icterus.    Conjunctiva/sclera: Conjunctivae normal.  Cardiovascular:     Rate and Rhythm: Normal rate and regular rhythm.     Heart sounds: Normal heart sounds. No murmur heard.    No friction rub. No gallop.  Pulmonary:     Effort:  Pulmonary effort is normal. No respiratory distress.     Breath sounds: Normal breath sounds.  Abdominal:     General: Bowel sounds are normal. There is no distension.     Palpations: Abdomen is soft. There is no mass.     Tenderness: There is no abdominal tenderness. There is no right CVA tenderness, left CVA tenderness or guarding.  Musculoskeletal:     Cervical back: Normal range of motion.     Right lower leg: No edema.     Left lower leg: No edema.  Skin:    General: Skin is warm and dry.  Neurological:     Mental Status: She is alert and oriented to person, place, and time.  Psychiatric:        Behavior: Behavior normal.     ED Results / Procedures / Treatments   Labs (all labs ordered are listed, but only abnormal results are displayed) Labs Reviewed  CBC WITH DIFFERENTIAL/PLATELET - Abnormal; Notable for the following components:      Result Value   WBC 14.8 (*)    Hemoglobin 10.9 (*)    HCT 34.5 (*)    RDW 15.8 (*)    Neutro Abs 12.2 (*)    All other components within normal limits  CBG MONITORING, ED - Abnormal; Notable for the following components:   Glucose-Capillary 116 (*)    All other components within normal limits  COMPREHENSIVE METABOLIC PANEL  URINALYSIS, ROUTINE W REFLEX MICROSCOPIC    EKG EKG Interpretation  Date/Time:  Monday January 19 2023 20:21:43 EDT Ventricular Rate:  88 PR Interval:    QRS Duration: 66 QT Interval:  344 QTC Calculation: 416 R Axis:   27 Text Interpretation: likely nsr but low t wave amplitude Septal infarct , age undetermined Abnormal ECG No significant change since last tracing Confirmed by Melene Plan [phone]) on 01/19/2023 8:36:47 PM  Radiology No results found.  Procedures Procedures    Medications Ordered in ED Medications - No data to display  ED Course/ Medical Decision Making/ A&P Clinical Course as of 01/19/23 2356  Mon Jan 19, 2023  2332 Nitrite(!): POSITIVE [AH]    Clinical Course User Index [AH]  Arthor Captain, PA-C                             Medical Decision Making Amount and/or Complexity of Data Reviewed Labs: ordered. Decision-making details documented in ED Course. Radiology: ordered.   89 year old female brought in for further evaluation of altered mental status and lethargy. The differential diagnosis for AMS is extensive and includes, but is not limited to: drug overdose - opioids,  alcohol, sedatives, antipsychotics, drug withdrawal, others; Metabolic: hypoxia, hypoglycemia, hyperglycemia, hypercalcemia, hypernatremia, hyponatremia, uremia, hepatic encephalopathy, hypothyroidism, hyperthyroidism, vitamin B12 or thiamine deficiency, carbon monoxide poisoning, Wilson's disease, Lactic acidosis, DKA/HHOS; Infectious: meningitis, encephalitis, bacteremia/sepsis, urinary tract infection, pneumonia, neurosyphilis; Structural: Space-occupying lesion, (brain tumor, subdural hematoma, hydrocephalus,); Vascular: stroke, subarachnoid hemorrhage, coronary ischemia, hypertensive encephalopathy, CNS vasculitis, thrombotic thrombocytopenic purpura, disseminated intravascular coagulation, hyperviscosity; Psychiatric: Schizophrenia, depression; Other: Seizure, hypothermia, heat stroke, ICU psychosis, dementia -"sundowning."  Ordered and reviewed labs that included CBC which shows leukocytosis, CMP with increased creatinine from previous 2 weeks ago and drop in patient's GFR significantly.  Urine is cath urine and shows positive infection.  This was sent for culture.  Review of culture and sensitivity showed greater than 100,000 colony-forming units of E. coli which was pansensitive at that time.  I have ordered 2 g of IV Rocephin.  I ordered CT head and reviewed this images which is negative.  I have also ordered CT renal stone study which is currently pending.  Patient may need admission for your recurrent urinary tract infection.  Case discussed with Dr. Pilar Plate at shift handoff who is taken the  patient in signout.         Final Clinical Impression(s) / ED Diagnoses Final diagnoses:  None    Rx / DC Orders ED Discharge Orders     None         Arthor Captain, PA-C 01/20/23 0000    Sabas Sous, MD 01/21/23 [phone]

## 2023-01-19 NOTE — ED Triage Notes (Signed)
Starting 2 weeks ago was in the hospital for bladder infection and early sepsis. Discharged 4/12. Returned to the ER 4/14 for fluid overload and PNA, discharged after oral abx.   Today feeling poorly again, weak, and has a low grade temperature of 99.51F. Family took her to PCP and had labs done.  She is currently receiving treatment for bladder cancer.   She would like to potentially use these labs from 3pm today rather than repeat labs here if possible. Family is concerned that she needs more.

## 2023-01-20 DIAGNOSIS — M810 Age-related osteoporosis without current pathological fracture: Secondary | ICD-10-CM | POA: Diagnosis present

## 2023-01-20 DIAGNOSIS — Z7901 Long term (current) use of anticoagulants: Secondary | ICD-10-CM | POA: Diagnosis not present

## 2023-01-20 DIAGNOSIS — N179 Acute kidney failure, unspecified: Secondary | ICD-10-CM | POA: Diagnosis present

## 2023-01-20 DIAGNOSIS — Z87440 Personal history of urinary (tract) infections: Secondary | ICD-10-CM | POA: Diagnosis not present

## 2023-01-20 DIAGNOSIS — Z79899 Other long term (current) drug therapy: Secondary | ICD-10-CM | POA: Diagnosis not present

## 2023-01-20 DIAGNOSIS — N390 Urinary tract infection, site not specified: Secondary | ICD-10-CM | POA: Diagnosis present

## 2023-01-20 DIAGNOSIS — Z881 Allergy status to other antibiotic agents status: Secondary | ICD-10-CM | POA: Diagnosis not present

## 2023-01-20 DIAGNOSIS — B9620 Unspecified Escherichia coli [E. coli] as the cause of diseases classified elsewhere: Secondary | ICD-10-CM | POA: Diagnosis present

## 2023-01-20 DIAGNOSIS — E876 Hypokalemia: Secondary | ICD-10-CM | POA: Diagnosis present

## 2023-01-20 DIAGNOSIS — Z9049 Acquired absence of other specified parts of digestive tract: Secondary | ICD-10-CM | POA: Diagnosis not present

## 2023-01-20 DIAGNOSIS — N1330 Unspecified hydronephrosis: Secondary | ICD-10-CM | POA: Diagnosis present

## 2023-01-20 DIAGNOSIS — E871 Hypo-osmolality and hyponatremia: Secondary | ICD-10-CM | POA: Diagnosis present

## 2023-01-20 DIAGNOSIS — R7881 Bacteremia: Secondary | ICD-10-CM | POA: Diagnosis present

## 2023-01-20 DIAGNOSIS — Z853 Personal history of malignant neoplasm of breast: Secondary | ICD-10-CM | POA: Diagnosis not present

## 2023-01-20 DIAGNOSIS — K5730 Diverticulosis of large intestine without perforation or abscess without bleeding: Secondary | ICD-10-CM | POA: Diagnosis present

## 2023-01-20 DIAGNOSIS — E860 Dehydration: Secondary | ICD-10-CM | POA: Diagnosis present

## 2023-01-20 DIAGNOSIS — Z91013 Allergy to seafood: Secondary | ICD-10-CM | POA: Diagnosis not present

## 2023-01-20 DIAGNOSIS — I480 Paroxysmal atrial fibrillation: Secondary | ICD-10-CM | POA: Diagnosis present

## 2023-01-20 DIAGNOSIS — I129 Hypertensive chronic kidney disease with stage 1 through stage 4 chronic kidney disease, or unspecified chronic kidney disease: Secondary | ICD-10-CM | POA: Diagnosis present

## 2023-01-20 DIAGNOSIS — Z886 Allergy status to analgesic agent status: Secondary | ICD-10-CM | POA: Diagnosis not present

## 2023-01-20 DIAGNOSIS — N1832 Chronic kidney disease, stage 3b: Secondary | ICD-10-CM | POA: Diagnosis present

## 2023-01-20 DIAGNOSIS — Z8551 Personal history of malignant neoplasm of bladder: Secondary | ICD-10-CM | POA: Diagnosis not present

## 2023-01-20 DIAGNOSIS — E8770 Fluid overload, unspecified: Secondary | ICD-10-CM | POA: Diagnosis present

## 2023-01-20 DIAGNOSIS — C679 Malignant neoplasm of bladder, unspecified: Secondary | ICD-10-CM | POA: Diagnosis present

## 2023-01-20 HISTORY — DX: Urinary tract infection, site not specified: N39.0

## 2023-01-20 LAB — CBC WITH DIFFERENTIAL/PLATELET
Abs Immature Granulocytes: 0.18 10*3/uL — ABNORMAL HIGH (ref 0.00–0.07)
Basophils Absolute: 0.1 10*3/uL (ref 0.0–0.1)
Basophils Relative: 1 %
Eosinophils Absolute: 0.1 10*3/uL (ref 0.0–0.5)
Eosinophils Relative: 1 %
HCT: 33.8 % — ABNORMAL LOW (ref 36.0–46.0)
Hemoglobin: 10.4 g/dL — ABNORMAL LOW (ref 12.0–15.0)
Immature Granulocytes: 2 %
Lymphocytes Relative: 13 %
Lymphs Abs: 1.5 10*3/uL (ref 0.7–4.0)
MCH: 26.3 pg (ref 26.0–34.0)
MCHC: 30.8 g/dL (ref 30.0–36.0)
MCV: 85.6 fL (ref 80.0–100.0)
Monocytes Absolute: 0.6 10*3/uL (ref 0.1–1.0)
Monocytes Relative: 5 %
Neutro Abs: 8.6 10*3/uL — ABNORMAL HIGH (ref 1.7–7.7)
Neutrophils Relative %: 78 %
Platelets: 294 10*3/uL (ref 150–400)
RBC: 3.95 MIL/uL (ref 3.87–5.11)
RDW: 15.8 % — ABNORMAL HIGH (ref 11.5–15.5)
WBC: 10.9 10*3/uL — ABNORMAL HIGH (ref 4.0–10.5)
nRBC: 0 % (ref 0.0–0.2)

## 2023-01-20 LAB — COMPREHENSIVE METABOLIC PANEL
ALT: 11 U/L (ref 0–44)
AST: 19 U/L (ref 15–41)
Albumin: 3.6 g/dL (ref 3.5–5.0)
Alkaline Phosphatase: 81 U/L (ref 38–126)
Anion gap: 11 (ref 5–15)
BUN: 35 mg/dL — ABNORMAL HIGH (ref 8–23)
CO2: 26 mmol/L (ref 22–32)
Calcium: 9.2 mg/dL (ref 8.9–10.3)
Chloride: 96 mmol/L — ABNORMAL LOW (ref 98–111)
Creatinine, Ser: 1.18 mg/dL — ABNORMAL HIGH (ref 0.44–1.00)
GFR, Estimated: 44 mL/min — ABNORMAL LOW (ref >60)
Glucose, Bld: 104 mg/dL — ABNORMAL HIGH (ref 70–99)
Potassium: 3.1 mmol/L — ABNORMAL LOW (ref 3.5–5.1)
Sodium: 133 mmol/L — ABNORMAL LOW (ref 135–145)
Total Bilirubin: 0.7 mg/dL (ref 0.3–1.2)
Total Protein: 6.8 g/dL (ref 6.5–8.1)

## 2023-01-20 LAB — MAGNESIUM: Magnesium: 2.4 mg/dL (ref 1.7–2.4)

## 2023-01-20 LAB — C-REACTIVE PROTEIN: CRP: 13.2 mg/dL — ABNORMAL HIGH (ref <1.0)

## 2023-01-20 MED ORDER — SODIUM CHLORIDE 0.9% FLUSH
3.0000 mL | Freq: Two times a day (BID) | INTRAVENOUS | Status: DC
  Administered 2023-01-21: 3 mL via INTRAVENOUS

## 2023-01-20 MED ORDER — ACETAMINOPHEN 325 MG PO TABS
650.0000 mg | ORAL_TABLET | Freq: Four times a day (QID) | ORAL | Status: DC | PRN
  Administered 2023-01-20: 650 mg via ORAL
  Filled 2023-01-20: qty 2

## 2023-01-20 MED ORDER — APIXABAN 2.5 MG PO TABS
2.5000 mg | ORAL_TABLET | Freq: Two times a day (BID) | ORAL | Status: DC
  Administered 2023-01-20 – 2023-01-21 (×3): 2.5 mg via ORAL
  Filled 2023-01-20 (×3): qty 1

## 2023-01-20 MED ORDER — SODIUM CHLORIDE 0.9 % IV SOLN
2.0000 g | INTRAVENOUS | Status: DC
  Administered 2023-01-21: 2 g via INTRAVENOUS
  Filled 2023-01-20: qty 20

## 2023-01-20 MED ORDER — METOPROLOL TARTRATE 25 MG PO TABS
12.5000 mg | ORAL_TABLET | Freq: Two times a day (BID) | ORAL | Status: DC
  Administered 2023-01-20 – 2023-01-21 (×3): 12.5 mg via ORAL
  Filled 2023-01-20 (×3): qty 1

## 2023-01-20 MED ORDER — DILTIAZEM HCL 30 MG PO TABS
30.0000 mg | ORAL_TABLET | Freq: Three times a day (TID) | ORAL | Status: DC
  Administered 2023-01-20 – 2023-01-21 (×4): 30 mg via ORAL
  Filled 2023-01-20 (×5): qty 1

## 2023-01-20 MED ORDER — ACETAMINOPHEN 650 MG RE SUPP: 650.0000 mg | Freq: Four times a day (QID) | RECTAL | Status: DC | PRN

## 2023-01-20 MED ORDER — POTASSIUM CHLORIDE CRYS ER 20 MEQ PO TBCR
20.0000 meq | EXTENDED_RELEASE_TABLET | Freq: Two times a day (BID) | ORAL | Status: AC
  Administered 2023-01-20 (×2): 20 meq via ORAL
  Filled 2023-01-20 (×2): qty 1

## 2023-01-20 MED ORDER — ONDANSETRON HCL 4 MG PO TABS: 4.0000 mg | ORAL_TABLET | Freq: Four times a day (QID) | ORAL | Status: DC | PRN

## 2023-01-20 MED ORDER — SODIUM CHLORIDE 0.9 % IV SOLN: INTRAVENOUS | Status: DC

## 2023-01-20 MED ORDER — ONDANSETRON HCL 4 MG/2ML IJ SOLN: 4.0000 mg | Freq: Four times a day (QID) | INTRAMUSCULAR | Status: DC | PRN

## 2023-01-20 NOTE — ED Provider Notes (Signed)
  Provider Note MRN:  [id number]  Arrival date & time: 01/21/23    ED Course and Medical Decision Making  Assumed care from PA Osyka at shift change.  Recurrent UTI with some increased confusion today.  History of bladder cancer, receives occasional infusions of immunotherapy for this, which she has had recently.  Awaiting CT renal study to evaluate for complicating features of this UTI given the recurrence.  Will consider admission.  Procedures  Final Clinical Impressions(s) / ED Diagnoses     ICD-10-CM   1. Urinary tract infection with hematuria, site unspecified  N39.0    R31.9     2. AKI (acute kidney injury) (HCC)  N17.9     3. Hydronephrosis, unspecified hydronephrosis type  N13.30     4. Acute metabolic encephalopathy  G93.41       ED Discharge Orders     None       Discharge Instructions   None     Elmer Sow. Pilar Plate, MD [facility] Emergency Medicine [facility] [email or web address]    Sabas Sous, MD 01/21/23 [phone]

## 2023-01-20 NOTE — TOC CM/SW Note (Signed)
  Transition of Care [facility]) Screening Note   Patient Details  Name: Gina Mcgee Date of Birth: [birth year]   Transition of Care [facility]) CM/SW Contact:    Howell Rucks, RN Phone Number: 01/20/2023, 9:30 AM    Transition of Care Department [facility]) has reviewed patient and no TOC needs have been identified at this time. We will continue to monitor patient advancement through interdisciplinary progression rounds. If new patient transition needs arise, please place a TOC consult.

## 2023-01-20 NOTE — Progress Notes (Signed)
Mobility Specialist - Progress Note   01/20/23 1252  Mobility  Activity Transferred from bed to chair  Level of Assistance Minimal assist, patient does 75% or more  Assistive Device Front wheel walker  Distance Ambulated (ft) 5 ft  Range of Motion/Exercises Active  Activity Response Tolerated fair  Mobility Referral Yes  $Mobility charge 1 Mobility   Pt received in bed and agreed to mobility. Was standby for bed mobility. Min A for sit to stand. Pt upon standing felt that she was going to fall towards her left side. Pt took small shuffled steps towards recliner and sat down.  Pt left in recliner with all needs met and alarm on.   Marilynne Halsted Mobility Specialist

## 2023-01-20 NOTE — Evaluation (Signed)
Physical Therapy Evaluation Patient Details Name: Gina Mcgee MRN: [id number] DOB: [birth year] Today's Date: 01/20/2023  History of Present Illness  89 y.o. female who discharged from the hospital on 4/12 after an admission for acute metabolic encephalopathy in the setting of E. coli UTI and bacteremia, now admitted for weakness, fever, recurrent UTI. cxr negative.  medical history significant of hypertension, atrial fibrillation on NOAC, recent pansensitive E. coli UTI and bacteremia, anemia, remote history of bladder cancer.  Clinical Impression  Pt admitted with above diagnosis.  Pt and dtr report she is much weaker than her baseline, fatigues very rapidly at time of PT eval. Able to amb short distance, needing recliner pulled to her for seated rest, VSS. Will likely benefit from continued PT at home  Pt currently with functional limitations due to the deficits listed below (see PT Problem List). Pt will benefit from acute skilled PT to increase their independence and safety with mobility to allow discharge.          Recommendations for follow up therapy are one component of a multi-disciplinary discharge planning process, led by the attending physician.  Recommendations may be updated based on patient status, additional functional criteria and insurance authorization.  Follow Up Recommendations       Assistance Recommended at Discharge Frequent or constant Supervision/Assistance  Patient can return home with the following  A lot of help with walking and/or transfers;A lot of help with bathing/dressing/bathroom;Assistance with cooking/housework;Direct supervision/assist for medications management;Direct supervision/assist for financial management;Assist for transportation;Help with stairs or ramp for entrance    Equipment Recommendations None recommended by PT  Recommendations for Other Services       Functional Status Assessment Patient has had a recent decline in their functional  status and demonstrates the ability to make significant improvements in function in a reasonable and predictable amount of time.     Precautions / Restrictions Precautions Precautions: Fall Restrictions Weight Bearing Restrictions: No Other Position/Activity Restrictions: L wrist impairment PTA, no intrinsic hand strength      Mobility  Bed Mobility Overal bed mobility: Needs Assistance Bed Mobility: Supine to Sit, Sit to Supine     Supine to sit: Min assist Sit to supine: Min assist   General bed mobility comments: assist to elevate trunk and control descent    Transfers Overall transfer level: Needs assistance Equipment used: Rolling walker (2 wheels) Transfers: Sit to/from Stand Sit to Stand: Min assist, Mod assist           General transfer comment: assist to rise and control descent    Ambulation/Gait Ambulation/Gait assistance: Min assist, Mod assist Gait Distance (Feet): 15 Feet Assistive device: Rolling walker (2 wheels) Gait Pattern/deviations: Shuffle, Trunk flexed, Decreased stride length, Step-through pattern, Narrow base of support, Staggering right, Drifts right/left       General Gait Details: assist to balance and maneuver RW; fatigued rapidly requiring seated rest; SpO2=97%, HR 97  Stairs            Wheelchair Mobility    Modified Rankin (Stroke Patients Only)       Balance Overall balance assessment: Needs assistance Sitting-balance support: Feet supported, No upper extremity supported Sitting balance-Leahy Scale: Fair     Standing balance support: During functional activity, Reliant on assistive device for balance, Bilateral upper extremity supported Standing balance-Leahy Scale: Poor Standing balance comment: reliant on UEs and external assist  Pertinent Vitals/Pain Pain Assessment Pain Assessment: No/denies pain    Home Living Family/patient expects to be discharged to:: Private  residence Living Arrangements: Children Available Help at Discharge: Family;Personal care attendant;Available 24 hours/day Type of Home: House Home Access: Level entry       Home Layout: One level Home Equipment: Rollator (4 wheels);Cane - single point;BSC/3in1;Shower seat;Grab bars - toilet;Grab bars - tub/shower;Hand held shower head;Wheelchair - manual Additional Comments: Has 24 hr assistance between [employer], personal care aide, & daughter.    Prior Function Prior Level of Function : Needs assist             Mobility Comments: Pt was able to ambulate short distances ("across the room") prior to recent admission with rollator. ADLs Comments: Assistance with dressing and iADLs from PCAs and daughters. PCA states that she can bath herself but needs assistance with getting in and out of shower and drying off.     Hand Dominance   Dominant Hand: Right    Extremity/Trunk Assessment   Upper Extremity Assessment Upper Extremity Assessment: Defer to OT evaluation;Generalized weakness LUE Deficits / Details: Pt unable to use L hand due to prior nerve injury from a previous orthopedic surgery.    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    Cervical / Trunk Assessment Cervical / Trunk Assessment: Kyphotic  Communication   Communication: HOH  Cognition Arousal/Alertness: Awake/alert Behavior During Therapy: WFL for tasks assessed/performed Overall Cognitive Status: Within Functional Limits for tasks assessed                                          General Comments      Exercises     Assessment/Plan    PT Assessment Patient needs continued PT services  PT Problem List Decreased strength;Decreased range of motion;Decreased activity tolerance;Decreased balance;Decreased mobility;Decreased coordination;Decreased knowledge of use of DME;Decreased safety  awareness       PT Treatment Interventions DME instruction;Gait training;Stair training;Functional mobility training;Therapeutic activities;Therapeutic exercise;Balance training;Neuromuscular re-education;Patient/family education    PT Goals (Current goals can be found in the Care Plan section)  Acute Rehab PT Goals Patient Stated Goal: get stronger PT Goal Formulation: With patient/family Time For Goal Achievement: 02/03/23 Potential to Achieve Goals: Fair    Frequency Min 3X/week     Co-evaluation               AM-PAC PT "6 Clicks" Mobility  Outcome Measure Help needed turning from your back to your side while in a flat bed without using bedrails?: A Little Help needed moving from lying on your back to sitting on the side of a flat bed without using bedrails?: A Little Help needed moving to and from a bed to a chair (including a wheelchair)?: A Little Help needed standing up from a chair using your arms (e.g., wheelchair or bedside chair)?: A Little Help needed to walk in hospital room?: A Lot Help needed climbing 3-5 steps with a railing? : A Lot 6 Click Score: 16    End of Session Equipment Utilized During Treatment: Gait belt   Patient left: with call bell/phone within reach;in bed;with family/visitor present;with bed alarm set   PT Visit Diagnosis: Other abnormalities of gait and mobility (R26.89);Difficulty in walking, not elsewhere classified (R26.2);Muscle weakness (generalized) (M62.81)  Time: 4098-1191 PT Time Calculation (min) (ACUTE ONLY): 30 min   Charges:   PT Evaluation $PT Eval Low Complexity: 1 Low          Arshia Rondon, PT  Acute Rehab Dept ([facility]) [phone]  01/20/2023   [facility] 01/20/2023, 5:21 PM

## 2023-01-20 NOTE — Progress Notes (Signed)
Plan of Care Note for accepted transfer   Patient: Gina Mcgee MRN: [id number]   DOA: 01/19/2023  Facility requesting transfer: [facility] ED Requesting Provider: Dr. Pilar Plate, EDP Reason for transfer:  Complicated UTI  Facility course: The patient is a 89 yo F with PMH significant for bladder cancer, HTN, Paroxysmal Afib on Eliquis, GERD, Hx of Ecoli UTI who presented to [facility] ED with lethargy and AMS.  Associated with left flank pain.  Work up in the Ed revealed complicated UTI, UA +pyuria.  CT renal stone revealed the following: Fullness of the left collecting system left ureter to the level of the ureterovesical junction without stone. This may be related to the patient's known history of bladder carcinoma although no discrete mass is seen.   Diverticulosis without diverticulitis.   Chronic compression deformities in the thoracolumbar spine.  The patient was started on Rocephin empirically.  Admitted at [facility] Progressive unit as inpatient status.  Plan of care: The patient is accepted for admission to Progressive unit, at [facility].  Author: Darlin Drop, DO 01/20/2023  Check [email or web address] for on-call coverage.  Nursing staff, Please call [facility] Admits & Consults System-Wide number on Amion as soon as patient's arrival, so appropriate admitting provider can evaluate the pt.

## 2023-01-20 NOTE — H&P (Signed)
History and Physical    Patient: Gina Mcgee ZOX:[id number] DOB: 07/17/1930 DOA: 01/19/2023 DOS: the patient was seen and examined on 01/20/2023 PCP: Gina Dimitri, MD  Patient coming from: Home  Chief Complaint: Generalized weakness and fever  HPI: Gina Mcgee is a 89 y.o. female with medical history significant of hypertension, atrial fibrillation on NOAC, recent pansensitive E. coli UTI and bacteremia, anemia, remote history of bladder cancer.  Patient was discharged from the hospital on 4/12 after an admission for acute metabolic encephalopathy in the setting of E. coli UTI and bacteremia.  Patient was discharged on amoxicillin after completing 3 days of IV Rocephin.  Patient presented to the ER once again on the 15th and was found to have pneumonia and some slight volume overload.  She was given Lasix and her antibiotics were changed to Augmentin and azithromycin.  Patient has presented back to the hospital.  On 4/29 with complaints of generalized weakness and fevers.  Upon initial presentation to triage patient was afebrile and was normotensive with room air O2 saturations of 98%.  She was slightly tachypneic.  Labs revealed hyponatremia sodium 132 with a BUN of 39 and a creatinine of 1.31, white count 14,800 with left shift, highly abnormal urinalysis with hazy appearance, moderate hemoglobin, large leukocytes, positive nitrite and 30 of protein, many bacteria, WBC clumps present with greater than 50 WBCs.  Patient was empirically treated with IV Rocephin by the EDP.  Because of the amount of hemoglobin count in the urinalysis CT renal stone study was completed.  There were no obstructing issues such as a stone but there was fullness of the left collecting system of the left ureter to the level of the ureterovesical juncture which may be related to her history of bladder carcinoma.  No discrete mass was seen.  Hospitalist service was asked to evaluate the patient for admission.  Upon my  evaluation of the patient she was alert and sitting up in the bed eating breakfast.  She had no complaints of pain.  She has not had any nausea vomiting or diarrhea.  Family member at bedside states that she has not had any urine output since arrival.  Review of Systems: As mentioned in the history of present illness. All other systems reviewed and are negative. Past Medical History:  Diagnosis Date   A-fib [facility])    Brain concussion 09/22/1958   Cancer [facility])    left-sided breast cancer   Concussion 09/22/1958   with left-sided neuro defecits   HTN (hypertension)    Osteoporosis    Past Surgical History:  Procedure Laterality Date   BOWEL RESECTION  approx. in 2007   polyp and formed stricture   BREAST LUMPECTOMY  1981   left lumpectomy w/ radiation   COLONOSCOPY W/ POLYPECTOMY     FEMUR SURGERY  2011   left femur pinning   JOINT REPLACEMENT     ORIF HUMERUS FRACTURE Left 01/25/2013   Procedure: OPEN REDUCTION INTERNAL FIXATION (ORIF) DISTAL HUMERUS FRACTURE REPAIR RECONSTRUCTION AND ULNA NERVE DECOMPRESSION AND ANTERIOR TRANSPOSITION AS NECESSARY;  Surgeon: Dominica Severin, MD;  Location: [facility] OR;  Service: Orthopedics;  Laterality: Left;   Social History:  reports that she has never smoked. She has never used smokeless tobacco. She reports that she does not currently use alcohol. She reports that she does not use drugs.  Allergies  Allergen Reactions   Aspirin Other (See Comments)    Gets jitter with large doses   Doxycycline Diarrhea  Fish Allergy Nausea Only and Other (See Comments)    "makes me jumpy and twitchy, but grand children are allergic"    Nsaids     Can take for short periods of time. Naprosyn -feels dazed   Shellfish-Derived Products Other (See Comments)    "makes me jumpy and twitchy, but grand children are allergic"   Aloe Rash   Sulfa Antibiotics Rash    Family History  Problem Relation Age of Onset   Stroke Mother    Lung cancer Father    Stroke Father      Prior to Admission medications   Medication Sig Start Date End Date Taking? Authorizing Provider  apixaban (ELIQUIS) 2.5 MG TABS tablet Take 1 tablet (2.5 mg total) by mouth 2 (two) times daily. 12/10/22   [name], [name], MD  azithromycin (ZITHROMAX) 250 MG tablet Take 1 tablet (250 mg total) by mouth daily. Take first 2 tablets together, then 1 every day until finished. 01/06/23   Gloris Manchester, MD  Cholecalciferol (VITAMIN D3) 10 MCG (400 UNIT) CAPS Take 400 Units by mouth in the morning, at noon, in the evening, and at bedtime.    [provider]  diltiazem (CARDIZEM CD) 120 MG 24 hr capsule Take 1 capsule (120 mg total) by mouth daily. 01/03/23   Rodolph Bong, MD  ferrous sulfate 325 (65 FE) MG tablet Take 1 tablet (325 mg total) by mouth every 3 (three) days. 01/02/23   Rodolph Bong, MD  ketoconazole (NIZORAL) 2 % cream Apply 1 Application topically at bedtime. Apply to toes    [provider]  memantine (NAMENDA) 10 MG tablet Take 10 mg by mouth 2 (two) times daily.    [provider]  metoprolol succinate (TOPROL-XL) 25 MG 24 hr tablet TAKE 1 TABLET(25 MG) BY MOUTH DAILY Patient taking differently: Take 25 mg by mouth daily. 12/10/22   [name], Manish J, MD  senna-docusate (SENOKOT-S) 8.6-50 MG tablet Take 1 tablet by mouth daily.    [provider]    Physical Exam: Vitals:   01/20/23 0258 01/20/23 0410 01/20/23 0841 01/20/23 0957  BP: 110/64 129/60 (!) 108/56 105/61  Pulse: 85 85 93 93  Resp: (!) 21 18 18    Temp: 98 F (36.7 C) 98 F (36.7 C) 98.7 F (37.1 C)   TempSrc:  Oral Oral   SpO2: 97% 97% 96%   Weight:  54.7 kg    Height:  5\' 2"  (1.575 m)     Constitutional: NAD, calm, comfortable Respiratory: clear to auscultation bilaterally, no wheezing, no crackles. Normal respiratory effort. No accessory muscle use.  Cardiovascular: Regular rate and rhythm, no murmurs / rubs / gallops. No extremity edema. 2+ pedal pulses.   Abdomen: no tenderness, no masses palpated.  No suprapubic fullness or tenderness.  No hepatosplenomegaly. Bowel sounds positive.  Musculoskeletal: no clubbing / cyanosis. No joint deformity upper and lower extremities. Good ROM, no contractures. Normal muscle tone.  Skin: no rashes, lesions, ulcers. No induration Neurologic: CN 2-12 grossly intact. Sensation intact, Strength 5/5 x all 4 extremities.  Psychiatric: Normal judgment and insight. Alert and oriented x 3. Normal mood.     Data Reviewed:  Initial labs as above.  Follow-up labs from this morning: Sodium 133, potassium 3.1, BUN 35, creatinine 1.18, white count 10,900 with decreasing left shift, hemoglobin 10.4, platelets 294,000, blood culture and urine culture have been obtained and are pending.  Chest x-ray shows no acute pulmonary process  Assessment and Plan: Recurrent  UTI/recent pansensitive E. coli UTI and bacteremia Continue empiric IV Rocephin Patient was discharged on amoxicillin and then return to the ED on 4/15 with pulmonary symptoms and antibiotics were switched to Augmentin and Zithromax-this change in antibiotics could have led to inadequate treatment Follow-up on urine culture and blood cultures Hemodynamically stable and no signs of sepsis physiology  Acute kidney injury Baseline creatinine 0.19 with initial creatinine 1.31 Creatinine trending downward with hydration Continue hydration and follow labs Avoid any potentially nephrotoxic medications  Acute hypokalemia Oral replacement and follow labs  History of atrial fibrillation Currently rate controlled Given dehydration and suboptimal blood pressure readings will give shorter acting formulations of patient's home calcium channel blockers and beta-blockers and will initiate at lower than baseline dose Continue telemetry monitoring Continue Eliquis  Hypertension BP currently suboptimal See dose adjustments as above for home medications  Physical  deconditioning Patient has had multiple physiologic insults beginning with admission for UTI and bacteremia beginning of April and development of pulmonary infection requiring antibiotic treatment mid April PT and OT evaluation    Advance Care Planning:   Code Status: Full Code   VTE prophylaxis: Eliquis  Consults: None  Family Communication: Daughter at bedside  Severity of Illness: The appropriate patient status for this patient is INPATIENT. Inpatient status is judged to be reasonable and necessary in order to provide the required intensity of service to ensure the patient's safety. The patient's presenting symptoms, physical exam findings, and initial radiographic and laboratory data in the context of their chronic comorbidities is felt to place them at high risk for further clinical deterioration. Furthermore, it is not anticipated that the patient will be medically stable for discharge from the hospital within 2 midnights of admission.   * I certify that at the point of admission it is my clinical judgment that the patient will require inpatient hospital care spanning beyond 2 midnights from the point of admission due to high intensity of service, high risk for further deterioration and high frequency of surveillance required.*  Author: Junious Silk, NP 01/20/2023 10:13 AM  For on call review [email or web address].

## 2023-01-20 NOTE — ED Notes (Incomplete)
ED TO INPATIENT HANDOFF REPORT  ED Nurse Name and Phone #: ***  S Name/Age/Gender Gina Mcgee 89 y.o. female Room/Bed: DB016/DB016  Code Status   Code Status: Prior  Home/SNF/Other {Discharge Destination:18313::"Home"} {Patient oriented to:22149:::1} Is this baseline? {YES/NO:21197}  Triage Complete: Triage complete  Chief Complaint UTI (urinary tract infection) [N39.0]  Triage Note Starting 2 weeks ago was in the hospital for bladder infection and early sepsis. Discharged 4/12. Returned to the ER 4/14 for fluid overload and PNA, discharged after oral abx.   Today feeling poorly again, weak, and has a low grade temperature of 99.78F. Family took her to PCP and had labs done.  She is currently receiving treatment for bladder cancer.   She would like to potentially use these labs from 3pm today rather than repeat labs here if possible. Family is concerned that she needs more.    Allergies Allergies  Allergen Reactions  . Aspirin Other (See Comments)    Gets jitter with large doses  . Doxycycline Diarrhea  . Fish Allergy Nausea Only and Other (See Comments)    "makes me jumpy and twitchy, but grand children are allergic"   . Nsaids     Can take for short periods of time. Naprosyn -feels dazed  . Shellfish-Derived Products Other (See Comments)    "makes me jumpy and twitchy, but grand children are allergic"  . Aloe Rash  . Sulfa Antibiotics Rash    Level of Care/Admitting Diagnosis ED Disposition     ED Disposition  Admit   Condition  --   Comment  Hospital [facility] [[id number]]  Level of Care: Progressive [102]  Admit to Progressive based on following criteria: NEPHROLOGY stable condition requiring close monitoring for AKI, requiring Hemodialysis or Peritoneal Dialysis either from expected electrolyte imbalance, acidosis, or fluid overload that can be managed by NIPPV or high flow oxygen.  May admit patient to [facility] or [facility] if equivalent level of care is available:: No  Interfacility transfer: Yes  Covid Evaluation: Asymptomatic - no recent exposure (last 10 days) testing not required  Diagnosis: UTI (urinary tract infection) [161096]  Admitting Physician: Darlin Drop [[id number]]  Attending Physician: Darlin Drop [[id number]]  Certification:: I certify this patient will need inpatient services for at least 2 midnights  Estimated Length of Stay: 2          B Medical/Surgery History Past Medical History:  Diagnosis Date  . A-fib (HCC)   . Brain concussion 09/22/1958  . Cancer [facility])    left-sided breast cancer  . Concussion 09/22/1958   with left-sided neuro defecits  . HTN (hypertension)   . Osteoporosis    Past Surgical History:  Procedure Laterality Date  . BOWEL RESECTION  approx. in 2007   polyp and formed stricture  . BREAST LUMPECTOMY  1981   left lumpectomy w/ radiation  . COLONOSCOPY W/ POLYPECTOMY    . FEMUR SURGERY  2011   left femur pinning  . JOINT REPLACEMENT    . ORIF HUMERUS FRACTURE Left 01/25/2013   Procedure: OPEN REDUCTION INTERNAL FIXATION (ORIF) DISTAL HUMERUS FRACTURE REPAIR RECONSTRUCTION AND ULNA NERVE DECOMPRESSION AND ANTERIOR TRANSPOSITION AS NECESSARY;  Surgeon: Dominica Severin, MD;  Location: [facility] OR;  Service: Orthopedics;  Laterality: Left;     A IV Location/Drains/Wounds Patient Lines/Drains/Airways Status     Active Line/Drains/Airways     Name Placement date Placement time Site Days   Peripheral IV 01/20/23 Anterior;Proximal;Right Forearm 01/20/23  0005  Forearm  less than 1            Intake/Output Last 24 hours No intake or output data in the 24 hours ending 01/20/23 0259  Labs/Imaging Results for orders placed or performed during the hospital encounter of 01/19/23 (from the past 48 hour(s))  CBG monitoring, ED     Status: Abnormal   Collection Time: 01/19/23  9:58 PM  Result Value Ref Range   Glucose-Capillary 116 (H) 70 - 99 mg/dL     Comment: Glucose reference range applies only to samples taken after fasting for at least 8 hours.  CBC with Differential     Status: Abnormal   Collection Time: 01/19/23 10:03 PM  Result Value Ref Range   WBC 14.8 (H) 4.0 - 10.5 K/uL   RBC 4.08 3.87 - 5.11 MIL/uL   Hemoglobin 10.9 (L) 12.0 - 15.0 g/dL   HCT 40.9 (L) 81.1 - 91.4 %   MCV 84.6 80.0 - 100.0 fL   MCH 26.7 26.0 - 34.0 pg   MCHC 31.6 30.0 - 36.0 g/dL   RDW 78.2 (H) 95.6 - 21.3 %   Platelets 319 150 - 400 K/uL   nRBC 0.0 0.0 - 0.2 %   Neutrophils Relative % 83 %   Neutro Abs 12.2 (H) 1.7 - 7.7 K/uL   Lymphocytes Relative 11 %   Lymphs Abs 1.6 0.7 - 4.0 K/uL   Monocytes Relative 6 %   Monocytes Absolute 0.9 0.1 - 1.0 K/uL   Eosinophils Relative 0 %   Eosinophils Absolute 0.0 0.0 - 0.5 K/uL   Basophils Relative 0 %   Basophils Absolute 0.1 0.0 - 0.1 K/uL   Immature Granulocytes 0 %   Abs Immature Granulocytes 0.04 0.00 - 0.07 K/uL    Comment: Performed at [facility], [address], [place], [place] [address]  Comprehensive metabolic panel     Status: Abnormal   Collection Time: 01/19/23 10:03 PM  Result Value Ref Range   Sodium 132 (L) 135 - 145 mmol/L   Potassium 3.5 3.5 - 5.1 mmol/L   Chloride 94 (L) 98 - 111 mmol/L   CO2 28 22 - 32 mmol/L   Glucose, Bld 109 (H) 70 - 99 mg/dL    Comment: Glucose reference range applies only to samples taken after fasting for at least 8 hours.   BUN 39 (H) 8 - 23 mg/dL   Creatinine, Ser 8.46 (H) 0.44 - 1.00 mg/dL   Calcium 9.7 8.9 - 96.2 mg/dL   Total Protein 6.9 6.5 - 8.1 g/dL   Albumin 4.0 3.5 - 5.0 g/dL   AST 16 15 - 41 U/L   ALT 8 0 - 44 U/L   Alkaline Phosphatase 86 38 - 126 U/L   Total Bilirubin 0.5 0.3 - 1.2 mg/dL   GFR, Estimated 39 (L) >60 mL/min    Comment: (NOTE) Calculated using the CKD-EPI Creatinine Equation (2021)    Anion gap 10 5 - 15    Comment: Performed at [facility], [address] [place], [place] [address]   Urinalysis, Routine w reflex microscopic -Urine, Catheterized     Status: Abnormal   Collection Time: 01/19/23 10:03 PM  Result Value Ref Range   Color, Urine YELLOW YELLOW   APPearance HAZY (A) CLEAR   Specific Gravity, Urine 1.020 1.005 - 1.030   pH 5.5 5.0 - 8.0   Glucose, UA NEGATIVE NEGATIVE mg/dL   Hgb urine dipstick MODERATE (A) NEGATIVE   Bilirubin Urine  NEGATIVE NEGATIVE   Ketones, ur NEGATIVE NEGATIVE mg/dL   Protein, ur 30 (A) NEGATIVE mg/dL   Nitrite POSITIVE (A) NEGATIVE   Leukocytes,Ua LARGE (A) NEGATIVE   RBC / HPF 6-10 0 - 5 RBC/hpf   WBC, UA >50 0 - 5 WBC/hpf   Bacteria, UA MANY (A) NONE SEEN   Squamous Epithelial / HPF 0-5 0 - 5 /HPF   Trans Epithel, UA <1    WBC Clumps PRESENT    Mucus PRESENT     Comment: Performed at [facility], [address], [place], [place] [address]   CT Renal Stone Study  Result Date: 01/20/2023 CLINICAL DATA:  Flank pain EXAM: CT ABDOMEN AND PELVIS WITHOUT CONTRAST TECHNIQUE: Multidetector CT imaging of the abdomen and pelvis was performed following the standard protocol without IV contrast. RADIATION DOSE REDUCTION: This exam was performed according to the departmental dose-optimization program which includes automated exposure control, adjustment of the mA and/or kV according to patient size and/or use of iterative reconstruction technique. COMPARISON:  01/06/2023 FINDINGS: Lower chest: Minimal effusion is noted on the left. Calcified granuloma is noted in the right middle lobe. Hepatobiliary: No focal liver abnormality is seen. No gallstones, gallbladder wall thickening, or biliary dilatation. Pancreas: Unremarkable. No pancreatic ductal dilatation or surrounding inflammatory changes. Spleen: Normal in size without focal abnormality. Adrenals/Urinary Tract: Adrenal glands are within normal limits. Increasing fullness of the left collecting system is noted. The ureter is dilated without evidence of ureteral stone. Bladder  is partially distended with unopacified urine. Stomach/Bowel: Scattered diverticular change of the colon is noted. No evidence of diverticulitis is seen. Mild retained fecal material is noted. The appendix is within normal limits. Small bowel shows no acute abnormality. Stomach shows a moderate to large hiatal hernia. Vascular/Lymphatic: Aortic atherosclerosis. No enlarged abdominal or pelvic lymph nodes. Reproductive: Uterus and bilateral adnexa are unremarkable. Other: No abdominal wall hernia or abnormality. No abdominopelvic ascites. Musculoskeletal: Left femoral medullary rod is noted. No acute bony abnormality is seen. No rib abnormality is noted. Multiple chronic compression deformities are noted in the visualized thoracolumbar spine. These are stable in appearance from the prior exam. IMPRESSION: Fullness of the left collecting system left ureter to the level of the ureterovesical junction without stone. This may be related to the patient's known history of bladder carcinoma although no discrete mass is seen. Diverticulosis without diverticulitis. Chronic compression deformities in the thoracolumbar spine. Electronically Signed   By: Alcide Clever M.D.   On: 01/20/2023 00:05   DG Chest 2 View  Result Date: 01/19/2023 CLINICAL DATA:  Shortness of breath, with prior history of pneumonia and fluid overload. EXAM: CHEST - 2 VIEW COMPARISON:  CTA chest 01/05/2023 FINDINGS: The heart is slightly enlarged. Interstitial edema and perihilar vascular congestion noted previously have resolved. There are chronic interstitial changes in the left mid lung without evidence of focal acute process. Additional chronic fibrosis left apex. No pleural effusion is seen. There is a moderate-sized hiatal hernia. Tortuosity of the aorta with scattered calcification. There is osteopenia with chronic compression fractures in the lower thoracic spine with lower thoracic kyphosis. No new compression fracture is seen. IMPRESSION: 1.  No evidence of acute chest process. Chronic changes in the left mid lung and left apex. 2. Hiatal hernia. 3. Aortic atherosclerosis. 4. Osteopenia with lower thoracic chronic compression fractures and kyphosis. Electronically Signed   By: Almira Bar M.D.   On: 01/19/2023 23:07   CT Head Wo Contrast  Result Date: 01/19/2023 CLINICAL  DATA:  Weakness and delirium with low-grade fever. EXAM: CT HEAD WITHOUT CONTRAST TECHNIQUE: Contiguous axial images were obtained from the base of the skull through the vertex without intravenous contrast. RADIATION DOSE REDUCTION: This exam was performed according to the departmental dose-optimization program which includes automated exposure control, adjustment of the mA and/or kV according to patient size and/or use of iterative reconstruction technique. COMPARISON:  Head CT 11/16/2019 FINDINGS: Brain: There is moderately advanced atrophy with advanced chronic small-vessel disease of the cerebral white matter with atrophic ventriculomegaly versus normal pressure hydrocephalus. The ventricles are stable in size and configuration with no midline shift. There is relatively mild cerebellar atrophy. No new asymmetry is seen worrisome for an acute infarct, hemorrhage or mass. A few tiny chronic bilateral gangliocapsular lacunar infarcts are again shown. The basal cisterns are clear. There is senescent mineralization in the basal ganglia. Vascular: There are patchy calcifications in the carotid siphons. No hyperdense central vessels. Skull: There is osteopenia without evidence of fractures or focal bone lesions. Sinuses/Orbits: No acute finding. Other: Visualized mastoid air cells are clear with bilateral petrous apex pneumatization. IMPRESSION: 1. No acute intracranial CT findings or interval changes. 2. Moderate atrophy and advanced chronic small-vessel disease with atrophic ventriculomegaly versus normal pressure hydrocephalus. 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 3. Osteopenia.  Electronically Signed   By: Almira Bar M.D.   On: 01/19/2023 22:52    Pending Labs Unresulted Labs (From admission, onward)    None       Vitals/Pain Today's Vitals   01/20/23 0130 01/20/23 0238 01/20/23 0244 01/20/23 0258  BP: 123/70   110/64  Pulse: 86 92 85 85  Resp: (!) 21   (!) 21  Temp:    98 F (36.7 C)  TempSrc:      SpO2: 95% (!) 85% 94% 97%  Weight:        Isolation Precautions No active isolations  Medications Medications  cefTRIAXone (ROCEPHIN) 2 g in sodium chloride 0.9 % 100 mL IVPB (0 g Intravenous Stopped 01/20/23 0046)    Mobility manual wheelchair     Focused Assessments   Swallow screen pass? Yes       If patient is a Neuro Trauma and patient is going to OR before floor call report to 4N Charge nurse: [phone] or [phone]   R Recommendations: See Admitting Provider Note  Report given to:   Additional Notes: ***

## 2023-01-20 NOTE — Plan of Care (Signed)
  Problem: Health Behavior/Discharge Planning: Goal: Ability to manage health-related needs will improve Outcome: Progressing   Problem: Clinical Measurements: Goal: Ability to maintain clinical measurements within normal limits will improve Outcome: Progressing Goal: Respiratory complications will improve Outcome: Progressing   Problem: Nutrition: Goal: Adequate nutrition will be maintained Outcome: Progressing   Problem: Coping: Goal: Level of anxiety will decrease Outcome: Progressing   Problem: Elimination: Goal: Will not experience complications related to bowel motility Outcome: Progressing Goal: Will not experience complications related to urinary retention Outcome: Progressing   Problem: Safety: Goal: Ability to remain free from injury will improve Outcome: Progressing   Problem: Skin Integrity: Goal: Risk for impaired skin integrity will decrease Outcome: Progressing

## 2023-01-20 NOTE — ED Notes (Signed)
ED TO INPATIENT HANDOFF REPORT  ED Nurse Name and Phone #:   S Name/Age/Gender Landis Martins Babula 89 y.o. female Room/Bed: DB016/DB016  Code Status   Code Status: Prior  Home/SNF/Other Home Patient oriented to: self, time, place, situation Is this baseline? Yes   Triage Complete: Triage complete  Chief Complaint UTI (urinary tract infection) [N39.0]  Triage Note Starting 2 weeks ago was in the hospital for bladder infection and early sepsis. Discharged 4/12. Returned to the ER 4/14 for fluid overload and PNA, discharged after oral abx.   Today feeling poorly again, weak, and has a low grade temperature of 99.65F. Family took her to PCP and had labs done.  She is currently receiving treatment for bladder cancer.   She would like to potentially use these labs from 3pm today rather than repeat labs here if possible. Family is concerned that she needs more.    Allergies Allergies  Allergen Reactions   Aspirin Other (See Comments)    Gets jitter with large doses   Doxycycline Diarrhea   Fish Allergy Nausea Only and Other (See Comments)    "makes me jumpy and twitchy, but grand children are allergic"    Nsaids     Can take for short periods of time. Naprosyn -feels dazed   Shellfish-Derived Products Other (See Comments)    "makes me jumpy and twitchy, but grand children are allergic"   Aloe Rash   Sulfa Antibiotics Rash    Level of Care/Admitting Diagnosis ED Disposition     ED Disposition  Admit   Condition  --   Comment  Hospital Area: [facility] [[id number]]  Level of Care: Progressive [102]  Admit to Progressive based on following criteria: NEPHROLOGY stable condition requiring close monitoring for AKI, requiring Hemodialysis or Peritoneal Dialysis either from expected electrolyte imbalance, acidosis, or fluid overload that can be managed by NIPPV or high flow oxygen.  May admit patient to [facility] or [facility] if equivalent level of care is  available:: No  Interfacility transfer: Yes  Covid Evaluation: Asymptomatic - no recent exposure (last 10 days) testing not required  Diagnosis: UTI (urinary tract infection) [161096]  Admitting Physician: Darlin Drop [[id number]]  Attending Physician: Darlin Drop [[id number]]  Certification:: I certify this patient will need inpatient services for at least 2 midnights  Estimated Length of Stay: 2          B Medical/Surgery History Past Medical History:  Diagnosis Date   A-fib (HCC)    Brain concussion 09/22/1958   Cancer [facility])    left-sided breast cancer   Concussion 09/22/1958   with left-sided neuro defecits   HTN (hypertension)    Osteoporosis    Past Surgical History:  Procedure Laterality Date   BOWEL RESECTION  approx. in 2007   polyp and formed stricture   BREAST LUMPECTOMY  1981   left lumpectomy w/ radiation   COLONOSCOPY W/ POLYPECTOMY     FEMUR SURGERY  2011   left femur pinning   JOINT REPLACEMENT     ORIF HUMERUS FRACTURE Left 01/25/2013   Procedure: OPEN REDUCTION INTERNAL FIXATION (ORIF) DISTAL HUMERUS FRACTURE REPAIR RECONSTRUCTION AND ULNA NERVE DECOMPRESSION AND ANTERIOR TRANSPOSITION AS NECESSARY;  Surgeon: Dominica Severin, MD;  Location: [facility] OR;  Service: Orthopedics;  Laterality: Left;     A IV Location/Drains/Wounds Patient Lines/Drains/Airways Status     Active Line/Drains/Airways     Name Placement date Placement time Site Days   Peripheral IV 01/20/23 Anterior;Proximal;Right  Forearm 01/20/23  0005  Forearm  less than 1            Intake/Output Last 24 hours No intake or output data in the 24 hours ending 01/20/23 1610  Labs/Imaging Results for orders placed or performed during the hospital encounter of 01/19/23 (from the past 48 hour(s))  CBG monitoring, ED     Status: Abnormal   Collection Time: 01/19/23  9:58 PM  Result Value Ref Range   Glucose-Capillary 116 (H) 70 - 99 mg/dL    Comment: Glucose reference range applies only to  samples taken after fasting for at least 8 hours.  CBC with Differential     Status: Abnormal   Collection Time: 01/19/23 10:03 PM  Result Value Ref Range   WBC 14.8 (H) 4.0 - 10.5 K/uL   RBC 4.08 3.87 - 5.11 MIL/uL   Hemoglobin 10.9 (L) 12.0 - 15.0 g/dL   HCT 96.0 (L) 45.4 - 09.8 %   MCV 84.6 80.0 - 100.0 fL   MCH 26.7 26.0 - 34.0 pg   MCHC 31.6 30.0 - 36.0 g/dL   RDW 11.9 (H) 14.7 - 82.9 %   Platelets 319 150 - 400 K/uL   nRBC 0.0 0.0 - 0.2 %   Neutrophils Relative % 83 %   Neutro Abs 12.2 (H) 1.7 - 7.7 K/uL   Lymphocytes Relative 11 %   Lymphs Abs 1.6 0.7 - 4.0 K/uL   Monocytes Relative 6 %   Monocytes Absolute 0.9 0.1 - 1.0 K/uL   Eosinophils Relative 0 %   Eosinophils Absolute 0.0 0.0 - 0.5 K/uL   Basophils Relative 0 %   Basophils Absolute 0.1 0.0 - 0.1 K/uL   Immature Granulocytes 0 %   Abs Immature Granulocytes 0.04 0.00 - 0.07 K/uL    Comment: Performed at [facility], [address] [place], [place] [address]  Comprehensive metabolic panel     Status: Abnormal   Collection Time: 01/19/23 10:03 PM  Result Value Ref Range   Sodium 132 (L) 135 - 145 mmol/L   Potassium 3.5 3.5 - 5.1 mmol/L   Chloride 94 (L) 98 - 111 mmol/L   CO2 28 22 - 32 mmol/L   Glucose, Bld 109 (H) 70 - 99 mg/dL    Comment: Glucose reference range applies only to samples taken after fasting for at least 8 hours.   BUN 39 (H) 8 - 23 mg/dL   Creatinine, Ser 0.86 (H) 0.44 - 1.00 mg/dL   Calcium 9.7 8.9 - 57.8 mg/dL   Total Protein 6.9 6.5 - 8.1 g/dL   Albumin 4.0 3.5 - 5.0 g/dL   AST 16 15 - 41 U/L   ALT 8 0 - 44 U/L   Alkaline Phosphatase 86 38 - 126 U/L   Total Bilirubin 0.5 0.3 - 1.2 mg/dL   GFR, Estimated 39 (L) >60 mL/min    Comment: (NOTE) Calculated using the CKD-EPI Creatinine Equation (2021)    Anion gap 10 5 - 15    Comment: Performed at [facility], [address], [place], [place] [address]  Urinalysis, Routine w reflex microscopic  -Urine, Catheterized     Status: Abnormal   Collection Time: 01/19/23 10:03 PM  Result Value Ref Range   Color, Urine YELLOW YELLOW   APPearance HAZY (A) CLEAR   Specific Gravity, Urine 1.020 1.005 - 1.030   pH 5.5 5.0 - 8.0   Glucose, UA NEGATIVE NEGATIVE mg/dL   Hgb urine dipstick MODERATE (A)  NEGATIVE   Bilirubin Urine NEGATIVE NEGATIVE   Ketones, ur NEGATIVE NEGATIVE mg/dL   Protein, ur 30 (A) NEGATIVE mg/dL   Nitrite POSITIVE (A) NEGATIVE   Leukocytes,Ua LARGE (A) NEGATIVE   RBC / HPF 6-10 0 - 5 RBC/hpf   WBC, UA >50 0 - 5 WBC/hpf   Bacteria, UA MANY (A) NONE SEEN   Squamous Epithelial / HPF 0-5 0 - 5 /HPF   Trans Epithel, UA <1    WBC Clumps PRESENT    Mucus PRESENT     Comment: Performed at [facility], [address] [place], [place] [address]   CT Renal Stone Study  Result Date: 01/20/2023 CLINICAL DATA:  Flank pain EXAM: CT ABDOMEN AND PELVIS WITHOUT CONTRAST TECHNIQUE: Multidetector CT imaging of the abdomen and pelvis was performed following the standard protocol without IV contrast. RADIATION DOSE REDUCTION: This exam was performed according to the departmental dose-optimization program which includes automated exposure control, adjustment of the mA and/or kV according to patient size and/or use of iterative reconstruction technique. COMPARISON:  01/06/2023 FINDINGS: Lower chest: Minimal effusion is noted on the left. Calcified granuloma is noted in the right middle lobe. Hepatobiliary: No focal liver abnormality is seen. No gallstones, gallbladder wall thickening, or biliary dilatation. Pancreas: Unremarkable. No pancreatic ductal dilatation or surrounding inflammatory changes. Spleen: Normal in size without focal abnormality. Adrenals/Urinary Tract: Adrenal glands are within normal limits. Increasing fullness of the left collecting system is noted. The ureter is dilated without evidence of ureteral stone. Bladder is partially distended with unopacified  urine. Stomach/Bowel: Scattered diverticular change of the colon is noted. No evidence of diverticulitis is seen. Mild retained fecal material is noted. The appendix is within normal limits. Small bowel shows no acute abnormality. Stomach shows a moderate to large hiatal hernia. Vascular/Lymphatic: Aortic atherosclerosis. No enlarged abdominal or pelvic lymph nodes. Reproductive: Uterus and bilateral adnexa are unremarkable. Other: No abdominal wall hernia or abnormality. No abdominopelvic ascites. Musculoskeletal: Left femoral medullary rod is noted. No acute bony abnormality is seen. No rib abnormality is noted. Multiple chronic compression deformities are noted in the visualized thoracolumbar spine. These are stable in appearance from the prior exam. IMPRESSION: Fullness of the left collecting system left ureter to the level of the ureterovesical junction without stone. This may be related to the patient's known history of bladder carcinoma although no discrete mass is seen. Diverticulosis without diverticulitis. Chronic compression deformities in the thoracolumbar spine. Electronically Signed   By: Alcide Clever M.D.   On: 01/20/2023 00:05   DG Chest 2 View  Result Date: 01/19/2023 CLINICAL DATA:  Shortness of breath, with prior history of pneumonia and fluid overload. EXAM: CHEST - 2 VIEW COMPARISON:  CTA chest 01/05/2023 FINDINGS: The heart is slightly enlarged. Interstitial edema and perihilar vascular congestion noted previously have resolved. There are chronic interstitial changes in the left mid lung without evidence of focal acute process. Additional chronic fibrosis left apex. No pleural effusion is seen. There is a moderate-sized hiatal hernia. Tortuosity of the aorta with scattered calcification. There is osteopenia with chronic compression fractures in the lower thoracic spine with lower thoracic kyphosis. No new compression fracture is seen. IMPRESSION: 1. No evidence of acute chest process.  Chronic changes in the left mid lung and left apex. 2. Hiatal hernia. 3. Aortic atherosclerosis. 4. Osteopenia with lower thoracic chronic compression fractures and kyphosis. Electronically Signed   By: Almira Bar M.D.   On: 01/19/2023 23:07   CT Head Wo Contrast  Result Date: 01/19/2023 CLINICAL DATA:  Weakness and delirium with low-grade fever. EXAM: CT HEAD WITHOUT CONTRAST TECHNIQUE: Contiguous axial images were obtained from the base of the skull through the vertex without intravenous contrast. RADIATION DOSE REDUCTION: This exam was performed according to the departmental dose-optimization program which includes automated exposure control, adjustment of the mA and/or kV according to patient size and/or use of iterative reconstruction technique. COMPARISON:  Head CT 11/16/2019 FINDINGS: Brain: There is moderately advanced atrophy with advanced chronic small-vessel disease of the cerebral white matter with atrophic ventriculomegaly versus normal pressure hydrocephalus. The ventricles are stable in size and configuration with no midline shift. There is relatively mild cerebellar atrophy. No new asymmetry is seen worrisome for an acute infarct, hemorrhage or mass. A few tiny chronic bilateral gangliocapsular lacunar infarcts are again shown. The basal cisterns are clear. There is senescent mineralization in the basal ganglia. Vascular: There are patchy calcifications in the carotid siphons. No hyperdense central vessels. Skull: There is osteopenia without evidence of fractures or focal bone lesions. Sinuses/Orbits: No acute finding. Other: Visualized mastoid air cells are clear with bilateral petrous apex pneumatization. IMPRESSION: 1. No acute intracranial CT findings or interval changes. 2. Moderate atrophy and advanced chronic small-vessel disease with atrophic ventriculomegaly versus normal pressure hydrocephalus. 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 3. Osteopenia. Electronically Signed   By: Almira Bar  M.D.   On: 01/19/2023 22:52    Pending Labs Unresulted Labs (From admission, onward)    None       Vitals/Pain Today's Vitals   01/20/23 0130 01/20/23 0238 01/20/23 0244 01/20/23 0258  BP: 123/70   110/64  Pulse: 86 92 85 85  Resp: (!) 21   (!) 21  Temp:    98 F (36.7 C)  TempSrc:      SpO2: 95% (!) 85% 94% 97%  Weight:        Isolation Precautions No active isolations  Medications Medications  cefTRIAXone (ROCEPHIN) 2 g in sodium chloride 0.9 % 100 mL IVPB (0 g Intravenous Stopped 01/20/23 0046)    Mobility manual wheelchair     Focused Assessments    R Recommendations: See Admitting Provider Note  Report given to:   Additional Notes:   pt hx of bladder cancer currently receiving tx, with UTI, pt alert and oriented x4

## 2023-01-20 NOTE — ED Notes (Signed)
Patient placed on 2LNC due to desaturations while sleeping.

## 2023-01-21 DIAGNOSIS — N390 Urinary tract infection, site not specified: Secondary | ICD-10-CM | POA: Diagnosis not present

## 2023-01-21 LAB — COMPREHENSIVE METABOLIC PANEL
ALT: 10 U/L (ref 0–44)
AST: 16 U/L (ref 15–41)
Albumin: 3 g/dL — ABNORMAL LOW (ref 3.5–5.0)
Alkaline Phosphatase: 66 U/L (ref 38–126)
Anion gap: 7 (ref 5–15)
BUN: 29 mg/dL — ABNORMAL HIGH (ref 8–23)
CO2: 23 mmol/L (ref 22–32)
Calcium: 8.2 mg/dL — ABNORMAL LOW (ref 8.9–10.3)
Chloride: 102 mmol/L (ref 98–111)
Creatinine, Ser: 0.95 mg/dL (ref 0.44–1.00)
GFR, Estimated: 57 mL/min — ABNORMAL LOW (ref >60)
Glucose, Bld: 101 mg/dL — ABNORMAL HIGH (ref 70–99)
Potassium: 3.6 mmol/L (ref 3.5–5.1)
Sodium: 132 mmol/L — ABNORMAL LOW (ref 135–145)
Total Bilirubin: 0.4 mg/dL (ref 0.3–1.2)
Total Protein: 6.2 g/dL — ABNORMAL LOW (ref 6.5–8.1)

## 2023-01-21 LAB — CBC
HCT: 31.5 % — ABNORMAL LOW (ref 36.0–46.0)
Hemoglobin: 9.4 g/dL — ABNORMAL LOW (ref 12.0–15.0)
MCH: 26 pg (ref 26.0–34.0)
MCHC: 29.8 g/dL — ABNORMAL LOW (ref 30.0–36.0)
MCV: 87 fL (ref 80.0–100.0)
Platelets: 258 10*3/uL (ref 150–400)
RBC: 3.62 MIL/uL — ABNORMAL LOW (ref 3.87–5.11)
RDW: 15.9 % — ABNORMAL HIGH (ref 11.5–15.5)
WBC: 6.4 10*3/uL (ref 4.0–10.5)
nRBC: 0 % (ref 0.0–0.2)

## 2023-01-21 LAB — CULTURE, BLOOD (ROUTINE X 2)
Special Requests: ADEQUATE
Special Requests: ADEQUATE

## 2023-01-21 LAB — URINE CULTURE: Culture: <10000 — AB

## 2023-01-21 LAB — MAGNESIUM: Magnesium: 2.1 mg/dL (ref 1.7–2.4)

## 2023-01-21 MED ORDER — CEFDINIR 300 MG PO CAPS: 300.0000 mg | ORAL_CAPSULE | Freq: Two times a day (BID) | ORAL | 0 refills | Status: AC

## 2023-01-21 NOTE — Plan of Care (Signed)

## 2023-01-21 NOTE — Discharge Summary (Signed)
Physician Discharge Summary   Patient: Gina Mcgee MRN: [id number] DOB: 1930/02/13  Admit date:     01/19/2023  Discharge date: 01/21/23  Discharge Physician: Tyrone Nine   PCP: [name], MD   Recommendations at discharge:  Follow up with urology in the next week or so to investigate CT findings of "fullness of the left collecting system" to the level of UVJ. Discussed w/pt and daughter who will follow up with her urologist in [facility] system.  Follow up with PCP in 1-2 weeks, consider repeat CBC, BMP, and anemia panel.   Discharge Diagnoses: Principal Problem:   UTI (urinary tract infection)   Hospital Course: HPI: Gina Mcgee is a 89 y.o. female with medical history significant of hypertension, atrial fibrillation on NOAC, recent pansensitive E. coli UTI and bacteremia, anemia, remote history of bladder cancer.  Patient was discharged from the hospital on 4/12 after an admission for acute metabolic encephalopathy in the setting of E. coli UTI and bacteremia.  Patient was discharged on amoxicillin after completing 3 days of IV Rocephin.  Patient presented to the ER once again on the 15th and was found to have pneumonia and some slight volume overload.  She was given Lasix and her antibiotics were changed to Augmentin and azithromycin.  Patient has presented back to the hospital.  On 4/29 with complaints of generalized weakness and fevers.   Upon initial presentation to triage patient was afebrile and was normotensive with room air O2 saturations of 98%.  She was slightly tachypneic.  Labs revealed hyponatremia sodium 132 with a BUN of 39 and a creatinine of 1.31, white count 14,800 with left shift, highly abnormal urinalysis with hazy appearance, moderate hemoglobin, large leukocytes, positive nitrite and 30 of protein, many bacteria, WBC clumps present with greater than 50 WBCs.  Patient was empirically treated with IV Rocephin by the EDP.  Because of the amount of hemoglobin  count in the urinalysis CT renal stone study was completed.  There were no obstructing issues such as a stone but there was fullness of the left collecting system of the left ureter to the level of the ureterovesical juncture which may be related to her history of bladder carcinoma.  No discrete mass was seen.  Hospitalist service was asked to evaluate the patient for admission.   Antibiotics were given and then urine culture sent which is negative. Her symptoms have abruptly improved and she is stable for discharge, requesting discharge with 24 hour care at home. See below for details.  Assessment and Plan: Recurrent UTI/recent pansensitive E. coli UTI and bacteremia - Urine culture collected after ceftriaxone started showed no significant growth, blood cultures NGTD, leukocytosis resolved, pt afebrile and feels very well. Will discharge on omnicef and have her follow up with her urologist for CT abnormalities in the setting of bladder CA.     AKI on stage IIIb CKD: Improved with IVF, now taking robust po. Suggest recheck at follow up and avoiding nephrotoxins.    Hypokalemia: Supplemented   Atrial fibrillation, HTN - Continue home medications  Physical deconditioning Patient has had multiple physiologic insults beginning with admission for UTI and bacteremia beginning of April and development of pulmonary infection requiring antibiotic treatment mid April PT and OT evaluation performed, home health arranged by CM.  Consultants: None Procedures performed: None  Disposition: Home Diet recommendation: As tolerated DISCHARGE MEDICATION: Allergies as of 01/21/2023       Reactions   Aspirin Other (See Comments)  Gets jitter with large doses   Doxycycline Diarrhea   Fish Allergy Nausea Only, Other (See Comments)   "makes me jumpy and twitchy, but grand children are allergic"    Nsaids    Can take for short periods of time. Naprosyn -feels dazed   Shellfish-derived Products Other (See  Comments)   "makes me jumpy and twitchy, but grand children are allergic"   Aloe Rash   Sulfa Antibiotics Rash        Medication List     STOP taking these medications    azithromycin 250 MG tablet Commonly known as: ZITHROMAX       TAKE these medications    apixaban 2.5 MG Tabs tablet Commonly known as: Eliquis Take 1 tablet (2.5 mg total) by mouth 2 (two) times daily.   cefdinir 300 MG capsule Commonly known as: OMNICEF Take 1 capsule (300 mg total) by mouth 2 (two) times daily for 5 days.   cyanocobalamin 1000 MCG/ML injection Commonly known as: VITAMIN B12 Inject into the muscle every 30 (thirty) days.   diltiazem 120 MG 24 hr capsule Commonly known as: CARDIZEM CD Take 1 capsule (120 mg total) by mouth daily.   ferrous sulfate 325 (65 FE) MG tablet Take 1 tablet (325 mg total) by mouth every 3 (three) days.   hydrochlorothiazide 12.5 MG tablet Commonly known as: HYDRODIURIL Take 12.5 mg by mouth daily.   ketoconazole 2 % cream Commonly known as: NIZORAL Apply 1 Application topically at bedtime. Apply to toes   memantine 10 MG tablet Commonly known as: NAMENDA Take 10 mg by mouth 2 (two) times daily.   metoprolol succinate 25 MG 24 hr tablet Commonly known as: TOPROL-XL TAKE 1 TABLET(25 MG) BY MOUTH DAILY What changed:  how much to take how to take this when to take this additional instructions   multivitamin with minerals tablet Take 1 tablet by mouth daily.   senna-docusate 8.6-50 MG tablet Commonly known as: Senokot-S Take 1 tablet by mouth daily.   Vitamin D3 10 MCG (400 UNIT) Caps Take 400 Units by mouth in the morning, at noon, in the evening, and at bedtime.        Follow-up Information     [name], MD Follow up.   Specialty: Family Medicine Contact information: [address] [place] [address] [phone]         Bebe Shaggy., MD. Schedule an appointment as soon as possible for a visit.    Specialty: Urology Contact information: [address] [place] [address] [phone]                Discharge Exam: Ceasar Mons Weights   01/19/23 2013 01/20/23 0410  Weight: 59 kg 54.7 kg  BP 130/82 (BP Location: Right Arm)   Pulse 80   Temp (!) 97.5 F (36.4 C) (Oral)   Resp 18   Ht 5\' 2"  (1.575 m)   Wt 54.7 kg   SpO2 94%   BMI 22.08 kg/m   Well-appearing elderly female in good spirits Clear, nonlabored Soft, NT, ND, +BS Alert, oriented for the most part, no focal deficits.  Condition at discharge: stable  The results of significant diagnostics from this hospitalization (including imaging, microbiology, ancillary and laboratory) are listed below for reference.   Imaging Studies: CT Renal Stone Study  Result Date: 01/20/2023 CLINICAL DATA:  Flank pain EXAM: CT ABDOMEN AND PELVIS WITHOUT CONTRAST TECHNIQUE: Multidetector CT imaging of the abdomen and pelvis was performed following the  standard protocol without IV contrast. RADIATION DOSE REDUCTION: This exam was performed according to the departmental dose-optimization program which includes automated exposure control, adjustment of the mA and/or kV according to patient size and/or use of iterative reconstruction technique. COMPARISON:  01/06/2023 FINDINGS: Lower chest: Minimal effusion is noted on the left. Calcified granuloma is noted in the right middle lobe. Hepatobiliary: No focal liver abnormality is seen. No gallstones, gallbladder wall thickening, or biliary dilatation. Pancreas: Unremarkable. No pancreatic ductal dilatation or surrounding inflammatory changes. Spleen: Normal in size without focal abnormality. Adrenals/Urinary Tract: Adrenal glands are within normal limits. Increasing fullness of the left collecting system is noted. The ureter is dilated without evidence of ureteral stone. Bladder is partially distended with unopacified urine. Stomach/Bowel: Scattered diverticular change of the colon is noted. No  evidence of diverticulitis is seen. Mild retained fecal material is noted. The appendix is within normal limits. Small bowel shows no acute abnormality. Stomach shows a moderate to large hiatal hernia. Vascular/Lymphatic: Aortic atherosclerosis. No enlarged abdominal or pelvic lymph nodes. Reproductive: Uterus and bilateral adnexa are unremarkable. Other: No abdominal wall hernia or abnormality. No abdominopelvic ascites. Musculoskeletal: Left femoral medullary rod is noted. No acute bony abnormality is seen. No rib abnormality is noted. Multiple chronic compression deformities are noted in the visualized thoracolumbar spine. These are stable in appearance from the prior exam. IMPRESSION: Fullness of the left collecting system left ureter to the level of the ureterovesical junction without stone. This may be related to the patient's known history of bladder carcinoma although no discrete mass is seen. Diverticulosis without diverticulitis. Chronic compression deformities in the thoracolumbar spine. Electronically Signed   By: Alcide Clever M.D.   On: 01/20/2023 00:05   DG Chest 2 View  Result Date: 01/19/2023 CLINICAL DATA:  Shortness of breath, with prior history of pneumonia and fluid overload. EXAM: CHEST - 2 VIEW COMPARISON:  CTA chest 01/05/2023 FINDINGS: The heart is slightly enlarged. Interstitial edema and perihilar vascular congestion noted previously have resolved. There are chronic interstitial changes in the left mid lung without evidence of focal acute process. Additional chronic fibrosis left apex. No pleural effusion is seen. There is a moderate-sized hiatal hernia. Tortuosity of the aorta with scattered calcification. There is osteopenia with chronic compression fractures in the lower thoracic spine with lower thoracic kyphosis. No new compression fracture is seen. IMPRESSION: 1. No evidence of acute chest process. Chronic changes in the left mid lung and left apex. 2. Hiatal hernia. 3. Aortic  atherosclerosis. 4. Osteopenia with lower thoracic chronic compression fractures and kyphosis. Electronically Signed   By: Almira Bar M.D.   On: 01/19/2023 23:07   CT Head Wo Contrast  Result Date: 01/19/2023 CLINICAL DATA:  Weakness and delirium with low-grade fever. EXAM: CT HEAD WITHOUT CONTRAST TECHNIQUE: Contiguous axial images were obtained from the base of the skull through the vertex without intravenous contrast. RADIATION DOSE REDUCTION: This exam was performed according to the departmental dose-optimization program which includes automated exposure control, adjustment of the mA and/or kV according to patient size and/or use of iterative reconstruction technique. COMPARISON:  Head CT 11/16/2019 FINDINGS: Brain: There is moderately advanced atrophy with advanced chronic small-vessel disease of the cerebral white matter with atrophic ventriculomegaly versus normal pressure hydrocephalus. The ventricles are stable in size and configuration with no midline shift. There is relatively mild cerebellar atrophy. No new asymmetry is seen worrisome for an acute infarct, hemorrhage or mass. A few tiny chronic bilateral gangliocapsular lacunar infarcts are again shown.  The basal cisterns are clear. There is senescent mineralization in the basal ganglia. Vascular: There are patchy calcifications in the carotid siphons. No hyperdense central vessels. Skull: There is osteopenia without evidence of fractures or focal bone lesions. Sinuses/Orbits: No acute finding. Other: Visualized mastoid air cells are clear with bilateral petrous apex pneumatization. IMPRESSION: 1. No acute intracranial CT findings or interval changes. 2. Moderate atrophy and advanced chronic small-vessel disease with atrophic ventriculomegaly versus normal pressure hydrocephalus. 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 3. Osteopenia. Electronically Signed   By: Almira Bar M.D.   On: 01/19/2023 22:52   CT Renal Stone Study  Result Date:  01/06/2023 CLINICAL DATA:  Right abdominal/back pain EXAM: CT ABDOMEN AND PELVIS WITHOUT CONTRAST TECHNIQUE: Multidetector CT imaging of the abdomen and pelvis was performed following the standard protocol without IV contrast. RADIATION DOSE REDUCTION: This exam was performed according to the departmental dose-optimization program which includes automated exposure control, adjustment of the mA and/or kV according to patient size and/or use of iterative reconstruction technique. COMPARISON:  None Available. FINDINGS: Lower chest: Evaluated on recent CTA chest. Hepatobiliary: Unenhanced liver is unremarkable. Status post cholecystectomy. No intrahepatic or extrahepatic duct dilatation. Pancreas: Within normal limits. Spleen: Within normal limits. Adrenals/Urinary Tract: Adrenal glands are within normal limits. Right kidney is notable for mild hydronephrosis with a prominent extrarenal pelvis. 16 mm simple right upper pole renal cyst (series 3/image 13), benign (Bosniak I). No follow-up is recommended. Mildly heterogeneous enhancement of the left upper kidney, nonspecific, but at least raising the possibility pyelonephritis. Excretory contrast in the bilateral renal collecting systems. Bladder is within normal limits. Stomach/Bowel: Stomach is within normal limits. No evidence of bowel obstruction. Appendix is not discretely visualized. Scattered colonic diverticulosis, without evidence of diverticulitis. Vascular/Lymphatic: No evidence of abdominal aortic aneurysm. Atherosclerotic calcifications of the abdominal aorta and branch vessels. No suspicious abdominopelvic lymphadenopathy. Reproductive: Uterus is within normal limits. Bilateral ovaries are within normal limits. Other: No abdominopelvic ascites.  Moderate Musculoskeletal: Compression fracture deformities at T11, L1, and L3. Mild compression fracture deformity at T12. These findings are age indeterminate but likely chronic. IMPRESSION: Mildly heterogeneous  enhancement of the left upper kidney, nonspecific, but at least raising the possibility of pyelonephritis. Mild right hydronephrosis with a prominent right extrarenal pelvis. Additional ancillary findings as above. Electronically Signed   By: Charline Bills M.D.   On: 01/06/2023 01:11   CT Angio Chest PE W and/or Wo Contrast  Result Date: 01/05/2023 CLINICAL DATA:  PE suspected, high probability EXAM: CT ANGIOGRAPHY CHEST WITH CONTRAST TECHNIQUE: Multidetector CT imaging of the chest was performed using the standard protocol during bolus administration of intravenous contrast. Multiplanar CT image reconstructions and MIPs were obtained to evaluate the vascular anatomy. RADIATION DOSE REDUCTION: This exam was performed according to the departmental dose-optimization program which includes automated exposure control, adjustment of the mA and/or kV according to patient size and/or use of iterative reconstruction technique. CONTRAST:  75mL OMNIPAQUE IOHEXOL 350 MG/ML SOLN COMPARISON:  Chest radiographs 01/05/2023 and CTA chest 08/17/2022 FINDINGS: Cardiovascular: Satisfactory opacification of the pulmonary arteries to the lobar level. The segmental and subsegmental arteries are obscured in the lung bases. No evidence of pulmonary embolism. Mild cardiomegaly. No pericardial effusion. Coronary artery and aortic atherosclerotic calcification. Mediastinum/Nodes: Moderate hiatal hernia.  No thoracic adenopathy. Lungs/Pleura: Biapical pleural-parenchymal scarring, similar to prior. Airspace disease is again noted in the left upper and lower lobes similar to prior. There are a few new ground-glass opacities in the lateral right upper lobe (7/41  and 7/17). These are favored infectious/inflammatory however follow-up is recommended. New small bilateral pleural effusions. Upper Abdomen: There is new partially visualized distention of the left renal pelvis proximal ureter compared with 08/17/2022. Musculoskeletal:  Demineralization. No acute fracture. Chronic compression fractures of T11-L1 are similar to 06/17/2022. Chronic fracture deformity proximal left clavicle. Unchanged sclerosis, favored posttraumatic in the left lateral sixth rib. Review of the MIP images confirms the above findings. IMPRESSION: 1. No evidence of pulmonary embolism to the lobar level. The segmental and subsegmental arteries are obscured in the lung bases. 2. New ground-glass opacities in the right upper lobe, likely infectious/inflammatory. Unchanged airspace disease in the left upper and lower lobes. Continued attention on follow-up is recommended to ensure stability 3. New small bilateral pleural effusions. 4. New partially visualized distention of the left renal pelvis and proximal ureter compared with 08/17/2022. If there is concern for ureteral obstruction consider CT abdomen and pelvis for further evaluation. Aortic Atherosclerosis (ICD10-I70.0). Electronically Signed   By: Minerva Fester M.D.   On: 01/05/2023 23:29   DG Chest 2 View  Result Date: 01/05/2023 CLINICAL DATA:  sob EXAM: CHEST - 2 VIEW COMPARISON:  Chest x-ray 12/30/2022 FINDINGS: The heart and mediastinal contours are within normal limits. Aortic calcification. Hiatal hernia. Low lung volumes with question left lower lobe airspace opacity and possible trace pleural effusion. No focal consolidation. Chronic coarsened markings with no overt pulmonary edema. No right pleural effusion. No pneumothorax. No acute osseous abnormality. IMPRESSION: 1. Low lung volumes with question left lower lobe airspace opacity and possible trace pleural effusion. Recommend repeat PA and lateral chest x-ray with improved inspiratory effort. 2. Hiatal hernia. 3.  Aortic Atherosclerosis (ICD10-I70.0). Electronically Signed   By: Tish Frederickson M.D.   On: 01/05/2023 17:48   ECHOCARDIOGRAM COMPLETE  Result Date: 01/01/2023    ECHOCARDIOGRAM REPORT   Patient Name:   ARIELA MOCHIZUKI Date of Exam:  01/01/2023 Medical Rec #:  [id number]       Height:       62.0 in Accession #:    [id number]      Weight:       129.2 lb Date of Birth:  1929-10-13       BSA:          1.588 m Patient Age:    89 years        BP:           123/77 mmHg Patient Gender: F               HR:           87 bpm. Exam Location:  Inpatient Procedure: 2D Echo, Color Doppler and Cardiac Doppler Indications:    R94.31 Abnormal EKG  History:        Patient has prior history of Echocardiogram examinations, most                 recent 08/13/2022. Abnormal ECG, Arrythmias:Atrial Fibrillation;                 Risk Factors:Hypertension.  Sonographer:    Milbert Coulter Referring Phys: [id number] [facility] CUSTOVIC  Sonographer Comments: Suboptimal parasternal window and suboptimal subcostal window. 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 IMPRESSIONS  1. Left ventricular ejection fraction, by estimation, is 55 to 60%. The left ventricle has normal function. Left ventricular endocardial border not optimally defined to evaluate regional wall motion. Left ventricular diastolic parameters are indeterminate.  2. Right ventricular systolic function is mildly reduced. The right ventricular size is normal. There is normal pulmonary artery systolic pressure. The estimated right ventricular systolic pressure is 30.2 mmHg.  3. The mitral valve is normal in structure. No evidence of mitral valve regurgitation. No evidence of mitral stenosis.  4. The aortic valve is tricuspid. Aortic valve regurgitation is mild to moderate. Aortic valve sclerosis/calcification is present, without any evidence of aortic stenosis. Aortic regurgitation PHT measures 292 msec. Aortic valve area, by VTI measures 1.92 cm. Aortic valve mean gradient measures 5.0 mmHg. Aortic valve Vmax measures 1.53 m/s.  5. The inferior vena cava is normal in size with greater than 50% respiratory variability, suggesting right atrial pressure of 3  mmHg.  6. Consider definitiy contrast for wall motion FINDINGS  Left Ventricle: Left ventricular ejection fraction, by estimation, is 55 to 60%. The left ventricle has normal function. Left ventricular endocardial border not optimally defined to evaluate regional wall motion. The left ventricular internal cavity size was normal in size. There is no left ventricular hypertrophy. Left ventricular diastolic parameters are indeterminate. Normal left ventricular filling pressure. Right Ventricle: The right ventricular size is normal. No increase in right ventricular wall thickness. Right ventricular systolic function is mildly reduced. There is normal pulmonary artery systolic pressure. The tricuspid regurgitant velocity is 2.61 m/s, and with an assumed right atrial pressure of 3 mmHg, the estimated right ventricular systolic pressure is 30.2 mmHg. Left Atrium: Left atrial size was normal in size. Right Atrium: Right atrial size was normal in size. Pericardium: There is no evidence of pericardial effusion. Mitral Valve: The mitral valve is normal in structure. Mild mitral annular calcification. No evidence of mitral valve regurgitation. No evidence of mitral valve stenosis. Tricuspid Valve: The tricuspid valve is normal in structure. Tricuspid valve regurgitation is mild . No evidence of tricuspid stenosis. Aortic Valve: The aortic valve is tricuspid. Aortic valve regurgitation is mild to moderate. Aortic regurgitation PHT measures 292 msec. Aortic valve sclerosis/calcification is present, without any evidence of aortic stenosis. Aortic valve mean gradient measures 5.0 mmHg. Aortic valve peak gradient measures 9.4 mmHg. Aortic valve area, by VTI measures 1.92 cm. Pulmonic Valve: The pulmonic valve was normal in structure. Pulmonic valve regurgitation is mild. No evidence of pulmonic stenosis. Aorta: The aortic root is normal in size and structure. Venous: The inferior vena cava is normal in size with greater than 50%  respiratory variability, suggesting right atrial pressure of 3 mmHg. IAS/Shunts: No atrial level shunt detected by color flow Doppler.  LEFT VENTRICLE PLAX 2D LVIDd:         3.30 cm   Diastology LVIDs:         2.30 cm   LV e' medial:    5.22 cm/s LV PW:         1.10 cm   LV E/e' medial:  14.3 LV IVS:        1.00 cm   LV e' lateral:   11.00 cm/s LVOT diam:     2.00 cm   LV E/e' lateral: 6.8 LV SV:         50 LV SV Index:   31 LVOT Area:     3.14 cm  RIGHT VENTRICLE RV S prime:     10.60 cm/s TAPSE (M-mode): 1.5 cm AORTIC VALVE AV Area (Vmax):    1.92 cm AV Area (Vmean):   1.81 cm AV Area (VTI):     1.92 cm AV Vmax:  153.00 cm/s AV Vmean:          107.000 cm/s AV VTI:            0.258 m AV Peak Grad:      9.4 mmHg AV Mean Grad:      5.0 mmHg LVOT Vmax:         93.60 cm/s LVOT Vmean:        61.600 cm/s LVOT VTI:          0.158 m LVOT/AV VTI ratio: 0.61 AI PHT:            292 msec MITRAL VALVE               TRICUSPID VALVE MV Area (PHT): 5.02 cm    TR Peak grad:   27.2 mmHg MV Decel Time: 151 msec    TR Vmax:        261.00 cm/s MV E velocity: 74.70 cm/s MV A velocity: 70.60 cm/s  SHUNTS MV E/A ratio:  1.06        Systemic VTI:  0.16 m                            Systemic Diam: 2.00 cm Armanda Magic MD Electronically signed by Armanda Magic MD Signature Date/Time: 01/01/2023/3:57:52 PM    Final    DG Chest Port 1 View  Result Date: 12/30/2022 CLINICAL DATA:  Possible sepsis. EXAM: PORTABLE CHEST 1 VIEW COMPARISON:  08/16/2022. FINDINGS: Cardiac silhouette normal in size. Moderate hiatal hernia, stable. No mediastinal or hilar masses. Clear lungs.  No convincing pleural effusion or pneumothorax. Skeletal structures are diffusely demineralized. IMPRESSION: No acute cardiopulmonary disease. Electronically Signed   By: Amie Portland M.D.   On: 12/30/2022 17:37    Microbiology: Results for orders placed or performed during the hospital encounter of 01/19/23  Culture, blood (Routine X 2) w Reflex to ID Panel      Status: None (Preliminary result)   Collection Time: 01/20/23  6:57 AM   Specimen: BLOOD  Result Value Ref Range Status   Specimen Description   Final    BLOOD BLOOD RIGHT ARM AEROBIC BOTTLE ONLY Performed at [facility], [address], [place], [place] [address]    Special Requests   Final    BOTTLES DRAWN AEROBIC ONLY Blood Culture adequate volume Performed at [facility], [address] [place], [place] [address]    Culture   Final    NO GROWTH < 24 HOURS Performed at [facility], [address], [place], [place] [address]    Report Status PENDING  Incomplete  Culture, blood (Routine X 2) w Reflex to ID Panel     Status: None (Preliminary result)   Collection Time: 01/20/23  6:57 AM   Specimen: BLOOD  Result Value Ref Range Status   Specimen Description   Final    BLOOD BLOOD RIGHT HAND AEROBIC BOTTLE ONLY Performed at [facility], [address], [place], [place] [address]    Special Requests   Final    BOTTLES DRAWN AEROBIC ONLY Blood Culture adequate volume Performed at [facility], [address], [place], [place] [address]    Culture   Final    NO GROWTH < 24 HOURS Performed at [facility], [address] [place], [place] [address]    Report Status PENDING  Incomplete  Urine Culture (for pregnant, neutropenic  or urologic patients or patients with an indwelling urinary catheter)     Status: Abnormal   Collection Time: 01/20/23  9:00 AM   Specimen: Urine, Clean Catch  Result Value Ref Range Status   Specimen Description   Final    URINE, CLEAN CATCH Performed at [facility], [address] [place], [place] [address]    Special Requests   Final    NONE Performed at [facility], [address], [place], [place] [address]    Culture (A)  Final    <10,000 COLONIES/mL INSIGNIFICANT GROWTH Performed at [facility], [address] [place], [place] [address]    Report Status 01/21/2023 FINAL  Final    Labs: CBC: Recent Labs  Lab 01/19/23 2203 01/20/23 0657 01/21/23 0514  WBC 14.8* 10.9* 6.4  NEUTROABS 12.2* 8.6*  --   HGB 10.9* 10.4* 9.4*  HCT 34.5* 33.8* 31.5*  MCV 84.6 85.6 87.0  PLT 319 294 258   Basic Metabolic Panel: Recent Labs  Lab 01/19/23 2203 01/20/23 0657 01/21/23 0514  NA 132* 133* 132*  K 3.5 3.1* 3.6  CL 94* 96* 102  CO2 28 26 23   GLUCOSE 109* 104* 101*  BUN 39* 35* 29*  CREATININE 1.31* 1.18* 0.95  CALCIUM 9.7 9.2 8.2*  MG  --  2.4 2.1   Liver Function Tests: Recent Labs  Lab 01/19/23 2203 01/20/23 0657 01/21/23 0514  AST 16 19 16   ALT 8 11 10   ALKPHOS 86 81 66  BILITOT 0.5 0.7 0.4  PROT 6.9 6.8 6.2*  ALBUMIN 4.0 3.6 3.0*   CBG: Recent Labs  Lab 01/19/23 2158  GLUCAP 116*    Discharge time spent: greater than 30 minutes.  Signed: Tyrone Nine, MD [facility] 01/21/2023

## 2023-01-21 NOTE — Evaluation (Signed)
Occupational Therapy Evaluation Patient Details Name: Gina Mcgee MRN: [id number] DOB: 28-Feb-1930 Today's Date: 01/21/2023   History of Present Illness 89 y.o. female who discharged from the hospital on 4/12 after an admission for acute metabolic encephalopathy in the setting of E. coli UTI and bacteremia, now admitted for weakness, fever, recurrent UTI. cxr negative.  medical history significant of hypertension, atrial fibrillation on NOAC, recent pansensitive E. coli UTI and bacteremia, anemia, remote history of bladder cancer.   Clinical Impression   Pt presents with decline in function and safety with ADLs and ADL mobility with impaired strength, balance, endurance and hx of L UE impairments. PTA pt lives at home with 24/7 assist from [facility] and her daughter. Pt has assist with home mgt, bathing, LB dressing and uses a rollater for mobility. Pt currently requires min A to transfer to [facility], max A with LB ADLs. Pt would benefit from acute OT services to maximize level of function and safety      Recommendations for follow up therapy are one component of a multi-disciplinary discharge planning process, led by the attending physician.  Recommendations may be updated based on patient status, additional functional criteria and insurance authorization.   Assistance Recommended at Discharge Frequent or constant Supervision/Assistance  Patient can return home with the following A lot of help with bathing/dressing/bathroom;A little help with walking and/or transfers;Assist for transportation;Help with stairs or ramp for entrance;Direct supervision/assist for medications management    Functional Status Assessment  Patient has had a recent decline in their functional status and/or demonstrates limited ability to make significant improvements in function in a reasonable and predictable amount of time  Equipment Recommendations  None recommended by OT    Recommendations for Other Services        Precautions / Restrictions Precautions Precautions: Fall Restrictions Other Position/Activity Restrictions: L wrist impairment PTA, no intrinsic hand strength      Mobility Bed Mobility Overal bed mobility: Needs Assistance Bed Mobility: Supine to Sit     Supine to sit: Min assist     General bed mobility comments: assist to elevate trunk    Transfers Overall transfer level: Needs assistance Equipment used: Rolling walker (2 wheels) Transfers: Sit to/from Stand Sit to Stand: Min assist           General transfer comment: assist to rise and control descent      Balance Overall balance assessment: Needs assistance Sitting-balance support: Feet supported, No upper extremity supported Sitting balance-Leahy Scale: Fair     Standing balance support: During functional activity, Reliant on assistive device for balance, Bilateral upper extremity supported Standing balance-Leahy Scale: Poor                             ADL either performed or assessed with clinical judgement   ADL Overall ADL's : Needs assistance/impaired Eating/Feeding: Set up;Sitting   Grooming: Min guard;Standing   Upper Body Bathing: With caregiver independent assisting;Sitting;Minimal assistance Upper Body Bathing Details (indicate cue type and reason): simulated Lower Body Bathing: Sitting/lateral leans;Moderate assistance Lower Body Bathing Details (indicate cue type and reason): simulated, at baseline per caregiver Upper Body Dressing : Minimal assistance;Sitting   Lower Body Dressing: Maximal assistance;Sitting/lateral leans Lower Body Dressing Details (indicate cue type and reason): at baseline per caregiver Toilet Transfer: Minimal assistance;Stand-pivot;BSC/3in1   Toileting- Clothing Manipulation and Hygiene: Sitting/lateral lean;Maximal assistance       Functional mobility during ADLs: Minimal assistance  Vision Baseline Vision/History: 1 Wears glasses Ability  to See in Adequate Light: 0 Adequate Patient Visual Report: No change from baseline       Perception     Praxis      Pertinent Vitals/Pain Pain Assessment Pain Assessment: No/denies pain     Hand Dominance Right   Extremity/Trunk Assessment Upper Extremity Assessment Upper Extremity Assessment: Generalized weakness;Overall WFL for tasks assessed LUE Deficits / Details: Pt unable to use L hand due to prior nerve injury from a previous orthopedic surgery.   Lower Extremity Assessment Lower Extremity Assessment: Defer to PT evaluation   Cervical / Trunk Assessment Cervical / Trunk Assessment: Kyphotic   Communication Communication Communication: HOH   Cognition Arousal/Alertness: Awake/alert Behavior During Therapy: WFL for tasks assessed/performed Overall Cognitive Status: History of cognitive impairments - at baseline Area of Impairment: Memory                     Memory: Decreased short-term memory               General Comments       Exercises     Shoulder Instructions      Home Living Family/patient expects to be discharged to:: Private residence Living Arrangements: Children Available Help at Discharge: Family;Personal care attendant;Available 24 hours/day Type of Home: House Home Access: Level entry     Home Layout: One level     Bathroom Shower/Tub: Walk-in shower;Tub/shower unit   Bathroom Toilet: Standard     Home Equipment: Rollator (4 wheels);Cane - single point;BSC/3in1;Shower seat;Grab bars - toilet;Grab bars - tub/shower;Hand held shower head;Wheelchair - manual   Additional Comments: Has 24 hr assistance between [employer], personal care aide, & daughter.      Prior Functioning/Environment Prior Level of Function : Needs assist             Mobility Comments: Pt was able to ambulate short distances ("across the room") prior to recent admission with rollator. ADLs Comments: Assistance with LB bathing and LB  dressing and IADLs from PCAs and daughters. PCA states that she needs assistance with getting in and out of shower and drying off.        OT Problem List: Decreased activity tolerance;Impaired balance (sitting and/or standing);Decreased knowledge of use of DME or AE      OT Treatment/Interventions: Self-care/ADL training;Therapeutic activities;Therapeutic exercise;Energy conservation;DME and/or AE instruction;Patient/family education;Balance training    OT Goals(Current goals can be found in the care plan section) Acute Rehab OT Goals Patient Stated Goal: "go home and get home therapy" OT Goal Formulation: With patient Time For Goal Achievement: 02/04/23 Potential to Achieve Goals: Fair ADL Goals Pt Will Perform Grooming: with supervision;with set-up;with caregiver independent in assisting;sitting Pt Will Perform Upper Body Bathing: with min guard assist;with supervision;with caregiver independent in assisting Pt Will Perform Upper Body Dressing: with min guard assist;with supervision;with caregiver independent in assisting Pt Will Transfer to Toilet: with min guard assist;with supervision;ambulating Pt Will Perform Toileting - Clothing Manipulation and hygiene: with min assist;with min guard assist;sitting/lateral leans;with caregiver independent in assisting  OT Frequency: Min 2X/week    Co-evaluation              AM-PAC OT "6 Clicks" Daily Activity     Outcome Measure Help from another person eating meals?: A Little Help from another person taking care of personal grooming?: A Little Help from another person toileting, which includes using toliet, bedpan, or urinal?: A Lot Help from another person bathing (including  washing, rinsing, drying)?: A Lot Help from another person to put on and taking off regular upper body clothing?: A Little Help from another person to put on and taking off regular lower body clothing?: A Lot 6 Click Score: 15   End of Session Equipment Utilized  During Treatment: Gait belt  Activity Tolerance: Patient tolerated treatment well Patient left: with call bell/phone within reach;with family/visitor present;in chair  OT Visit Diagnosis: Unsteadiness on feet (R26.81);Other abnormalities of gait and mobility (R26.89)                Time: 1478-2956 OT Time Calculation (min): 26 min Charges:  OT General Charges $OT Visit: 1 Visit OT Evaluation $OT Eval Moderate Complexity: 1 Mod OT Treatments $Therapeutic Activity: 8-22 mins    Galen Manila 01/21/2023, 2:17 PM

## 2023-01-21 NOTE — Progress Notes (Signed)
Patient PIV x 2 removed. Post hospital follow-up appointments verified with patient and daughter. Medication regimen discussed along with pharmacy verification. Patient dressed and escorted safely to elevator via wheelchair with Nehemiah Settle, RN for discharge at main entrance via private vehicle.

## 2023-01-21 NOTE — TOC Initial Note (Signed)
Transition of Care [facility]) - Initial/Assessment Note    Patient Details  Name: Gina Mcgee MRN: [id number] Date of Birth: 06-19-1930  Transition of Care [facility]) CM/SW Contact:    Howell Rucks, RN Phone Number: 01/21/2023, 11:18 AM  Clinical Narrative: Met with pt at bedside today to introduce TOC/NCM role and review for [facility] PT recommendation, pt agreeable, HHA in room, call to pt's dtr Natalia Leatherwood), reports pt on service previously with [facility] and prefers to use them again. HHA reports pt has cane, w/c and walker at home. Family to provide transport at discharge. NCM outreached to [facility]- rep-Cheryl- accepted.  PT and face face ordered entered by NCM. Will continue to follow.                   Expected Discharge Plan: Home w Home Health Services Barriers to Discharge: Continued Medical Work up   Patient Goals and CMS Choice Patient states their goals for this hospitalization and ongoing recovery are:: Home with [facility] PT CMS Medicare.gov Compare Post Acute Care list provided to::  Natalia Leatherwood (dtr)) Choice offered to / list presented to : Adult Children      Expected Discharge Plan and Services   Discharge Planning Services: CM Consult   Living arrangements for the past 2 months: Single Family Home                     Date DME Agency Contacted: 01/21/23 Time DME Agency Contacted: 1117              Prior Living Arrangements/Services Living arrangements for the past 2 months: Single Family Home Lives with:: Adult Children Patient language and need for interpreter reviewed:: Yes Do you feel safe going back to the place where you live?: Yes      Need for Family Participation in Patient Care: Yes (Comment)   Current home services: DME (w/c, walker, cane) Criminal Activity/Legal Involvement Pertinent to Current Situation/Hospitalization: No - Comment as needed  Activities of Daily Living Home Assistive Devices/Equipment: Eyeglasses, Wheelchair, Environmental consultant (specify  type) ADL Screening (condition at time of admission) Patient's cognitive ability adequate to safely complete daily activities?: Yes Is the patient deaf or have difficulty hearing?: No Does the patient have difficulty seeing, even when wearing glasses/contacts?: No Does the patient have difficulty concentrating, remembering, or making decisions?: Yes Patient able to express need for assistance with ADLs?: Yes Does the patient have difficulty dressing or bathing?: Yes Independently performs ADLs?: No Communication: Needs assistance Is this a change from baseline?: Pre-admission baseline Dressing (OT): Needs assistance Is this a change from baseline?: Pre-admission baseline Grooming: Needs assistance Is this a change from baseline?: Pre-admission baseline Feeding: Needs assistance Is this a change from baseline?: Pre-admission baseline Bathing: Needs assistance Is this a change from baseline?: Pre-admission baseline Toileting: Needs assistance Is this a change from baseline?: Pre-admission baseline In/Out Bed: Needs assistance Is this a change from baseline?: Pre-admission baseline Walks in Home: Needs assistance Is this a change from baseline?: Pre-admission baseline Does the patient have difficulty walking or climbing stairs?: Yes Weakness of Legs: Both Weakness of Arms/Hands: Both  Permission Sought/Granted Permission sought to share information with : Case Manager Permission granted to share information with : Yes, Verbal Permission Granted  Share Information with NAME: Fannie Knee, RN           Emotional Assessment Appearance:: Appears stated age Attitude/Demeanor/Rapport: Gracious   Orientation: : Oriented to Self, Oriented to Place, Oriented to  Time  Alcohol / Substance Use: Not Applicable Psych Involvement: No (comment)  Admission diagnosis:  UTI (urinary tract infection) [N39.0] Patient Active Problem List   Diagnosis Date Noted   UTI (urinary tract infection)  01/20/2023   E. coli UTI 01/02/2023   Bacteremia 12/31/2022   Acute metabolic encephalopathy 12/31/2022   Anemia 12/31/2022   Hypotension 12/31/2022   E coli bacteremia 12/31/2022   Acute encephalopathy 12/30/2022   Lower urinary tract infectious disease 12/30/2022   Weakness 12/30/2022   Leukocytosis 12/30/2022   Bladder cancer (HCC) 08/18/2022   MCI (mild cognitive impairment) 08/18/2022   RSV (respiratory syncytial virus pneumonia) 08/18/2022   Hypoxic respiratory failure (HCC) 08/18/2022   CAP (community acquired pneumonia) 08/17/2022   Atrial fibrillation (HCC) 04/16/2022   Gastroesophageal reflux disease without esophagitis 11/26/2020   Pernicious anemia 11/26/2020   Osteoporosis    Abnormal CXR 01/20/2013   Preop cardiovascular exam 01/20/2013   Surgery, elective 01/18/2013   Fracture of pubic ramus (HCC) 11/21/2011   Hip pain, acute 11/20/2011   Fall 11/20/2011   Hyponatremia 11/20/2011   Hypokalemia 11/20/2011   Pelvic fracture (HCC) 11/20/2011   HTN (hypertension)    Concussion    History of breast cancer in female    PCP:  [name], MD Pharmacy:   [facility] [address] - [place], [place] - [address] [facility] [address] [facility] [place] [address] Phone: [phone] Fax: [phone]  [id number] [place], [place] - [address] AT [facility] [address] [facility] [address] [place] [address] Phone: [phone] Fax: [phone]  [facility] [address] [place] [address] Phone: [phone] Fax: [phone]     Social Determinants of Health (SDOH) Social History: SDOH Screenings   Food Insecurity: No Food Insecurity (01/20/2023)  Housing: Low Risk  (01/20/2023)  Transportation Needs: No Transportation Needs (01/20/2023)  Utilities: Not At Risk (01/20/2023)  Tobacco Use: Low Risk  (01/19/2023)   SDOH Interventions:     Readmission Risk Interventions     01/21/2023   11:14 AM 01/02/2023   11:46 AM  Readmission Risk Prevention Plan  Transportation Screening Complete Complete  PCP or Specialist Appt within 3-5 Days Complete Complete  HRI or Home Care Consult Complete Complete  Social Work Consult for Recovery Care Planning/Counseling Complete   Palliative Care Screening Not Applicable Not Applicable  Medication Review Oceanographer) Complete Complete

## 2023-01-22 DIAGNOSIS — Z8551 Personal history of malignant neoplasm of bladder: Secondary | ICD-10-CM | POA: Diagnosis not present

## 2023-01-22 DIAGNOSIS — M810 Age-related osteoporosis without current pathological fracture: Secondary | ICD-10-CM | POA: Diagnosis not present

## 2023-01-22 DIAGNOSIS — N390 Urinary tract infection, site not specified: Secondary | ICD-10-CM | POA: Diagnosis not present

## 2023-01-22 DIAGNOSIS — Z853 Personal history of malignant neoplasm of breast: Secondary | ICD-10-CM | POA: Diagnosis not present

## 2023-01-22 DIAGNOSIS — Z7901 Long term (current) use of anticoagulants: Secondary | ICD-10-CM | POA: Diagnosis not present

## 2023-01-22 DIAGNOSIS — I10 Essential (primary) hypertension: Secondary | ICD-10-CM | POA: Diagnosis not present

## 2023-01-22 DIAGNOSIS — N179 Acute kidney failure, unspecified: Secondary | ICD-10-CM | POA: Diagnosis not present

## 2023-01-22 DIAGNOSIS — I4891 Unspecified atrial fibrillation: Secondary | ICD-10-CM | POA: Diagnosis not present

## 2023-01-22 DIAGNOSIS — B9620 Unspecified Escherichia coli [E. coli] as the cause of diseases classified elsewhere: Secondary | ICD-10-CM | POA: Diagnosis not present

## 2023-01-22 DIAGNOSIS — Z792 Long term (current) use of antibiotics: Secondary | ICD-10-CM | POA: Diagnosis not present

## 2023-01-22 LAB — CULTURE, BLOOD (ROUTINE X 2): Culture: NO GROWTH

## 2023-01-23 LAB — CULTURE, BLOOD (ROUTINE X 2): Culture: NO GROWTH

## 2023-01-24 LAB — CULTURE, BLOOD (ROUTINE X 2)

## 2023-01-25 LAB — CULTURE, BLOOD (ROUTINE X 2)

## 2023-01-27 DIAGNOSIS — B9620 Unspecified Escherichia coli [E. coli] as the cause of diseases classified elsewhere: Secondary | ICD-10-CM | POA: Diagnosis not present

## 2023-01-27 DIAGNOSIS — I4891 Unspecified atrial fibrillation: Secondary | ICD-10-CM | POA: Diagnosis not present

## 2023-01-27 DIAGNOSIS — N390 Urinary tract infection, site not specified: Secondary | ICD-10-CM | POA: Diagnosis not present

## 2023-01-27 DIAGNOSIS — M810 Age-related osteoporosis without current pathological fracture: Secondary | ICD-10-CM | POA: Diagnosis not present

## 2023-01-27 DIAGNOSIS — N179 Acute kidney failure, unspecified: Secondary | ICD-10-CM | POA: Diagnosis not present

## 2023-01-27 DIAGNOSIS — I10 Essential (primary) hypertension: Secondary | ICD-10-CM | POA: Diagnosis not present

## 2023-01-29 DIAGNOSIS — D72829 Elevated white blood cell count, unspecified: Secondary | ICD-10-CM | POA: Diagnosis not present

## 2023-01-29 DIAGNOSIS — B9620 Unspecified Escherichia coli [E. coli] as the cause of diseases classified elsewhere: Secondary | ICD-10-CM | POA: Diagnosis not present

## 2023-01-29 DIAGNOSIS — M810 Age-related osteoporosis without current pathological fracture: Secondary | ICD-10-CM | POA: Diagnosis not present

## 2023-01-29 DIAGNOSIS — I4891 Unspecified atrial fibrillation: Secondary | ICD-10-CM | POA: Diagnosis not present

## 2023-01-29 DIAGNOSIS — N390 Urinary tract infection, site not specified: Secondary | ICD-10-CM | POA: Diagnosis not present

## 2023-01-29 DIAGNOSIS — B353 Tinea pedis: Secondary | ICD-10-CM | POA: Diagnosis not present

## 2023-01-29 DIAGNOSIS — I10 Essential (primary) hypertension: Secondary | ICD-10-CM | POA: Diagnosis not present

## 2023-01-29 DIAGNOSIS — N179 Acute kidney failure, unspecified: Secondary | ICD-10-CM | POA: Diagnosis not present

## 2023-01-29 DIAGNOSIS — Z6825 Body mass index (BMI) 25.0-25.9, adult: Secondary | ICD-10-CM | POA: Diagnosis not present

## 2023-01-30 ENCOUNTER — Ambulatory Visit: Payer: Medicare Other | Admitting: Cardiology

## 2023-01-30 ENCOUNTER — Encounter: Payer: Self-pay | Admitting: Cardiology

## 2023-01-30 VITALS — BP 113/70 | HR 70 | Resp 15 | Ht 62.0 in | Wt 120.0 lb

## 2023-01-30 DIAGNOSIS — M810 Age-related osteoporosis without current pathological fracture: Secondary | ICD-10-CM | POA: Diagnosis not present

## 2023-01-30 DIAGNOSIS — I480 Paroxysmal atrial fibrillation: Secondary | ICD-10-CM

## 2023-01-30 DIAGNOSIS — I4891 Unspecified atrial fibrillation: Secondary | ICD-10-CM | POA: Diagnosis not present

## 2023-01-30 DIAGNOSIS — N390 Urinary tract infection, site not specified: Secondary | ICD-10-CM | POA: Diagnosis not present

## 2023-01-30 DIAGNOSIS — I10 Essential (primary) hypertension: Secondary | ICD-10-CM

## 2023-01-30 DIAGNOSIS — N179 Acute kidney failure, unspecified: Secondary | ICD-10-CM | POA: Diagnosis not present

## 2023-01-30 DIAGNOSIS — B9620 Unspecified Escherichia coli [E. coli] as the cause of diseases classified elsewhere: Secondary | ICD-10-CM | POA: Diagnosis not present

## 2023-01-30 MED ORDER — DILTIAZEM HCL ER COATED BEADS 120 MG PO CP24: 120.0000 mg | ORAL_CAPSULE | Freq: Every day | ORAL | 3 refills | Status: DC

## 2023-01-30 MED ORDER — METOPROLOL SUCCINATE ER 25 MG PO TB24: 25.0000 mg | ORAL_TABLET | Freq: Every day | ORAL | 3 refills | Status: DC

## 2023-01-30 MED ORDER — HYDROCHLOROTHIAZIDE 12.5 MG PO TABS: 12.5000 mg | ORAL_TABLET | Freq: Every day | ORAL | 3 refills | Status: DC

## 2023-01-30 NOTE — Progress Notes (Signed)
Follow up visit  Subjective:   Gina Mcgee, female    DOB: November 07, 1929, 89 y.o.   MRN: [id number]   HPI  Chief Complaint  Patient presents with   Paroxysmal atrial fibrillation    Hospitalization Follow-up    89 y.o. Caucasian female with hypertension, PAF, mild dementia, h/o breast cancer, recurrent UTI.     Patient was hospitalized twice in the past 1 month with recurrent UTI.  During her first admission, she did have A-fib RVR resolved on its own.  She has had no recurrent A-fib, palpitation symptoms.  Blood pressures generally in normal range.  Occasional systolic blood pressure in low 100s associated with lightheadedness.  Patient does not drink water, drinks about 4 to 6 cups of hot tea every day.  Reviewed recent test results with the patient, details below.      Current Outpatient Medications:    apixaban (ELIQUIS) 2.5 MG TABS tablet, Take 1 tablet (2.5 mg total) by mouth 2 (two) times daily., Disp: 90 tablet, Rfl: 3   Cholecalciferol (VITAMIN D3) 10 MCG (400 UNIT) CAPS, Take 400 Units by mouth in the morning, at noon, in the evening, and at bedtime., Disp: , Rfl:    cyanocobalamin (VITAMIN B12) 1000 MCG/ML injection, Inject into the muscle every 30 (thirty) days., Disp: , Rfl:    diltiazem (CARDIZEM CD) 120 MG 24 hr capsule, Take 1 capsule (120 mg total) by mouth daily., Disp: 30 capsule, Rfl: 1   ferrous sulfate 325 (65 FE) MG tablet, Take 1 tablet (325 mg total) by mouth every 3 (three) days., Disp: , Rfl: 3   hydrochlorothiazide (HYDRODIURIL) 12.5 MG tablet, Take 12.5 mg by mouth daily., Disp: , Rfl:    ketoconazole (NIZORAL) 2 % cream, Apply 1 Application topically at bedtime. Apply to toes, Disp: , Rfl:    memantine (NAMENDA) 10 MG tablet, Take 10 mg by mouth 2 (two) times daily., Disp: , Rfl:    metoprolol succinate (TOPROL-XL) 25 MG 24 hr tablet, TAKE 1 TABLET(25 MG) BY MOUTH DAILY (Patient taking differently: Take 25 mg by mouth daily.), Disp: 90 tablet, Rfl:  3   Multiple Vitamins-Minerals (MULTIVITAMIN WITH MINERALS) tablet, Take 1 tablet by mouth daily., Disp: , Rfl:    senna-docusate (SENOKOT-S) 8.6-50 MG tablet, Take 1 tablet by mouth daily., Disp: , Rfl:    Cardiovascular & other pertient studies:  Reviewed external labs and tests, independently interpreted  EKG 01/30/2023: Probable sinus rhythm with PACs Low voltage in precordial leads Poor R-wave progression Nonspecific T-abnormality    Echocardiogram 08/13/2022:  1. Limited echo for edema   2. Left ventricular ejection fraction, by estimation, is 60 to 65%. Left  ventricular ejection fraction by PLAX is 65 %. The left ventricle has  normal function. The left ventricle has no regional wall motion  abnormalities. Left ventricular diastolic  parameters are consistent with Grade I diastolic dysfunction (impaired  relaxation).   3. The mitral valve is abnormal. Trivial mitral valve regurgitation.   4. Aortic valve regurgitation is mild.   Comparison(s): Changes from prior study are noted. 11/07/2021: LVEF 60-65%,  mild RV systolic dysfunction, mild AI and MR.  Recent labs: 01/21/2023: Glucose 101, BUN/Cr 29/0.95. EGFR 57. Na/K 132/3.6. Albumin 3.0. Protein 6.2. Rest of the CMP normal H/H 9.4/31.5. MCV 87. Platelets 258 HbA1C NA   Review of Systems  Cardiovascular:  Negative for chest pain, dyspnea on exertion, leg swelling, palpitations and syncope.  Neurological:  Positive for light-headedness (Occasional).  Vitals:   01/30/23 1453  BP: 113/70  Pulse: 70  Resp: 15  SpO2: 94%    Body mass index is 21.95 kg/m. Filed Weights   01/30/23 1453  Weight: 120 lb (54.4 kg)     Objective:   Physical Exam Vitals and nursing note reviewed.  Constitutional:      General: She is not in acute distress. Neck:     Vascular: No JVD.  Cardiovascular:     Rate and Rhythm: Normal rate and regular rhythm.     Heart sounds: Normal heart sounds. No murmur  heard. Pulmonary:     Effort: Pulmonary effort is normal.     Breath sounds: Normal breath sounds. No wheezing or rales.  Musculoskeletal:     Right lower leg: No edema.     Left lower leg: No edema.             Visit diagnoses:   ICD-10-CM   1. Paroxysmal atrial fibrillation (HCC)  I48.0 EKG 12-Lead    2. Primary hypertension  I10        Orders Placed This Encounter  Procedures   EKG 12-Lead     Medication changes this visit: Medications Discontinued During This Encounter  Medication Reason   metoprolol succinate (TOPROL-XL) 25 MG 24 hr tablet Reorder   diltiazem (CARDIZEM CD) 120 MG 24 hr capsule Reorder   hydrochlorothiazide (HYDRODIURIL) 12.5 MG tablet Reorder    Meds ordered this encounter  Medications   metoprolol succinate (TOPROL-XL) 25 MG 24 hr tablet    Sig: Take 1 tablet (25 mg total) by mouth daily.    Dispense:  90 tablet    Refill:  3   hydrochlorothiazide (HYDRODIURIL) 12.5 MG tablet    Sig: Take 1 tablet (12.5 mg total) by mouth daily.    Dispense:  90 tablet    Refill:  3   diltiazem (CARDIZEM CD) 120 MG 24 hr capsule    Sig: Take 1 capsule (120 mg total) by mouth daily.    Dispense:  90 capsule    Refill:  3     Assessment & Recommendations:   89 y.o. Caucasian female with hypertension, PAF, mild dementia, h/o breast cancer, recurrent UTI.     Exertional dyspnea:  Likely multifactorial including advanced age, deconditioning, scoliosis etc.  PAF: Maintaining sinus rhythm.   Continue metoprolol, diltiazem, Eliquis.  Hypertension: Occasional lightheadedness episode, should improve with increased water intake. She has low sodium, possibly due to side effect of hydrochlorothiazide.  This has been present for as much as 10 years.  Reasonable to continue the same.  F/u in 3 months      Elder Negus, MD Pager: [phone] Office: [phone]

## 2023-02-03 DIAGNOSIS — B9620 Unspecified Escherichia coli [E. coli] as the cause of diseases classified elsewhere: Secondary | ICD-10-CM | POA: Diagnosis not present

## 2023-02-03 DIAGNOSIS — R8290 Unspecified abnormal findings in urine: Secondary | ICD-10-CM | POA: Diagnosis not present

## 2023-02-03 DIAGNOSIS — I4891 Unspecified atrial fibrillation: Secondary | ICD-10-CM | POA: Diagnosis not present

## 2023-02-03 DIAGNOSIS — N390 Urinary tract infection, site not specified: Secondary | ICD-10-CM | POA: Diagnosis not present

## 2023-02-03 DIAGNOSIS — M810 Age-related osteoporosis without current pathological fracture: Secondary | ICD-10-CM | POA: Diagnosis not present

## 2023-02-03 DIAGNOSIS — I10 Essential (primary) hypertension: Secondary | ICD-10-CM | POA: Diagnosis not present

## 2023-02-03 DIAGNOSIS — N179 Acute kidney failure, unspecified: Secondary | ICD-10-CM | POA: Diagnosis not present

## 2023-02-05 DIAGNOSIS — N179 Acute kidney failure, unspecified: Secondary | ICD-10-CM | POA: Diagnosis not present

## 2023-02-05 DIAGNOSIS — M810 Age-related osteoporosis without current pathological fracture: Secondary | ICD-10-CM | POA: Diagnosis not present

## 2023-02-05 DIAGNOSIS — B9620 Unspecified Escherichia coli [E. coli] as the cause of diseases classified elsewhere: Secondary | ICD-10-CM | POA: Diagnosis not present

## 2023-02-05 DIAGNOSIS — I10 Essential (primary) hypertension: Secondary | ICD-10-CM | POA: Diagnosis not present

## 2023-02-05 DIAGNOSIS — N390 Urinary tract infection, site not specified: Secondary | ICD-10-CM | POA: Diagnosis not present

## 2023-02-05 DIAGNOSIS — I4891 Unspecified atrial fibrillation: Secondary | ICD-10-CM | POA: Diagnosis not present

## 2023-02-09 ENCOUNTER — Other Ambulatory Visit (HOSPITAL_COMMUNITY): Payer: Self-pay

## 2023-02-10 DIAGNOSIS — I4891 Unspecified atrial fibrillation: Secondary | ICD-10-CM | POA: Diagnosis not present

## 2023-02-10 DIAGNOSIS — B9620 Unspecified Escherichia coli [E. coli] as the cause of diseases classified elsewhere: Secondary | ICD-10-CM | POA: Diagnosis not present

## 2023-02-10 DIAGNOSIS — N179 Acute kidney failure, unspecified: Secondary | ICD-10-CM | POA: Diagnosis not present

## 2023-02-10 DIAGNOSIS — M810 Age-related osteoporosis without current pathological fracture: Secondary | ICD-10-CM | POA: Diagnosis not present

## 2023-02-10 DIAGNOSIS — I10 Essential (primary) hypertension: Secondary | ICD-10-CM | POA: Diagnosis not present

## 2023-02-10 DIAGNOSIS — N390 Urinary tract infection, site not specified: Secondary | ICD-10-CM | POA: Diagnosis not present

## 2023-02-12 DIAGNOSIS — N179 Acute kidney failure, unspecified: Secondary | ICD-10-CM | POA: Diagnosis not present

## 2023-02-12 DIAGNOSIS — N390 Urinary tract infection, site not specified: Secondary | ICD-10-CM | POA: Diagnosis not present

## 2023-02-12 DIAGNOSIS — I4891 Unspecified atrial fibrillation: Secondary | ICD-10-CM | POA: Diagnosis not present

## 2023-02-12 DIAGNOSIS — I10 Essential (primary) hypertension: Secondary | ICD-10-CM | POA: Diagnosis not present

## 2023-02-12 DIAGNOSIS — B9620 Unspecified Escherichia coli [E. coli] as the cause of diseases classified elsewhere: Secondary | ICD-10-CM | POA: Diagnosis not present

## 2023-02-12 DIAGNOSIS — M810 Age-related osteoporosis without current pathological fracture: Secondary | ICD-10-CM | POA: Diagnosis not present

## 2023-02-17 DIAGNOSIS — N390 Urinary tract infection, site not specified: Secondary | ICD-10-CM | POA: Diagnosis not present

## 2023-02-17 DIAGNOSIS — I10 Essential (primary) hypertension: Secondary | ICD-10-CM | POA: Diagnosis not present

## 2023-02-17 DIAGNOSIS — M810 Age-related osteoporosis without current pathological fracture: Secondary | ICD-10-CM | POA: Diagnosis not present

## 2023-02-17 DIAGNOSIS — B9620 Unspecified Escherichia coli [E. coli] as the cause of diseases classified elsewhere: Secondary | ICD-10-CM | POA: Diagnosis not present

## 2023-02-17 DIAGNOSIS — I4891 Unspecified atrial fibrillation: Secondary | ICD-10-CM | POA: Diagnosis not present

## 2023-02-17 DIAGNOSIS — N179 Acute kidney failure, unspecified: Secondary | ICD-10-CM | POA: Diagnosis not present

## 2023-02-21 ENCOUNTER — Other Ambulatory Visit: Payer: Self-pay | Admitting: Internal Medicine

## 2023-02-21 DIAGNOSIS — I4891 Unspecified atrial fibrillation: Secondary | ICD-10-CM | POA: Diagnosis not present

## 2023-02-21 DIAGNOSIS — N179 Acute kidney failure, unspecified: Secondary | ICD-10-CM | POA: Diagnosis not present

## 2023-02-21 DIAGNOSIS — Z853 Personal history of malignant neoplasm of breast: Secondary | ICD-10-CM | POA: Diagnosis not present

## 2023-02-21 DIAGNOSIS — Z8551 Personal history of malignant neoplasm of bladder: Secondary | ICD-10-CM | POA: Diagnosis not present

## 2023-02-21 DIAGNOSIS — Z7901 Long term (current) use of anticoagulants: Secondary | ICD-10-CM | POA: Diagnosis not present

## 2023-02-21 DIAGNOSIS — I10 Essential (primary) hypertension: Secondary | ICD-10-CM | POA: Diagnosis not present

## 2023-02-21 DIAGNOSIS — B9620 Unspecified Escherichia coli [E. coli] as the cause of diseases classified elsewhere: Secondary | ICD-10-CM | POA: Diagnosis not present

## 2023-02-21 DIAGNOSIS — N390 Urinary tract infection, site not specified: Secondary | ICD-10-CM | POA: Diagnosis not present

## 2023-02-21 DIAGNOSIS — M810 Age-related osteoporosis without current pathological fracture: Secondary | ICD-10-CM | POA: Diagnosis not present

## 2023-02-21 DIAGNOSIS — Z792 Long term (current) use of antibiotics: Secondary | ICD-10-CM | POA: Diagnosis not present

## 2023-02-25 DIAGNOSIS — M810 Age-related osteoporosis without current pathological fracture: Secondary | ICD-10-CM | POA: Diagnosis not present

## 2023-02-25 DIAGNOSIS — I10 Essential (primary) hypertension: Secondary | ICD-10-CM | POA: Diagnosis not present

## 2023-02-25 DIAGNOSIS — N390 Urinary tract infection, site not specified: Secondary | ICD-10-CM | POA: Diagnosis not present

## 2023-02-25 DIAGNOSIS — B9620 Unspecified Escherichia coli [E. coli] as the cause of diseases classified elsewhere: Secondary | ICD-10-CM | POA: Diagnosis not present

## 2023-02-25 DIAGNOSIS — I4891 Unspecified atrial fibrillation: Secondary | ICD-10-CM | POA: Diagnosis not present

## 2023-02-25 DIAGNOSIS — N179 Acute kidney failure, unspecified: Secondary | ICD-10-CM | POA: Diagnosis not present

## 2023-03-03 DIAGNOSIS — I4891 Unspecified atrial fibrillation: Secondary | ICD-10-CM | POA: Diagnosis not present

## 2023-03-03 DIAGNOSIS — N390 Urinary tract infection, site not specified: Secondary | ICD-10-CM | POA: Diagnosis not present

## 2023-03-03 DIAGNOSIS — B9620 Unspecified Escherichia coli [E. coli] as the cause of diseases classified elsewhere: Secondary | ICD-10-CM | POA: Diagnosis not present

## 2023-03-03 DIAGNOSIS — M810 Age-related osteoporosis without current pathological fracture: Secondary | ICD-10-CM | POA: Diagnosis not present

## 2023-03-03 DIAGNOSIS — N179 Acute kidney failure, unspecified: Secondary | ICD-10-CM | POA: Diagnosis not present

## 2023-03-03 DIAGNOSIS — I10 Essential (primary) hypertension: Secondary | ICD-10-CM | POA: Diagnosis not present

## 2023-03-10 DIAGNOSIS — I4891 Unspecified atrial fibrillation: Secondary | ICD-10-CM | POA: Diagnosis not present

## 2023-03-10 DIAGNOSIS — N390 Urinary tract infection, site not specified: Secondary | ICD-10-CM | POA: Diagnosis not present

## 2023-03-10 DIAGNOSIS — B9620 Unspecified Escherichia coli [E. coli] as the cause of diseases classified elsewhere: Secondary | ICD-10-CM | POA: Diagnosis not present

## 2023-03-10 DIAGNOSIS — I10 Essential (primary) hypertension: Secondary | ICD-10-CM | POA: Diagnosis not present

## 2023-03-10 DIAGNOSIS — N179 Acute kidney failure, unspecified: Secondary | ICD-10-CM | POA: Diagnosis not present

## 2023-03-10 DIAGNOSIS — M810 Age-related osteoporosis without current pathological fracture: Secondary | ICD-10-CM | POA: Diagnosis not present

## 2023-03-11 DIAGNOSIS — G3184 Mild cognitive impairment, so stated: Secondary | ICD-10-CM | POA: Diagnosis not present

## 2023-03-11 DIAGNOSIS — I4891 Unspecified atrial fibrillation: Secondary | ICD-10-CM | POA: Diagnosis not present

## 2023-03-11 DIAGNOSIS — I480 Paroxysmal atrial fibrillation: Secondary | ICD-10-CM | POA: Diagnosis not present

## 2023-03-11 DIAGNOSIS — I10 Essential (primary) hypertension: Secondary | ICD-10-CM | POA: Diagnosis not present

## 2023-03-11 DIAGNOSIS — Z6825 Body mass index (BMI) 25.0-25.9, adult: Secondary | ICD-10-CM | POA: Diagnosis not present

## 2023-03-11 DIAGNOSIS — C679 Malignant neoplasm of bladder, unspecified: Secondary | ICD-10-CM | POA: Diagnosis not present

## 2023-03-11 DIAGNOSIS — E538 Deficiency of other specified B group vitamins: Secondary | ICD-10-CM | POA: Diagnosis not present

## 2023-03-11 DIAGNOSIS — D508 Other iron deficiency anemias: Secondary | ICD-10-CM | POA: Diagnosis not present

## 2023-03-11 DIAGNOSIS — M810 Age-related osteoporosis without current pathological fracture: Secondary | ICD-10-CM | POA: Diagnosis not present

## 2023-03-11 DIAGNOSIS — R29898 Other symptoms and signs involving the musculoskeletal system: Secondary | ICD-10-CM | POA: Diagnosis not present

## 2023-03-11 DIAGNOSIS — R296 Repeated falls: Secondary | ICD-10-CM | POA: Diagnosis not present

## 2023-03-11 DIAGNOSIS — J387 Other diseases of larynx: Secondary | ICD-10-CM | POA: Diagnosis not present

## 2023-03-13 DIAGNOSIS — N390 Urinary tract infection, site not specified: Secondary | ICD-10-CM | POA: Diagnosis not present

## 2023-03-13 DIAGNOSIS — M810 Age-related osteoporosis without current pathological fracture: Secondary | ICD-10-CM | POA: Diagnosis not present

## 2023-03-13 DIAGNOSIS — N179 Acute kidney failure, unspecified: Secondary | ICD-10-CM | POA: Diagnosis not present

## 2023-03-13 DIAGNOSIS — I10 Essential (primary) hypertension: Secondary | ICD-10-CM | POA: Diagnosis not present

## 2023-03-13 DIAGNOSIS — B9620 Unspecified Escherichia coli [E. coli] as the cause of diseases classified elsewhere: Secondary | ICD-10-CM | POA: Diagnosis not present

## 2023-03-13 DIAGNOSIS — I4891 Unspecified atrial fibrillation: Secondary | ICD-10-CM | POA: Diagnosis not present

## 2023-03-20 DIAGNOSIS — I10 Essential (primary) hypertension: Secondary | ICD-10-CM | POA: Diagnosis not present

## 2023-03-20 DIAGNOSIS — I4891 Unspecified atrial fibrillation: Secondary | ICD-10-CM | POA: Diagnosis not present

## 2023-03-20 DIAGNOSIS — N390 Urinary tract infection, site not specified: Secondary | ICD-10-CM | POA: Diagnosis not present

## 2023-03-20 DIAGNOSIS — M810 Age-related osteoporosis without current pathological fracture: Secondary | ICD-10-CM | POA: Diagnosis not present

## 2023-03-20 DIAGNOSIS — N179 Acute kidney failure, unspecified: Secondary | ICD-10-CM | POA: Diagnosis not present

## 2023-03-20 DIAGNOSIS — B9620 Unspecified Escherichia coli [E. coli] as the cause of diseases classified elsewhere: Secondary | ICD-10-CM | POA: Diagnosis not present

## 2023-03-24 DIAGNOSIS — R8290 Unspecified abnormal findings in urine: Secondary | ICD-10-CM | POA: Diagnosis not present

## 2023-03-27 DIAGNOSIS — G3184 Mild cognitive impairment, so stated: Secondary | ICD-10-CM | POA: Diagnosis not present

## 2023-04-07 DIAGNOSIS — C672 Malignant neoplasm of lateral wall of bladder: Secondary | ICD-10-CM | POA: Diagnosis not present

## 2023-04-07 DIAGNOSIS — C679 Malignant neoplasm of bladder, unspecified: Secondary | ICD-10-CM | POA: Diagnosis not present

## 2023-04-16 DIAGNOSIS — E538 Deficiency of other specified B group vitamins: Secondary | ICD-10-CM | POA: Diagnosis not present

## 2023-04-17 ENCOUNTER — Encounter (HOSPITAL_BASED_OUTPATIENT_CLINIC_OR_DEPARTMENT_OTHER): Payer: Self-pay

## 2023-04-17 ENCOUNTER — Emergency Department (HOSPITAL_BASED_OUTPATIENT_CLINIC_OR_DEPARTMENT_OTHER): Payer: Medicare Other

## 2023-04-17 ENCOUNTER — Other Ambulatory Visit: Payer: Self-pay

## 2023-04-17 ENCOUNTER — Emergency Department (HOSPITAL_BASED_OUTPATIENT_CLINIC_OR_DEPARTMENT_OTHER)
Admission: EM | Admit: 2023-04-17 | Discharge: 2023-04-17 | Disposition: A | Discharge status: Home / Self Care | Payer: Medicare Other | Source: Home / Self Care | Attending: Emergency Medicine | Admitting: Emergency Medicine

## 2023-04-17 DIAGNOSIS — J9811 Atelectasis: Secondary | ICD-10-CM | POA: Diagnosis not present

## 2023-04-17 DIAGNOSIS — J90 Pleural effusion, not elsewhere classified: Secondary | ICD-10-CM | POA: Diagnosis not present

## 2023-04-17 DIAGNOSIS — Z7901 Long term (current) use of anticoagulants: Secondary | ICD-10-CM | POA: Diagnosis not present

## 2023-04-17 DIAGNOSIS — I959 Hypotension, unspecified: Secondary | ICD-10-CM | POA: Diagnosis not present

## 2023-04-17 DIAGNOSIS — U071 COVID-19: Secondary | ICD-10-CM | POA: Diagnosis not present

## 2023-04-17 DIAGNOSIS — R509 Fever, unspecified: Secondary | ICD-10-CM | POA: Diagnosis not present

## 2023-04-17 DIAGNOSIS — K449 Diaphragmatic hernia without obstruction or gangrene: Secondary | ICD-10-CM | POA: Diagnosis not present

## 2023-04-17 DIAGNOSIS — Z853 Personal history of malignant neoplasm of breast: Secondary | ICD-10-CM | POA: Insufficient documentation

## 2023-04-17 DIAGNOSIS — Z8616 Personal history of COVID-19: Secondary | ICD-10-CM | POA: Diagnosis not present

## 2023-04-17 DIAGNOSIS — R059 Cough, unspecified: Secondary | ICD-10-CM | POA: Diagnosis not present

## 2023-04-17 DIAGNOSIS — F0390 Unspecified dementia without behavioral disturbance: Secondary | ICD-10-CM | POA: Diagnosis not present

## 2023-04-17 DIAGNOSIS — Z881 Allergy status to other antibiotic agents status: Secondary | ICD-10-CM | POA: Diagnosis not present

## 2023-04-17 DIAGNOSIS — I129 Hypertensive chronic kidney disease with stage 1 through stage 4 chronic kidney disease, or unspecified chronic kidney disease: Secondary | ICD-10-CM | POA: Diagnosis not present

## 2023-04-17 DIAGNOSIS — Z801 Family history of malignant neoplasm of trachea, bronchus and lung: Secondary | ICD-10-CM | POA: Diagnosis not present

## 2023-04-17 DIAGNOSIS — R0902 Hypoxemia: Secondary | ICD-10-CM | POA: Diagnosis not present

## 2023-04-17 DIAGNOSIS — Z886 Allergy status to analgesic agent status: Secondary | ICD-10-CM | POA: Diagnosis not present

## 2023-04-17 DIAGNOSIS — D631 Anemia in chronic kidney disease: Secondary | ICD-10-CM | POA: Diagnosis not present

## 2023-04-17 DIAGNOSIS — N1832 Chronic kidney disease, stage 3b: Secondary | ICD-10-CM | POA: Diagnosis not present

## 2023-04-17 DIAGNOSIS — Z66 Do not resuscitate: Secondary | ICD-10-CM | POA: Diagnosis not present

## 2023-04-17 DIAGNOSIS — Z882 Allergy status to sulfonamides status: Secondary | ICD-10-CM | POA: Diagnosis not present

## 2023-04-17 DIAGNOSIS — E871 Hypo-osmolality and hyponatremia: Secondary | ICD-10-CM | POA: Diagnosis not present

## 2023-04-17 DIAGNOSIS — R0989 Other specified symptoms and signs involving the circulatory and respiratory systems: Secondary | ICD-10-CM | POA: Diagnosis not present

## 2023-04-17 DIAGNOSIS — I4891 Unspecified atrial fibrillation: Secondary | ICD-10-CM | POA: Diagnosis not present

## 2023-04-17 DIAGNOSIS — Z79899 Other long term (current) drug therapy: Secondary | ICD-10-CM | POA: Diagnosis not present

## 2023-04-17 DIAGNOSIS — J984 Other disorders of lung: Secondary | ICD-10-CM | POA: Diagnosis not present

## 2023-04-17 DIAGNOSIS — J1282 Pneumonia due to coronavirus disease 2019: Secondary | ICD-10-CM | POA: Diagnosis not present

## 2023-04-17 DIAGNOSIS — R0603 Acute respiratory distress: Secondary | ICD-10-CM | POA: Diagnosis not present

## 2023-04-17 DIAGNOSIS — M810 Age-related osteoporosis without current pathological fracture: Secondary | ICD-10-CM | POA: Diagnosis present

## 2023-04-17 DIAGNOSIS — N179 Acute kidney failure, unspecified: Secondary | ICD-10-CM | POA: Diagnosis not present

## 2023-04-17 DIAGNOSIS — R0602 Shortness of breath: Secondary | ICD-10-CM | POA: Diagnosis not present

## 2023-04-17 DIAGNOSIS — I10 Essential (primary) hypertension: Secondary | ICD-10-CM | POA: Insufficient documentation

## 2023-04-17 DIAGNOSIS — J9601 Acute respiratory failure with hypoxia: Secondary | ICD-10-CM | POA: Diagnosis not present

## 2023-04-17 DIAGNOSIS — Z91013 Allergy to seafood: Secondary | ICD-10-CM | POA: Diagnosis not present

## 2023-04-17 DIAGNOSIS — E876 Hypokalemia: Secondary | ICD-10-CM | POA: Diagnosis present

## 2023-04-17 DIAGNOSIS — R918 Other nonspecific abnormal finding of lung field: Secondary | ICD-10-CM | POA: Diagnosis not present

## 2023-04-17 DIAGNOSIS — R531 Weakness: Secondary | ICD-10-CM | POA: Diagnosis not present

## 2023-04-17 DIAGNOSIS — Z823 Family history of stroke: Secondary | ICD-10-CM | POA: Diagnosis not present

## 2023-04-17 LAB — BASIC METABOLIC PANEL
Anion gap: 10 (ref 5–15)
BUN: 27 mg/dL — ABNORMAL HIGH (ref 8–23)
CO2: 29 mmol/L (ref 22–32)
Calcium: 9.6 mg/dL (ref 8.9–10.3)
Chloride: 96 mmol/L — ABNORMAL LOW (ref 98–111)
Creatinine, Ser: 1.01 mg/dL — ABNORMAL HIGH (ref 0.44–1.00)
GFR, Estimated: 53 mL/min — ABNORMAL LOW (ref >60)
Glucose, Bld: 147 mg/dL — ABNORMAL HIGH (ref 70–99)
Potassium: 3.3 mmol/L — ABNORMAL LOW (ref 3.5–5.1)
Sodium: 135 mmol/L (ref 135–145)

## 2023-04-17 LAB — CBC WITH DIFFERENTIAL/PLATELET
Abs Immature Granulocytes: 0.05 10*3/uL (ref 0.00–0.07)
Basophils Absolute: 0 10*3/uL (ref 0.0–0.1)
Basophils Relative: 0 %
Eosinophils Absolute: 0 10*3/uL (ref 0.0–0.5)
Eosinophils Relative: 0 %
HCT: 38 % (ref 36.0–46.0)
Hemoglobin: 12.5 g/dL (ref 12.0–15.0)
Immature Granulocytes: 0 %
Lymphocytes Relative: 4 %
Lymphs Abs: 0.5 10*3/uL — ABNORMAL LOW (ref 0.7–4.0)
MCH: 28.5 pg (ref 26.0–34.0)
MCHC: 32.9 g/dL (ref 30.0–36.0)
MCV: 86.8 fL (ref 80.0–100.0)
Monocytes Absolute: 0.7 10*3/uL (ref 0.1–1.0)
Monocytes Relative: 6 %
Neutro Abs: 11.3 10*3/uL — ABNORMAL HIGH (ref 1.7–7.7)
Neutrophils Relative %: 90 %
Platelets: 236 10*3/uL (ref 150–400)
RBC: 4.38 MIL/uL (ref 3.87–5.11)
RDW: 15.9 % — ABNORMAL HIGH (ref 11.5–15.5)
WBC: 12.6 10*3/uL — ABNORMAL HIGH (ref 4.0–10.5)
nRBC: 0 % (ref 0.0–0.2)

## 2023-04-17 LAB — SARS CORONAVIRUS 2 BY RT PCR: SARS Coronavirus 2 by RT PCR: POSITIVE — AB

## 2023-04-17 MED ORDER — PAXLOVID (300/100) 20 X 150 MG & 10 X 100MG PO TBPK: 3.0000 | ORAL_TABLET | Freq: Two times a day (BID) | ORAL | 0 refills | Status: DC

## 2023-04-17 MED ORDER — BENZONATATE 100 MG PO CAPS: 100.0000 mg | ORAL_CAPSULE | Freq: Three times a day (TID) | ORAL | 0 refills | Status: DC

## 2023-04-17 MED ORDER — PAXLOVID (300/100) 20 X 150 MG & 10 X 100MG PO TBPK
3.0000 | ORAL_TABLET | Freq: Two times a day (BID) | ORAL | 0 refills | Status: DC
Start: 2023-04-17 — End: 2023-04-17

## 2023-04-17 MED ORDER — POTASSIUM CHLORIDE CRYS ER 20 MEQ PO TBCR
40.0000 meq | EXTENDED_RELEASE_TABLET | Freq: Once | ORAL | Status: AC
  Administered 2023-04-17: 40 meq via ORAL
  Filled 2023-04-17: qty 2

## 2023-04-17 NOTE — ED Triage Notes (Addendum)
Pt presents with complaints of a cough & low grade fever at home. Daughter tested positive for COVID last week. + home test.

## 2023-04-17 NOTE — ED Provider Notes (Signed)
[place] EMERGENCY DEPARTMENT AT [facility] Provider Note   CSN: [id number] Arrival date & time: 04/17/23  1800     History  Chief Complaint  Patient presents with   Cough    Gina Mcgee is a 89 y.o. female past with history of A-fib on Eliquis, breast cancer, hypertension presents today for evaluation of cough and fever.  Per family at bedside patient has had cough for about a week.  She tested negative for COVID on Monday however today her COVID test is positive.  She reports cold/chills at home.  Her temperature earlier today was 99.2.  Denies runny nose, congestion, headache, dizziness, chest pain, shortness of breath, bowel changes, urinary symptoms.   Cough     Past Medical History:  Diagnosis Date   A-fib [facility])    Brain concussion 09/22/1958   Cancer [facility])    left-sided breast cancer   Concussion 09/22/1958   with left-sided neuro defecits   HTN (hypertension)    Osteoporosis    Past Surgical History:  Procedure Laterality Date   BOWEL RESECTION  approx. in 2007   polyp and formed stricture   BREAST LUMPECTOMY  1981   left lumpectomy w/ radiation   COLONOSCOPY W/ POLYPECTOMY     FEMUR SURGERY  2011   left femur pinning   JOINT REPLACEMENT     ORIF HUMERUS FRACTURE Left 01/25/2013   Procedure: OPEN REDUCTION INTERNAL FIXATION (ORIF) DISTAL HUMERUS FRACTURE REPAIR RECONSTRUCTION AND ULNA NERVE DECOMPRESSION AND ANTERIOR TRANSPOSITION AS NECESSARY;  Surgeon: Dominica Severin, MD;  Location: [facility] OR;  Service: Orthopedics;  Laterality: Left;     Home Medications Prior to Admission medications   Medication Sig Start Date End Date Taking? Authorizing Provider  apixaban (ELIQUIS) 2.5 MG TABS tablet Take 1 tablet (2.5 mg total) by mouth 2 (two) times daily. 12/10/22   [name], [name], MD  benzonatate (TESSALON) 100 MG capsule Take 1 capsule (100 mg total) by mouth every 8 (eight) hours. 04/17/23   Jeanelle Malling, PA  Cholecalciferol (VITAMIN D3) 10 MCG (400  UNIT) CAPS Take 400 Units by mouth in the morning, at noon, in the evening, and at bedtime.    [provider]  cyanocobalamin (VITAMIN B12) 1000 MCG/ML injection Inject into the muscle every 30 (thirty) days.    [provider]  diltiazem (CARDIZEM CD) 120 MG 24 hr capsule Take 1 capsule (120 mg total) by mouth daily. 01/30/23   [name], [name], MD  ferrous sulfate 325 (65 FE) MG tablet Take 1 tablet (325 mg total) by mouth every 3 (three) days. 01/02/23   Rodolph Bong, MD  hydrochlorothiazide (HYDRODIURIL) 12.5 MG tablet Take 1 tablet (12.5 mg total) by mouth daily. 01/30/23   [name], [name], MD  ketoconazole (NIZORAL) 2 % cream Apply 1 Application topically at bedtime. Apply to toes    [provider]  memantine (NAMENDA) 10 MG tablet Take 10 mg by mouth 2 (two) times daily.    [provider]  metoprolol succinate (TOPROL-XL) 25 MG 24 hr tablet Take 1 tablet (25 mg total) by mouth daily. 01/30/23   [name], [name], MD  Multiple Vitamins-Minerals (MULTIVITAMIN WITH MINERALS) tablet Take 1 tablet by mouth daily.    [provider]  nirmatrelvir & ritonavir (PAXLOVID, 300/100,) 20 x 150 MG & 10 x 100MG  TBPK Take 3 tablets by mouth 2 (two) times daily for 5 days. 04/17/23 04/22/23  Jeanelle Malling, PA  senna-docusate (SENOKOT-S) 8.6-50 MG tablet Take  1 tablet by mouth daily.    [provider]      Allergies    Aspirin, Doxycycline, Fish allergy, Nsaids, Shellfish-derived products, Aloe, and Sulfa antibiotics    Review of Systems   Review of Systems  Respiratory:  Positive for cough.     Physical Exam Updated Vital Signs BP 134/72   Pulse 94   Temp 98.9 F (37.2 C) (Oral)   Resp 18   SpO2 92%  Physical Exam Vitals and nursing note reviewed.  Constitutional:      Appearance: Normal appearance.  HENT:     Head: Normocephalic and atraumatic.     Mouth/Throat:     Mouth: Mucous membranes are moist.  Eyes:     General: No  scleral icterus. Cardiovascular:     Rate and Rhythm: Normal rate and regular rhythm.     Pulses: Normal pulses.     Heart sounds: Normal heart sounds.  Pulmonary:     Effort: Pulmonary effort is normal.     Breath sounds: Normal breath sounds.  Abdominal:     General: Abdomen is flat.     Palpations: Abdomen is soft.     Tenderness: There is no abdominal tenderness.  Musculoskeletal:        General: No deformity.  Skin:    General: Skin is warm.     Findings: No rash.  Neurological:     General: No focal deficit present.     Mental Status: She is alert.  Psychiatric:        Mood and Affect: Mood normal.     ED Results / Procedures / Treatments   Labs (all labs ordered are listed, but only abnormal results are displayed) Labs Reviewed  SARS CORONAVIRUS 2 BY RT PCR - Abnormal; Notable for the following components:      Result Value   SARS Coronavirus 2 by RT PCR POSITIVE (*)    All other components within normal limits  CBC WITH DIFFERENTIAL/PLATELET - Abnormal; Notable for the following components:   WBC 12.6 (*)    RDW 15.9 (*)    Neutro Abs 11.3 (*)    Lymphs Abs 0.5 (*)    All other components within normal limits  BASIC METABOLIC PANEL - Abnormal; Notable for the following components:   Potassium 3.3 (*)    Chloride 96 (*)    Glucose, Bld 147 (*)    BUN 27 (*)    Creatinine, Ser 1.01 (*)    GFR, Estimated 53 (*)    All other components within normal limits    EKG None  Radiology DG Chest Portable 1 View  Result Date: 04/17/2023 CLINICAL DATA:  Cough, fever, recent positive COVID test EXAM: PORTABLE CHEST 1 VIEW COMPARISON:  01/19/2023 FINDINGS: Transverse diameter of heart is increased. Apparent shift of mediastinum to the left may be due to rotation. There is a hiatal hernia in retrocardiac region. There are no signs of pulmonary edema or focal pulmonary consolidation. Posterolateral aspect of left fifth and sixth ribs are indistinct. IMPRESSION:  Cardiomegaly. There are no signs of pulmonary edema or focal pulmonary consolidation. Hiatal hernia. Posterolateral aspect of left fifth and sixth ribs are indistinct. Follow-up routine radiographs of the left ribs may be considered for further evaluation. Electronically Signed   By: Ernie Avena M.D.   On: 04/17/2023 19:03    Procedures Procedures    Medications Ordered in ED Medications  potassium chloride SA (KLOR-CON M) CR tablet 40 mEq (has no administration  in time range)    ED Course/ Medical Decision Making/ A&P                             Medical Decision Making Amount and/or Complexity of Data Reviewed Radiology: ordered.   This patient presents to the ED for sore throat, body aches, this involves an extensive number of treatment options, and is a complaint that carries with a high risk of complications and morbidity.  The differential diagnosis includes flu, COVID, RSV, strep, pharyngitis, bronchitis, pneumonia, infectious etiology.  This is not an exhaustive list.  Lab tests: I ordered and personally interpreted labs.  The pertinent results include: Viral panel positive for covid.  Imaging studies: Chest X-ray showed no acute cardiopulmonary disease.  Problem list/ ED course/ Critical interventions/ Medical management: HPI: See above Vital signs within normal range and stable throughout visit. Laboratory/imaging studies significant for: See above. On physical examination, patient is afebrile and appears in no acute distress. This patient presents with symptoms suspicious for flu. Based on history and physical doubt sinusitis. COVID test was positive. Do not suspect underlying cardiopulmonary process. I considered, but think unlikely, pneumonia. Patient is nontoxic appearing and not in need of emergent medical intervention. Patient told to self isolate at home until symptoms subside for 72 hours. Recommended patient to take Paxlovid for symptom relief.  Follow-up  with primary care physician for further evaluation and management.  Return to the ER if new or worsening symptoms. I have reviewed the patient home medicines and have made adjustments as needed.  Cardiac monitoring/EKG: The patient was maintained on a cardiac monitor.  I personally reviewed and interpreted the cardiac monitor which showed an underlying rhythm of: sinus rhythm.  Additional history obtained: External records from outside source obtained and reviewed including: Chart review including previous notes, labs, imaging.  Consultations obtained:  Disposition Continued outpatient therapy. Follow-up with PCP recommended for reevaluation of symptoms. Treatment plan discussed with patient.  Pt acknowledged understanding was agreeable to the plan. Worrisome signs and symptoms were discussed with patient, and patient acknowledged understanding to return to the ED if they noticed these signs and symptoms. Patient was stable upon discharge.   This chart was dictated using voice recognition software.  Despite best efforts to proofread,  errors can occur which can change the documentation meaning.          Final Clinical Impression(s) / ED Diagnoses Final diagnoses:  COVID    Rx / DC Orders ED Discharge Orders          Ordered    nirmatrelvir & ritonavir (PAXLOVID, 300/100,) 20 x 150 MG & 10 x 100MG  TBPK  2 times daily,   Status:  Discontinued        04/17/23 2129    benzonatate (TESSALON) 100 MG capsule  Every 8 hours,   Status:  Discontinued        04/17/23 2130    benzonatate (TESSALON) 100 MG capsule  Every 8 hours        04/17/23 2136    nirmatrelvir & ritonavir (PAXLOVID, 300/100,) 20 x 150 MG & 10 x 100MG  TBPK  2 times daily        04/17/23 2136              Jeanelle Malling, PA 04/17/23 2139    Sloan Leiter, DO 04/19/23 [phone]

## 2023-04-17 NOTE — Discharge Instructions (Addendum)
Please take your medications as prescribed. Take tylenol/ibuprofen for pain. Drink plenty of fluid. I recommend close follow-up with PCP for reevaluation.  Please do not hesitate to return to emergency department if worrisome signs symptoms we discussed become apparent.

## 2023-04-17 NOTE — ED Notes (Signed)
All appropriate discharge materials reviewed at length with patient. Time for questions provided. Pt has no other questions at this time and verbalizes understanding of all provided materials.  

## 2023-04-18 ENCOUNTER — Telehealth (HOSPITAL_BASED_OUTPATIENT_CLINIC_OR_DEPARTMENT_OTHER): Payer: Self-pay | Admitting: Emergency Medicine

## 2023-04-19 ENCOUNTER — Other Ambulatory Visit: Payer: Self-pay

## 2023-04-19 ENCOUNTER — Encounter (HOSPITAL_COMMUNITY): Payer: Self-pay

## 2023-04-19 ENCOUNTER — Emergency Department (HOSPITAL_COMMUNITY): Payer: Medicare Other

## 2023-04-19 ENCOUNTER — Inpatient Hospital Stay (HOSPITAL_COMMUNITY)
Admission: EM | Admit: 2023-04-19 | Discharge: 2023-04-25 | DRG: 177 | Disposition: A | Discharge status: Home Health | Payer: Medicare Other | Attending: Family Medicine | Admitting: Family Medicine

## 2023-04-19 DIAGNOSIS — E871 Hypo-osmolality and hyponatremia: Secondary | ICD-10-CM | POA: Diagnosis present

## 2023-04-19 DIAGNOSIS — Z8616 Personal history of COVID-19: Secondary | ICD-10-CM | POA: Diagnosis not present

## 2023-04-19 DIAGNOSIS — N179 Acute kidney failure, unspecified: Secondary | ICD-10-CM | POA: Diagnosis present

## 2023-04-19 DIAGNOSIS — R0602 Shortness of breath: Secondary | ICD-10-CM | POA: Diagnosis not present

## 2023-04-19 DIAGNOSIS — J9811 Atelectasis: Secondary | ICD-10-CM | POA: Diagnosis not present

## 2023-04-19 DIAGNOSIS — R059 Cough, unspecified: Secondary | ICD-10-CM | POA: Diagnosis present

## 2023-04-19 DIAGNOSIS — R531 Weakness: Secondary | ICD-10-CM | POA: Diagnosis not present

## 2023-04-19 DIAGNOSIS — I129 Hypertensive chronic kidney disease with stage 1 through stage 4 chronic kidney disease, or unspecified chronic kidney disease: Secondary | ICD-10-CM | POA: Diagnosis present

## 2023-04-19 DIAGNOSIS — Z823 Family history of stroke: Secondary | ICD-10-CM

## 2023-04-19 DIAGNOSIS — J9601 Acute respiratory failure with hypoxia: Secondary | ICD-10-CM | POA: Diagnosis present

## 2023-04-19 DIAGNOSIS — I959 Hypotension, unspecified: Secondary | ICD-10-CM | POA: Diagnosis not present

## 2023-04-19 DIAGNOSIS — U071 COVID-19: Principal | ICD-10-CM | POA: Diagnosis present

## 2023-04-19 DIAGNOSIS — I4891 Unspecified atrial fibrillation: Secondary | ICD-10-CM | POA: Diagnosis present

## 2023-04-19 DIAGNOSIS — Z882 Allergy status to sulfonamides status: Secondary | ICD-10-CM

## 2023-04-19 DIAGNOSIS — R918 Other nonspecific abnormal finding of lung field: Secondary | ICD-10-CM | POA: Diagnosis not present

## 2023-04-19 DIAGNOSIS — D631 Anemia in chronic kidney disease: Secondary | ICD-10-CM | POA: Diagnosis present

## 2023-04-19 DIAGNOSIS — K449 Diaphragmatic hernia without obstruction or gangrene: Secondary | ICD-10-CM | POA: Diagnosis present

## 2023-04-19 DIAGNOSIS — R0902 Hypoxemia: Secondary | ICD-10-CM | POA: Diagnosis not present

## 2023-04-19 DIAGNOSIS — Z79899 Other long term (current) drug therapy: Secondary | ICD-10-CM | POA: Diagnosis not present

## 2023-04-19 DIAGNOSIS — J1282 Pneumonia due to coronavirus disease 2019: Secondary | ICD-10-CM | POA: Diagnosis present

## 2023-04-19 DIAGNOSIS — Z801 Family history of malignant neoplasm of trachea, bronchus and lung: Secondary | ICD-10-CM | POA: Diagnosis not present

## 2023-04-19 DIAGNOSIS — M810 Age-related osteoporosis without current pathological fracture: Secondary | ICD-10-CM | POA: Diagnosis present

## 2023-04-19 DIAGNOSIS — Z91013 Allergy to seafood: Secondary | ICD-10-CM

## 2023-04-19 DIAGNOSIS — N1832 Chronic kidney disease, stage 3b: Secondary | ICD-10-CM | POA: Diagnosis present

## 2023-04-19 DIAGNOSIS — Z7901 Long term (current) use of anticoagulants: Secondary | ICD-10-CM | POA: Diagnosis not present

## 2023-04-19 DIAGNOSIS — Z853 Personal history of malignant neoplasm of breast: Secondary | ICD-10-CM

## 2023-04-19 DIAGNOSIS — E876 Hypokalemia: Secondary | ICD-10-CM | POA: Diagnosis present

## 2023-04-19 DIAGNOSIS — Z886 Allergy status to analgesic agent status: Secondary | ICD-10-CM

## 2023-04-19 DIAGNOSIS — Z881 Allergy status to other antibiotic agents status: Secondary | ICD-10-CM

## 2023-04-19 DIAGNOSIS — J90 Pleural effusion, not elsewhere classified: Secondary | ICD-10-CM | POA: Diagnosis not present

## 2023-04-19 DIAGNOSIS — R0989 Other specified symptoms and signs involving the circulatory and respiratory systems: Secondary | ICD-10-CM | POA: Diagnosis not present

## 2023-04-19 DIAGNOSIS — F0390 Unspecified dementia without behavioral disturbance: Secondary | ICD-10-CM | POA: Diagnosis present

## 2023-04-19 DIAGNOSIS — R0603 Acute respiratory distress: Secondary | ICD-10-CM | POA: Diagnosis not present

## 2023-04-19 DIAGNOSIS — Z66 Do not resuscitate: Secondary | ICD-10-CM | POA: Diagnosis not present

## 2023-04-19 DIAGNOSIS — Z8551 Personal history of malignant neoplasm of bladder: Secondary | ICD-10-CM

## 2023-04-19 DIAGNOSIS — J984 Other disorders of lung: Secondary | ICD-10-CM | POA: Diagnosis not present

## 2023-04-19 LAB — CBC WITH DIFFERENTIAL/PLATELET
Abs Immature Granulocytes: 0.06 10*3/uL (ref 0.00–0.07)
Basophils Absolute: 0 10*3/uL (ref 0.0–0.1)
Basophils Relative: 0 %
Eosinophils Absolute: 0 10*3/uL (ref 0.0–0.5)
Eosinophils Relative: 0 %
HCT: 34.5 % — ABNORMAL LOW (ref 36.0–46.0)
Hemoglobin: 11.1 g/dL — ABNORMAL LOW (ref 12.0–15.0)
Immature Granulocytes: 0 %
Lymphocytes Relative: 3 %
Lymphs Abs: 0.5 10*3/uL — ABNORMAL LOW (ref 0.7–4.0)
MCH: 28 pg (ref 26.0–34.0)
MCHC: 32.2 g/dL (ref 30.0–36.0)
MCV: 87.1 fL (ref 80.0–100.0)
Monocytes Absolute: 0.4 10*3/uL (ref 0.1–1.0)
Monocytes Relative: 3 %
Neutro Abs: 13.3 10*3/uL — ABNORMAL HIGH (ref 1.7–7.7)
Neutrophils Relative %: 94 %
Platelets: 190 10*3/uL (ref 150–400)
RBC: 3.96 MIL/uL (ref 3.87–5.11)
RDW: 16.1 % — ABNORMAL HIGH (ref 11.5–15.5)
WBC: 14.3 10*3/uL — ABNORMAL HIGH (ref 4.0–10.5)
nRBC: 0 % (ref 0.0–0.2)

## 2023-04-19 LAB — BASIC METABOLIC PANEL
Anion gap: 11 (ref 5–15)
BUN: 37 mg/dL — ABNORMAL HIGH (ref 8–23)
CO2: 24 mmol/L (ref 22–32)
Calcium: 8.8 mg/dL — ABNORMAL LOW (ref 8.9–10.3)
Chloride: 92 mmol/L — ABNORMAL LOW (ref 98–111)
Creatinine, Ser: 1.33 mg/dL — ABNORMAL HIGH (ref 0.44–1.00)
GFR, Estimated: 38 mL/min — ABNORMAL LOW (ref >60)
Glucose, Bld: 150 mg/dL — ABNORMAL HIGH (ref 70–99)
Potassium: 3.7 mmol/L (ref 3.5–5.1)
Sodium: 127 mmol/L — ABNORMAL LOW (ref 135–145)

## 2023-04-19 MED ORDER — ONDANSETRON HCL 4 MG/2ML IJ SOLN: 4.0000 mg | Freq: Four times a day (QID) | INTRAMUSCULAR | Status: DC | PRN

## 2023-04-19 MED ORDER — ACETAMINOPHEN 325 MG PO TABS
650.0000 mg | ORAL_TABLET | Freq: Four times a day (QID) | ORAL | Status: DC | PRN
  Administered 2023-04-23 – 2023-04-25 (×5): 650 mg via ORAL
  Filled 2023-04-19 (×5): qty 2

## 2023-04-19 MED ORDER — MEMANTINE HCL 10 MG PO TABS
10.0000 mg | ORAL_TABLET | Freq: Two times a day (BID) | ORAL | Status: DC
  Administered 2023-04-20 – 2023-04-25 (×12): 10 mg via ORAL
  Filled 2023-04-19 (×12): qty 1

## 2023-04-19 MED ORDER — SODIUM CHLORIDE 0.9 % IV BOLUS
500.0000 mL | Freq: Once | INTRAVENOUS | Status: AC
  Administered 2023-04-19: 500 mL via INTRAVENOUS

## 2023-04-19 MED ORDER — METOPROLOL SUCCINATE ER 25 MG PO TB24
25.0000 mg | ORAL_TABLET | Freq: Every day | ORAL | Status: DC
  Administered 2023-04-20 – 2023-04-25 (×6): 25 mg via ORAL
  Filled 2023-04-19 (×6): qty 1

## 2023-04-19 MED ORDER — DILTIAZEM HCL ER COATED BEADS 120 MG PO CP24
120.0000 mg | ORAL_CAPSULE | Freq: Every day | ORAL | Status: DC
  Administered 2023-04-20 – 2023-04-24 (×6): 120 mg via ORAL
  Filled 2023-04-19 (×6): qty 1

## 2023-04-19 MED ORDER — FUROSEMIDE 10 MG/ML IJ SOLN
20.0000 mg | Freq: Once | INTRAMUSCULAR | Status: AC
  Administered 2023-04-20: 20 mg via INTRAVENOUS
  Filled 2023-04-19: qty 2

## 2023-04-19 MED ORDER — ONDANSETRON HCL 4 MG PO TABS: 4.0000 mg | ORAL_TABLET | Freq: Four times a day (QID) | ORAL | Status: DC | PRN

## 2023-04-19 MED ORDER — ENOXAPARIN SODIUM 40 MG/0.4ML IJ SOSY
40.0000 mg | PREFILLED_SYRINGE | INTRAMUSCULAR | Status: DC
  Administered 2023-04-20: 40 mg via SUBCUTANEOUS
  Filled 2023-04-19: qty 0.4

## 2023-04-19 MED ORDER — OXYCODONE HCL 5 MG PO TABS: 5.0000 mg | ORAL_TABLET | ORAL | Status: DC | PRN

## 2023-04-19 MED ORDER — ACETAMINOPHEN 500 MG PO TABS: 500.0000 mg | ORAL_TABLET | Freq: Four times a day (QID) | ORAL | Status: DC | PRN

## 2023-04-19 MED ORDER — IOHEXOL 350 MG/ML SOLN
65.0000 mL | Freq: Once | INTRAVENOUS | Status: AC | PRN
  Administered 2023-04-19: 65 mL via INTRAVENOUS

## 2023-04-19 MED ORDER — HYDROCHLOROTHIAZIDE 12.5 MG PO TABS
12.5000 mg | ORAL_TABLET | Freq: Every day | ORAL | Status: DC
  Administered 2023-04-20 (×2): 12.5 mg via ORAL
  Filled 2023-04-19 (×2): qty 1

## 2023-04-19 MED ORDER — ACETAMINOPHEN 650 MG RE SUPP: 650.0000 mg | Freq: Four times a day (QID) | RECTAL | Status: DC | PRN

## 2023-04-19 NOTE — ED Provider Notes (Addendum)
[place] EMERGENCY DEPARTMENT AT [facility] Provider Note   CSN: [id number] Arrival date & time: 04/19/23  1620     History  Chief Complaint  Patient presents with   Cough    Gina Mcgee is a 89 y.o. female.  Pt is a 89 yo female with pmhx significant for htn, breast cancer, afib on Eliquis, breast cancer, and bladder cancer.  Pt has had a cough for about a week.  She did come here on 7/26 and was diagnosed with covid.  Pt was d/c with Paxlovid and although she is on Eliquis, it was filled.  Pt given 1 dose and then pt was called and told not to take.  Pt has not been eating/drinking much.  She has continued to have a fever intermittently.  Daughter said she normally can walk with a walker, but has not been able to walk.  O2 sats were staying in the upper 80s.       Home Medications Prior to Admission medications   Medication Sig Start Date End Date Taking? Authorizing Provider  apixaban (ELIQUIS) 2.5 MG TABS tablet Take 1 tablet (2.5 mg total) by mouth 2 (two) times daily. 12/10/22  Yes [name], Manish J, MD  Cholecalciferol (VITAMIN D3) 10 MCG (400 UNIT) CAPS Take 400 Units by mouth 2 (two) times daily.   Yes [provider]  cyanocobalamin (VITAMIN B12) 1000 MCG/ML injection Inject 1,000 mcg into the muscle every 30 (thirty) days.   Yes [provider]  benzonatate (TESSALON) 100 MG capsule Take 1 capsule (100 mg total) by mouth every 8 (eight) hours. Patient not taking: Reported on 04/19/2023 04/17/23   Jeanelle Malling, PA  diltiazem (CARDIZEM CD) 120 MG 24 hr capsule Take 1 capsule (120 mg total) by mouth daily. 01/30/23   [name], [name], MD  ferrous sulfate 325 (65 FE) MG tablet Take 1 tablet (325 mg total) by mouth every 3 (three) days. 01/02/23   Rodolph Bong, MD  hydrochlorothiazide (HYDRODIURIL) 12.5 MG tablet Take 1 tablet (12.5 mg total) by mouth daily. 01/30/23   [name], [name], MD  ketoconazole (NIZORAL) 2 % cream Apply 1  Application topically at bedtime. Apply to toes    [provider]  memantine (NAMENDA) 10 MG tablet Take 10 mg by mouth 2 (two) times daily.    [provider]  metoprolol succinate (TOPROL-XL) 25 MG 24 hr tablet Take 1 tablet (25 mg total) by mouth daily. 01/30/23   [name], [name], MD  Multiple Vitamins-Minerals (MULTIVITAMIN WITH MINERALS) tablet Take 1 tablet by mouth daily.    [provider]  nirmatrelvir & ritonavir (PAXLOVID, 300/100,) 20 x 150 MG & 10 x 100MG  TBPK Take 3 tablets by mouth 2 (two) times daily for 5 days. 04/17/23 04/22/23  Jeanelle Malling, PA  senna-docusate (SENOKOT-S) 8.6-50 MG tablet Take 1 tablet by mouth daily.    [provider]      Allergies    Aspirin, Cipro [ciprofloxacin hcl], Doxycycline, Fish allergy, Nsaids, Shellfish-derived products, Aloe, Latex, and Sulfa antibiotics    Review of Systems   Review of Systems  Respiratory:  Positive for cough and shortness of breath.   All other systems reviewed and are negative.   Physical Exam Updated Vital Signs BP (!) 142/96   Pulse 81   Temp 98.6 F (37 C) (Oral)   Resp 18   SpO2 91%  Physical Exam Vitals and nursing note reviewed.  Constitutional:  Appearance: Normal appearance.  HENT:     Head: Normocephalic and atraumatic.     Right Ear: External ear normal.     Left Ear: External ear normal.     Nose: Nose normal.     Mouth/Throat:     Mouth: Mucous membranes are moist.     Pharynx: Oropharynx is clear.  Eyes:     Extraocular Movements: Extraocular movements intact.     Conjunctiva/sclera: Conjunctivae normal.     Pupils: Pupils are equal, round, and reactive to light.  Cardiovascular:     Rate and Rhythm: Normal rate and regular rhythm.     Pulses: Normal pulses.     Heart sounds: Normal heart sounds.  Pulmonary:     Effort: Pulmonary effort is normal.     Breath sounds: Rhonchi present.  Abdominal:     General: Abdomen is flat. Bowel sounds are  normal.     Palpations: Abdomen is soft.  Musculoskeletal:        General: Normal range of motion.     Cervical back: Normal range of motion and neck supple.  Skin:    General: Skin is warm.     Capillary Refill: Capillary refill takes less than 2 seconds.  Neurological:     General: No focal deficit present.     Mental Status: She is alert and oriented to person, place, and time.  Psychiatric:        Mood and Affect: Mood normal.        Behavior: Behavior normal.     ED Results / Procedures / Treatments   Labs (all labs ordered are listed, but only abnormal results are displayed) Labs Reviewed  BASIC METABOLIC PANEL - Abnormal; Notable for the following components:      Result Value   Sodium 127 (*)    Chloride 92 (*)    Glucose, Bld 150 (*)    BUN 37 (*)    Creatinine, Ser 1.33 (*)    Calcium 8.8 (*)    GFR, Estimated 38 (*)    All other components within normal limits  CBC WITH DIFFERENTIAL/PLATELET - Abnormal; Notable for the following components:   WBC 14.3 (*)    Hemoglobin 11.1 (*)    HCT 34.5 (*)    RDW 16.1 (*)    Neutro Abs 13.3 (*)    Lymphs Abs 0.5 (*)    All other components within normal limits    EKG EKG Interpretation Date/Time:  Sunday April 19 2023 19:02:15 EDT Ventricular Rate:  80 PR Interval:  189 QRS Duration:  86 QT Interval:  380 QTC Calculation: 439 R Axis:   -25  Text Interpretation: Sinus rhythm Atrial premature complex Borderline left axis deviation No significant change since last tracing Confirmed by Jacalyn Lefevre [phone]) on 04/19/2023 7:04:36 PM  Radiology CT Angio Chest PE W and/or Wo Contrast  Result Date: 04/19/2023 CLINICAL DATA:  Cough. EXAM: CT ANGIOGRAPHY CHEST WITH CONTRAST TECHNIQUE: Multidetector CT imaging of the chest was performed using the standard protocol during bolus administration of intravenous contrast. Multiplanar CT image reconstructions and MIPs were obtained to evaluate the vascular anatomy. RADIATION DOSE  REDUCTION: This exam was performed according to the departmental dose-optimization program which includes automated exposure control, adjustment of the mA and/or kV according to patient size and/or use of iterative reconstruction technique. CONTRAST:  65mL OMNIPAQUE IOHEXOL 350 MG/ML SOLN COMPARISON:  January 05, 2023. FINDINGS: Cardiovascular: Satisfactory opacification of the pulmonary arteries to the segmental level. No evidence  of pulmonary embolism. Normal heart size. No pericardial effusion. Mediastinum/Nodes: Large hiatal hernia. No adenopathy. Thyroid gland is unremarkable. Lungs/Pleura: Small left pleural effusion is noted with adjacent subsegmental atelectasis. Stable left upper lobe scarring is noted. Minimal right pleural effusion is noted with adjacent subsegmental atelectasis. Upper Abdomen: No acute abnormality. Musculoskeletal: Multiple unchanged old lower thoracic compression fractures are noted. Review of the MIP images confirms the above findings. IMPRESSION: No definite evidence of pulmonary embolus. Large hiatal hernia. Small left pleural effusion with adjacent subsegmental atelectasis of left lower lobe. Minimal right pleural effusion is noted with adjacent subsegmental atelectasis. Aortic Atherosclerosis (ICD10-I70.0). Electronically Signed   By: Lupita Raider M.D.   On: 04/19/2023 18:36   DG Chest Port 1 View  Result Date: 04/19/2023 CLINICAL DATA:  Shortness of breath EXAM: PORTABLE CHEST 1 VIEW COMPARISON:  April 17, 2023, January 19, 2023 FINDINGS: Evaluation is limited by rotation. The cardiomediastinal silhouette is unchanged in contour.Similar LEFT retrocardiac opacity in comparison to priors. Similar small LEFT pleural effusion. No pneumothorax. No acute pleuroparenchymal abnormality. IMPRESSION: 1. Similar LEFT retrocardiac opacity in comparison to priors. This is favored to reflect atelectasis/scar. 2. Similar small LEFT pleural effusion. Electronically Signed   By: Meda Klinefelter M.D.   On: 04/19/2023 17:32    Procedures Procedures    Medications Ordered in ED Medications  sodium chloride 0.9 % bolus 500 mL (0 mLs Intravenous Stopped 04/19/23 1904)  iohexol (OMNIPAQUE) 350 MG/ML injection 65 mL (65 mLs Intravenous Contrast Given 04/19/23 1806)    ED Course/ Medical Decision Making/ A&P                             Medical Decision Making Amount and/or Complexity of Data Reviewed Labs: ordered. Radiology: ordered.  Risk Prescription drug management. Decision regarding hospitalization.   This patient presents to the ED for concern of sob, this involves an extensive number of treatment options, and is a complaint that carries with it a high risk of complications and morbidity.  The differential diagnosis includes covid, pna, dehydration   Co morbidities that complicate the patient evaluation  htn, breast cancer, afib on Eliquis, breast cancer, and bladder cancer   Additional history obtained:  Additional history obtained from epic chart review External records from outside source obtained and reviewed including EMS report   Lab Tests:  I Ordered, and personally interpreted labs.  The pertinent results include:  cbc with wbc elevated at 14.3, bmp with na low at 127 (na 135 on 7/26) bun 37 and cr 1.33 (bun 27 and cr 1.01 on 7/26)   Imaging Studies ordered:  I ordered imaging studies including cxr  I independently visualized and interpreted imaging which showed  CXR: Similar LEFT retrocardiac opacity in comparison to priors. This  is favored to reflect atelectasis/scar.  2. Similar small LEFT pleural effusion.  CT chest: No definite evidence of pulmonary embolus.    Large hiatal hernia.    Small left pleural effusion with adjacent subsegmental atelectasis  of left lower lobe. Minimal right pleural effusion is noted with  adjacent subsegmental atelectasis.    Aortic Atherosclerosis (ICD10-I70.0).   I agree with the radiologist  interpretation   Cardiac Monitoring:  The patient was maintained on a cardiac monitor.  I personally viewed and interpreted the cardiac monitored which showed an underlying rhythm of: nsr   Medicines ordered and prescription drug management:  I ordered medication including ivfs  for dehydration  Reevaluation of the patient after these medicines showed that the patient improved I have reviewed the patients home medicines and have made adjustments as needed   Test Considered:  ct   Critical Interventions:  oxygen   Consultations Obtained:  I requested consultation with the hospitalist (Dr. Avie Arenas),  and discussed lab and imaging findings as well as pertinent plan -he will admit  Problem List / ED Course:  Covid-19:  pt's O2 sats dropped to the upper 80s on RA, so she is put on 2L oxygen via [place].  02 sat now in the mid-90s.  AKI:  pt given ivfs Generalized weakness due to Covid.   Reevaluation:  After the interventions noted above, I reevaluated the patient and found that they have :improved   Social Determinants of Health:  Lives at home with daughter   Dispostion:  After consideration of the diagnostic results and the patients response to treatment, I feel that the patent would benefit from admission.    Gina Mcgee was evaluated in Emergency Department on 04/19/2023 for the symptoms described in the history of present illness. She was evaluated in the context of the global COVID-19 pandemic, which necessitated consideration that the patient might be at risk for infection with the SARS-CoV-2 virus that causes COVID-19. Institutional protocols and algorithms that pertain to the evaluation of patients at risk for COVID-19 are in a state of rapid change based on information released by regulatory bodies including the CDC and federal and state organizations. These policies and algorithms were followed during the patient's care in the ED.         Final Clinical  Impression(s) / ED Diagnoses Final diagnoses:  COVID-19  AKI (acute kidney injury) (HCC)  Acute respiratory failure with hypoxia [facility])    Rx / DC Orders ED Discharge Orders     None         Jacalyn Lefevre, MD 04/19/23 1911    Jacalyn Lefevre, MD 04/19/23 2023

## 2023-04-19 NOTE — ED Triage Notes (Signed)
[facility] reports pt coming from home. Pt tested positive for Covid on Friday. Having a cough and weakness, not eating well. Daughter states pt has had a low grade fever even with Tylenol.

## 2023-04-19 NOTE — H&P (Signed)
History and Physical    Gina Mcgee WJX:[id number] DOB: 07-Aug-1930 DOA: 04/19/2023  PCP: [name], MD    Chief Complaint: cough  HPI: Gina Mcgee is a 89 y.o. female with medical history significant of A-fib, hypertension breast cancer who presents emergency department due to cough.  Patient was diagnosed with COVID 2 days ago and was seen in the emergency department and eventually discharged.  She has been feeling poorly and short of breath at home.  O2 sats were found to be in the 80s so she was transported to the ER for further assessment.  On arrival she was satting in the low 90s and hemodynamically stable.  Labs were in which revealed WBC 14.3, hemoglobin 11.1, platelets 190, sodium 127, creatinine 1.3.  Patient had CT pulmonary embolism study which demonstrated large hiatal hernia and pleural effusion.  Patient was admitted for further workup.  On evaluation she was confused did not know where she was.  Family was at bedside comforting her.   Review of Systems: Cough, altered mental status   As per HPI otherwise 10 point review of systems negative.   Allergies  Allergen Reactions   Aspirin Other (See Comments)    Gets jitter with large doses   Cipro [Ciprofloxacin Hcl] Diarrhea   Doxycycline Diarrhea   Fish Allergy Nausea Only and Other (See Comments)    "makes me jumpy and twitchy, but grand children are allergic"    Nsaids     Can take for short periods of time. Naprosyn -feels dazed   Shellfish-Derived Products Other (See Comments)    "makes me jumpy and twitchy, but grand children are allergic"   Aloe Rash   Latex Rash   Sulfa Antibiotics Rash    Past Medical History:  Diagnosis Date   A-fib (HCC)    Brain concussion 09/22/1958   Cancer [facility])    left-sided breast cancer   Concussion 09/22/1958   with left-sided neuro defecits   HTN (hypertension)    Osteoporosis     Past Surgical History:  Procedure Laterality Date   BOWEL RESECTION  approx. in  2007   polyp and formed stricture   BREAST LUMPECTOMY  1981   left lumpectomy w/ radiation   COLONOSCOPY W/ POLYPECTOMY     FEMUR SURGERY  2011   left femur pinning   JOINT REPLACEMENT     ORIF HUMERUS FRACTURE Left 01/25/2013   Procedure: OPEN REDUCTION INTERNAL FIXATION (ORIF) DISTAL HUMERUS FRACTURE REPAIR RECONSTRUCTION AND ULNA NERVE DECOMPRESSION AND ANTERIOR TRANSPOSITION AS NECESSARY;  Surgeon: Dominica Severin, MD;  Location: [facility] OR;  Service: Orthopedics;  Laterality: Left;     reports that she has never smoked. She has never used smokeless tobacco. She reports that she does not currently use alcohol. She reports that she does not use drugs.  Family History  Problem Relation Age of Onset   Stroke Mother    Lung cancer Father    Stroke Father     Prior to Admission medications   Medication Sig Start Date End Date Taking? Authorizing Provider  acetaminophen (TYLENOL) 500 MG tablet Take 500 mg by mouth every 6 (six) hours as needed for moderate pain.   Yes [provider]  apixaban (ELIQUIS) 2.5 MG TABS tablet Take 1 tablet (2.5 mg total) by mouth 2 (two) times daily. 12/10/22  Yes [name], Manish J, MD  Cholecalciferol (VITAMIN D3) 10 MCG (400 UNIT) CAPS Take 400 Units by mouth 2 (two) times daily.   Yes [provider]  cyanocobalamin (VITAMIN B12) 1000 MCG/ML injection Inject 1,000 mcg into the muscle every 30 (thirty) days.   Yes [provider]  diltiazem (CARDIZEM CD) 120 MG 24 hr capsule Take 1 capsule (120 mg total) by mouth daily. Patient taking differently: Take 120 mg by mouth every evening. 01/30/23  Yes [name], Manish J, MD  ferrous sulfate 325 (65 FE) MG tablet Take 1 tablet (325 mg total) by mouth every 3 (three) days. 01/02/23  Yes Rodolph Bong, MD  hydrochlorothiazide (HYDRODIURIL) 12.5 MG tablet Take 1 tablet (12.5 mg total) by mouth daily. 01/30/23  Yes [name], Manish J, MD  ketoconazole (NIZORAL) 2 % cream Apply 1  Application topically daily as needed for irritation. Apply to toes   Yes [provider]  memantine (NAMENDA) 10 MG tablet Take 10 mg by mouth 2 (two) times daily.   Yes [provider]  metoprolol succinate (TOPROL-XL) 25 MG 24 hr tablet Take 1 tablet (25 mg total) by mouth daily. 01/30/23  Yes [name], [name], MD  Multiple Vitamins-Minerals (MULTIVITAMIN WITH MINERALS) tablet Take 1 tablet by mouth every evening.   Yes [provider]  Saccharomyces boulardii (PROBIOTIC) 250 MG CAPS Take 250 mg by mouth daily.   Yes [provider]  senna-docusate (SENOKOT-S) 8.6-50 MG tablet Take 1 tablet by mouth daily.   Yes [provider]  benzonatate (TESSALON) 100 MG capsule Take 1 capsule (100 mg total) by mouth every 8 (eight) hours. Patient not taking: Reported on 04/19/2023 04/17/23   Jeanelle Malling, PA  nirmatrelvir & ritonavir (PAXLOVID, 300/100,) 20 x 150 MG & 10 x 100MG  TBPK Take 3 tablets by mouth 2 (two) times daily for 5 days. Patient not taking: Reported on 04/19/2023 04/17/23 04/22/23  Jeanelle Malling, PA    Physical Exam: Vitals:   04/19/23 1628 04/19/23 1629 04/19/23 1900 04/19/23 1936  BP:  (!) 142/96 (!) 138/53 (!) 156/96  Pulse:  81 (!) 51 86  Resp:  18 (!) 26 (!) 28  Temp: 98.6 F (37 C) 98.6 F (37 C)  98.2 F (36.8 C)  TempSrc: Oral Oral  Oral  SpO2:  91% (!) 80% 96%   Physical Exam Constitutional:      Appearance: She is normal weight.  HENT:     Head: Normocephalic.     Nose: Nose normal.     Mouth/Throat:     Mouth: Mucous membranes are moist.     Pharynx: Oropharynx is clear.  Eyes:     Pupils: Pupils are equal, round, and reactive to light.  Cardiovascular:     Rate and Rhythm: Normal rate and regular rhythm.     Pulses: Normal pulses.  Pulmonary:     Effort: Pulmonary effort is normal.  Abdominal:     General: Abdomen is flat. Bowel sounds are normal.  Musculoskeletal:        General: Normal range of motion.     Cervical  back: Normal range of motion.  Skin:    General: Skin is warm.     Capillary Refill: Capillary refill takes less than 2 seconds.  Neurological:     General: No focal deficit present.     Mental Status: She is alert.        Labs on Admission: I have personally reviewed the patients's labs and imaging studies.  Assessment/Plan Principal Problem:   Acute hypoxemic respiratory failure (HCC)   # Acute hypoxic respiratory failure secondary COVID-19 #Acute infectious encephalopathy secondary to COVID-19  Patient was diagnosed COVID 2 days ago and has had subjective shortness of breath Found to be hypoxic in the 80s at home Workup in the emergency department largely unrevealing  Altered and only oriented to self Leukocytosis PLAN: Continue supplemental oxygen  # Hyponatremia-continue to monitor  # Anemia-trend CBC  #CKD stage IIIb-trend creatinine  # Hypertension-continue hydrochlorothiazide, diltiazem, metoprolol  # Dementia-continue Namenda    Admission status: Inpatient Telemetry  Certification: The appropriate patient status for this patient is INPATIENT. Inpatient status is judged to be reasonable and necessary in order to provide the required intensity of service to ensure the patient's safety. The patient's presenting symptoms, physical exam findings, and initial radiographic and laboratory data in the context of their chronic comorbidities is felt to place them at high risk for further clinical deterioration. Furthermore, it is not anticipated that the patient will be medically stable for discharge from the hospital within 2 midnights of admission.   * I certify that at the point of admission it is my clinical judgment that the patient will require inpatient hospital care spanning beyond 2 midnights from the point of admission due to high intensity of service, high risk for further deterioration and high frequency of surveillance required.Alan Mulder MD [facility] If 7PM-7AM, please contact night-coverage [email or web address]  04/19/2023, 9:37 PM

## 2023-04-20 DIAGNOSIS — J9601 Acute respiratory failure with hypoxia: Secondary | ICD-10-CM | POA: Diagnosis not present

## 2023-04-20 MED ORDER — APIXABAN 2.5 MG PO TABS
2.5000 mg | ORAL_TABLET | Freq: Two times a day (BID) | ORAL | Status: DC
  Administered 2023-04-20 – 2023-04-22 (×4): 2.5 mg via ORAL
  Filled 2023-04-20 (×4): qty 1

## 2023-04-20 MED ORDER — POTASSIUM CHLORIDE 20 MEQ PO PACK
40.0000 meq | PACK | Freq: Once | ORAL | Status: AC
  Administered 2023-04-20: 40 meq via ORAL
  Filled 2023-04-20: qty 2

## 2023-04-20 NOTE — Progress Notes (Addendum)
PROGRESS NOTE    Gina Mcgee  JYN:[id number] DOB: 11/18/1929 DOA: 04/19/2023  PCP: [name], MD    Brief Narrative:  This 89 years old female with PMH significant for A-fib, hypertension, breast cancer presented the ED with complaints of cough and shortness of breath.  She was diagnosed 2 days ago with COVID and was seen in the ED and was eventually discharged.  Patient reports feeling very weak and having worsening shortness of breath at home.  She was found to be hypoxic with SpO2 of 80% on room air when she was transported to the ED for further assessment.  Pertinent labs include WBC 14.3 sodium 127 creatinine 1.3.  Patient had CTA chest which ruled out pulmonary embolism but showed large hiatal hernia and pleural effusion.  Patient was admitted for further evaluation.  Assessment & Plan:   Principal Problem:   Acute hypoxemic respiratory failure (HCC)  Acute hypoxic respiratory failure secondary to COVID-19: Acute infectious encephalopathy secondary to COVID-19: Patient was diagnosed with COVID-19  2 days ago and had subjective shortness of breath. She was found to be hypoxic with SpO2 of 80% on room air. Workup in the ED largely unrevealing.  Patient is alert and oriented to self. Continue supplemental oxygen.  Wean as tolerated  Hyponatremia: Likely in the setting of COVID 19. Continue Iv hydration. Sodium improving 127 >132  CKD stage IIIb: Creatinine at baseline.  Avoid nephrotoxic medications.  Essential hypertension: Continue  HCTZ, diltiazem and metoprolol.  Dementia:  Continue Namenda  Hypokalemia: Replaced. Continue to monitor.  Leucocytosis: Could be due to Covid. Monitor    DVT prophylaxis: Lovenox Code Status: Full code Family Communication: No family at bed side. Disposition Plan:    Status is: Inpatient Remains inpatient appropriate because: Admitted for acute hypoxic respiratory failure secondary to COVID-19 pneumonia.    Consultants:   None  Procedures: CTA chest  Antimicrobials:  Anti-infectives (From admission, onward)    None       Subjective: Patient was seen and examined at bedside,  overnight events noted.   Patient reports doing much better,  She remains on 2 L of supplemental oxygen. She does not use oxygen at baseline.  Objective: Vitals:   04/20/23 0459 04/20/23 0753 04/20/23 1200 04/20/23 1258  BP: 102/70 119/67  117/64  Pulse: 88 84  87  Resp: 20     Temp: 97.6 F (36.4 C) 98.3 F (36.8 C)  98.5 F (36.9 C)  TempSrc: Oral Oral  Oral  SpO2: 97% 100% 97% 98%  Weight:      Height:        Intake/Output Summary (Last 24 hours) at 04/20/2023 1412 Last data filed at 04/20/2023 1310 Gross per 24 hour  Intake 1100 ml  Output 1350 ml  Net -250 ml   Filed Weights   04/19/23 2337  Weight: 50.8 kg    Examination:  General exam: Appears calm and comfortable, deconditioned, not in any acute distress. Respiratory system: Clear to auscultation. Respiratory effort normal.  RR 15 Cardiovascular system: S1 & S2 heard, regular rate and rhythm, no murmur no pedal edema. Gastrointestinal system: Abdomen is nondistended, soft and nontender. Normal bowel sounds heard. Central nervous system: Alert and oriented x 3. No focal neurological deficits. Extremities: No edema, No cyanosis, No clubbing  Skin: No rashes, lesions or ulcers Psychiatry: Judgement and insight appear normal. Mood & affect appropriate.     Data Reviewed: I have personally reviewed following labs and imaging studies  CBC: Recent  Labs  Lab 04/17/23 1940 04/19/23 1650 04/20/23 0355  WBC 12.6* 14.3* 15.8*  NEUTROABS 11.3* 13.3*  --   HGB 12.5 11.1* 11.5*  HCT 38.0 34.5* 35.9*  MCV 86.8 87.1 88.9  PLT 236 190 195   Basic Metabolic Panel: Recent Labs  Lab 04/17/23 1940 04/19/23 1650 04/20/23 0355  NA 135 127* 132*  K 3.3* 3.7 3.2*  CL 96* 92* 94*  CO2 29 24 28   GLUCOSE 147* 150* 130*  BUN 27* 37* 26*  CREATININE  1.01* 1.33* 1.24*  CALCIUM 9.6 8.8* 8.8*   GFR: Estimated Creatinine Clearance: 21.7 mL/min (A) (by C-G formula based on SCr of 1.24 mg/dL (H)). Liver Function Tests: No results for input(s): "AST", "ALT", "ALKPHOS", "BILITOT", "PROT", "ALBUMIN" in the last 168 hours. No results for input(s): "LIPASE", "AMYLASE" in the last 168 hours. No results for input(s): "AMMONIA" in the last 168 hours. Coagulation Profile: No results for input(s): "INR", "PROTIME" in the last 168 hours. Cardiac Enzymes: No results for input(s): "CKTOTAL", "CKMB", "CKMBINDEX", "TROPONINI" in the last 168 hours. BNP (last 3 results) No results for input(s): "PROBNP" in the last 8760 hours. HbA1C: No results for input(s): "HGBA1C" in the last 72 hours. CBG: No results for input(s): "GLUCAP" in the last 168 hours. Lipid Profile: No results for input(s): "CHOL", "HDL", "LDLCALC", "TRIG", "CHOLHDL", "LDLDIRECT" in the last 72 hours. Thyroid Function Tests: No results for input(s): "TSH", "T4TOTAL", "FREET4", "T3FREE", "THYROIDAB" in the last 72 hours. Anemia Panel: No results for input(s): "VITAMINB12", "FOLATE", "FERRITIN", "TIBC", "IRON", "RETICCTPCT" in the last 72 hours. Sepsis Labs: No results for input(s): "PROCALCITON", "LATICACIDVEN" in the last 168 hours.  Recent Results (from the past 240 hour(s))  SARS Coronavirus 2 by RT PCR (hospital order, performed in [facility] hospital lab) *cepheid single result test* Anterior Nasal Swab     Status: Abnormal   Collection Time: 04/17/23  6:14 PM   Specimen: Anterior Nasal Swab  Result Value Ref Range Status   SARS Coronavirus 2 by RT PCR POSITIVE (A) NEGATIVE Final    Comment: (NOTE) SARS-CoV-2 target nucleic acids are DETECTED  SARS-CoV-2 RNA is generally detectable in upper respiratory specimens  during the acute phase of infection.  Positive results are indicative  of the presence of the identified virus, but do not rule out bacterial infection or  co-infection with other pathogens not detected by the test.  Clinical correlation with patient history and  other diagnostic information is necessary to determine patient infection status.  The expected result is negative.  Fact Sheet for Patients:   RoadLapTop.co.za   Fact Sheet for Healthcare Providers:   http://kim-miller.com/    This test is not yet approved or cleared by the [place] FDA and  has been authorized for detection and/or diagnosis of SARS-CoV-2 by FDA under an Emergency Use Authorization (EUA).  This EUA will remain in effect (meaning this test can be used) for the duration of  the COVID-19 declaration under Section 564(b)(1)  of the Act, 21 U.S.C. section 360-bbb-3(b)(1), unless the authorization is terminated or revoked sooner.   Performed at [facility], [address] [place], [place] [address]          Radiology Studies: CT Angio Chest PE W and/or Wo Contrast  Result Date: 04/19/2023 CLINICAL DATA:  Cough. EXAM: CT ANGIOGRAPHY CHEST WITH CONTRAST TECHNIQUE: Multidetector CT imaging of the chest was performed using the standard protocol during bolus administration of intravenous contrast. Multiplanar CT image reconstructions and MIPs were  obtained to evaluate the vascular anatomy. RADIATION DOSE REDUCTION: This exam was performed according to the departmental dose-optimization program which includes automated exposure control, adjustment of the mA and/or kV according to patient size and/or use of iterative reconstruction technique. CONTRAST:  65mL OMNIPAQUE IOHEXOL 350 MG/ML SOLN COMPARISON:  January 05, 2023. FINDINGS: Cardiovascular: Satisfactory opacification of the pulmonary arteries to the segmental level. No evidence of pulmonary embolism. Normal heart size. No pericardial effusion. Mediastinum/Nodes: Large hiatal hernia. No adenopathy. Thyroid gland is unremarkable. Lungs/Pleura: Small  left pleural effusion is noted with adjacent subsegmental atelectasis. Stable left upper lobe scarring is noted. Minimal right pleural effusion is noted with adjacent subsegmental atelectasis. Upper Abdomen: No acute abnormality. Musculoskeletal: Multiple unchanged old lower thoracic compression fractures are noted. Review of the MIP images confirms the above findings. IMPRESSION: No definite evidence of pulmonary embolus. Large hiatal hernia. Small left pleural effusion with adjacent subsegmental atelectasis of left lower lobe. Minimal right pleural effusion is noted with adjacent subsegmental atelectasis. Aortic Atherosclerosis (ICD10-I70.0). Electronically Signed   By: Lupita Raider M.D.   On: 04/19/2023 18:36   DG Chest Port 1 View  Result Date: 04/19/2023 CLINICAL DATA:  Shortness of breath EXAM: PORTABLE CHEST 1 VIEW COMPARISON:  April 17, 2023, January 19, 2023 FINDINGS: Evaluation is limited by rotation. The cardiomediastinal silhouette is unchanged in contour.Similar LEFT retrocardiac opacity in comparison to priors. Similar small LEFT pleural effusion. No pneumothorax. No acute pleuroparenchymal abnormality. IMPRESSION: 1. Similar LEFT retrocardiac opacity in comparison to priors. This is favored to reflect atelectasis/scar. 2. Similar small LEFT pleural effusion. Electronically Signed   By: Meda Klinefelter M.D.   On: 04/19/2023 17:32    Scheduled Meds:  diltiazem  120 mg Oral QHS   enoxaparin (LOVENOX) injection  40 mg Subcutaneous Q24H   hydrochlorothiazide  12.5 mg Oral Daily   memantine  10 mg Oral BID   metoprolol succinate  25 mg Oral Daily   Continuous Infusions:   LOS: 1 day    Time spent: 50 mins    Willeen Niece, MD [facility]   If 7PM-7AM, please contact night-coverage

## 2023-04-20 NOTE — TOC Initial Note (Signed)
Transition of Care [facility]) - Initial/Assessment Note    Patient Details  Name: Gina Mcgee MRN: [id number] Date of Birth: 03/11/1930  [facility] CM/SW Contact:    Larrie Kass, LCSW Phone Number: 04/20/2023, 11:43 AM  Clinical Narrative:                 Received consult for [facility], CSW confirmed pt is active with [facility]. TOC will follow for d/c needs.   Expected Discharge Plan:  (TBD) Barriers to Discharge: Continued Medical Work up   Patient Goals and CMS Choice            Expected Discharge Plan and Services                                              Prior Living Arrangements/Services                       Activities of Daily Living Home Assistive Devices/Equipment: Eyeglasses, Environmental consultant (specify type), Wheelchair, Medical laboratory scientific officer (specify quad or straight), Nebulizer ADL Screening (condition at time of admission) Patient's cognitive ability adequate to safely complete daily activities?: No Is the patient deaf or have difficulty hearing?: Yes Does the patient have difficulty seeing, even when wearing glasses/contacts?: No Does the patient have difficulty concentrating, remembering, or making decisions?: Yes Patient able to express need for assistance with ADLs?: Yes Does the patient have difficulty dressing or bathing?: Yes Independently performs ADLs?: No Communication: Needs assistance Is this a change from baseline?: Pre-admission baseline Dressing (OT): Needs assistance Is this a change from baseline?: Pre-admission baseline Grooming: Needs assistance Is this a change from baseline?: Pre-admission baseline Feeding: Independent with device (comment) (independent after set-up) Bathing: Needs assistance Is this a change from baseline?: Pre-admission baseline Toileting: Needs assistance Is this a change from baseline?: Pre-admission baseline In/Out Bed: Needs assistance Is this a change from baseline?: Pre-admission  baseline Walks in Home: Needs assistance Is this a change from baseline?: Pre-admission baseline Does the patient have difficulty walking or climbing stairs?: Yes Weakness of Legs: Both Weakness of Arms/Hands: None  Permission Sought/Granted                  Emotional Assessment       Orientation: : Oriented to Self, Oriented to Place      Admission diagnosis:  Acute respiratory failure with hypoxia (HCC) [J96.01] AKI (acute kidney injury) (HCC) [N17.9] Acute hypoxemic respiratory failure (HCC) [J96.01] COVID-19 [U07.1] Patient Active Problem List   Diagnosis Date Noted   Acute hypoxemic respiratory failure (HCC) 04/19/2023   UTI (urinary tract infection) 01/20/2023   E. coli UTI 01/02/2023   Bacteremia 12/31/2022   Acute metabolic encephalopathy 12/31/2022   Anemia 12/31/2022   Hypotension 12/31/2022   E coli bacteremia 12/31/2022   Acute encephalopathy 12/30/2022   Lower urinary tract infectious disease 12/30/2022   Weakness 12/30/2022   Leukocytosis 12/30/2022   Bladder cancer (HCC) 08/18/2022   MCI (mild cognitive impairment) 08/18/2022   RSV (respiratory syncytial virus pneumonia) 08/18/2022   Hypoxic respiratory failure (HCC) 08/18/2022   CAP (community acquired pneumonia) 08/17/2022   Atrial fibrillation (HCC) 04/16/2022   Gastroesophageal reflux disease without esophagitis 11/26/2020   Pernicious anemia 11/26/2020   Osteoporosis    Abnormal CXR 01/20/2013   Preop cardiovascular exam 01/20/2013   Surgery, elective 01/18/2013   Fracture of  pubic ramus (HCC) 11/21/2011   Hip pain, acute 11/20/2011   Fall 11/20/2011   Hyponatremia 11/20/2011   Hypokalemia 11/20/2011   Pelvic fracture (HCC) 11/20/2011   HTN (hypertension)    Concussion    History of breast cancer in female    PCP:  [name], MD Pharmacy:   [facility] [address] - [place], [place] - [address] [facility] [place] [address] Phone:  [phone] Fax: [phone]  [id number] [place], [place] - [address] AT [facility] [address] [facility] [address] [place] [address] Phone: [phone] Fax: [phone]     Social Determinants of Health (SDOH) Social History: SDOH Screenings   Food Insecurity: No Food Insecurity (04/20/2023)  Housing: Low Risk  (04/20/2023)  Transportation Needs: No Transportation Needs (04/20/2023)  Utilities: Not At Risk (04/20/2023)  Tobacco Use: Low Risk  (04/19/2023)  Recent Concern: Tobacco Use - Medium Risk (04/07/2023)   Received from [facility]   SDOH Interventions:     Readmission Risk Interventions    01/21/2023   11:14 AM 01/02/2023   11:46 AM  Readmission Risk Prevention Plan  Transportation Screening Complete Complete  PCP or Specialist Appt within 3-5 Days Complete Complete  HRI or Home Care Consult Complete Complete  Social Work Consult for Recovery Care Planning/Counseling Complete   Palliative Care Screening Not Applicable Not Applicable  Medication Review Oceanographer) Complete Complete

## 2023-04-21 DIAGNOSIS — J9601 Acute respiratory failure with hypoxia: Secondary | ICD-10-CM | POA: Diagnosis not present

## 2023-04-21 MED ORDER — POTASSIUM CHLORIDE 20 MEQ PO PACK
40.0000 meq | PACK | Freq: Once | ORAL | Status: AC
  Administered 2023-04-21: 40 meq via ORAL
  Filled 2023-04-21: qty 2

## 2023-04-21 NOTE — Plan of Care (Signed)

## 2023-04-21 NOTE — Evaluation (Signed)
Occupational Therapy Evaluation Patient Details Name: Gina Mcgee MRN: [id number] DOB: [birth year] Today's Date: 04/21/2023   History of Present Illness Patient is a 89 year old female who presented with weakness, and worsening SOB. Patient was found to have O2 saturation of 80% in ED. Of note patient tested positive for COVID 19 three days prior to admission. Patient was admitted with acute hypoxic respiratory failure and acute infections encephalopathy secondary to COVID 19. PMH: a fib, breast cancer, hypertension.   Clinical Impression   Patient is a 89 year old female who was admitted for above.  Patient lives at home with 24/7 caregiver support with supervision and A as needed for ADLs. Patient currently is noted to be extremely weak with inability to tolerate full transition to EOB with increased coughing and fatigue. Patient is motivated and expresses desire to participate in therapy to get stronger. Patient was noted to have decreased functional activity tolernace, decreased ROM, decreased BUE strength, decreased endurance, decreased sitting balance, decreased standing balanced, decreased safety awareness, and decreased knowledge of AE/AD impacting participation in ADLs. Plan is for patient to transition back home with continued 24/7 caregiver support at time of d/c. Patient would continue to benefit from skilled OT services at this time while admitted and after d/c to address noted deficits in order to improve overall safety and independence in ADLs.       Recommendations for follow up therapy are one component of a multi-disciplinary discharge planning process, led by the attending physician.  Recommendations may be updated based on patient status, additional functional criteria and insurance authorization.   Assistance Recommended at Discharge Frequent or constant Supervision/Assistance  Patient can return home with the following Two people to help with walking and/or transfers;A lot of  help with bathing/dressing/bathroom;Assistance with cooking/housework;Direct supervision/assist for medications management;Assist for transportation;Help with stairs or ramp for entrance;Direct supervision/assist for financial management;Assistance with feeding    Functional Status Assessment  Patient has had a recent decline in their functional status and demonstrates the ability to make significant improvements in function in a reasonable and predictable amount of time.  Equipment Recommendations  None recommended by OT       Precautions / Restrictions Precautions Precaution Comments: monitor O2 Restrictions Weight Bearing Restrictions: No      Mobility Bed Mobility Overal bed mobility: Needs Assistance Bed Mobility: Rolling Rolling: Mod assist         General bed mobility comments: with increased time.           ADL either performed or assessed with clinical judgement   ADL Overall ADL's : Needs assistance/impaired Eating/Feeding: Moderate assistance;Bed level Eating/Feeding Details (indicate cue type and reason): clinical impression given current fatigue levels with minimal activity. Grooming: Moderate assistance;Bed level   Upper Body Bathing: Bed level;Maximal assistance   Lower Body Bathing: Bed level;Total assistance   Upper Body Dressing : Bed level;Total assistance   Lower Body Dressing: Total assistance;Bed level     Toilet Transfer Details (indicate cue type and reason): patient was unable to transition to EOB due to fatigue and onset of coughing fit. patient declined to further move at this time. patient TD to reposition in bed with increased time. patient educated on alternating sidelying positions to reduce pressure build up on bottom. patient verbalized understanding. Toileting- Clothing Manipulation and Hygiene: Total assistance;Bed level               Vision Baseline Vision/History: 1 Wears glasses  Pertinent Vitals/Pain  Pain Assessment Pain Assessment: No/denies pain     Hand Dominance Right   Extremity/Trunk Assessment Upper Extremity Assessment Upper Extremity Assessment: Generalized weakness;LUE deficits/detail LUE Deficits / Details: patient reported new onset of weakness in L arm with patient able to AROM wrist for extension but minimal flexion. patient able to grossly grasp therapists fingers with now power behind it. patient reported arm has been weaker since getting sick. 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 in previous admission note   Lower Extremity Assessment Lower Extremity Assessment: Defer to PT evaluation   Cervical / Trunk Assessment Cervical / Trunk Assessment: Kyphotic   Communication Communication Communication: HOH   Cognition Arousal/Alertness: Lethargic Behavior During Therapy: WFL for tasks assessed/performed Overall Cognitive Status: Within Functional Limits for tasks assessed   General Comments: patient was agreeable to session but extremely fatigued with minimal tasks and simply speaking                Home Living Family/patient expects to be discharged to:: Private residence Living Arrangements: Children Available Help at Discharge: Family;Personal care attendant;Available 24 hours/day Type of Home: House Home Access: Level entry     Home Layout: One level     Bathroom Shower/Tub: Walk-in shower;Tub/shower unit   Bathroom Toilet: Standard     Home Equipment: Rollator (4 wheels);Cane - single point;BSC/3in1;Shower seat;Grab bars - toilet;Grab bars - tub/shower;Hand held shower head;Wheelchair - manual   Additional Comments: Has 24 hr assistance between [employer], personal care aide, & daughter. HHPT recently finished-pt agreeable to restart if needed      Prior Functioning/Environment Prior Level of Function : Needs assist             Mobility Comments: rollator works better due to L hand-no functional use.  walking very short distance with min guard A (this varied day-to day) ADLs Comments: Assist with bathing, dressing, sometimes with meals        OT Problem List: Decreased strength;Decreased range of motion;Decreased safety awareness;Decreased activity tolerance;Impaired balance (sitting and/or standing);Decreased knowledge of use of DME or AE;Decreased knowledge of precautions      OT Treatment/Interventions: Self-care/ADL training;Therapeutic exercise;Therapeutic activities;Neuromuscular education;Energy conservation;DME and/or AE instruction;Patient/family education    OT Goals(Current goals can be found in the care plan section) Acute Rehab OT Goals Patient Stated Goal: to go back home OT Goal Formulation: With patient Time For Goal Achievement: 05/05/23 Potential to Achieve Goals: Fair  OT Frequency: Min 2X/week       AM-PAC OT "6 Clicks" Daily Activity     Outcome Measure Help from another person eating meals?: A Lot Help from another person taking care of personal grooming?: A Lot Help from another person toileting, which includes using toliet, bedpan, or urinal?: Total Help from another person bathing (including washing, rinsing, drying)?: Total Help from another person to put on and taking off regular upper body clothing?: A Lot Help from another person to put on and taking off regular lower body clothing?: Total 6 Click Score: 9   End of Session Equipment Utilized During Treatment: Oxygen Nurse Communication: Other (comment) (ok to participate in session)  Activity Tolerance: Patient limited by fatigue;Patient limited by lethargy Patient left: in bed;with call bell/phone within reach;with bed alarm set  OT Visit Diagnosis: Unsteadiness on feet (R26.81);Other abnormalities of gait and mobility (R26.89);Muscle weakness (generalized) (M62.81)                Time: 5409-8119 OT Time Calculation (  min): 15 min Charges:  OT General Charges $OT Visit: 1 Visit OT  Evaluation $OT Eval Low Complexity: 1 Low  Rebbie Lauricella OTR/L, MS Acute Rehabilitation Department Office# [phone]   Selinda Flavin 04/21/2023, 4:17 PM

## 2023-04-21 NOTE — Progress Notes (Signed)
PROGRESS NOTE    Gina Mcgee  AVW:[id number] DOB: [birth year] DOA: 04/19/2023  PCP: [name], MD    Brief Narrative:  This 89 years old female with PMH significant for A-fib, hypertension, breast cancer presented the ED with complaints of cough and shortness of breath.  She was diagnosed 2 days ago with COVID and was seen in the ED and was eventually discharged.  Patient reports feeling very weak and having worsening shortness of breath at home.  She was found to be hypoxic with SpO2 of 80% on room air when she was transported to the ED for further assessment.  Pertinent labs include WBC 14.3 sodium 127 creatinine 1.3.  Patient had CTA chest which ruled out pulmonary embolism but showed large hiatal hernia and pleural effusion.  Patient was admitted for further evaluation.  Assessment & Plan:   Principal Problem:   Acute hypoxemic respiratory failure (HCC)  Acute hypoxic respiratory failure secondary to COVID-19: Acute infectious encephalopathy secondary to COVID-19: Patient was diagnosed with COVID-19  3 days ago and had subjective shortness of breath. She was found to be hypoxic with SpO2 of 80% on room air. Workup in the ED largely unrevealing.  Patient is alert and oriented to self. Continue supplemental oxygen.  Wean as tolerated  Hyponatremia: Likely in the setting of COVID 19. Continue IV hydration. Sodium improving 127 >132 >126  CKD stage IIIb: Creatinine at baseline.  Avoid nephrotoxic medications.  Essential hypertension: Continue  HCTZ, diltiazem and metoprolol.  Dementia:  Continue Namenda  Hypokalemia: Replaced. Continue to monitor.  Leucocytosis: Could be due to Covid. Monitor trend.   DVT prophylaxis: Lovenox Code Status: Full code Family Communication: No family at bed side. Disposition Plan:    Status is: Inpatient Remains inpatient appropriate because: Admitted for acute hypoxic respiratory failure secondary to COVID-19 pneumonia.     Consultants:  None  Procedures: CTA chest  Antimicrobials:  Anti-infectives (From admission, onward)    None       Subjective: Patient was seen and examined at bedside,  overnight events noted. Patient reports doing much better.  She remains on 2 L of supplemental oxygen,  at baseline she does not use oxygen.   Objective: Vitals:   04/20/23 2130 04/21/23 0624 04/21/23 0922 04/21/23 1320  BP: 121/64 132/73 125/61 123/66  Pulse: 87 92 85 86  Resp: 13 14  (!) 22  Temp: 99.6 F (37.6 C) 98.4 F (36.9 C)  98.4 F (36.9 C)  TempSrc: Oral Oral  Oral  SpO2: 97% 96%  96%  Weight:      Height:        Intake/Output Summary (Last 24 hours) at 04/21/2023 1327 Last data filed at 04/21/2023 0925 Gross per 24 hour  Intake 120 ml  Output 600 ml  Net -480 ml   Filed Weights   04/19/23 2337  Weight: 50.8 kg    Examination:  General exam: Appears comfortable, deconditioned, not in any acute distress. Respiratory system: CTA bilaterally.  Respiratory effort normal.  RR 14 Cardiovascular system: S1 & S2 heard, regular rate and rhythm, no murmur,  no pedal edema. Gastrointestinal system: Abdomen is nondistended, soft and nontender. Normal bowel sounds heard. Central nervous system: Alert and oriented x 3. No focal neurological deficits. Extremities: No edema, No cyanosis, No clubbing  Skin: No rashes, lesions or ulcers Psychiatry: Judgement and insight appear normal. Mood & affect appropriate.     Data Reviewed: I have personally reviewed following labs and imaging studies  CBC:  Recent Labs  Lab 04/17/23 1940 04/19/23 1650 04/20/23 0355 04/21/23 0420  WBC 12.6* 14.3* 15.8* 12.7*  NEUTROABS 11.3* 13.3*  --   --   HGB 12.5 11.1* 11.5* 11.3*  HCT 38.0 34.5* 35.9* 35.0*  MCV 86.8 87.1 88.9 88.4  PLT 236 190 195 208   Basic Metabolic Panel: Recent Labs  Lab 04/17/23 1940 04/19/23 1650 04/20/23 0355 04/21/23 0420  NA 135 127* 132* 126*  K 3.3* 3.7 3.2* 3.4*  CL  96* 92* 94* 90*  CO2 29 24 28 25   GLUCOSE 147* 150* 130* 120*  BUN 27* 37* 26* 21  CREATININE 1.01* 1.33* 1.24* 1.08*  CALCIUM 9.6 8.8* 8.8* 8.4*  MG  --   --   --  2.0  PHOS  --   --   --  2.6   GFR: Estimated Creatinine Clearance: 24.9 mL/min (A) (by C-G formula based on SCr of 1.08 mg/dL (H)). Liver Function Tests: No results for input(s): "AST", "ALT", "ALKPHOS", "BILITOT", "PROT", "ALBUMIN" in the last 168 hours. No results for input(s): "LIPASE", "AMYLASE" in the last 168 hours. No results for input(s): "AMMONIA" in the last 168 hours. Coagulation Profile: No results for input(s): "INR", "PROTIME" in the last 168 hours. Cardiac Enzymes: No results for input(s): "CKTOTAL", "CKMB", "CKMBINDEX", "TROPONINI" in the last 168 hours. BNP (last 3 results) No results for input(s): "PROBNP" in the last 8760 hours. HbA1C: No results for input(s): "HGBA1C" in the last 72 hours. CBG: No results for input(s): "GLUCAP" in the last 168 hours. Lipid Profile: No results for input(s): "CHOL", "HDL", "LDLCALC", "TRIG", "CHOLHDL", "LDLDIRECT" in the last 72 hours. Thyroid Function Tests: No results for input(s): "TSH", "T4TOTAL", "FREET4", "T3FREE", "THYROIDAB" in the last 72 hours. Anemia Panel: No results for input(s): "VITAMINB12", "FOLATE", "FERRITIN", "TIBC", "IRON", "RETICCTPCT" in the last 72 hours. Sepsis Labs: No results for input(s): "PROCALCITON", "LATICACIDVEN" in the last 168 hours.  Recent Results (from the past 240 hour(s))  SARS Coronavirus 2 by RT PCR (hospital order, performed in [facility] hospital lab) *cepheid single result test* Anterior Nasal Swab     Status: Abnormal   Collection Time: 04/17/23  6:14 PM   Specimen: Anterior Nasal Swab  Result Value Ref Range Status   SARS Coronavirus 2 by RT PCR POSITIVE (A) NEGATIVE Final    Comment: (NOTE) SARS-CoV-2 target nucleic acids are DETECTED  SARS-CoV-2 RNA is generally detectable in upper respiratory specimens  during  the acute phase of infection.  Positive results are indicative  of the presence of the identified virus, but do not rule out bacterial infection or co-infection with other pathogens not detected by the test.  Clinical correlation with patient history and  other diagnostic information is necessary to determine patient infection status.  The expected result is negative.  Fact Sheet for Patients:   RoadLapTop.co.za   Fact Sheet for Healthcare Providers:   http://kim-miller.com/    This test is not yet approved or cleared by the [place] FDA and  has been authorized for detection and/or diagnosis of SARS-CoV-2 by FDA under an Emergency Use Authorization (EUA).  This EUA will remain in effect (meaning this test can be used) for the duration of  the COVID-19 declaration under Section 564(b)(1)  of the Act, 21 U.S.C. section 360-bbb-3(b)(1), unless the authorization is terminated or revoked sooner.   Performed at [facility], [address] [place], [place] [address]          Radiology Studies: CT Angio Chest  PE W and/or Wo Contrast  Result Date: 04/19/2023 CLINICAL DATA:  Cough. EXAM: CT ANGIOGRAPHY CHEST WITH CONTRAST TECHNIQUE: Multidetector CT imaging of the chest was performed using the standard protocol during bolus administration of intravenous contrast. Multiplanar CT image reconstructions and MIPs were obtained to evaluate the vascular anatomy. RADIATION DOSE REDUCTION: This exam was performed according to the departmental dose-optimization program which includes automated exposure control, adjustment of the mA and/or kV according to patient size and/or use of iterative reconstruction technique. CONTRAST:  65mL OMNIPAQUE IOHEXOL 350 MG/ML SOLN COMPARISON:  January 05, 2023. FINDINGS: Cardiovascular: Satisfactory opacification of the pulmonary arteries to the segmental level. No evidence of pulmonary embolism.  Normal heart size. No pericardial effusion. Mediastinum/Nodes: Large hiatal hernia. No adenopathy. Thyroid gland is unremarkable. Lungs/Pleura: Small left pleural effusion is noted with adjacent subsegmental atelectasis. Stable left upper lobe scarring is noted. Minimal right pleural effusion is noted with adjacent subsegmental atelectasis. Upper Abdomen: No acute abnormality. Musculoskeletal: Multiple unchanged old lower thoracic compression fractures are noted. Review of the MIP images confirms the above findings. IMPRESSION: No definite evidence of pulmonary embolus. Large hiatal hernia. Small left pleural effusion with adjacent subsegmental atelectasis of left lower lobe. Minimal right pleural effusion is noted with adjacent subsegmental atelectasis. Aortic Atherosclerosis (ICD10-I70.0). Electronically Signed   By: Lupita Raider M.D.   On: 04/19/2023 18:36   DG Chest Port 1 View  Result Date: 04/19/2023 CLINICAL DATA:  Shortness of breath EXAM: PORTABLE CHEST 1 VIEW COMPARISON:  April 17, 2023, January 19, 2023 FINDINGS: Evaluation is limited by rotation. The cardiomediastinal silhouette is unchanged in contour.Similar LEFT retrocardiac opacity in comparison to priors. Similar small LEFT pleural effusion. No pneumothorax. No acute pleuroparenchymal abnormality. IMPRESSION: 1. Similar LEFT retrocardiac opacity in comparison to priors. This is favored to reflect atelectasis/scar. 2. Similar small LEFT pleural effusion. Electronically Signed   By: Meda Klinefelter M.D.   On: 04/19/2023 17:32    Scheduled Meds:  apixaban  2.5 mg Oral BID   diltiazem  120 mg Oral QHS   memantine  10 mg Oral BID   metoprolol succinate  25 mg Oral Daily   Continuous Infusions:   LOS: 2 days    Time spent: 35 mins    Willeen Niece, MD [facility]   If 7PM-7AM, please contact night-coverage

## 2023-04-21 NOTE — Evaluation (Addendum)
Physical Therapy Evaluation Patient Details Name: Gina Mcgee MRN: [id number] DOB: 1930/01/27 Today's Date: 04/21/2023  History of Present Illness  Patient is a 89 year old female who presented with weakness, and worsening SOB. Patient was found to have O2 saturation of 80% in ED. Of note patient tested positive for COVID 19 three days prior to admission. Patient was admitted with acute hypoxic respiratory failure and acute infections encephalopathy secondary to COVID 19. PMH: a fib, breast cancer, hypertension.  Clinical Impression  On eval, pt required Max A for bed mobility. Pt unable to maintain static sitting balance at EOB-required at least Min A with tendency to want to fall over forward. Pt is very weak and fatigues quickly/easily with minimal activity. Remained on [place] O2 throughout session. She is a high fall risk. Discussed d/c plan with pt and caregiver-plan is for pt to return home where she has 24/7 caregivers. Will plan to follow and progress activity as safely able.         If plan is discharge home, recommend the following: Assistance with cooking/housework;Assist for transportation;Help with stairs or ramp for entrance;Two people to help with walking and/or transfers;Two people to help with bathing/dressing/bathroom   Can travel by private vehicle        Equipment Recommendations None recommended by PT  Recommendations for Other Services  OT consult    Functional Status Assessment Patient has had a recent decline in their functional status and demonstrates the ability to make significant improvements in function in a reasonable and predictable amount of time.     Precautions / Restrictions Precautions Precautions: Fall Precaution Comments: monitor O2; no functional use of L hand Restrictions Weight Bearing Restrictions: No      Mobility  Bed Mobility Overal bed mobility: Needs Assistance Bed Mobility: Rolling, Supine to Sit, Sit to Supine Rolling: Mod assist    Supine to sit: Max assist Sit to supine: Max assist   General bed mobility comments: Assist for trunk and bil LEs. Increased time. Cues for pt to put forth as much effort as she could. Sat EOB with very poor head/neck/trunk posture-pt barely able to hold her head up. Min A for static sitting balance and to prevent pt from falling over forward. Pt fatigued very easily/quickly.    Transfers                   General transfer comment: Nt-pt unable due to weakness, fatigue. High fall risk    Ambulation/Gait                  Stairs            Wheelchair Mobility     Tilt Bed    Modified Rankin (Stroke Patients Only)       Balance Overall balance assessment: Needs assistance Sitting-balance support: Single extremity supported, Feet supported Sitting balance-Leahy Scale: Poor                                       Pertinent Vitals/Pain Pain Assessment Pain Assessment: No/denies pain    Home Living Family/patient expects to be discharged to:: Private residence Living Arrangements: Children Available Help at Discharge: Family;Personal care attendant;Available 24 hours/day Type of Home: House Home Access: Level entry       Home Layout: One level Home Equipment: Rollator (4 wheels);Cane - single point;BSC/3in1;Shower seat;Grab bars - toilet;Grab bars - tub/shower;Hand held  shower head;Wheelchair - manual Additional Comments: Has 24 hr assistance between [employer], personal care aide, & daughter. HHPT recently finished-pt agreeable to restart if needed    Prior Function Prior Level of Function : Needs assist             Mobility Comments: rollator works better due to L hand-no functional use. walking very short distance with min guard A (this varied day-to day) ADLs Comments: Assist with bathing, dressing, sometimes with meals     Hand Dominance   Dominant Hand: Right    Extremity/Trunk Assessment   Upper Extremity  Assessment Upper Extremity Assessment: Defer to OT evaluation LUE Deficits / Details: (per OT assessment) patient reported new onset of weakness in L arm with patient able to AROM wrist for extension but minimal flexion. patient able to grossly grasp therapists fingers with now power behind it. patient reported arm has been weaker since getting sick. 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 in previous admission note    Lower Extremity Assessment Lower Extremity Assessment: Generalized weakness    Cervical / Trunk Assessment Cervical / Trunk Assessment: Kyphotic  Communication   Communication: HOH  Cognition Arousal/Alertness: Awake/alert Behavior During Therapy: WFL for tasks assessed/performed Overall Cognitive Status: Within Functional Limits for tasks assessed                                 General Comments: patient was agreeable to session but extremely fatigued with minimal tasks and simply speaking        General Comments      Exercises     Assessment/Plan    PT Assessment Patient needs continued PT services  PT Problem List Decreased strength;Decreased range of motion;Decreased activity tolerance;Decreased balance;Decreased mobility;Decreased coordination;Decreased knowledge of use of DME       PT Treatment Interventions DME instruction;Therapeutic exercise;Gait training;Stair training;Functional mobility training;Therapeutic activities;Balance training;Patient/family education    PT Goals (Current goals can be found in the Care Plan section)  Acute Rehab PT Goals Patient Stated Goal: home PT Goal Formulation: With patient Time For Goal Achievement: 05/05/23 Potential to Achieve Goals: Fair    Frequency Min 1X/week     Co-evaluation               AM-PAC PT "6 Clicks" Mobility  Outcome Measure Help needed turning from your back to your side while in a flat bed without using bedrails?: A Lot Help needed moving  from lying on your back to sitting on the side of a flat bed without using bedrails?: A Lot Help needed moving to and from a bed to a chair (including a wheelchair)?: Total Help needed standing up from a chair using your arms (e.g., wheelchair or bedside chair)?: Total Help needed to walk in hospital room?: Total Help needed climbing 3-5 steps with a railing? : Total 6 Click Score: 8    End of Session   Activity Tolerance: Patient limited by fatigue Patient left: in bed;with call bell/phone within reach;with bed alarm set;with family/visitor present   PT Visit Diagnosis: Muscle weakness (generalized) (M62.81)    Time: 1610-9604 PT Time Calculation (min) (ACUTE ONLY): 30 min   Charges:   PT Evaluation $PT Eval Low Complexity: 1 Low PT Treatments $Therapeutic Activity: 8-22 mins PT General Charges $$ ACUTE PT VISIT: 1 Visit            Kathreen Dileo P, PT Acute  Rehabilitation  Office: [phone]

## 2023-04-22 ENCOUNTER — Inpatient Hospital Stay (HOSPITAL_COMMUNITY): Payer: Medicare Other

## 2023-04-22 DIAGNOSIS — J9601 Acute respiratory failure with hypoxia: Secondary | ICD-10-CM | POA: Diagnosis not present

## 2023-04-22 LAB — GLUCOSE, CAPILLARY: Glucose-Capillary: 160 mg/dL — ABNORMAL HIGH (ref 70–99)

## 2023-04-22 LAB — BLOOD GAS, ARTERIAL
Bicarbonate: 23.6 mmol/L (ref 20.0–28.0)
Drawn by: 29503
Expiratory PAP: 7 cmH2O
FIO2: 40 %
Inspiratory PAP: 14 cmH2O
O2 Saturation: 100 %
Patient temperature: 37
pCO2 arterial: 31 mmHg — ABNORMAL LOW (ref 32–48)
pH, Arterial: 7.49 — ABNORMAL HIGH (ref 7.35–7.45)
pO2, Arterial: 103 mmHg (ref 83–108)

## 2023-04-22 MED ORDER — PANTOPRAZOLE SODIUM 40 MG IV SOLR
40.0000 mg | INTRAVENOUS | Status: DC
  Administered 2023-04-22 – 2023-04-24 (×3): 40 mg via INTRAVENOUS
  Filled 2023-04-22 (×3): qty 10

## 2023-04-22 MED ORDER — IPRATROPIUM BROMIDE HFA 17 MCG/ACT IN AERS: 2.0000 | INHALATION_SPRAY | RESPIRATORY_TRACT | Status: DC

## 2023-04-22 MED ORDER — IPRATROPIUM BROMIDE 0.02 % IN SOLN
0.5000 mg | RESPIRATORY_TRACT | Status: DC
  Administered 2023-04-22 – 2023-04-23 (×4): 0.5 mg via RESPIRATORY_TRACT
  Filled 2023-04-22 (×4): qty 2.5

## 2023-04-22 MED ORDER — GUAIFENESIN-DM 100-10 MG/5ML PO SYRP
5.0000 mL | ORAL_SOLUTION | ORAL | Status: DC | PRN
  Administered 2023-04-23 – 2023-04-24 (×3): 5 mL via ORAL
  Filled 2023-04-22 (×2): qty 10

## 2023-04-22 MED ORDER — MAGNESIUM SULFATE IN D5W 1-5 GM/100ML-% IV SOLN
1.0000 g | Freq: Once | INTRAVENOUS | Status: AC
  Administered 2023-04-22: 1 g via INTRAVENOUS
  Filled 2023-04-22: qty 100

## 2023-04-22 MED ORDER — SODIUM PHOSPHATES 45 MMOLE/15ML IV SOLN
30.0000 mmol | Freq: Once | INTRAVENOUS | Status: AC
  Administered 2023-04-22: 30 mmol via INTRAVENOUS
  Filled 2023-04-22: qty 10

## 2023-04-22 MED ORDER — ALBUTEROL SULFATE (2.5 MG/3ML) 0.083% IN NEBU
INHALATION_SOLUTION | RESPIRATORY_TRACT | Status: AC
  Filled 2023-04-22: qty 3

## 2023-04-22 MED ORDER — FUROSEMIDE 10 MG/ML IJ SOLN
40.0000 mg | Freq: Once | INTRAMUSCULAR | Status: AC
  Administered 2023-04-22: 40 mg via INTRAVENOUS
  Filled 2023-04-22: qty 4

## 2023-04-22 MED ORDER — ALBUTEROL SULFATE (2.5 MG/3ML) 0.083% IN NEBU
2.5000 mg | INHALATION_SOLUTION | Freq: Once | RESPIRATORY_TRACT | Status: AC | PRN
  Administered 2023-04-22: 2.5 mg via RESPIRATORY_TRACT

## 2023-04-22 MED ORDER — POTASSIUM CHLORIDE CRYS ER 20 MEQ PO TBCR
40.0000 meq | EXTENDED_RELEASE_TABLET | Freq: Once | ORAL | Status: AC
  Administered 2023-04-22: 40 meq via ORAL
  Filled 2023-04-22: qty 2

## 2023-04-22 MED ORDER — POTASSIUM CHLORIDE 10 MEQ/100ML IV SOLN
10.0000 meq | INTRAVENOUS | Status: AC
  Administered 2023-04-22: 10 meq via INTRAVENOUS
  Filled 2023-04-22: qty 100

## 2023-04-22 NOTE — Plan of Care (Signed)
  Problem: Nutrition: Goal: Adequate nutrition will be maintained Outcome: Progressing   Problem: Pain Managment: Goal: General experience of comfort will improve Outcome: Progressing   Problem: Safety: Goal: Ability to remain free from injury will improve Outcome: Progressing   

## 2023-04-22 NOTE — Care Management Important Message (Signed)
Important Message  Patient Details IM Letter given. Name: RICHELLE FAUSTINI MRN: [id number] Date of Birth: [birth year]   Medicare Important Message Given:  Yes     Caren Macadam 04/22/2023, 11:29 AM

## 2023-04-22 NOTE — Plan of Care (Signed)

## 2023-04-22 NOTE — Progress Notes (Signed)
At 1402: RN notified MD that patient had put out 300 mL urinary output this shift and that it looked like patient had not put out much since the IV Lasix. RN bladder scanned patient to see if patient was retaining and patient had 0 mL in bladder. RN had checked to see if patient had an incontinent episode, which patient had wet brief, part of gown, and bed pad. MD was made aware. MD told RN to continue to monitor. Will pass along to night shift RN.  At 1747: RN notified MD that patient was not able to tolerate the IV potassium runs. Patient only got half of the first bag of potassium and was complaining of it burning and hurting, even with the RN adjusting the rate. MD put in an order for one time dose of oral potassium chloride 40 mEq tablet.

## 2023-04-22 NOTE — Progress Notes (Signed)
Patient's daughter approached RN and wants to inquire about services and/or needs patient might need once patient is discharge. RN addressed with patient's daughter it would be best to discuss with both doctor and transition of care to get the services patient needs when discharged.

## 2023-04-22 NOTE — Progress Notes (Addendum)
PT Cancellation Note  Patient Details Name: KAYLAN FAHMY MRN: [id number] DOB: [birth year]   Cancelled Treatment:    Reason Eval/Treat Not Completed: Medical issues which prohibited therapy --elevated BP/medical issues, will hold PT for now and check back as schedule allows. Thanks.    Faye Ramsay, PT Acute Rehabilitation  Office: [phone]

## 2023-04-22 NOTE — Plan of Care (Signed)
  Problem: Coping: Goal: Level of anxiety will decrease Outcome: Progressing   Problem: Pain Managment: Goal: General experience of comfort will improve Outcome: Progressing   Problem: Safety: Goal: Ability to remain free from injury will improve Outcome: Progressing   

## 2023-04-22 NOTE — Consult Note (Signed)
NAME:  Gina Mcgee, MRN:  [id number], DOB:  02-26-1930, LOS: 3 ADMISSION DATE:  04/19/2023, CONSULTATION DATE:  04/22/23 REFERRING MD:  Willeen Niece, MD CHIEF COMPLAINT:  Respiratory distress   History of Present Illness:  89 year old with below PMHx and recent COVID infection who presented to the ED for shortness of breath and weakness. In the ED she was hypoxemic. CTA negative for PE, large hiatal hernia and small left pleural effusion. Admitted to [facility] for AHRF 2/2 COVID PNA  PCCM consulted for worsening shortness of breath and work of breathing. Remained on 2L O2 with saturations in the 90s. Started on BiPAP and lasix. Repeat CXR with similar left pleural effusion, no infiltrate. GOC discussed with primary team and patient DNR/DNI. Family and staff at bedside reports she was interactive prior to sudden onset of arms shaking and difficulty breathing. Heart rate increasing preceding to event and in the 120s/130s.  Pertinent  Medical History  Atrial fibrillation, HTN, hx breast cancer, HTN, CKD IIIb, Dementia  Significant Hospital Events: Including procedures, antibiotic start and stop dates in addition to other pertinent events   7/28 Admitted to [facility]  Interim History / Subjective:  As above  Objective   Blood pressure (!) 149/108, pulse 97, temperature 99.2 F (37.3 C), temperature source Oral, resp. rate (!) 40, height 5' (1.524 m), weight 50.8 kg, SpO2 95%.    FiO2 (%):  [40 %] 40 %   Intake/Output Summary (Last 24 hours) at 04/22/2023 1127 Last data filed at 04/22/2023 0447 Gross per 24 hour  Intake --  Output 400 ml  Net -400 ml   Filed Weights   04/19/23 2337  Weight: 50.8 kg    Physical Exam: General: Elderly frail-appearing, exhausted HENT: [place], AT, BiPAP in place Eyes: EOMI, no scleral icterus Respiratory: Faint left basilar crackles, no wheezing, diminished air entry Cardiovascular: Irregular rate and rhythm, -M/R/G, no JVD GI: BS+, soft,  nontender Extremities:-Edema,-tenderness Neuro: Eyes closed, opens to voice, follows commands, nods appropriately to questions, CNII-XII grossly intact   CTA 04/19/23 Neg for PE. Large hiatal hernia. Small left pleural effusion with adjacent atelectasis.  Resolved Hospital Problem list     Assessment & Plan:   Acute hypoxemic respiratory failure - suspect flash pulmonary edema in setting AFRVR. Could be triggered by hypoxemia or aspiration. COVID pneumonia Currently requiring BiPAP Wean to supplemental O2 when able. Goal SpO2 >90% Agree with diuresis Scheduled atrovent neb. Unable to tolerate albuterol  If clinically improved, may consider bedside thoracentesis CXR tomorrow  Atrial fibrillation <130s HTN No IV rate control indicated at this time. If persistent can consider IV BB one-time On home metoprolol, hydrochlorothiazide, dilt and apixaban Last dose of Eliquis this morning. Hold for possible thoracentesis tomorrow Goal K >4 and Mg >2 Replete K and Mg   Risk of aspiration Large hiatal hernia Start PPI daily  AKI - improving  Dementia Namenda  GOC DNR/DNI Addressed questions from daughter regarding current care and recommendations above  Pulmonary will continue to follow  Best Practice (right click and "Reselect all SmartList Selections" daily)   Diet/type: NPO DVT prophylaxis: other Hold for procedure GI prophylaxis: PPI Lines: N/A Foley:  N/A Code Status:  DNR Last date of multidisciplinary goals of care discussion [ 04/22/23] Confirmed by primary team  Labs   CBC: Recent Labs  Lab 04/17/23 1940 04/19/23 1650 04/20/23 0355 04/21/23 0420 04/22/23 0443  WBC 12.6* 14.3* 15.8* 12.7* 13.2*  NEUTROABS 11.3* 13.3*  --   --   --  HGB 12.5 11.1* 11.5* 11.3* 11.6*  HCT 38.0 34.5* 35.9* 35.0* 36.6  MCV 86.8 87.1 88.9 88.4 87.1  PLT 236 190 195 208 236    Basic Metabolic Panel: Recent Labs  Lab 04/17/23 1940 04/19/23 1650 04/20/23 0355  04/21/23 0420 04/22/23 0443  NA 135 127* 132* 126* 125*  K 3.3* 3.7 3.2* 3.4* 3.7  CL 96* 92* 94* 90* 91*  CO2 29 24 28 25 23   GLUCOSE 147* 150* 130* 120* 129*  BUN 27* 37* 26* 21 26*  CREATININE 1.01* 1.33* 1.24* 1.08* 1.01*  CALCIUM 9.6 8.8* 8.8* 8.4* 8.6*  MG  --   --   --  2.0 1.9  PHOS  --   --   --  2.6 2.3*   GFR: Estimated Creatinine Clearance: 26.6 mL/min (A) (by C-G formula based on SCr of 1.01 mg/dL (H)). Recent Labs  Lab 04/19/23 1650 04/20/23 0355 04/21/23 0420 04/22/23 0443  WBC 14.3* 15.8* 12.7* 13.2*    Liver Function Tests: No results for input(s): "AST", "ALT", "ALKPHOS", "BILITOT", "PROT", "ALBUMIN" in the last 168 hours. No results for input(s): "LIPASE", "AMYLASE" in the last 168 hours. No results for input(s): "AMMONIA" in the last 168 hours.  ABG    Component Value Date/Time   HCO3 28.8 (H) 08/17/2022 1923   TCO2 30 08/17/2022 1923   O2SAT 84 08/17/2022 1923     Coagulation Profile: No results for input(s): "INR", "PROTIME" in the last 168 hours.  Cardiac Enzymes: No results for input(s): "CKTOTAL", "CKMB", "CKMBINDEX", "TROPONINI" in the last 168 hours.  HbA1C: No results found for: "HGBA1C"  CBG: Recent Labs  Lab 04/22/23 1050  GLUCAP 160*    Review of Systems:   Unable to obtain from patient due to critical illness  Past Medical History:  She,  has a past medical history of A-fib (HCC), Brain concussion (09/22/1958), Cancer (HCC), Concussion (09/22/1958), HTN (hypertension), and Osteoporosis.   Surgical History:   Past Surgical History:  Procedure Laterality Date   BOWEL RESECTION  approx. in 2007   polyp and formed stricture   BREAST LUMPECTOMY  1981   left lumpectomy w/ radiation   COLONOSCOPY W/ POLYPECTOMY     FEMUR SURGERY  2011   left femur pinning   JOINT REPLACEMENT     ORIF HUMERUS FRACTURE Left 01/25/2013   Procedure: OPEN REDUCTION INTERNAL FIXATION (ORIF) DISTAL HUMERUS FRACTURE REPAIR RECONSTRUCTION AND ULNA  NERVE DECOMPRESSION AND ANTERIOR TRANSPOSITION AS NECESSARY;  Surgeon: Dominica Severin, MD;  Location: [facility] OR;  Service: Orthopedics;  Laterality: Left;     Social History:   reports that she has never smoked. She has never used smokeless tobacco. She reports that she does not currently use alcohol. She reports that she does not use drugs.   Family History:  Her family history includes Lung cancer in her father; Stroke in her father and mother.   Allergies Allergies  Allergen Reactions   Aspirin Other (See Comments)    Gets jitter with large doses   Cipro [Ciprofloxacin Hcl] Diarrhea   Doxycycline Diarrhea   Fish Allergy Nausea Only and Other (See Comments)    "makes me jumpy and twitchy, but grand children are allergic"    Nsaids     Can take for short periods of time. Naprosyn -feels dazed   Shellfish-Derived Products Other (See Comments)    "makes me jumpy and twitchy, but grand children are allergic"   Aloe Rash   Latex Rash   Sulfa Antibiotics Rash  Home Medications  Prior to Admission medications   Medication Sig Start Date End Date Taking? Authorizing Provider  acetaminophen (TYLENOL) 500 MG tablet Take 500 mg by mouth every 6 (six) hours as needed for moderate pain.   Yes [provider]  apixaban (ELIQUIS) 2.5 MG TABS tablet Take 1 tablet (2.5 mg total) by mouth 2 (two) times daily. 12/10/22  Yes [name], Manish J, MD  Cholecalciferol (VITAMIN D3) 10 MCG (400 UNIT) CAPS Take 400 Units by mouth 2 (two) times daily.   Yes [provider]  cyanocobalamin (VITAMIN B12) 1000 MCG/ML injection Inject 1,000 mcg into the muscle every 30 (thirty) days.   Yes [provider]  diltiazem (CARDIZEM CD) 120 MG 24 hr capsule Take 1 capsule (120 mg total) by mouth daily. Patient taking differently: Take 120 mg by mouth every evening. 01/30/23  Yes [name], Manish J, MD  ferrous sulfate 325 (65 FE) MG tablet Take 1 tablet (325 mg total) by mouth every 3  (three) days. 01/02/23  Yes Rodolph Bong, MD  hydrochlorothiazide (HYDRODIURIL) 12.5 MG tablet Take 1 tablet (12.5 mg total) by mouth daily. 01/30/23  Yes [name], Manish J, MD  ketoconazole (NIZORAL) 2 % cream Apply 1 Application topically daily as needed for irritation. Apply to toes   Yes [provider]  memantine (NAMENDA) 10 MG tablet Take 10 mg by mouth 2 (two) times daily.   Yes [provider]  metoprolol succinate (TOPROL-XL) 25 MG 24 hr tablet Take 1 tablet (25 mg total) by mouth daily. 01/30/23  Yes [name], [name], MD  Multiple Vitamins-Minerals (MULTIVITAMIN WITH MINERALS) tablet Take 1 tablet by mouth every evening.   Yes [provider]  Saccharomyces boulardii (PROBIOTIC) 250 MG CAPS Take 250 mg by mouth daily.   Yes [provider]  senna-docusate (SENOKOT-S) 8.6-50 MG tablet Take 1 tablet by mouth daily.   Yes [provider]  benzonatate (TESSALON) 100 MG capsule Take 1 capsule (100 mg total) by mouth every 8 (eight) hours. Patient not taking: Reported on 04/19/2023 04/17/23   Jeanelle Malling, PA  nirmatrelvir & ritonavir (PAXLOVID, 300/100,) 20 x 150 MG & 10 x 100MG  TBPK Take 3 tablets by mouth 2 (two) times daily for 5 days. Patient not taking: Reported on 04/19/2023 04/17/23 04/22/23  Jeanelle Malling, PA     Critical care time: 30 min     The patient is critically ill with acute respiratory failure and requires high complexity decision making for assessment and support, frequent evaluation and titration of therapies, application of advanced monitoring technologies and extensive interpretation of multiple databases.  Independent Critical Care Time: 30 Minutes.   Mechele Collin, M.D. [facility] Pulmonary/Critical Care Medicine 04/22/2023 12:35 PM   Please see Amion for pager number to reach on-call Pulmonary and Critical Care Team.

## 2023-04-22 NOTE — Progress Notes (Signed)
Bipap currently on standby in pts room. Pt resting comfortably on 4L [place] at this time.RT will place pt back on bipap if needed throughout night.

## 2023-04-22 NOTE — Progress Notes (Signed)
At 1040: Patient's family was at bedside and had notified RN that patient was having a hard time breathing. RN came to assess patient and noticed that patient was in respiratory distress. Patient was shaking all over and having difficulty breathing, as well as arms flailing. Patient was currently on 2 liters of oxygen. RN notified Charge Nurse, Rapid Response RN, and MD to make them aware of the situation. Charge Nurse, Rapid Response RN, and MD came to the patient's bedside. Vitals were taken and a CBG was obtained, which was 160. Patient could only speak in short sentences and was disoriented to person, place, time, and situation. New orders were placed and patient was placed on BiPAP.   *Please refer to "Care Plan", "Progress Notes", "Consult Note", and "Significant Event" notes for more information as RN agrees with its statements and assessment.

## 2023-04-22 NOTE — Progress Notes (Signed)
   04/22/23 1050  Assess: MEWS Score  Temp 99.2 F (37.3 C)  BP (!) 149/108  MAP (mmHg) 121  Pulse Rate 97  Resp (!) 40  Level of Consciousness Alert  SpO2 96 %  O2 Device Nasal Cannula  Patient Activity (if Appropriate) In bed  O2 Flow Rate (L/min) 2 L/min  Assess: MEWS Score  MEWS Temp 0  MEWS Systolic 0  MEWS Pulse 0  MEWS RR 3  MEWS LOC 0  MEWS Score 3  MEWS Score Color Yellow  Assess: if the MEWS score is Yellow or Red  Were vital signs accurate and taken at a resting state? Yes  Does the patient meet 2 or more of the SIRS criteria? Yes  Does the patient have a confirmed or suspected source of infection? No  MEWS guidelines implemented  Yes, yellow  Treat  MEWS Interventions Considered administering scheduled or prn medications/treatments as ordered  Take Vital Signs  Increase Vital Sign Frequency  Yellow: Q2hr x1, continue Q4hrs until patient remains green for 12hrs  Escalate  MEWS: Escalate Yellow: Discuss with charge nurse and consider notifying provider and/or RRT  Notify: Charge Nurse/RN  Name of Charge Nurse/RN Notified Shearon Stalls, RN  Provider Notification  Provider Name/Title Willeen Niece, MD  Date Provider Notified 04/22/23  Time Provider Notified 1045  Method of Notification Page  Notification Reason Change in status  Provider response See new orders;At bedside  Date of Provider Response 04/22/23  Time of Provider Response 1050  Notify: Rapid Response  Name of Rapid Response RN Notified Rosaria Ferries, RN  Date Rapid Response Notified 04/22/23  Time Rapid Response Notified 1042  Assess: SIRS CRITERIA  SIRS Temperature  0  SIRS Pulse 1  SIRS Respirations  1  SIRS WBC 0  SIRS Score Sum  2

## 2023-04-22 NOTE — Progress Notes (Signed)
PROGRESS NOTE    Gina Mcgee  ZOX:[id number] DOB: [birth year] DOA: 04/19/2023  PCP: [name], MD    Brief Narrative:  This 89 years old female with PMH significant for A-fib, hypertension, breast cancer presented the ED with complaints of cough and shortness of breath.  She was diagnosed 2 days ago with COVID and was seen in the ED and was eventually discharged.  Patient reports feeling very weak and having worsening shortness of breath at home.  She was found to be hypoxic with SpO2 of 80% on room air when she was transported to the ED for further assessment.  Pertinent labs include WBC 14.3 sodium 127 creatinine 1.3.  Patient had CTA chest which ruled out pulmonary embolism but showed large hiatal hernia and pleural effusion.  Patient was admitted for further evaluation.  Assessment & Plan:   Principal Problem:   Acute hypoxemic respiratory failure (HCC)  Acute hypoxic respiratory failure secondary to COVID-19: Acute infectious encephalopathy secondary to COVID-19: Patient was diagnosed with COVID-19  4 days ago and had subjective shortness of breath. She was found to be hypoxic with SpO2 of 80% on room air. Workup in the ED largely unrevealing.  Patient is alert and oriented to self. Continue supplemental oxygen.  Wean as tolerated.  Hyponatremia: Likely in the setting of COVID 19. Continue IV hydration. Sodium improving 127 >132 >126 >125  CKD stage IIIb: Creatinine at baseline.  Avoid nephrotoxic medications.  Essential hypertension: Continue  HCTZ, diltiazem and metoprolol.  Dementia:  Continue Namenda  Hypokalemia: Replaced. Continue to monitor.  Leucocytosis: Could be due to Covid. Monitor trend.   DVT prophylaxis: Lovenox Code Status: Full code Family Communication: No family at bed side. Disposition Plan:    Status is: Inpatient Remains inpatient appropriate because: Admitted for acute hypoxic respiratory failure secondary to COVID-19 pneumonia.     Consultants:  None  Procedures: CTA chest  Antimicrobials:  Anti-infectives (From admission, onward)    None       Subjective: Patient was seen and examined at bedside,  overnight events noted. Patient reports doing much better.  She remains on 2 L of supplemental oxygen,  at baseline she does not use oxygen. Patient states she is doing better and does not want to be discharged today.   Objective: Vitals:   04/22/23 1050 04/22/23 1125 04/22/23 1200 04/22/23 1300  BP: (!) 149/108  128/78 (!) 120/91  Pulse: 97  (!) 120 82  Resp: (!) 40  (!) 39 (!) 28  Temp: 99.2 F (37.3 C)   98.9 F (37.2 C)  TempSrc: Oral   Axillary  SpO2: 96% 95% 98% 100%  Weight:      Height:        Intake/Output Summary (Last 24 hours) at 04/22/2023 1433 Last data filed at 04/22/2023 0447 Gross per 24 hour  Intake --  Output 400 ml  Net -400 ml   Filed Weights   04/19/23 2337  Weight: 50.8 kg    Examination:  General exam: Appears comfortable, deconditioned, not in any acute distress. Respiratory system: CTA bilaterally.  Respiratory effort normal.  RR 15 Cardiovascular system: S1 & S2 heard, regular rate and rhythm, no murmur,  no pedal edema. Gastrointestinal system: Abdomen is non distended, soft and non tender. Normal bowel sounds heard. Central nervous system: Alert and oriented x 3. No focal neurological deficits. Extremities: No edema, No cyanosis, No clubbing  Skin: No rashes, lesions or ulcers Psychiatry: Judgement and insight appear normal. Mood & affect  appropriate.     Data Reviewed: I have personally reviewed following labs and imaging studies  CBC: Recent Labs  Lab 04/17/23 1940 04/19/23 1650 04/20/23 0355 04/21/23 0420 04/22/23 0443  WBC 12.6* 14.3* 15.8* 12.7* 13.2*  NEUTROABS 11.3* 13.3*  --   --   --   HGB 12.5 11.1* 11.5* 11.3* 11.6*  HCT 38.0 34.5* 35.9* 35.0* 36.6  MCV 86.8 87.1 88.9 88.4 87.1  PLT 236 190 195 208 236   Basic Metabolic Panel: Recent  Labs  Lab 04/17/23 1940 04/19/23 1650 04/20/23 0355 04/21/23 0420 04/22/23 0443  NA 135 127* 132* 126* 125*  K 3.3* 3.7 3.2* 3.4* 3.7  CL 96* 92* 94* 90* 91*  CO2 29 24 28 25 23   GLUCOSE 147* 150* 130* 120* 129*  BUN 27* 37* 26* 21 26*  CREATININE 1.01* 1.33* 1.24* 1.08* 1.01*  CALCIUM 9.6 8.8* 8.8* 8.4* 8.6*  MG  --   --   --  2.0 1.9  PHOS  --   --   --  2.6 2.3*   GFR: Estimated Creatinine Clearance: 26.6 mL/min (A) (by C-G formula based on SCr of 1.01 mg/dL (H)). Liver Function Tests: No results for input(s): "AST", "ALT", "ALKPHOS", "BILITOT", "PROT", "ALBUMIN" in the last 168 hours. No results for input(s): "LIPASE", "AMYLASE" in the last 168 hours. No results for input(s): "AMMONIA" in the last 168 hours. Coagulation Profile: No results for input(s): "INR", "PROTIME" in the last 168 hours. Cardiac Enzymes: No results for input(s): "CKTOTAL", "CKMB", "CKMBINDEX", "TROPONINI" in the last 168 hours. BNP (last 3 results) No results for input(s): "PROBNP" in the last 8760 hours. HbA1C: No results for input(s): "HGBA1C" in the last 72 hours. CBG: Recent Labs  Lab 04/22/23 1050  GLUCAP 160*   Lipid Profile: No results for input(s): "CHOL", "HDL", "LDLCALC", "TRIG", "CHOLHDL", "LDLDIRECT" in the last 72 hours. Thyroid Function Tests: No results for input(s): "TSH", "T4TOTAL", "FREET4", "T3FREE", "THYROIDAB" in the last 72 hours. Anemia Panel: No results for input(s): "VITAMINB12", "FOLATE", "FERRITIN", "TIBC", "IRON", "RETICCTPCT" in the last 72 hours. Sepsis Labs: No results for input(s): "PROCALCITON", "LATICACIDVEN" in the last 168 hours.  Recent Results (from the past 240 hour(s))  SARS Coronavirus 2 by RT PCR (hospital order, performed in [facility] hospital lab) *cepheid single result test* Anterior Nasal Swab     Status: Abnormal   Collection Time: 04/17/23  6:14 PM   Specimen: Anterior Nasal Swab  Result Value Ref Range Status   SARS Coronavirus 2 by RT PCR  POSITIVE (A) NEGATIVE Final    Comment: (NOTE) SARS-CoV-2 target nucleic acids are DETECTED  SARS-CoV-2 RNA is generally detectable in upper respiratory specimens  during the acute phase of infection.  Positive results are indicative  of the presence of the identified virus, but do not rule out bacterial infection or co-infection with other pathogens not detected by the test.  Clinical correlation with patient history and  other diagnostic information is necessary to determine patient infection status.  The expected result is negative.  Fact Sheet for Patients:   RoadLapTop.co.za   Fact Sheet for Healthcare Providers:   http://kim-miller.com/    This test is not yet approved or cleared by the [place] FDA and  has been authorized for detection and/or diagnosis of SARS-CoV-2 by FDA under an Emergency Use Authorization (EUA).  This EUA will remain in effect (meaning this test can be used) for the duration of  the COVID-19 declaration under Section 564(b)(1)  of the  Act, 21 U.S.C. section 360-bbb-3(b)(1), unless the authorization is terminated or revoked sooner.   Performed at [facility], [address] [place], [place] [address]          Radiology Studies: DG Chest [facility] 1 View  Result Date: 04/22/2023 CLINICAL DATA:  Acute respiratory distress EXAM: PORTABLE CHEST 1 VIEW COMPARISON:  04/19/2023 and previous FINDINGS: Right lung remains clear. Large hiatal hernia again noted. Abnormal density in the left lower chest relating to pleural fluid and left lower lung volume loss/infiltrate appears similar. Indistinct density at the left apex appears similar. No worsening or new finding. IMPRESSION: No change since yesterday. Large hiatal hernia. Left pleural fluid and left lower lung volume loss/infiltrate. Indistinct density at the left apex. Electronically Signed   By: Paulina Fusi M.D.   On: 04/22/2023 12:03     Scheduled Meds:  albuterol       diltiazem  120 mg Oral QHS   ipratropium  0.5 mg Nebulization Q4H   memantine  10 mg Oral BID   metoprolol succinate  25 mg Oral Daily   pantoprazole (PROTONIX) IV  40 mg Intravenous Q24H   Continuous Infusions:  magnesium sulfate bolus IVPB     potassium chloride     sodium phosphate 30 mmol in dextrose 5 % 250 mL infusion 30 mmol (04/22/23 0927)     LOS: 3 days    Time spent: 35 mins    Willeen Niece, MD [facility]   If 7PM-7AM, please contact night-coverage

## 2023-04-22 NOTE — Care Plan (Signed)
  Interdisciplinary Goals of Care Family Meeting   Date carried out: 04/22/2023  Location of the meeting: Bedside  Member's involved: Physician, Bedside Registered Nurse, Family Member or next of kin, and Other: Son Renoda Loury  Durable Power of Attorney or acting medical decision maker:  Jerold Coombe    Discussion: We discussed goals of care for [employer] .    Code status:   Code Status: DNR   Disposition: Continue current acute care  Time spent for the meeting: 25 mins    Willeen Niece, MD  04/22/2023, 11:20 AM

## 2023-04-22 NOTE — Significant Event (Addendum)
Rapid Response Event Note   Reason for Call :  Respiratory distress    Initial Focused Assessment:  Patient in bed with heavy, labored breathing. Family at bedside during assessment. Tachypnea RR 35-40. On 2 L [place], SpO2 95%. Heart rate 120s. SBP 130s. Awakens to voice and can follow commands, but is disoriented x3. Speaking in short sentences, when asked how she feels, patient states "Im fine".   Started on BIPAP and work of breathing eased, but still tachypneic with RR 25-33. SpO2 98%. EKG completed once on BIPAP and showed sinus tachycardia with PACs-MD aware.   Lungs diminished bilaterally, very slight fine crackles LLL.  Lasix administered per order.   Reassessed at 1400- Still on BIPAP but able to state her name and where she is, recognizes family at bedside, Able to speak in more fluent sentences at this time. Heart rate improved to 107 bpm, RR 20-29 while in room  Interventions:  BIPAP and chest x ray ordered from Rapid response protocol.  Verbal order for Atrovent and Albuterol nebulizer per Idelle Leech MD.  40mg  IV Lasix ordered by Idelle Leech MD.  Changed to DNR after family discussion by Idelle Leech MD  Plan of Care:  Monitor on BIPAP for a few hours with lasix on board.    Event Summary:   MD Notified: Idelle Leech MD by bedside staff prior to arrival Call Time: 1042 Arrival Time: 1047 End Time: 1200  Rosaria Ferries, RN

## 2023-04-22 NOTE — Progress Notes (Signed)
   04/22/23 2115  BiPAP/CPAP/SIPAP  BiPAP/CPAP/SIPAP V60  Reason BIPAP/CPAP not in use Other(comment) (standby)

## 2023-04-22 NOTE — Care Plan (Signed)
Patient started having heavy and labored breathing. She was tachycardic,  tachypneic,  hypertensive.  She was following commands.  Speaking in short sentences.  Started on BiPAP and was given IV Lasix.  Chest x-ray showed flash pulmonary edema.  PCCM consulted.  Goals of care discussed with family.  Son and daughter who is healthcare power of attorney has made patient DNR/DNI.  Will continue to monitor.  Patient was reassessed after placed on BiPAP she feels much calmer and improved.

## 2023-04-23 ENCOUNTER — Inpatient Hospital Stay (HOSPITAL_COMMUNITY): Payer: Medicare Other

## 2023-04-23 DIAGNOSIS — J9601 Acute respiratory failure with hypoxia: Secondary | ICD-10-CM | POA: Diagnosis not present

## 2023-04-23 MED ORDER — IPRATROPIUM BROMIDE 0.02 % IN SOLN
0.5000 mg | Freq: Four times a day (QID) | RESPIRATORY_TRACT | Status: DC
  Administered 2023-04-24: 0.5 mg via RESPIRATORY_TRACT
  Filled 2023-04-23: qty 2.5

## 2023-04-23 MED ORDER — APIXABAN 2.5 MG PO TABS
2.5000 mg | ORAL_TABLET | Freq: Two times a day (BID) | ORAL | Status: DC
  Administered 2023-04-23 – 2023-04-25 (×4): 2.5 mg via ORAL
  Filled 2023-04-23 (×4): qty 1

## 2023-04-23 MED ORDER — IPRATROPIUM BROMIDE 0.02 % IN SOLN
0.5000 mg | Freq: Four times a day (QID) | RESPIRATORY_TRACT | Status: DC
  Administered 2023-04-23 (×2): 0.5 mg via RESPIRATORY_TRACT
  Filled 2023-04-23 (×3): qty 2.5

## 2023-04-23 MED ORDER — FUROSEMIDE 10 MG/ML IJ SOLN
40.0000 mg | Freq: Every day | INTRAMUSCULAR | Status: DC
  Administered 2023-04-23 – 2023-04-25 (×3): 40 mg via INTRAVENOUS
  Filled 2023-04-23 (×3): qty 4

## 2023-04-23 NOTE — Progress Notes (Signed)
PROGRESS NOTE    MAHREEN HILTUNEN  FAO:[id number] DOB: Sep 25, 1929 DOA: 04/19/2023  PCP: [name], MD    Brief Narrative:  This 89 years old female with PMH significant for A-fib, hypertension, breast cancer presented the ED with complaints of cough and shortness of breath.  She was diagnosed 2 days ago with COVID and was seen in the ED and was eventually discharged.  Patient reports feeling very weak and having worsening shortness of breath at home.  She was found to be hypoxic with SpO2 of 80% on room air when she was transported to the ED for further assessment.  Pertinent labs include WBC 14.3 sodium 127 creatinine 1.3.  Patient had CTA chest which ruled out pulmonary embolism but showed large hiatal hernia and pleural effusion.  Patient was admitted for further evaluation.  Assessment & Plan:   Principal Problem:   Acute hypoxemic respiratory failure (HCC)  Acute hypoxic respiratory failure secondary to COVID-19: Acute infectious encephalopathy secondary to COVID-19: Patient was diagnosed with COVID-19  5 days ago and had subjective shortness of breath. She was found to be hypoxic with SpO2 of 80% on room air. Workup in the ED largely unrevealing.  Patient is alert and oriented to self. Continue supplemental oxygen.  Wean as tolerated. 7/31: Respiratory distress likely due to flash pulmonary edema in the setting of A-fib with RVR. Given Lasix and placed on BiPAP has significantly improved. Pulmonologist agreed with continue IV diuresis as tolerated. No plan for thoracocentesis due to clinical improvement and no amenable site for drainage. Continue supplemental oxygen and wean as tolerated.  Hyponatremia: Likely in the setting of COVID 19. Continue IV hydration. Sodium improving 127 >132 >126 >125  CKD stage IIIb: Creatinine at baseline.  Avoid nephrotoxic medications.  Essential hypertension: Continue  HCTZ, diltiazem and metoprolol.  Dementia:  Continue  Namenda.  Hypokalemia: Replaced. Continue to monitor.  Leucocytosis: Could be due to Covid. Monitor trend.  Atrial fibrillation: Heart rate now controlled.  Continue metoprolol diltiazem and Eliquis.   DVT prophylaxis: Lovenox Code Status: Full code Family Communication: No family at bed side. Disposition Plan:    Status is: Inpatient Remains inpatient appropriate because: Admitted for acute hypoxic respiratory failure secondary to COVID-19 pneumonia.    Consultants:  None  Procedures: CTA chest  Antimicrobials:  Anti-infectives (From admission, onward)    None       Subjective: Patient was seen and examined at bedside,  overnight events noted. Patient reports doing much better.  She remains on 2 L of supplemental oxygen. She anticipate that she would be discharged tomorrow if she remains better.   Objective: Vitals:   04/23/23 0800 04/23/23 1000 04/23/23 1100 04/23/23 1204  BP: (!) 96/57 109/69    Pulse: 81 85  88  Resp: 20 20  (!) 24  Temp:   98.3 F (36.8 C)   TempSrc:   Oral   SpO2: 99% 99%  97%  Weight:      Height:        Intake/Output Summary (Last 24 hours) at 04/23/2023 1331 Last data filed at 04/23/2023 0446 Gross per 24 hour  Intake 530 ml  Output 600 ml  Net -70 ml   Filed Weights   04/19/23 2337  Weight: 50.8 kg    Examination:  General exam: Appears comfortable, deconditioned, not in any acute distress. Respiratory system: CTA bilaterally.  Respiratory effort normal.  RR 14 Cardiovascular system: S1 & S2 heard, regular rate and rhythm, no murmur,  no  pedal edema. Gastrointestinal system: Abdomen is non distended, soft and non tender. Normal bowel sounds heard. Central nervous system: Alert and oriented x 3. No focal neurological deficits. Extremities: No edema, No cyanosis, No clubbing  Skin: No rashes, lesions or ulcers Psychiatry: Judgement and insight appear normal. Mood & affect appropriate.     Data Reviewed: I have  personally reviewed following labs and imaging studies  CBC: Recent Labs  Lab 04/17/23 1940 04/19/23 1650 04/20/23 0355 04/21/23 0420 04/22/23 0443  WBC 12.6* 14.3* 15.8* 12.7* 13.2*  NEUTROABS 11.3* 13.3*  --   --   --   HGB 12.5 11.1* 11.5* 11.3* 11.6*  HCT 38.0 34.5* 35.9* 35.0* 36.6  MCV 86.8 87.1 88.9 88.4 87.1  PLT 236 190 195 208 236   Basic Metabolic Panel: Recent Labs  Lab 04/17/23 1940 04/19/23 1650 04/20/23 0355 04/21/23 0420 04/22/23 0443  NA 135 127* 132* 126* 125*  K 3.3* 3.7 3.2* 3.4* 3.7  CL 96* 92* 94* 90* 91*  CO2 29 24 28 25 23   GLUCOSE 147* 150* 130* 120* 129*  BUN 27* 37* 26* 21 26*  CREATININE 1.01* 1.33* 1.24* 1.08* 1.01*  CALCIUM 9.6 8.8* 8.8* 8.4* 8.6*  MG  --   --   --  2.0 1.9  PHOS  --   --   --  2.6 2.3*   GFR: Estimated Creatinine Clearance: 26.6 mL/min (A) (by C-G formula based on SCr of 1.01 mg/dL (H)). Liver Function Tests: No results for input(s): "AST", "ALT", "ALKPHOS", "BILITOT", "PROT", "ALBUMIN" in the last 168 hours. No results for input(s): "LIPASE", "AMYLASE" in the last 168 hours. No results for input(s): "AMMONIA" in the last 168 hours. Coagulation Profile: No results for input(s): "INR", "PROTIME" in the last 168 hours. Cardiac Enzymes: No results for input(s): "CKTOTAL", "CKMB", "CKMBINDEX", "TROPONINI" in the last 168 hours. BNP (last 3 results) No results for input(s): "PROBNP" in the last 8760 hours. HbA1C: No results for input(s): "HGBA1C" in the last 72 hours. CBG: Recent Labs  Lab 04/22/23 1050  GLUCAP 160*   Lipid Profile: No results for input(s): "CHOL", "HDL", "LDLCALC", "TRIG", "CHOLHDL", "LDLDIRECT" in the last 72 hours. Thyroid Function Tests: No results for input(s): "TSH", "T4TOTAL", "FREET4", "T3FREE", "THYROIDAB" in the last 72 hours. Anemia Panel: No results for input(s): "VITAMINB12", "FOLATE", "FERRITIN", "TIBC", "IRON", "RETICCTPCT" in the last 72 hours. Sepsis Labs: No results for  input(s): "PROCALCITON", "LATICACIDVEN" in the last 168 hours.  Recent Results (from the past 240 hour(s))  SARS Coronavirus 2 by RT PCR (hospital order, performed in [facility] hospital lab) *cepheid single result test* Anterior Nasal Swab     Status: Abnormal   Collection Time: 04/17/23  6:14 PM   Specimen: Anterior Nasal Swab  Result Value Ref Range Status   SARS Coronavirus 2 by RT PCR POSITIVE (A) NEGATIVE Final    Comment: (NOTE) SARS-CoV-2 target nucleic acids are DETECTED  SARS-CoV-2 RNA is generally detectable in upper respiratory specimens  during the acute phase of infection.  Positive results are indicative  of the presence of the identified virus, but do not rule out bacterial infection or co-infection with other pathogens not detected by the test.  Clinical correlation with patient history and  other diagnostic information is necessary to determine patient infection status.  The expected result is negative.  Fact Sheet for Patients:   RoadLapTop.co.za   Fact Sheet for Healthcare Providers:   http://kim-miller.com/    This test is not yet approved or cleared  by the [place] and  has been authorized for detection and/or diagnosis of SARS-CoV-2 by FDA under an Emergency Use Authorization (EUA).  This EUA will remain in effect (meaning this test can be used) for the duration of  the COVID-19 declaration under Section 564(b)(1)  of the Act, 21 U.S.C. section 360-bbb-3(b)(1), unless the authorization is terminated or revoked sooner.   Performed at [facility], [address], [place], [place] [address]          Radiology Studies: DG CHEST PORT 1 VIEW  Result Date: 04/23/2023 CLINICAL DATA:  [id number] Pleural effusion on left [id number] EXAM: PORTABLE CHEST 1 VIEW COMPARISON:  CXR 04/22/23 FINDINGS: Unchanged likely small left-sided pleural effusion. Low lung volumes. Compared to prior exam there is  interval increase in bilateral perihilar opacities, which could represent pulmonary venous congestion or infection. No radiographically apparent new displaced rib fracture. Visualized upper abdomen unremarkable. IMPRESSION: 1. Unchanged small left-sided pleural effusion. 2. Compared to prior exam there is interval increase in bilateral perihilar opacities, which could represent pulmonary venous congestion or infection. Electronically Signed   By: Lorenza Cambridge M.D.   On: 04/23/2023 08:00   DG Chest Port 1 View  Result Date: 04/22/2023 CLINICAL DATA:  Acute respiratory distress EXAM: PORTABLE CHEST 1 VIEW COMPARISON:  04/19/2023 and previous FINDINGS: Right lung remains clear. Large hiatal hernia again noted. Abnormal density in the left lower chest relating to pleural fluid and left lower lung volume loss/infiltrate appears similar. Indistinct density at the left apex appears similar. No worsening or new finding. IMPRESSION: No change since yesterday. Large hiatal hernia. Left pleural fluid and left lower lung volume loss/infiltrate. Indistinct density at the left apex. Electronically Signed   By: Paulina Fusi M.D.   On: 04/22/2023 12:03    Scheduled Meds:  apixaban  2.5 mg Oral BID   diltiazem  120 mg Oral QHS   furosemide  40 mg Intravenous Daily   ipratropium  0.5 mg Nebulization Q6H   memantine  10 mg Oral BID   metoprolol succinate  25 mg Oral Daily   pantoprazole (PROTONIX) IV  40 mg Intravenous Q24H   Continuous Infusions:     LOS: 4 days    Time spent: 35 mins    Willeen Niece, MD [facility]   If 7PM-7AM, please contact night-coverage

## 2023-04-23 NOTE — Plan of Care (Signed)
  Problem: Education: Goal: Knowledge of General Education information will improve Description: Including pain rating scale, medication(s)/side effects and non-pharmacologic comfort measures Outcome: Progressing   Problem: Health Behavior/Discharge Planning: Goal: Ability to manage health-related needs will improve Outcome: Progressing   Problem: Nutrition: Goal: Adequate nutrition will be maintained Outcome: Progressing   Problem: Coping: Goal: Level of anxiety will decrease Outcome: Progressing   Problem: Elimination: Goal: Will not experience complications related to bowel motility Outcome: Progressing Goal: Will not experience complications related to urinary retention Outcome: Progressing   Problem: Pain Managment: Goal: General experience of comfort will improve Outcome: Progressing   Problem: Safety: Goal: Ability to remain free from injury will improve Outcome: Progressing

## 2023-04-23 NOTE — Consult Note (Signed)
NAME:  Gina Mcgee, MRN:  [id number], DOB:  1930-05-30, LOS: 4 ADMISSION DATE:  04/19/2023, CONSULTATION DATE:  04/22/23 REFERRING MD:  Willeen Niece, MD CHIEF COMPLAINT:  Respiratory distress   History of Present Illness:  89 year old with below PMHx and recent COVID infection who presented to the ED for shortness of breath and weakness. In the ED she was hypoxemic. CTA negative for PE, large hiatal hernia and small left pleural effusion. Admitted to [facility] for AHRF 2/2 COVID PNA  PCCM consulted for worsening shortness of breath and work of breathing. Remained on 2L O2 with saturations in the 90s. Started on BiPAP and lasix. Repeat CXR with similar left pleural effusion, no infiltrate. GOC discussed with primary team and patient DNR/DNI. Family and staff at bedside reports she was interactive prior to sudden onset of arms shaking and difficulty breathing. Heart rate increasing preceding to event and in the 120s/130s.  Pertinent  Medical History  Atrial fibrillation, HTN, hx breast cancer, HTN, CKD IIIb, Dementia  Significant Hospital Events: Including procedures, antibiotic start and stop dates in addition to other pertinent events   7/28 Admitted to [facility]  Interim History / Subjective:  Wore BiPAP for 4-5 hours yesterday afternoon and then weaned to [place] This morning on 2L [place] with SpO2 99% in bed  Objective   Blood pressure (!) 96/57, pulse 81, temperature 98.2 F (36.8 C), temperature source Oral, resp. rate 20, height 5' (1.524 m), weight 50.8 kg, SpO2 99%.    FiO2 (%):  [40 %] 40 %   Intake/Output Summary (Last 24 hours) at 04/23/2023 0943 Last data filed at 04/23/2023 0446 Gross per 24 hour  Intake 530 ml  Output 600 ml  Net -70 ml   Filed Weights   04/19/23 2337  Weight: 50.8 kg    Physical Exam: General: Elderly frail-appearing, exhausted HENT: [place], AT, BiPAP in place Eyes: EOMI, no scleral icterus Respiratory: Faint left basilar crackles, no wheezing, diminished air  entry Cardiovascular: Irregular rate and rhythm, -M/R/G, no JVD GI: BS+, soft, nontender Extremities:-Edema,-tenderness Neuro: Eyes closed, opens to voice, follows commands, nods appropriately to questions, CNII-XII grossly intact  Physical Exam: General: Elderly, frail-appearing, no acute distress HENT: [place], AT, OP clear, MMM Eyes: EOMI, no scleral icterus Respiratory: Diminished with faint basilar crackle.  No  wheezing or rales Cardiovascular: RRR, -M/R/G, no JVD GI: BS+, soft, nontender Extremities:-Edema,-tenderness Neuro: AAO x4, CNII-XII grossly intact Psych: Normal mood, normal affect  CTA 04/19/23 Neg for PE. Large hiatal hernia. Small left pleural effusion with adjacent atelectasis. CXR 04/23/23 Small left sided pleural effusion, low lung volumes, increased perihilar opacities  Bedside ultrasound of left thorax - very small window on the lateral aspect that is variable with breaths. Otherwise normal lung exam. No amenable site for thoracentesis   Resolved Hospital Problem list     Assessment & Plan:   Acute hypoxemic respiratory failure - Small left pleural effusion, atelectasis. Suspect acute event was from flash pulmonary edema in setting AFRVR. Could be triggered by hypoxemia or aspiration. Oxygenation has improved today COVID pneumonia Wean O2. Goal SpO2 >90% BiPAP as needed Agree wit diuresis as tolerated Scheduled atrovent neb. Unable to tolerate albuterol  No plan for thoracentesis due to clinical improvement and no amenable site for drainage  Atrial fibrillation - now rate controlled HTN On home metoprolol, hydrochlorothiazide, dilt and apixaban Restart home Eliquis Goal K >4 and Mg >2 Replete K and Mg   Risk of aspiration Large hiatal hernia Start  PPI daily  AKI - improving  Dementia Namenda  GOC DNR/DNI  Pulmonary available as needed  Best Practice (right click and "Reselect all SmartList Selections" daily)   Per primary team Diet/type:  NPO DVT prophylaxis: DOAC  GI prophylaxis: PPI Lines: N/A Foley:  N/A Code Status:  DNR Last date of multidisciplinary goals of care discussion [ 04/22/23] Confirmed by primary team    Critical care time: N/A    Care Time: 50 min Mechele Collin, M.D. [facility] Pulmonary/Critical Care Medicine 04/23/2023 9:43 AM   See Amion for personal pager For hours between 7 PM to 7 AM, please call Elink for urgent questions

## 2023-04-23 NOTE — Progress Notes (Signed)
Physical Therapy Treatment Patient Details Name: Gina Mcgee MRN: [id number] DOB: Dec 16, 1929 Today's Date: 04/23/2023   History of Present Illness Patient is a 89 year old female who presented with weakness, and worsening SOB. Patient was found to have O2 saturation of 80% in ED. Of note patient tested positive for COVID 19 three days prior to admission. Patient was admitted with acute hypoxic respiratory failure and acute infections encephalopathy secondary to COVID 19. PMH: a fib, breast cancer, hypertension.7/31: Respiratory distress likely due to flash pulmonary edema in the setting of A-fib with RVR.    PT Comments  Patient  presents with significant weakness, poor sitting balance, max/total assistance for mobility, Attempted standing x 2 with inability to hold self. Family present. Patient may require Hospital bed and /WC if plans are to return home depending on family support. Currently requires  total assistance.    Patient maintained on  2 L,, SPO2 remained >93%, HR 114.   If plan is discharge home, recommend the following: Assistance with cooking/housework;Assist for transportation;Help with stairs or ramp for entrance;Two people to help with walking and/or transfers;Two people to help with bathing/dressing/bathroom   Can travel by private vehicle      no  Equipment Recommendations  Wheelchair (measurements PT);Wheelchair cushion (measurements PT);Hospital bed    Recommendations for Other Services       Precautions / Restrictions Precautions Precautions: Fall Precaution Comments: monitor O2; no functional use of L hand Restrictions Weight Bearing Restrictions: No     Mobility  Bed Mobility Overal bed mobility: Needs Assistance Bed Mobility: Rolling, Supine to Sit, Sit to Supine Rolling: Max assist, +2 for physical assistance, +2 for safety/equipment   Supine to sit: +2 for safety/equipment, +2 for physical assistance, Total assist Sit to supine: Total assist, +2 for  physical assistance, +2 for safety/equipment   General bed mobility comments: Assist for trunk and bil LEs. Increased time.. Sat EOB with very poor head/neck/trunk posture-pt barely able to hold her head up. Max  A for static sitting balance and to prevent pt from falling over forward. Pt fatigued very easily/quickly.    Transfers Overall transfer level: Needs assistance Equipment used: Rollator (4 wheels) Transfers: Sit to/from Stand Sit to Stand: +2 physical assistance, +2 safety/equipment, Max assist           General transfer comment: patient assisted to stand from bed at rollator, poor head neck  control, severely forward flexed posture, required support at shoulders upper trunk to prevent falling forward.    Ambulation/Gait                   Stairs             Wheelchair Mobility     Tilt Bed    Modified Rankin (Stroke Patients Only)       Balance Overall balance assessment: Needs assistance Sitting-balance support: Single extremity supported, Feet supported Sitting balance-Leahy Scale: Zero     Standing balance support: Single extremity supported, During functional activity, Reliant on assistive device for balance Standing balance-Leahy Scale: Poor                              Cognition Arousal/Alertness: Awake/alert Behavior During Therapy: WFL for tasks assessed/performed                                   General Comments:  patient was agreeable to session but extremely fatigued with minimal tasks and simply speaking        Exercises      General Comments        Pertinent Vitals/Pain Pain Assessment Pain Assessment: Faces Faces Pain Scale: Hurts whole lot Pain Location: low back, moans Pain Descriptors / Indicators: Discomfort, Grimacing, Guarding, Moaning Pain Intervention(s): Limited activity within patient's tolerance, Monitored during session, Repositioned    Home Living                           Prior Function            PT Goals (current goals can now be found in the care plan section) Progress towards PT goals: Not progressing toward goals - comment (significant weakness)    Frequency    Min 1X/week      PT Plan Current plan remains appropriate    Co-evaluation              AM-PAC PT "6 Clicks" Mobility   Outcome Measure  Help needed turning from your back to your side while in a flat bed without using bedrails?: Total Help needed moving from lying on your back to sitting on the side of a flat bed without using bedrails?: Total Help needed moving to and from a bed to a chair (including a wheelchair)?: Total Help needed standing up from a chair using your arms (e.g., wheelchair or bedside chair)?: Total Help needed to walk in hospital room?: Total Help needed climbing 3-5 steps with a railing? : Total 6 Click Score: 6    End of Session Equipment Utilized During Treatment: Gait belt Activity Tolerance: Patient limited by fatigue Patient left: in bed;with call bell/phone within reach;with bed alarm set;with family/visitor present;with nursing/sitter in room Nurse Communication: Mobility status PT Visit Diagnosis: Muscle weakness (generalized) (M62.81)     Time: 1535-1610 PT Time Calculation (min) (ACUTE ONLY): 35 min  Charges:    $Therapeutic Activity: 23-37 mins PT General Charges $$ ACUTE PT VISIT: 1 Visit                     Blanchard Kelch PT Acute Rehabilitation Services Office [phone] Weekend pager-[phone]    Rada Hay 04/23/2023, 4:19 PM

## 2023-04-24 DIAGNOSIS — J9601 Acute respiratory failure with hypoxia: Secondary | ICD-10-CM | POA: Diagnosis not present

## 2023-04-24 MED ORDER — GUAIFENESIN-DM 100-10 MG/5ML PO SYRP: 5.0000 mL | ORAL_SOLUTION | ORAL | 0 refills | Status: AC | PRN

## 2023-04-24 MED ORDER — FUROSEMIDE 40 MG PO TABS: 40.0000 mg | ORAL_TABLET | Freq: Every day | ORAL | 1 refills | Status: DC

## 2023-04-24 MED ORDER — LEVALBUTEROL HCL 0.63 MG/3ML IN NEBU: 0.6300 mg | INHALATION_SOLUTION | Freq: Four times a day (QID) | RESPIRATORY_TRACT | Status: DC | PRN

## 2023-04-24 NOTE — Discharge Summary (Signed)
Physician Discharge Summary  Gina Mcgee ZOX:[id number] DOB: Jul 31, 1930 DOA: 04/19/2023  PCP: [name], MD  Admit date: 04/19/2023  Discharge date: 04/24/2023  Admitted From:Home.  Disposition:  Home Health Services.  Recommendations for Outpatient Follow-up:  Follow up with PCP in 1-2 weeks. Please obtain BMP/CBC in one week. Advised to continue supplemental oxygen as needed. Advised to take Lasix 40 mg p.o. daily for left-sided pleural effusion. Will request PCP to repeat chest x-ray.  Home Health: Home PT/OT Equipment/Devices:Home oxygen @ 2-3 L/min.  Discharge Condition: Stable CODE STATUS:DNR Diet recommendation: Heart Healthy   Brief Summary/ Hospital Course: This 89 years old female with PMH significant for A-fib, hypertension, breast cancer presented the ED with complaints of cough and shortness of breath. She was diagnosed 2 days ago with COVID and was seen in the ED and was eventually discharged. Patient reports feeling very weak and having worsening shortness of breath at home. She was found to be hypoxic with SpO2 of 80% on room air when she was transported  back to the ED for further assessment. Pertinent labs include WBC 14.3 sodium 127 creatinine 1.3. Patient had CTA chest which ruled out pulmonary embolism but showed large hiatal hernia and pleural effusion. Patient was admitted for further evaluation.  Patient was continued on supplemental oxygen.  She was also given Lasix and BiPAP following an episode of respiratory distress due to flash pulmonary edema.  PCCM was consulted,  initial plan was that she might need thoracocentesis but given clinical improvement and minimal effusion pulmonologist recommended no need for thoracocentesis,  continue diuresis.  Sodium has improved.  Physical therapy has recommended home health services.  Patient successfully weaned down to 2 to 3 L of oxygen.  Patient does qualify for home oxygen based on ambulatory oxygen saturation.  Home  health services arranged.  Patient is being discharged home.  Discharge Diagnoses:  Principal Problem:   Acute hypoxemic respiratory failure (HCC)  Acute hypoxic respiratory failure secondary to COVID-19: Acute infectious encephalopathy secondary to COVID-19: Patient was diagnosed with COVID-19  7 days ago and had subjective shortness of breath. She was found to be hypoxic with SpO2 of 80% on room air. Workup in the ED largely unrevealing.  Patient is alert and oriented to self. Continue supplemental oxygen.  Wean as tolerated. 7/31: Respiratory distress likely due to flash pulmonary edema in the setting of A-fib with RVR. Given Lasix and placed on BiPAP has significantly improved. Pulmonologist agreed with continue IV diuresis as tolerated. No plan for thoracocentesis due to clinical improvement and no amenable site for drainage. Continue supplemental oxygen and wean as tolerated.   Hyponatremia: Likely in the setting of COVID 19. Continue IV hydration.  Sodium improving 127 >132 >126 >125 > 129   CKD stage IIIb: Creatinine at baseline.  Avoid nephrotoxic medications.   Essential hypertension: Continue  HCTZ, diltiazem and metoprolol.   Dementia:  Continue Namenda.   Hypokalemia: Replaced. Continue to monitor.   Leucocytosis: Could be due to Covid. Monitor trend.   Atrial fibrillation: Heart rate now controlled.  Continue metoprolol diltiazem and Eliquis.  Discharge Instructions  Discharge Instructions     Call MD for:  difficulty breathing, headache or visual disturbances   Complete by: As directed    Call MD for:  persistant dizziness or light-headedness   Complete by: As directed    Call MD for:  persistant nausea and vomiting   Complete by: As directed    Diet - low sodium heart healthy  Complete by: As directed    Diet Carb Modified   Complete by: As directed    Discharge instructions   Complete by: As directed    Advised to follow-up with primary care  physician in 1 week. Advised to continue supplemental oxygen as needed. Advised to take Lasix 40 mg p.o. daily for left-sided pleural effusion. Will request PCP to repeat chest x-ray.   Increase activity slowly   Complete by: As directed       Allergies as of 04/24/2023       Reactions   Aspirin Other (See Comments)   Gets jitter with large doses   Cipro [ciprofloxacin Hcl] Diarrhea   Doxycycline Diarrhea   Fish Allergy Nausea Only, Other (See Comments)   "makes me jumpy and twitchy, but grand children are allergic"    Nsaids    Can take for short periods of time. Naprosyn -feels dazed   Shellfish-derived Products Other (See Comments)   "makes me jumpy and twitchy, but grand children are allergic"   Aloe Rash   Latex Rash   Sulfa Antibiotics Rash        Medication List     STOP taking these medications    benzonatate 100 MG capsule Commonly known as: TESSALON   hydrochlorothiazide 12.5 MG tablet Commonly known as: HYDRODIURIL   Paxlovid (300/100) 20 x 150 MG & 10 x 100MG  Tbpk Generic drug: nirmatrelvir & ritonavir       TAKE these medications    acetaminophen 500 MG tablet Commonly known as: TYLENOL Take 500 mg by mouth every 6 (six) hours as needed for moderate pain.   apixaban 2.5 MG Tabs tablet Commonly known as: Eliquis Take 1 tablet (2.5 mg total) by mouth 2 (two) times daily.   cyanocobalamin 1000 MCG/ML injection Commonly known as: VITAMIN B12 Inject 1,000 mcg into the muscle every 30 (thirty) days.   diltiazem 120 MG 24 hr capsule Commonly known as: CARDIZEM CD Take 1 capsule (120 mg total) by mouth daily. What changed: when to take this   ferrous sulfate 325 (65 FE) MG tablet Take 1 tablet (325 mg total) by mouth every 3 (three) days.   furosemide 40 MG tablet Commonly known as: Lasix Take 1 tablet (40 mg total) by mouth daily.   guaiFENesin-dextromethorphan 100-10 MG/5ML syrup Commonly known as: ROBITUSSIN DM Take 5 mLs by mouth every  4 (four) hours as needed for cough (chest congestion).   ketoconazole 2 % cream Commonly known as: NIZORAL Apply 1 Application topically daily as needed for irritation. Apply to toes   memantine 10 MG tablet Commonly known as: NAMENDA Take 10 mg by mouth 2 (two) times daily.   metoprolol succinate 25 MG 24 hr tablet Commonly known as: TOPROL-XL Take 1 tablet (25 mg total) by mouth daily.   multivitamin with minerals tablet Take 1 tablet by mouth every evening.   Probiotic 250 MG Caps Take 250 mg by mouth daily.   senna-docusate 8.6-50 MG tablet Commonly known as: Senokot-S Take 1 tablet by mouth daily.   Vitamin D3 10 MCG (400 UNIT) Caps Take 400 Units by mouth 2 (two) times daily.               Durable Medical Equipment  (From admission, onward)           Start     Ordered   04/24/23 1215  For home use only DME Hospital bed  Once       Comments: Therapeutic Mattress.  Question Answer Comment  Length of Need Lifetime   Patient has (list medical condition): generalised weakness   The above medical condition requires: Patient requires the ability to reposition frequently   Head must be elevated greater than: 45 degrees   Bed type Semi-electric   Hoyer Lift Yes   Trapeze Bar Yes   Support Surface: Gel Overlay      04/24/23 1214   04/24/23 1149  For home use only DME oxygen  Once       Question Answer Comment  Length of Need Lifetime   Mode or (Route) Nasal cannula   Liters per Minute 2   Frequency Continuous (stationary and portable oxygen unit needed)   Oxygen conserving device Yes   Oxygen delivery system Gas      04/24/23 1148            Follow-up Information     [facility], [facility] Follow up.   Why: Home Health will follow up with you 24 to 48hrs after d/c. Contact information: [address] [place] [address] [phone]         [name], MD Follow up in 1 week(s).   Specialty: Family Medicine Contact  information: [address] [place] [address] [phone]                Allergies  Allergen Reactions   Aspirin Other (See Comments)    Gets jitter with large doses   Cipro [Ciprofloxacin Hcl] Diarrhea   Doxycycline Diarrhea   Fish Allergy Nausea Only and Other (See Comments)    "makes me jumpy and twitchy, but grand children are allergic"    Nsaids     Can take for short periods of time. Naprosyn -feels dazed   Shellfish-Derived Products Other (See Comments)    "makes me jumpy and twitchy, but grand children are allergic"   Aloe Rash   Latex Rash   Sulfa Antibiotics Rash    Consultations: Pulmonology   Procedures/Studies: DG CHEST PORT 1 VIEW  Result Date: 04/23/2023 CLINICAL DATA:  [id number] Pleural effusion on left [id number] EXAM: PORTABLE CHEST 1 VIEW COMPARISON:  CXR 04/22/23 FINDINGS: Unchanged likely small left-sided pleural effusion. Low lung volumes. Compared to prior exam there is interval increase in bilateral perihilar opacities, which could represent pulmonary venous congestion or infection. No radiographically apparent new displaced rib fracture. Visualized upper abdomen unremarkable. IMPRESSION: 1. Unchanged small left-sided pleural effusion. 2. Compared to prior exam there is interval increase in bilateral perihilar opacities, which could represent pulmonary venous congestion or infection. Electronically Signed   By: Lorenza Cambridge M.D.   On: 04/23/2023 08:00   DG Chest Port 1 View  Result Date: 04/22/2023 CLINICAL DATA:  Acute respiratory distress EXAM: PORTABLE CHEST 1 VIEW COMPARISON:  04/19/2023 and previous FINDINGS: Right lung remains clear. Large hiatal hernia again noted. Abnormal density in the left lower chest relating to pleural fluid and left lower lung volume loss/infiltrate appears similar. Indistinct density at the left apex appears similar. No worsening or new finding. IMPRESSION: No change since yesterday. Large hiatal hernia. Left pleural  fluid and left lower lung volume loss/infiltrate. Indistinct density at the left apex. Electronically Signed   By: Paulina Fusi M.D.   On: 04/22/2023 12:03   CT Angio Chest PE W and/or Wo Contrast  Result Date: 04/19/2023 CLINICAL DATA:  Cough. EXAM: CT ANGIOGRAPHY CHEST WITH CONTRAST TECHNIQUE: Multidetector CT imaging of the chest was performed using the standard protocol during bolus administration  of intravenous contrast. Multiplanar CT image reconstructions and MIPs were obtained to evaluate the vascular anatomy. RADIATION DOSE REDUCTION: This exam was performed according to the departmental dose-optimization program which includes automated exposure control, adjustment of the mA and/or kV according to patient size and/or use of iterative reconstruction technique. CONTRAST:  65mL OMNIPAQUE IOHEXOL 350 MG/ML SOLN COMPARISON:  January 05, 2023. FINDINGS: Cardiovascular: Satisfactory opacification of the pulmonary arteries to the segmental level. No evidence of pulmonary embolism. Normal heart size. No pericardial effusion. Mediastinum/Nodes: Large hiatal hernia. No adenopathy. Thyroid gland is unremarkable. Lungs/Pleura: Small left pleural effusion is noted with adjacent subsegmental atelectasis. Stable left upper lobe scarring is noted. Minimal right pleural effusion is noted with adjacent subsegmental atelectasis. Upper Abdomen: No acute abnormality. Musculoskeletal: Multiple unchanged old lower thoracic compression fractures are noted. Review of the MIP images confirms the above findings. IMPRESSION: No definite evidence of pulmonary embolus. Large hiatal hernia. Small left pleural effusion with adjacent subsegmental atelectasis of left lower lobe. Minimal right pleural effusion is noted with adjacent subsegmental atelectasis. Aortic Atherosclerosis (ICD10-I70.0). Electronically Signed   By: Lupita Raider M.D.   On: 04/19/2023 18:36   DG Chest Port 1 View  Result Date: 04/19/2023 CLINICAL DATA:   Shortness of breath EXAM: PORTABLE CHEST 1 VIEW COMPARISON:  April 17, 2023, January 19, 2023 FINDINGS: Evaluation is limited by rotation. The cardiomediastinal silhouette is unchanged in contour.Similar LEFT retrocardiac opacity in comparison to priors. Similar small LEFT pleural effusion. No pneumothorax. No acute pleuroparenchymal abnormality. IMPRESSION: 1. Similar LEFT retrocardiac opacity in comparison to priors. This is favored to reflect atelectasis/scar. 2. Similar small LEFT pleural effusion. Electronically Signed   By: Meda Klinefelter M.D.   On: 04/19/2023 17:32   DG Chest Portable 1 View  Result Date: 04/17/2023 CLINICAL DATA:  Cough, fever, recent positive COVID test EXAM: PORTABLE CHEST 1 VIEW COMPARISON:  01/19/2023 FINDINGS: Transverse diameter of heart is increased. Apparent shift of mediastinum to the left may be due to rotation. There is a hiatal hernia in retrocardiac region. There are no signs of pulmonary edema or focal pulmonary consolidation. Posterolateral aspect of left fifth and sixth ribs are indistinct. IMPRESSION: Cardiomegaly. There are no signs of pulmonary edema or focal pulmonary consolidation. Hiatal hernia. Posterolateral aspect of left fifth and sixth ribs are indistinct. Follow-up routine radiographs of the left ribs may be considered for further evaluation. Electronically Signed   By: Ernie Avena M.D.   On: 04/17/2023 19:03     Subjective: Patient was seen and examined at bedside.  Overnight events noted.   Patient reports doing much better and wants to be discharged.   Home health services arranged and patient is being discharged home.  Discharge Exam: Vitals:   04/24/23 0902 04/24/23 1030  BP:    Pulse:    Resp:    Temp:  97.8 F (36.6 C)  SpO2: 96%    Vitals:   04/24/23 0516 04/24/23 0600 04/24/23 0902 04/24/23 1030  BP: 100/67 113/69    Pulse: 74 72    Resp: 18 (!) 23    Temp: 98.1 F (36.7 C)   97.8 F (36.6 C)  TempSrc: Oral   Oral   SpO2: 100% 99% 96%   Weight:      Height:        General: Pt is alert, awake, not in acute distress Cardiovascular: RRR, S1/S2 +, no rubs, no gallops Respiratory: CTA bilaterally, no wheezing, no rhonchi Abdominal: Soft, NT, ND, bowel sounds +  Extremities: no edema, no cyanosis    The results of significant diagnostics from this hospitalization (including imaging, microbiology, ancillary and laboratory) are listed below for reference.     Microbiology: Recent Results (from the past 240 hour(s))  SARS Coronavirus 2 by RT PCR (hospital order, performed in [facility] hospital lab) *cepheid single result test* Anterior Nasal Swab     Status: Abnormal   Collection Time: 04/17/23  6:14 PM   Specimen: Anterior Nasal Swab  Result Value Ref Range Status   SARS Coronavirus 2 by RT PCR POSITIVE (A) NEGATIVE Final    Comment: (NOTE) SARS-CoV-2 target nucleic acids are DETECTED  SARS-CoV-2 RNA is generally detectable in upper respiratory specimens  during the acute phase of infection.  Positive results are indicative  of the presence of the identified virus, but do not rule out bacterial infection or co-infection with other pathogens not detected by the test.  Clinical correlation with patient history and  other diagnostic information is necessary to determine patient infection status.  The expected result is negative.  Fact Sheet for Patients:   RoadLapTop.co.za   Fact Sheet for Healthcare Providers:   http://kim-miller.com/    This test is not yet approved or cleared by the [place] FDA and  has been authorized for detection and/or diagnosis of SARS-CoV-2 by FDA under an Emergency Use Authorization (EUA).  This EUA will remain in effect (meaning this test can be used) for the duration of  the COVID-19 declaration under Section 564(b)(1)  of the Act, 21 U.S.C. section 360-bbb-3(b)(1), unless the authorization is terminated or  revoked sooner.   Performed at [facility], [address], [place], [place] [address]      Labs: BNP (last 3 results) Recent Labs    07/24/22 1123 01/05/23 1657  BNP 111.3* 355.5*   Basic Metabolic Panel: Recent Labs  Lab 04/19/23 1650 04/20/23 0355 04/21/23 0420 04/22/23 0443 04/24/23 0413  NA 127* 132* 126* 125* 129*  K 3.7 3.2* 3.4* 3.7 3.8  CL 92* 94* 90* 91* 94*  CO2 24 28 25 23 24   GLUCOSE 150* 130* 120* 129* 118*  BUN 37* 26* 21 26* 28*  CREATININE 1.33* 1.24* 1.08* 1.01* 1.07*  CALCIUM 8.8* 8.8* 8.4* 8.6* 8.5*  MG  --   --  2.0 1.9 2.3  PHOS  --   --  2.6 2.3* 3.5   Liver Function Tests: No results for input(s): "AST", "ALT", "ALKPHOS", "BILITOT", "PROT", "ALBUMIN" in the last 168 hours. No results for input(s): "LIPASE", "AMYLASE" in the last 168 hours. No results for input(s): "AMMONIA" in the last 168 hours. CBC: Recent Labs  Lab 04/17/23 1940 04/19/23 1650 04/20/23 0355 04/21/23 0420 04/22/23 0443 04/24/23 0413  WBC 12.6* 14.3* 15.8* 12.7* 13.2* 15.4*  NEUTROABS 11.3* 13.3*  --   --   --   --   HGB 12.5 11.1* 11.5* 11.3* 11.6* 10.8*  HCT 38.0 34.5* 35.9* 35.0* 36.6 34.2*  MCV 86.8 87.1 88.9 88.4 87.1 90.2  PLT 236 190 195 208 236 243   Cardiac Enzymes: No results for input(s): "CKTOTAL", "CKMB", "CKMBINDEX", "TROPONINI" in the last 168 hours. BNP: Invalid input(s): "POCBNP" CBG: Recent Labs  Lab 04/22/23 1050  GLUCAP 160*   D-Dimer No results for input(s): "DDIMER" in the last 72 hours. Hgb A1c No results for input(s): "HGBA1C" in the last 72 hours. Lipid Profile No results for input(s): "CHOL", "HDL", "LDLCALC", "TRIG", "CHOLHDL", "LDLDIRECT" in the last 72 hours. Thyroid function studies No  results for input(s): "TSH", "T4TOTAL", "T3FREE", "THYROIDAB" in the last 72 hours.  Invalid input(s): "FREET3" Anemia work up No results for input(s): "VITAMINB12", "FOLATE", "FERRITIN", "TIBC", "IRON", "RETICCTPCT" in  the last 72 hours. Urinalysis    Component Value Date/Time   COLORURINE YELLOW 01/19/2023 2203   APPEARANCEUR HAZY (A) 01/19/2023 2203   LABSPEC 1.020 01/19/2023 2203   PHURINE 5.5 01/19/2023 2203   GLUCOSEU NEGATIVE 01/19/2023 2203   HGBUR MODERATE (A) 01/19/2023 2203   BILIRUBINUR NEGATIVE 01/19/2023 2203   KETONESUR NEGATIVE 01/19/2023 2203   PROTEINUR 30 (A) 01/19/2023 2203   NITRITE POSITIVE (A) 01/19/2023 2203   LEUKOCYTESUR LARGE (A) 01/19/2023 2203   Sepsis Labs Recent Labs  Lab 04/20/23 0355 04/21/23 0420 04/22/23 0443 04/24/23 0413  WBC 15.8* 12.7* 13.2* 15.4*   Microbiology Recent Results (from the past 240 hour(s))  SARS Coronavirus 2 by RT PCR (hospital order, performed in [facility] hospital lab) *cepheid single result test* Anterior Nasal Swab     Status: Abnormal   Collection Time: 04/17/23  6:14 PM   Specimen: Anterior Nasal Swab  Result Value Ref Range Status   SARS Coronavirus 2 by RT PCR POSITIVE (A) NEGATIVE Final    Comment: (NOTE) SARS-CoV-2 target nucleic acids are DETECTED  SARS-CoV-2 RNA is generally detectable in upper respiratory specimens  during the acute phase of infection.  Positive results are indicative  of the presence of the identified virus, but do not rule out bacterial infection or co-infection with other pathogens not detected by the test.  Clinical correlation with patient history and  other diagnostic information is necessary to determine patient infection status.  The expected result is negative.  Fact Sheet for Patients:   RoadLapTop.co.za   Fact Sheet for Healthcare Providers:   http://kim-miller.com/    This test is not yet approved or cleared by the [place] FDA and  has been authorized for detection and/or diagnosis of SARS-CoV-2 by FDA under an Emergency Use Authorization (EUA).  This EUA will remain in effect (meaning this test can be used) for the duration of  the  COVID-19 declaration under Section 564(b)(1)  of the Act, 21 U.S.C. section 360-bbb-3(b)(1), unless the authorization is terminated or revoked sooner.   Performed at [facility], [address], [place], [place] [address]      Time coordinating discharge: Over 30 minutes  SIGNED:   Willeen Niece, MD  [facility] 04/24/2023, 4:55 PM Pager   If 7PM-7AM, please contact night-coverage

## 2023-04-24 NOTE — TOC Transition Note (Signed)
Transition of Care [facility]) - CM/SW Discharge Note   Patient Details  Name: Gina Mcgee MRN: [id number] Date of Birth: 11/09/1929  Transition of Care [facility]) CM/SW Contact:  Larrie Kass, LCSW Phone Number: 04/24/2023, 11:37 AM   Clinical Narrative:    CSW spoke with pt's son Makaylynn Bonillas, he stated his concerns about pt's current Home Health agency. Pt's son is requesting [facility] for pt's [facility]. Cindie with [facility], pt was accepted for HHPT/OT/SW/Aide. Pt's son is requesting a home O2 and hospital bed. He reports that pt already has a wheelchair at home. he reports pt lives with his sister and has an adie who stays with her overnight.  Pt's son stated he will transport pt home once ready for d/c.   Pt's son does not have a preference for DME company. CSW sent a referral to [facility] for Hospital bed and home O2. Pt will need orders and stat notes. Once the note is in, pt's O2 will be delivered to pt's room before d/c.   CSW spoke with pt's daughter to provide and update on pt's d/c plan.    Patient Goals and CMS Choice      Discharge Placement                         Discharge Plan and Services Additional resources added to the After Visit Summary for                                       Social Determinants of Health (SDOH) Interventions SDOH Screenings   Food Insecurity: No Food Insecurity (04/20/2023)  Housing: Low Risk  (04/20/2023)  Transportation Needs: No Transportation Needs (04/20/2023)  Utilities: Not At Risk (04/20/2023)  Tobacco Use: Low Risk  (04/19/2023)  Recent Concern: Tobacco Use - Medium Risk (04/07/2023)   Received from [facility]     Readmission Risk Interventions    01/21/2023   11:14 AM 01/02/2023   11:46 AM  Readmission Risk Prevention Plan  Transportation Screening Complete Complete  PCP or Specialist Appt within 3-5 Days Complete Complete  HRI or Home Care Consult Complete Complete  Social Work Consult for Recovery Care  Planning/Counseling Complete   Palliative Care Screening Not Applicable Not Applicable  Medication Review Oceanographer) Complete Complete

## 2023-04-24 NOTE — Discharge Instructions (Signed)
Advised to follow-up with primary care physician in 1 week. Advised to continue supplemental oxygen as needed. Advised to take Lasix 40 mg p.o. daily for left-sided pleural effusion. Will request PCP to repeat chest x-ray.

## 2023-04-24 NOTE — Progress Notes (Signed)
MEWS Progress Note  Patient Details Name: Gina Mcgee MRN: [id number] DOB: 04/27/1930 Today's Date: 04/24/2023   MEWS Flowsheet Documentation:  Assess: MEWS Score Temp: 98 F (36.7 C) BP: (!) 93/54 MAP (mmHg): 67 Pulse Rate: 78 ECG Heart Rate: 80 Resp: (!) 25 Level of Consciousness: Alert SpO2: 100 % O2 Device: Nasal Cannula Patient Activity (if Appropriate): In bed O2 Flow Rate (L/min): 2 L/min FiO2 (%): 40 % Assess: MEWS Score MEWS Temp: 0 MEWS Systolic: 1 MEWS Pulse: 0 MEWS RR: 1 MEWS LOC: 0 MEWS Score: 2 MEWS Score Color: Yellow Assess: SIRS CRITERIA SIRS Temperature : 0 SIRS Respirations : 1 SIRS Pulse: 0 SIRS WBC: 0 SIRS Score Sum : 1 SIRS Temperature : 0 SIRS Pulse: 0 SIRS Respirations : 1 SIRS WBC: 0 SIRS Score Sum : 1 Assess: if the MEWS score is Yellow or Red Were vital signs accurate and taken at a resting state?: Yes Does the patient meet 2 or more of the SIRS criteria?: Yes Does the patient have a confirmed or suspected source of infection?: No MEWS guidelines implemented : Yes, yellow Treat MEWS Interventions: Considered administering scheduled or prn medications/treatments as ordered Take Vital Signs Increase Vital Sign Frequency : Yellow: Q2hr x1, continue Q4hrs until patient remains green for 12hrs Escalate MEWS: Escalate: Yellow: Discuss with charge nurse and consider notifying provider and/or RRT        Laurence Compton 04/24/2023, 12:33 AM

## 2023-04-24 NOTE — Progress Notes (Signed)
SATURATION QUALIFICATIONS: (This note is used to comply with regulatory documentation for home oxygen)  Patient Saturations on Room Air at Rest = 94%  Patient Saturations on Room Air while Ambulating/ standing  = 77%  Patient Saturations on 2 Liters of oxygen while Ambulating = 97%  Please briefly explain why patient needs home oxygen: Patient being treated for Acute hypoxemic respiratory failure. Patient was not able to ambulate and while standing oxygen saturation drop, unsafe to ambulate.

## 2023-04-24 NOTE — Plan of Care (Signed)
  Problem: Health Behavior/Discharge Planning: Goal: Ability to manage health-related needs will improve Outcome: Progressing   Problem: Clinical Measurements: Goal: Ability to maintain clinical measurements within normal limits will improve Outcome: Progressing Goal: Diagnostic test results will improve Outcome: Progressing   Problem: Coping: Goal: Level of anxiety will decrease Outcome: Progressing   Problem: Elimination: Goal: Will not experience complications related to bowel motility Outcome: Progressing

## 2023-04-24 NOTE — Care Management Important Message (Signed)
Important Message  Patient Details IM Letter given Name: Gina Mcgee MRN: [id number] Date of Birth: June 29, 1930   Medicare Important Message Given:  Yes     Caren Macadam 04/24/2023, 2:47 PM

## 2023-04-25 DIAGNOSIS — J9601 Acute respiratory failure with hypoxia: Secondary | ICD-10-CM | POA: Diagnosis not present

## 2023-04-25 NOTE — Plan of Care (Signed)
  Problem: Safety: Goal: Ability to remain free from injury will improve Outcome: Progressing   Problem: Pain Managment: Goal: General experience of comfort will improve Outcome: Progressing   Problem: Coping: Goal: Level of anxiety will decrease Outcome: Progressing   

## 2023-04-25 NOTE — Progress Notes (Addendum)
PROGRESS NOTE    Gina Mcgee  MVH:[id number] DOB: 1929/12/26 DOA: 04/19/2023  PCP: [name], MD    Brief Narrative:  This 89 years old female with PMH significant for A-fib, hypertension, breast cancer presented the ED with complaints of cough and shortness of breath. She was diagnosed 2 days ago with COVID and was seen in the ED and was eventually discharged. Patient reports feeling very weak and having worsening shortness of breath at home. She was found to be hypoxic with SpO2 of 80% on room air when she was transported  back to the ED for further assessment. Pertinent labs include WBC 14.3 sodium 127 creatinine 1.3. Patient had CTA chest which ruled out pulmonary embolism but showed large hiatal hernia and pleural effusion. Patient was admitted for further evaluation.  Patient was continued on supplemental oxygen.  She was also given Lasix and BiPAP following an episode of respiratory distress due to flash pulmonary edema.  PCCM was consulted,  initial plan was that she might need thoracocentesis but given clinical improvement and minimal effusion pulmonologist recommended no need for thoracocentesis,  continue diuresis.  Sodium has improved.  Physical therapy has recommended home health services. Patient was successfully weaned down to 2 to 3 L of oxygen.  Patient does qualify for home oxygen based on ambulatory oxygen saturation. Home health services arranged.  Patient was discharged yesterday but She couldn't go because oxygen and hospital bed not delivered yet.  She is being discharged today.   Assessment & Plan:   Principal Problem:   Acute hypoxemic respiratory failure (HCC)  Acute hypoxic respiratory failure secondary to COVID-19: Acute infectious encephalopathy secondary to COVID-19: Patient was diagnosed with COVID-19  5 days ago and had subjective shortness of breath. She was found to be hypoxic with SpO2 of 80% on room air. Workup in the ED largely unrevealing.  Patient  is alert and oriented to self. Continue supplemental oxygen.  Wean as tolerated. 7/31: Respiratory distress likely due to flash pulmonary edema in the setting of A-fib with RVR. Given Lasix and placed on BiPAP has significantly improved. Pulmonologist agreed with continue IV diuresis as tolerated. No plan for thoracocentesis due to clinical improvement and no amenable site for drainage. Continue supplemental oxygen and wean as tolerated.  Hyponatremia: Likely in the setting of COVID 19. Continue IV hydration. Sodium improving 127 >132 >126 >125 >129  CKD stage IIIb: Creatinine at baseline. Avoid nephrotoxic medications.  Essential hypertension: Continue  HCTZ, diltiazem and metoprolol.  Dementia:  Continue Namenda.  Hypokalemia: Replaced. Continue to monitor.  Leucocytosis: Could be due to Covid. Monitor trend.  Atrial fibrillation: Heart rate now controlled.  Continue metoprolol diltiazem and Eliquis.   DVT prophylaxis: Lovenox Code Status: DNR Family Communication: No family at bed side. Disposition Plan:    Status is: Inpatient Remains inpatient appropriate because: Admitted for acute hypoxic respiratory failure secondary to COVID-19 pneumonia.    Consultants:  None  Procedures: CTA chest  Antimicrobials:  Anti-infectives (From admission, onward)    None       Subjective: Patient was seen and examined at bedside,  overnight events noted. Patient report doing much better.  She remains on 2 L of supplemental oxygen. She was discharged yesterday but home oxygen and hospital bed was not delivered so patient being discharged today.   Objective: Vitals:   04/25/23 0400 04/25/23 0500 04/25/23 0800 04/25/23 1000  BP: 120/84 128/76 136/83 (!) 145/135  Pulse: 90 89  92  Resp: (!) 30 (!)  22 19 20   Temp:  98.9 F (37.2 C)    TempSrc:  Oral    SpO2: 98% 99%    Weight:      Height:        Intake/Output Summary (Last 24 hours) at 04/25/2023 1201 Last data  filed at 04/25/2023 1020 Gross per 24 hour  Intake 180 ml  Output 1150 ml  Net -970 ml   Filed Weights   04/19/23 2337  Weight: 50.8 kg    Examination:  General exam: Appears comfortable, deconditioned, not in any acute distress. Respiratory system: CTA bilaterally.  Respiratory effort normal.  RR 14 Cardiovascular system: S1 & S2 heard, regular rate and rhythm, no murmur,  no pedal edema. Gastrointestinal system: Abdomen is non distended, soft and non tender. Normal bowel sounds heard. Central nervous system: Alert and oriented x 3. No focal neurological deficits. Extremities: No edema, No cyanosis, No clubbing  Skin: No rashes, lesions or ulcers Psychiatry: Judgement and insight appear normal. Mood & affect appropriate.     Data Reviewed: I have personally reviewed following labs and imaging studies  CBC: Recent Labs  Lab 04/19/23 1650 04/20/23 0355 04/21/23 0420 04/22/23 0443 04/24/23 0413  WBC 14.3* 15.8* 12.7* 13.2* 15.4*  NEUTROABS 13.3*  --   --   --   --   HGB 11.1* 11.5* 11.3* 11.6* 10.8*  HCT 34.5* 35.9* 35.0* 36.6 34.2*  MCV 87.1 88.9 88.4 87.1 90.2  PLT 190 195 208 236 243   Basic Metabolic Panel: Recent Labs  Lab 04/19/23 1650 04/20/23 0355 04/21/23 0420 04/22/23 0443 04/24/23 0413  NA 127* 132* 126* 125* 129*  K 3.7 3.2* 3.4* 3.7 3.8  CL 92* 94* 90* 91* 94*  CO2 24 28 25 23 24   GLUCOSE 150* 130* 120* 129* 118*  BUN 37* 26* 21 26* 28*  CREATININE 1.33* 1.24* 1.08* 1.01* 1.07*  CALCIUM 8.8* 8.8* 8.4* 8.6* 8.5*  MG  --   --  2.0 1.9 2.3  PHOS  --   --  2.6 2.3* 3.5   GFR: Estimated Creatinine Clearance: 25.1 mL/min (A) (by C-G formula based on SCr of 1.07 mg/dL (H)). Liver Function Tests: No results for input(s): "AST", "ALT", "ALKPHOS", "BILITOT", "PROT", "ALBUMIN" in the last 168 hours. No results for input(s): "LIPASE", "AMYLASE" in the last 168 hours. No results for input(s): "AMMONIA" in the last 168 hours. Coagulation Profile: No  results for input(s): "INR", "PROTIME" in the last 168 hours. Cardiac Enzymes: No results for input(s): "CKTOTAL", "CKMB", "CKMBINDEX", "TROPONINI" in the last 168 hours. BNP (last 3 results) No results for input(s): "PROBNP" in the last 8760 hours. HbA1C: No results for input(s): "HGBA1C" in the last 72 hours. CBG: Recent Labs  Lab 04/22/23 1050  GLUCAP 160*   Lipid Profile: No results for input(s): "CHOL", "HDL", "LDLCALC", "TRIG", "CHOLHDL", "LDLDIRECT" in the last 72 hours. Thyroid Function Tests: No results for input(s): "TSH", "T4TOTAL", "FREET4", "T3FREE", "THYROIDAB" in the last 72 hours. Anemia Panel: No results for input(s): "VITAMINB12", "FOLATE", "FERRITIN", "TIBC", "IRON", "RETICCTPCT" in the last 72 hours. Sepsis Labs: No results for input(s): "PROCALCITON", "LATICACIDVEN" in the last 168 hours.  Recent Results (from the past 240 hour(s))  SARS Coronavirus 2 by RT PCR (hospital order, performed in [facility] hospital lab) *cepheid single result test* Anterior Nasal Swab     Status: Abnormal   Collection Time: 04/17/23  6:14 PM   Specimen: Anterior Nasal Swab  Result Value Ref Range Status   SARS Coronavirus  2 by RT PCR POSITIVE (A) NEGATIVE Final    Comment: (NOTE) SARS-CoV-2 target nucleic acids are DETECTED  SARS-CoV-2 RNA is generally detectable in upper respiratory specimens  during the acute phase of infection.  Positive results are indicative  of the presence of the identified virus, but do not rule out bacterial infection or co-infection with other pathogens not detected by the test.  Clinical correlation with patient history and  other diagnostic information is necessary to determine patient infection status.  The expected result is negative.  Fact Sheet for Patients:   RoadLapTop.co.za   Fact Sheet for Healthcare Providers:   http://kim-miller.com/    This test is not yet approved or cleared by the [place] FDA and  has been authorized for detection and/or diagnosis of SARS-CoV-2 by FDA under an Emergency Use Authorization (EUA).  This EUA will remain in effect (meaning this test can be used) for the duration of  the COVID-19 declaration under Section 564(b)(1)  of the Act, 21 U.S.C. section 360-bbb-3(b)(1), unless the authorization is terminated or revoked sooner.   Performed at [facility], [address] [place], [place] [address]     Radiology Studies: No results found.  Scheduled Meds:  apixaban  2.5 mg Oral BID   diltiazem  120 mg Oral QHS   furosemide  40 mg Intravenous Daily   memantine  10 mg Oral BID   metoprolol succinate  25 mg Oral Daily   pantoprazole (PROTONIX) IV  40 mg Intravenous Q24H   Continuous Infusions:   LOS: 6 days    Time spent: 35 mins    Willeen Niece, MD [facility]   If 7PM-7AM, please contact night-coverage

## 2023-04-25 NOTE — Plan of Care (Signed)
  Problem: Education: Goal: Knowledge of General Education information will improve Description: Including pain rating scale, medication(s)/side effects and non-pharmacologic comfort measures Outcome: Completed/Met   Problem: Health Behavior/Discharge Planning: Goal: Ability to manage health-related needs will improve Outcome: Completed/Met   Problem: Clinical Measurements: Goal: Ability to maintain clinical measurements within normal limits will improve Outcome: Completed/Met Goal: Will remain free from infection Outcome: Completed/Met Goal: Diagnostic test results will improve Outcome: Completed/Met Goal: Respiratory complications will improve Outcome: Completed/Met Goal: Cardiovascular complication will be avoided Outcome: Completed/Met   Problem: Activity: Goal: Risk for activity intolerance will decrease Outcome: Completed/Met   Problem: Nutrition: Goal: Adequate nutrition will be maintained Outcome: Completed/Met   Problem: Coping: Goal: Level of anxiety will decrease Outcome: Completed/Met   Problem: Elimination: Goal: Will not experience complications related to bowel motility Outcome: Completed/Met Goal: Will not experience complications related to urinary retention Outcome: Completed/Met   Problem: Pain Managment: Goal: General experience of comfort will improve Outcome: Completed/Met   Problem: Safety: Goal: Ability to remain free from injury will improve Outcome: Completed/Met   Problem: Skin Integrity: Goal: Risk for impaired skin integrity will decrease Outcome: Completed/Met   Problem: Education: Goal: Knowledge of risk factors and measures for prevention of condition will improve Outcome: Completed/Met   Problem: Coping: Goal: Psychosocial and spiritual needs will be supported Outcome: Completed/Met   Problem: Respiratory: Goal: Will maintain a patent airway Outcome: Completed/Met Goal: Complications related to the disease process,  condition or treatment will be avoided or minimized Outcome: Completed/Met

## 2023-04-25 NOTE — TOC Transition Note (Signed)
Transition of Care [facility]) - CM/SW Discharge Note   Patient Details  Name: Gina Mcgee MRN: [id number] Date of Birth: [birth year]  [facility] CM/SW Contact:  Larrie Kass, LCSW Phone Number: 04/25/2023, 9:29 AM   Clinical Narrative:    CSW reached out to pt's RN due to pt not discharging yesterday. Per the family, they did not receive pt's concentrator at home. CSW spoke with [place] with [employer], he reported pt's portable tank was delivered yesterday and the family was told to call when someone is in the home to receive the concentrator.  Per [employer] no one called yesterday for concentrator delivery. He reports speaking with pt's daughter today, and the concentrator is out for delivery now. no further TOC needs TOC sign-off.    Final next level of care: Home w Home Health Services Barriers to Discharge: No Barriers Identified   Patient Goals and CMS Choice      Discharge Placement                      Patient and family notified of of transfer: 04/24/23  Discharge Plan and Services Additional resources added to the After Visit Summary for                    DME Agency: [employer]       HH Arranged: PT, OT, Social Work [employer] Agency: [facility]        Social Determinants of Health (SDOH) Interventions SDOH Screenings   Food Insecurity: No Food Insecurity (04/20/2023)  Housing: Low Risk  (04/20/2023)  Transportation Needs: No Transportation Needs (04/20/2023)  Utilities: Not At Risk (04/20/2023)  Tobacco Use: Low Risk  (04/19/2023)  Recent Concern: Tobacco Use - Medium Risk (04/07/2023)   Received from [facility]     Readmission Risk Interventions    01/21/2023   11:14 AM 01/02/2023   11:46 AM  Readmission Risk Prevention Plan  Transportation Screening Complete Complete  PCP or Specialist Appt within 3-5 Days Complete Complete  HRI or Home Care Consult Complete Complete  Social Work Consult for Recovery Care  Planning/Counseling Complete   Palliative Care Screening Not Applicable Not Applicable  Medication Review [employer]) Complete Complete

## 2023-04-27 ENCOUNTER — Encounter (HOSPITAL_COMMUNITY): Payer: Self-pay

## 2023-04-27 ENCOUNTER — Emergency Department (HOSPITAL_COMMUNITY): Payer: Medicare Other

## 2023-04-27 ENCOUNTER — Inpatient Hospital Stay (HOSPITAL_COMMUNITY)
Admission: EM | Admit: 2023-04-27 | Discharge: 2023-05-01 | DRG: 871 | Disposition: A | Discharge status: Home / Self Care | Payer: Medicare Other | Attending: Internal Medicine | Admitting: Internal Medicine

## 2023-04-27 ENCOUNTER — Other Ambulatory Visit: Payer: Self-pay

## 2023-04-27 DIAGNOSIS — K219 Gastro-esophageal reflux disease without esophagitis: Secondary | ICD-10-CM | POA: Diagnosis not present

## 2023-04-27 DIAGNOSIS — R410 Disorientation, unspecified: Secondary | ICD-10-CM | POA: Diagnosis not present

## 2023-04-27 DIAGNOSIS — Z8616 Personal history of COVID-19: Secondary | ICD-10-CM | POA: Diagnosis not present

## 2023-04-27 DIAGNOSIS — Z66 Do not resuscitate: Secondary | ICD-10-CM | POA: Diagnosis present

## 2023-04-27 DIAGNOSIS — M5134 Other intervertebral disc degeneration, thoracic region: Secondary | ICD-10-CM | POA: Diagnosis not present

## 2023-04-27 DIAGNOSIS — R079 Chest pain, unspecified: Secondary | ICD-10-CM | POA: Diagnosis not present

## 2023-04-27 DIAGNOSIS — Z79899 Other long term (current) drug therapy: Secondary | ICD-10-CM

## 2023-04-27 DIAGNOSIS — J189 Pneumonia, unspecified organism: Secondary | ICD-10-CM | POA: Diagnosis not present

## 2023-04-27 DIAGNOSIS — J90 Pleural effusion, not elsewhere classified: Secondary | ICD-10-CM

## 2023-04-27 DIAGNOSIS — M4856XA Collapsed vertebra, not elsewhere classified, lumbar region, initial encounter for fracture: Secondary | ICD-10-CM | POA: Diagnosis not present

## 2023-04-27 DIAGNOSIS — D649 Anemia, unspecified: Secondary | ICD-10-CM | POA: Diagnosis not present

## 2023-04-27 DIAGNOSIS — A419 Sepsis, unspecified organism: Secondary | ICD-10-CM | POA: Diagnosis not present

## 2023-04-27 DIAGNOSIS — M47814 Spondylosis without myelopathy or radiculopathy, thoracic region: Secondary | ICD-10-CM | POA: Diagnosis not present

## 2023-04-27 DIAGNOSIS — G3184 Mild cognitive impairment, so stated: Secondary | ICD-10-CM | POA: Diagnosis present

## 2023-04-27 DIAGNOSIS — Z8551 Personal history of malignant neoplasm of bladder: Secondary | ICD-10-CM

## 2023-04-27 DIAGNOSIS — I110 Hypertensive heart disease with heart failure: Secondary | ICD-10-CM | POA: Diagnosis present

## 2023-04-27 DIAGNOSIS — I10 Essential (primary) hypertension: Secondary | ICD-10-CM | POA: Diagnosis present

## 2023-04-27 DIAGNOSIS — R0689 Other abnormalities of breathing: Secondary | ICD-10-CM | POA: Diagnosis not present

## 2023-04-27 DIAGNOSIS — R0902 Hypoxemia: Principal | ICD-10-CM

## 2023-04-27 DIAGNOSIS — R627 Adult failure to thrive: Secondary | ICD-10-CM | POA: Diagnosis present

## 2023-04-27 DIAGNOSIS — Z853 Personal history of malignant neoplasm of breast: Secondary | ICD-10-CM

## 2023-04-27 DIAGNOSIS — G8929 Other chronic pain: Secondary | ICD-10-CM | POA: Diagnosis present

## 2023-04-27 DIAGNOSIS — Z886 Allergy status to analgesic agent status: Secondary | ICD-10-CM

## 2023-04-27 DIAGNOSIS — Z7901 Long term (current) use of anticoagulants: Secondary | ICD-10-CM | POA: Diagnosis not present

## 2023-04-27 DIAGNOSIS — R6520 Severe sepsis without septic shock: Secondary | ICD-10-CM

## 2023-04-27 DIAGNOSIS — R54 Age-related physical debility: Secondary | ICD-10-CM | POA: Diagnosis present

## 2023-04-27 DIAGNOSIS — I480 Paroxysmal atrial fibrillation: Secondary | ICD-10-CM | POA: Diagnosis present

## 2023-04-27 DIAGNOSIS — Z801 Family history of malignant neoplasm of trachea, bronchus and lung: Secondary | ICD-10-CM

## 2023-04-27 DIAGNOSIS — M419 Scoliosis, unspecified: Secondary | ICD-10-CM | POA: Diagnosis not present

## 2023-04-27 DIAGNOSIS — Z7401 Bed confinement status: Secondary | ICD-10-CM | POA: Diagnosis not present

## 2023-04-27 DIAGNOSIS — M48061 Spinal stenosis, lumbar region without neurogenic claudication: Secondary | ICD-10-CM | POA: Diagnosis not present

## 2023-04-27 DIAGNOSIS — Z91013 Allergy to seafood: Secondary | ICD-10-CM

## 2023-04-27 DIAGNOSIS — M4854XA Collapsed vertebra, not elsewhere classified, thoracic region, initial encounter for fracture: Secondary | ICD-10-CM | POA: Diagnosis not present

## 2023-04-27 DIAGNOSIS — C679 Malignant neoplasm of bladder, unspecified: Secondary | ICD-10-CM | POA: Diagnosis present

## 2023-04-27 DIAGNOSIS — Z91040 Latex allergy status: Secondary | ICD-10-CM

## 2023-04-27 DIAGNOSIS — Z823 Family history of stroke: Secondary | ICD-10-CM

## 2023-04-27 DIAGNOSIS — K5900 Constipation, unspecified: Secondary | ICD-10-CM | POA: Diagnosis present

## 2023-04-27 DIAGNOSIS — R918 Other nonspecific abnormal finding of lung field: Secondary | ICD-10-CM | POA: Diagnosis not present

## 2023-04-27 DIAGNOSIS — I959 Hypotension, unspecified: Secondary | ICD-10-CM | POA: Diagnosis not present

## 2023-04-27 DIAGNOSIS — R0602 Shortness of breath: Secondary | ICD-10-CM | POA: Diagnosis not present

## 2023-04-27 DIAGNOSIS — J984 Other disorders of lung: Secondary | ICD-10-CM | POA: Diagnosis not present

## 2023-04-27 DIAGNOSIS — Z882 Allergy status to sulfonamides status: Secondary | ICD-10-CM

## 2023-04-27 DIAGNOSIS — I5022 Chronic systolic (congestive) heart failure: Secondary | ICD-10-CM | POA: Diagnosis present

## 2023-04-27 DIAGNOSIS — R109 Unspecified abdominal pain: Secondary | ICD-10-CM | POA: Diagnosis not present

## 2023-04-27 DIAGNOSIS — I4891 Unspecified atrial fibrillation: Secondary | ICD-10-CM | POA: Diagnosis present

## 2023-04-27 DIAGNOSIS — J9601 Acute respiratory failure with hypoxia: Secondary | ICD-10-CM | POA: Diagnosis present

## 2023-04-27 DIAGNOSIS — M5136 Other intervertebral disc degeneration, lumbar region: Secondary | ICD-10-CM | POA: Diagnosis not present

## 2023-04-27 LAB — CBC WITH DIFFERENTIAL/PLATELET
Abs Immature Granulocytes: 0.4 10*3/uL — ABNORMAL HIGH (ref 0.00–0.07)
Basophils Absolute: 0.1 10*3/uL (ref 0.0–0.1)
Basophils Relative: 0 %
Eosinophils Absolute: 0.1 10*3/uL (ref 0.0–0.5)
Eosinophils Relative: 0 %
HCT: 36.3 % (ref 36.0–46.0)
Hemoglobin: 11.5 g/dL — ABNORMAL LOW (ref 12.0–15.0)
Immature Granulocytes: 2 %
Lymphocytes Relative: 3 %
Lymphs Abs: 0.5 10*3/uL — ABNORMAL LOW (ref 0.7–4.0)
MCH: 28.5 pg (ref 26.0–34.0)
MCHC: 31.7 g/dL (ref 30.0–36.0)
MCV: 89.9 fL (ref 80.0–100.0)
Monocytes Absolute: 0.5 10*3/uL (ref 0.1–1.0)
Monocytes Relative: 2 %
Neutro Abs: 18.7 10*3/uL — ABNORMAL HIGH (ref 1.7–7.7)
Neutrophils Relative %: 93 %
Platelets: 387 10*3/uL (ref 150–400)
RBC: 4.04 MIL/uL (ref 3.87–5.11)
RDW: 15.7 % — ABNORMAL HIGH (ref 11.5–15.5)
WBC: 20.2 10*3/uL — ABNORMAL HIGH (ref 4.0–10.5)
nRBC: 0 % (ref 0.0–0.2)

## 2023-04-27 LAB — TROPONIN I (HIGH SENSITIVITY)
Troponin I (High Sensitivity): 21 ng/L — ABNORMAL HIGH (ref <18)
Troponin I (High Sensitivity): 32 ng/L — ABNORMAL HIGH (ref <18)

## 2023-04-27 LAB — COMPREHENSIVE METABOLIC PANEL
ALT: 41 U/L (ref 0–44)
AST: 45 U/L — ABNORMAL HIGH (ref 15–41)
Albumin: 2.5 g/dL — ABNORMAL LOW (ref 3.5–5.0)
Alkaline Phosphatase: 135 U/L — ABNORMAL HIGH (ref 38–126)
Anion gap: 17 — ABNORMAL HIGH (ref 5–15)
BUN: 20 mg/dL (ref 8–23)
CO2: 24 mmol/L (ref 22–32)
Calcium: 8.5 mg/dL — ABNORMAL LOW (ref 8.9–10.3)
Chloride: 90 mmol/L — ABNORMAL LOW (ref 98–111)
Creatinine, Ser: 1.05 mg/dL — ABNORMAL HIGH (ref 0.44–1.00)
GFR, Estimated: 50 mL/min — ABNORMAL LOW (ref >60)
Glucose, Bld: 106 mg/dL — ABNORMAL HIGH (ref 70–99)
Potassium: 3.9 mmol/L (ref 3.5–5.1)
Sodium: 131 mmol/L — ABNORMAL LOW (ref 135–145)
Total Bilirubin: 0.7 mg/dL (ref 0.3–1.2)
Total Protein: 6.7 g/dL (ref 6.5–8.1)

## 2023-04-27 LAB — I-STAT VENOUS BLOOD GAS, ED
Acid-Base Excess: 10 mmol/L — ABNORMAL HIGH (ref 0.0–2.0)
Bicarbonate: 33 mmol/L — ABNORMAL HIGH (ref 20.0–28.0)
Calcium, Ion: 1.01 mmol/L — ABNORMAL LOW (ref 1.15–1.40)
HCT: 34 % — ABNORMAL LOW (ref 36.0–46.0)
Hemoglobin: 11.6 g/dL — ABNORMAL LOW (ref 12.0–15.0)
O2 Saturation: 92 %
Potassium: 4.6 mmol/L (ref 3.5–5.1)
Sodium: 130 mmol/L — ABNORMAL LOW (ref 135–145)
TCO2: 34 mmol/L — ABNORMAL HIGH (ref 22–32)
pCO2, Ven: 36.2 mmHg — ABNORMAL LOW (ref 44–60)
pH, Ven: 7.567 — ABNORMAL HIGH (ref 7.25–7.43)
pO2, Ven: 53 mmHg — ABNORMAL HIGH (ref 32–45)

## 2023-04-27 LAB — I-STAT CG4 LACTIC ACID, ED
Lactic Acid, Venous: 1.5 mmol/L (ref 0.5–1.9)
Lactic Acid, Venous: 2 mmol/L (ref 0.5–1.9)

## 2023-04-27 MED ORDER — SODIUM CHLORIDE 0.9 % IV SOLN
2.0000 g | Freq: Once | INTRAVENOUS | Status: AC
  Administered 2023-04-27: 2 g via INTRAVENOUS
  Filled 2023-04-27: qty 12.5

## 2023-04-27 MED ORDER — SODIUM CHLORIDE 0.9% FLUSH
3.0000 mL | Freq: Two times a day (BID) | INTRAVENOUS | Status: DC
  Administered 2023-04-27 – 2023-05-01 (×7): 3 mL via INTRAVENOUS

## 2023-04-27 MED ORDER — LACTATED RINGERS IV SOLN: 150.0000 mL/h | INTRAVENOUS | Status: DC

## 2023-04-27 MED ORDER — ACETAMINOPHEN 325 MG PO TABS
650.0000 mg | ORAL_TABLET | Freq: Four times a day (QID) | ORAL | Status: DC | PRN
  Administered 2023-04-28: 650 mg via ORAL
  Filled 2023-04-27: qty 2

## 2023-04-27 MED ORDER — VANCOMYCIN HCL IN DEXTROSE 1-5 GM/200ML-% IV SOLN: 1000.0000 mg | INTRAVENOUS | Status: DC

## 2023-04-27 MED ORDER — ACETAMINOPHEN 650 MG RE SUPP: 650.0000 mg | Freq: Four times a day (QID) | RECTAL | Status: DC | PRN

## 2023-04-27 MED ORDER — VANCOMYCIN HCL IN DEXTROSE 1-5 GM/200ML-% IV SOLN
1000.0000 mg | Freq: Once | INTRAVENOUS | Status: AC
  Administered 2023-04-27: 1000 mg via INTRAVENOUS
  Filled 2023-04-27: qty 200

## 2023-04-27 MED ORDER — LACTATED RINGERS IV BOLUS (SEPSIS)
1000.0000 mL | Freq: Once | INTRAVENOUS | Status: AC
  Administered 2023-04-27: 1000 mL via INTRAVENOUS

## 2023-04-27 MED ORDER — MEMANTINE HCL 10 MG PO TABS
10.0000 mg | ORAL_TABLET | Freq: Two times a day (BID) | ORAL | Status: DC
  Administered 2023-04-27 – 2023-05-01 (×8): 10 mg via ORAL
  Filled 2023-04-27 (×8): qty 1

## 2023-04-27 MED ORDER — LACTATED RINGERS IV BOLUS (SEPSIS)
500.0000 mL | Freq: Once | INTRAVENOUS | Status: AC
  Administered 2023-04-27: 500 mL via INTRAVENOUS

## 2023-04-27 MED ORDER — APIXABAN 2.5 MG PO TABS
2.5000 mg | ORAL_TABLET | Freq: Two times a day (BID) | ORAL | Status: DC
  Administered 2023-04-27 – 2023-05-01 (×8): 2.5 mg via ORAL
  Filled 2023-04-27 (×8): qty 1

## 2023-04-27 MED ORDER — SODIUM CHLORIDE 0.9 % IV SOLN: 2.0000 g | INTRAVENOUS | Status: DC

## 2023-04-27 MED ORDER — POLYETHYLENE GLYCOL 3350 17 G PO PACK
17.0000 g | PACK | Freq: Every day | ORAL | Status: DC | PRN
  Administered 2023-04-29: 17 g via ORAL
  Filled 2023-04-27: qty 1

## 2023-04-27 MED ORDER — LACTATED RINGERS IV SOLN: INTRAVENOUS | Status: DC

## 2023-04-27 MED ORDER — METOPROLOL SUCCINATE ER 25 MG PO TB24
25.0000 mg | ORAL_TABLET | Freq: Every day | ORAL | Status: DC
  Administered 2023-04-28 – 2023-05-01 (×4): 25 mg via ORAL
  Filled 2023-04-27 (×4): qty 1

## 2023-04-27 NOTE — Progress Notes (Signed)
Post d/c  9:30 am  CSW received a call from pt's daughter, expressing her frustration with pt's DME after discharge. Pt daughter reports that pt did not receive a hospital bed and was not told how to use pt's home O2 properly. Pt's daughter is also requesting a Nurse, adult. Pt's daughter reports that she has tried to contact DME company this morning and has not received an an answer.   CSW spoke with Vaughan Basta from [employer], to inform him about pt's family concerns. He stated he would speak with pt's daughter. Jermaine also report's pt's hospital bed will be delivered to pt's home today.   11:20am CSW received a call back from [place] with [employer] he is requesting an order form to be signed to deliver [employer]. MD was able to signed pt's order form and it was faxed back to [employer]. Per [employer] lift will be delivered to pt's home today.

## 2023-04-27 NOTE — Progress Notes (Signed)
ED Pharmacy Antibiotic Sign Off An antibiotic consult was received from an ED provider for cefepime and vancomycin per pharmacy dosing for pneumonia. A chart review was completed to assess appropriateness.  A single dose of cefepime and vancomycin (Dose appropriate) placed by the ED provider.   The following one time order(s) were placed per pharmacy consult: MRSA PCR  Further antibiotic and/or antibiotic pharmacy consults should be ordered by the admitting provider if indicated.   Thank you for allowing pharmacy to be a part of this patient's care.   Delmar Landau, PharmD, BCPS 04/27/2023 5:03 PM ED Clinical Pharmacist -  [phone]

## 2023-04-27 NOTE — Progress Notes (Signed)
Pharmacy Antibiotic Note  Gina Mcgee is a 88 y.o. female admitted on 04/27/2023 with pneumonia.  Pharmacy has been consulted for vancomycin and cefepime dosing.  WBC 20.2, afebrile, lactate 1.5.    Plan: Start vancomycin 1000 mg every 48 hours (eAUC 533)  Start cefepime 2 g every 24 hours  Monitor cultures, WBC, temperature, and clinical picture De-escalate as able   Height: 5' (152.4 cm) Weight: 50.8 kg (111 lb 15.9 oz) IBW/kg (Calculated) : 45.5  Temp (24hrs), Avg:98.2 F (36.8 C), Min:98.1 F (36.7 C), Max:98.2 F (36.8 C)  Recent Labs  Lab 04/21/23 0420 04/22/23 0443 04/24/23 0413 04/27/23 1405 04/27/23 1803  WBC 12.7* 13.2* 15.4* 20.2*  --   CREATININE 1.08* 1.01* 1.07* 1.05*  --   LATICACIDVEN  --   --   --   --  1.5    Estimated Creatinine Clearance: 25.6 mL/min (A) (by C-G formula based on SCr of 1.05 mg/dL (H)).    Allergies  Allergen Reactions   Aspirin Other (See Comments)    Gets jitter with large doses   Cipro [Ciprofloxacin Hcl] Diarrhea   Doxycycline Diarrhea   Fish Allergy Nausea Only and Other (See Comments)    "makes me jumpy and twitchy, but grand children are allergic"    Nsaids     Can take for short periods of time. Naprosyn -feels dazed   Shellfish-Derived Products Other (See Comments)    "makes me jumpy and twitchy, but grand children are allergic"   Aloe Rash   Latex Rash   Sulfa Antibiotics Rash    Antimicrobials this admission: 8/5 vancomycin >>  8/5 cefepime >>   Dose adjustments this admission:  Microbiology results:   Thank you for allowing pharmacy to be a part of this patient's care.  Griffin Dakin 04/27/2023 6:21 PM

## 2023-04-27 NOTE — ED Triage Notes (Signed)
Pt BIB EMS from home, failure to thrive, was discharged from [facility], recent Covid, on 4L [place], and since her discharge she hasn't been at baseline.   130/63 HR 102 Resp 40  96% 4L

## 2023-04-27 NOTE — ED Provider Notes (Signed)
[place] EMERGENCY DEPARTMENT AT [facility] Provider Note  CSN: [id number] Arrival date & time: 04/27/23 1350  Chief Complaint(s) Failure To Thrive  HPI Gina Mcgee is a 89 y.o. female with PMH paroxysmal A-fib, HTN, breast cancer, recent hospital admission on 04/19/2023 for COVID hypoxia who presents emergency department for evaluation of shortness of breath and hypoxia.  Patient's daughter is an Charity fundraiser who found the patient to be working harder to breathe with perioral cyanosis today and home pulse ox was low in the 80s.  She was discharged from the hospital on 3 to 4 L nasal cannula and is requiring a similar amount of oxygen to maintain oxygen saturations here in the emergency room.  Denies chest pain, abdominal pain, nausea, vomiting or other systemic symptoms.  Patient has not returned to her functional baseline since hospital discharge and in the setting of her noted hypoxia today family brought the patient back to the emergency department for further evaluation.  Does appear that there were discussions around the thoracentesis while admitted but ultimately this was deferred.   Past Medical History Past Medical History:  Diagnosis Date   A-fib [facility])    Acute encephalopathy 12/30/2022   Brain concussion 09/22/1958   Cancer (HCC)    left-sided breast cancer   CAP (community acquired pneumonia) 08/17/2022   Concussion 09/22/1958   with left-sided neuro defecits   E coli bacteremia 12/31/2022   E. coli UTI 01/02/2023   Fall 11/20/2011   HTN (hypertension)    Hypoxic respiratory failure (HCC) 08/18/2022   Lower urinary tract infectious disease 12/30/2022   Osteoporosis    Pelvic fracture (HCC) 11/20/2011   Preop cardiovascular exam 01/20/2013   RSV (respiratory syncytial virus pneumonia) 08/18/2022   UTI (urinary tract infection) 01/20/2023   Patient Active Problem List   Diagnosis Date Noted   Sepsis (HCC) 04/27/2023   Acute hypoxemic respiratory failure (HCC)  04/19/2023   History of bacteremia 12/31/2022   Acute metabolic encephalopathy 12/31/2022   Anemia 12/31/2022   Hypotension 12/31/2022   Weakness 12/30/2022   Bladder cancer (HCC) 08/18/2022   MCI (mild cognitive impairment) 08/18/2022   Atrial fibrillation (HCC) 04/16/2022   Gastroesophageal reflux disease without esophagitis 11/26/2020   Pernicious anemia 11/26/2020   Laryngopharyngeal reflux 11/26/2020   Osteoporosis    Abnormal CXR 01/20/2013   Fracture of pubic ramus (HCC) 11/21/2011   Hip pain, acute 11/20/2011   HTN (hypertension)    Concussion    History of breast cancer in female    Home Medication(s) Prior to Admission medications   Medication Sig Start Date End Date Taking? Authorizing Provider  acetaminophen (TYLENOL) 500 MG tablet Take 500 mg by mouth every 6 (six) hours as needed for moderate pain.   Yes [provider]  apixaban (ELIQUIS) 2.5 MG TABS tablet Take 1 tablet (2.5 mg total) by mouth 2 (two) times daily. 12/10/22  Yes [name], Manish J, MD  Cholecalciferol (VITAMIN D3) 10 MCG (400 UNIT) CAPS Take 400 Units by mouth 2 (two) times daily.   Yes [provider]  cyanocobalamin (VITAMIN B12) 1000 MCG/ML injection Inject 1,000 mcg into the muscle every 30 (thirty) days.   Yes [provider]  diltiazem (CARDIZEM CD) 120 MG 24 hr capsule Take 1 capsule (120 mg total) by mouth daily. Patient taking differently: Take 120 mg by mouth every evening. 01/30/23  Yes [name], Manish J, MD  ferrous sulfate 325 (65 FE) MG tablet Take 1 tablet (325 mg total) by mouth  every 3 (three) days. 01/02/23  Yes Rodolph Bong, MD  furosemide (LASIX) 40 MG tablet Take 1 tablet (40 mg total) by mouth daily. 04/24/23 04/23/24 Yes Willeen Niece, MD  guaiFENesin-dextromethorphan (ROBITUSSIN DM) 100-10 MG/5ML syrup Take 5 mLs by mouth every 4 (four) hours as needed for cough (chest congestion). 04/24/23  Yes Willeen Niece, MD  ketoconazole (NIZORAL) 2 % cream  Apply 1 Application topically daily as needed for irritation. Apply to toes   Yes [provider]  memantine (NAMENDA) 10 MG tablet Take 10 mg by mouth 2 (two) times daily.   Yes [provider]  metoprolol succinate (TOPROL-XL) 25 MG 24 hr tablet Take 1 tablet (25 mg total) by mouth daily. 01/30/23  Yes [name], [name], MD  Multiple Vitamins-Minerals (MULTIVITAMIN WITH MINERALS) tablet Take 1 tablet by mouth every evening.   Yes [provider]  Saccharomyces boulardii (PROBIOTIC) 250 MG CAPS Take 250 mg by mouth daily.   Yes [provider]  senna-docusate (SENOKOT-S) 8.6-50 MG tablet Take 1 tablet by mouth daily.   Yes [provider]                                                                                                                                    Past Surgical History Past Surgical History:  Procedure Laterality Date   BOWEL RESECTION  approx. in 2007   polyp and formed stricture   BREAST LUMPECTOMY  1981   left lumpectomy w/ radiation   COLONOSCOPY W/ POLYPECTOMY     FEMUR SURGERY  2011   left femur pinning   JOINT REPLACEMENT     ORIF HUMERUS FRACTURE Left 01/25/2013   Procedure: OPEN REDUCTION INTERNAL FIXATION (ORIF) DISTAL HUMERUS FRACTURE REPAIR RECONSTRUCTION AND ULNA NERVE DECOMPRESSION AND ANTERIOR TRANSPOSITION AS NECESSARY;  Surgeon: Dominica Severin, MD;  Location: [facility] OR;  Service: Orthopedics;  Laterality: Left;   Family History Family History  Problem Relation Age of Onset   Stroke Mother    Lung cancer Father    Stroke Father     Social History Social History   Tobacco Use   Smoking status: Never   Smokeless tobacco: Never  Vaping Use   Vaping status: Never Used  Substance Use Topics   Alcohol use: Not Currently   Drug use: No   Allergies Aspirin, Cipro [ciprofloxacin hcl], Doxycycline, Fish allergy, Nsaids, Shellfish-derived products, Aloe, Latex, and Sulfa antibiotics  Review of  Systems Review of Systems  Constitutional:  Positive for fatigue.  Respiratory:  Positive for cough and shortness of breath.     Physical Exam Vital Signs  I have reviewed the triage vital signs BP (!) 100/57   Pulse 89   Temp 98.1 F (36.7 C) (Oral)   Resp (!) 26   Ht 5' (1.524 m)   Wt 50.8 kg   SpO2 98%   BMI 21.87 kg/m   Physical Exam Vitals and  nursing note reviewed.  Constitutional:      General: She is not in acute distress.    Appearance: She is well-developed.  HENT:     Head: Normocephalic and atraumatic.  Eyes:     Conjunctiva/sclera: Conjunctivae normal.  Cardiovascular:     Rate and Rhythm: Normal rate and regular rhythm.     Heart sounds: No murmur heard. Pulmonary:     Effort: Pulmonary effort is normal. No respiratory distress.     Breath sounds: Rales present.  Abdominal:     Palpations: Abdomen is soft.     Tenderness: There is no abdominal tenderness.  Musculoskeletal:        General: No swelling.     Cervical back: Neck supple.  Skin:    General: Skin is warm and dry.     Capillary Refill: Capillary refill takes less than 2 seconds.  Neurological:     Mental Status: She is alert.  Psychiatric:        Mood and Affect: Mood normal.     ED Results and Treatments Labs (all labs ordered are listed, but only abnormal results are displayed) Labs Reviewed  COMPREHENSIVE METABOLIC PANEL - Abnormal; Notable for the following components:      Result Value   Sodium 131 (*)    Chloride 90 (*)    Glucose, Bld 106 (*)    Creatinine, Ser 1.05 (*)    Calcium 8.5 (*)    Albumin 2.5 (*)    AST 45 (*)    Alkaline Phosphatase 135 (*)    GFR, Estimated 50 (*)    Anion gap 17 (*)    All other components within normal limits  CBC WITH DIFFERENTIAL/PLATELET - Abnormal; Notable for the following components:   WBC 20.2 (*)    Hemoglobin 11.5 (*)    RDW 15.7 (*)    Neutro Abs 18.7 (*)    Lymphs Abs 0.5 (*)    Abs Immature Granulocytes 0.40 (*)    All  other components within normal limits  I-STAT VENOUS BLOOD GAS, ED - Abnormal; Notable for the following components:   pH, Ven 7.567 (*)    pCO2, Ven 36.2 (*)    pO2, Ven 53 (*)    Bicarbonate 33.0 (*)    TCO2 34 (*)    Acid-Base Excess 10.0 (*)    Sodium 130 (*)    Calcium, Ion 1.01 (*)    HCT 34.0 (*)    Hemoglobin 11.6 (*)    All other components within normal limits  TROPONIN I (HIGH SENSITIVITY) - Abnormal; Notable for the following components:   Troponin I (High Sensitivity) 21 (*)    All other components within normal limits  TROPONIN I (HIGH SENSITIVITY) - Abnormal; Notable for the following components:   Troponin I (High Sensitivity) 32 (*)    All other components within normal limits  CULTURE, BLOOD (ROUTINE X 2)  CULTURE, BLOOD (ROUTINE X 2)  MRSA NEXT GEN BY PCR, NASAL  BLOOD GAS, VENOUS  URINALYSIS, ROUTINE W REFLEX MICROSCOPIC  PROTIME-INR  CORTISOL-AM, BLOOD  PROCALCITONIN  COMPREHENSIVE METABOLIC PANEL  CBC  I-STAT CG4 LACTIC ACID, ED  I-STAT CG4 LACTIC ACID, ED  Radiology DG Chest 1 View  Result Date: 04/27/2023 CLINICAL DATA:  Pain. EXAM: CHEST  1 VIEW COMPARISON:  Radiograph 04/23/2023, CT 04/19/2023 FINDINGS: Significant patient rotation. Heart is grossly normal in size. Left lung base opacity likely in part related to retrocardiac hiatal hernia. Small left pleural effusion is similar. The right lung is clear. No pneumothorax. The bones are diffusely under mineralized. IMPRESSION: Left lung base opacity likely in part related to retrocardiac hiatal hernia. Small left pleural effusion is similar. Electronically Signed   By: Narda Rutherford M.D.   On: 04/27/2023 15:09    Pertinent labs & imaging results that were available during my care of the patient were reviewed by me and considered in my medical decision making (see MDM for  details).  Medications Ordered in ED Medications  lactated ringers infusion (has no administration in time range)  vancomycin (VANCOCIN) IVPB 1000 mg/200 mL premix (has no administration in time range)  metoprolol succinate (TOPROL-XL) 24 hr tablet 25 mg (has no administration in time range)  memantine (NAMENDA) tablet 10 mg (has no administration in time range)  apixaban (ELIQUIS) tablet 2.5 mg (has no administration in time range)  sodium chloride flush (NS) 0.9 % injection 3 mL (has no administration in time range)  lactated ringers bolus 500 mL (has no administration in time range)  acetaminophen (TYLENOL) tablet 650 mg (has no administration in time range)    Or  acetaminophen (TYLENOL) suppository 650 mg (has no administration in time range)  polyethylene glycol (MIRALAX / GLYCOLAX) packet 17 g (has no administration in time range)  ceFEPIme (MAXIPIME) 2 g in sodium chloride 0.9 % 100 mL IVPB (has no administration in time range)  vancomycin (VANCOCIN) IVPB 1000 mg/200 mL premix (has no administration in time range)  lactated ringers bolus 1,000 mL (1,000 mLs Intravenous New Bag/Given 04/27/23 1804)  ceFEPIme (MAXIPIME) 2 g in sodium chloride 0.9 % 100 mL IVPB (2 g Intravenous New Bag/Given 04/27/23 1802)                                                                                                                                     Procedures Procedures  (including critical care time)  Medical Decision Making / ED Course   This patient presents to the ED for concern of shortness of breath, this involves an extensive number of treatment options, and is a complaint that carries with it a high risk of complications and morbidity.  The differential diagnosis includes Pe, PTX, Pulmonary Edema, ARDS, COPD/Asthma, ACS, CHF exacerbation, Arrhythmia, Pericardial Effusion/Tamponade, Anemia, Sepsis, Acidosis/Hypercapnia, Anxiety, Viral URI  MDM: Patient seen emergency room for evaluation of  shortness of breath.  Physical exam with rales at the bases but is otherwise unremarkable.  Chest x-ray with a left lung base opacity and persistent pleural effusion.  Patient pending laboratory evaluation at time of signout.  Please see provider signout for continuation of workup.  Anticipate readmission.   Additional history obtained: -Additional history obtained from multiple family members -External records from outside source obtained and reviewed including: Chart review including previous notes, labs, imaging, consultation notes   Lab Tests: -I ordered, reviewed, and interpreted labs.   The pertinent results include:   Labs Reviewed  COMPREHENSIVE METABOLIC PANEL - Abnormal; Notable for the following components:      Result Value   Sodium 131 (*)    Chloride 90 (*)    Glucose, Bld 106 (*)    Creatinine, Ser 1.05 (*)    Calcium 8.5 (*)    Albumin 2.5 (*)    AST 45 (*)    Alkaline Phosphatase 135 (*)    GFR, Estimated 50 (*)    Anion gap 17 (*)    All other components within normal limits  CBC WITH DIFFERENTIAL/PLATELET - Abnormal; Notable for the following components:   WBC 20.2 (*)    Hemoglobin 11.5 (*)    RDW 15.7 (*)    Neutro Abs 18.7 (*)    Lymphs Abs 0.5 (*)    Abs Immature Granulocytes 0.40 (*)    All other components within normal limits  I-STAT VENOUS BLOOD GAS, ED - Abnormal; Notable for the following components:   pH, Ven 7.567 (*)    pCO2, Ven 36.2 (*)    pO2, Ven 53 (*)    Bicarbonate 33.0 (*)    TCO2 34 (*)    Acid-Base Excess 10.0 (*)    Sodium 130 (*)    Calcium, Ion 1.01 (*)    HCT 34.0 (*)    Hemoglobin 11.6 (*)    All other components within normal limits  TROPONIN I (HIGH SENSITIVITY) - Abnormal; Notable for the following components:   Troponin I (High Sensitivity) 21 (*)    All other components within normal limits  TROPONIN I (HIGH SENSITIVITY) - Abnormal; Notable for the following components:   Troponin I (High Sensitivity) 32 (*)    All  other components within normal limits  CULTURE, BLOOD (ROUTINE X 2)  CULTURE, BLOOD (ROUTINE X 2)  MRSA NEXT GEN BY PCR, NASAL  BLOOD GAS, VENOUS  URINALYSIS, ROUTINE W REFLEX MICROSCOPIC  PROTIME-INR  CORTISOL-AM, BLOOD  PROCALCITONIN  COMPREHENSIVE METABOLIC PANEL  CBC  I-STAT CG4 LACTIC ACID, ED  I-STAT CG4 LACTIC ACID, ED      EKG   EKG Interpretation Date/Time:  Monday April 27 2023 15:35:57 EDT Ventricular Rate:  98 PR Interval:  165 QRS Duration:  81 QT Interval:  349 QTC Calculation: 446 R Axis:   -29  Text Interpretation: Sinus rhythm Atrial premature complexes Borderline left axis deviation Low voltage, precordial leads Abnormal R-wave progression, late transition Borderline T abnormalities, anterior leads No significant change since last tracing Confirmed by Elayne Snare ([id number]) on 04/27/2023 3:38:34 PM         Imaging Studies ordered: I ordered imaging studies including chest x-ray I independently visualized and interpreted imaging. I agree with the radiologist interpretation   Medicines ordered and prescription drug management: Meds ordered this encounter  Medications   lactated ringers infusion   lactated ringers bolus 1,000 mL    Order Specific Question:   Reason 30 mL/kg dose is not being ordered    Answer:   Non-Septic Shock Clinical Presentation   vancomycin (VANCOCIN) IVPB 1000 mg/200 mL premix    Order Specific Question:   Indication:    Answer:   Pneumonia   ceFEPIme (MAXIPIME) 2 g in sodium chloride  0.9 % 100 mL IVPB    Order Specific Question:   Antibiotic Indication:    Answer:   HCAP   metoprolol succinate (TOPROL-XL) 24 hr tablet 25 mg   memantine (NAMENDA) tablet 10 mg   apixaban (ELIQUIS) tablet 2.5 mg   DISCONTD: lactated ringers infusion   sodium chloride flush (NS) 0.9 % injection 3 mL   lactated ringers bolus 500 mL    Order Specific Question:   Reason 30 mL/kg dose is not being ordered    Answer:   Fluid given earlier or  by EMS - completing remainder of 30 mL/kg by TBW   OR Linked Order Group    acetaminophen (TYLENOL) tablet 650 mg    acetaminophen (TYLENOL) suppository 650 mg   polyethylene glycol (MIRALAX / GLYCOLAX) packet 17 g   DISCONTD: vancomycin (VANCOCIN) IVPB 1000 mg/200 mL premix    Order Specific Question:   Indication:    Answer:   Pneumonia   ceFEPIme (MAXIPIME) 2 g in sodium chloride 0.9 % 100 mL IVPB    Order Specific Question:   Antibiotic Indication:    Answer:   HCAP   vancomycin (VANCOCIN) IVPB 1000 mg/200 mL premix    Order Specific Question:   Indication:    Answer:   Pneumonia    -I have reviewed the patients home medicines and have made adjustments as needed  Critical interventions none    Cardiac Monitoring: The patient was maintained on a cardiac monitor.  I personally viewed and interpreted the cardiac monitored which showed an underlying rhythm of: NSR  Social Determinants of Health:  Factors impacting patients care include: Lives at home with family   Reevaluation: After the interventions noted above, I reevaluated the patient and found that they have :stayed the same  Co morbidities that complicate the patient evaluation  Past Medical History:  Diagnosis Date   A-fib (HCC)    Acute encephalopathy 12/30/2022   Brain concussion 09/22/1958   Cancer (HCC)    left-sided breast cancer   CAP (community acquired pneumonia) 08/17/2022   Concussion 09/22/1958   with left-sided neuro defecits   E coli bacteremia 12/31/2022   E. coli UTI 01/02/2023   Fall 11/20/2011   HTN (hypertension)    Hypoxic respiratory failure (HCC) 08/18/2022   Lower urinary tract infectious disease 12/30/2022   Osteoporosis    Pelvic fracture (HCC) 11/20/2011   Preop cardiovascular exam 01/20/2013   RSV (respiratory syncytial virus pneumonia) 08/18/2022   UTI (urinary tract infection) 01/20/2023      Dispostion: I considered admission for this patient, and disposition pending  completion of laboratory evaluation.  Please see provider signout for continuation of workup.     Final Clinical Impression(s) / ED Diagnoses Final diagnoses:  Hypoxia  Sepsis without acute organ dysfunction, due to unspecified organism [facility])  Pleural effusion on left     @PCDICTATION @    Glendora Score, MD 04/27/23 2018

## 2023-04-27 NOTE — ED Notes (Signed)
ED TO INPATIENT HANDOFF REPORT  ED Nurse Name and Phone #: Delfin Edis / [phone]  S Name/Age/Gender Gina Mcgee 89 y.o. female Room/Bed: 036C/036C  Code Status   Code Status: DNR  Home/SNF/Other Home Patient oriented to: self and situation Is this baseline? Yes   Triage Complete: Triage complete  Chief Complaint Sepsis [facility]) [A41.9]  Triage Note Pt BIB EMS from home, failure to thrive, was discharged from [facility], recent Covid, on 4L [place], and since her discharge she hasn't been at baseline.   130/63 HR 102 Resp 40  96% 4L    Allergies Allergies  Allergen Reactions   Aspirin Other (See Comments)    Gets jitter with large doses   Cipro [Ciprofloxacin Hcl] Diarrhea   Doxycycline Diarrhea   Fish Allergy Nausea Only and Other (See Comments)    "makes me jumpy and twitchy, but grand children are allergic"    Nsaids     Can take for short periods of time. Naprosyn -feels dazed   Shellfish-Derived Products Other (See Comments)    "makes me jumpy and twitchy, but grand children are allergic"   Aloe Rash   Latex Rash   Sulfa Antibiotics Rash    Level of Care/Admitting Diagnosis ED Disposition     ED Disposition  Admit   Condition  --   Comment  Hospital Area: [facility] [[id number]]  Level of Care: Progressive [102]  Admit to Progressive based on following criteria: MULTISYSTEM THREATS such as stable sepsis, metabolic/electrolyte imbalance with or without encephalopathy that is responding to early treatment.  May place patient in observation at [facility] or [facility] if equivalent level of care is available:: No  Covid Evaluation: Asymptomatic - no recent exposure (last 10 days) testing not required  Diagnosis: Sepsis [facility]) [[id number]]  Admitting Physician: Synetta Fail [[id number]]  Attending Physician: Synetta Fail [[id number]]          B Medical/Surgery History Past Medical History:  Diagnosis Date   A-fib (HCC)     Acute encephalopathy 12/30/2022   Brain concussion 09/22/1958   Cancer (HCC)    left-sided breast cancer   CAP (community acquired pneumonia) 08/17/2022   Concussion 09/22/1958   with left-sided neuro defecits   E coli bacteremia 12/31/2022   E. coli UTI 01/02/2023   Fall 11/20/2011   HTN (hypertension)    Hypoxic respiratory failure (HCC) 08/18/2022   Lower urinary tract infectious disease 12/30/2022   Osteoporosis    Pelvic fracture (HCC) 11/20/2011   Preop cardiovascular exam 01/20/2013   RSV (respiratory syncytial virus pneumonia) 08/18/2022   UTI (urinary tract infection) 01/20/2023   Past Surgical History:  Procedure Laterality Date   BOWEL RESECTION  approx. in 2007   polyp and formed stricture   BREAST LUMPECTOMY  1981   left lumpectomy w/ radiation   COLONOSCOPY W/ POLYPECTOMY     FEMUR SURGERY  2011   left femur pinning   JOINT REPLACEMENT     ORIF HUMERUS FRACTURE Left 01/25/2013   Procedure: OPEN REDUCTION INTERNAL FIXATION (ORIF) DISTAL HUMERUS FRACTURE REPAIR RECONSTRUCTION AND ULNA NERVE DECOMPRESSION AND ANTERIOR TRANSPOSITION AS NECESSARY;  Surgeon: Dominica Severin, MD;  Location: [facility] OR;  Service: Orthopedics;  Laterality: Left;     A IV Location/Drains/Wounds Patient Lines/Drains/Airways Status     Active Line/Drains/Airways     Name Placement date Placement time Site Days   Peripheral IV 04/27/23 20 G 1" Right Antecubital 04/27/23  1759  Antecubital  less than  1   Peripheral IV 04/27/23 20 G 1" Anterior;Left;Proximal Forearm 04/27/23  1500  Forearm  less than 1            Intake/Output Last 24 hours No intake or output data in the 24 hours ending 04/27/23 2216  Labs/Imaging Results for orders placed or performed during the hospital encounter of 04/27/23 (from the past 48 hour(s))  Comprehensive metabolic panel     Status: Abnormal   Collection Time: 04/27/23  2:05 PM  Result Value Ref Range   Sodium 131 (L) 135 - 145 mmol/L   Potassium 3.9  3.5 - 5.1 mmol/L   Chloride 90 (L) 98 - 111 mmol/L   CO2 24 22 - 32 mmol/L   Glucose, Bld 106 (H) 70 - 99 mg/dL    Comment: Glucose reference range applies only to samples taken after fasting for at least 8 hours.   BUN 20 8 - 23 mg/dL   Creatinine, Ser 2.95 (H) 0.44 - 1.00 mg/dL   Calcium 8.5 (L) 8.9 - 10.3 mg/dL   Total Protein 6.7 6.5 - 8.1 g/dL   Albumin 2.5 (L) 3.5 - 5.0 g/dL   AST 45 (H) 15 - 41 U/L   ALT 41 0 - 44 U/L   Alkaline Phosphatase 135 (H) 38 - 126 U/L   Total Bilirubin 0.7 0.3 - 1.2 mg/dL   GFR, Estimated 50 (L) >60 mL/min    Comment: (NOTE) Calculated using the CKD-EPI Creatinine Equation (2021)    Anion gap 17 (H) 5 - 15    Comment: Performed at [facility], [address] [place], [place] [address]  Troponin I (High Sensitivity)     Status: Abnormal   Collection Time: 04/27/23  2:05 PM  Result Value Ref Range   Troponin I (High Sensitivity) 21 (H) <18 ng/L    Comment: (NOTE) Elevated high sensitivity troponin I (hsTnI) values and significant  changes across serial measurements may suggest ACS but many other  chronic and acute conditions are known to elevate hsTnI results.  Refer to the "Links" section for chest pain algorithms and additional  guidance. Performed at [facility], [address] [place], [place] [address]   CBC with Differential     Status: Abnormal   Collection Time: 04/27/23  2:05 PM  Result Value Ref Range   WBC 20.2 (H) 4.0 - 10.5 K/uL   RBC 4.04 3.87 - 5.11 MIL/uL   Hemoglobin 11.5 (L) 12.0 - 15.0 g/dL   HCT 46.9 62.9 - 52.8 %   MCV 89.9 80.0 - 100.0 fL   MCH 28.5 26.0 - 34.0 pg   MCHC 31.7 30.0 - 36.0 g/dL   RDW 41.3 (H) 24.4 - 01.0 %   Platelets 387 150 - 400 K/uL   nRBC 0.0 0.0 - 0.2 %   Neutrophils Relative % 93 %   Neutro Abs 18.7 (H) 1.7 - 7.7 K/uL   Lymphocytes Relative 3 %   Lymphs Abs 0.5 (L) 0.7 - 4.0 K/uL   Monocytes Relative 2 %   Monocytes Absolute 0.5 0.1 - 1.0 K/uL   Eosinophils Relative 0 %    Eosinophils Absolute 0.1 0.0 - 0.5 K/uL   Basophils Relative 0 %   Basophils Absolute 0.1 0.0 - 0.1 K/uL   Immature Granulocytes 2 %   Abs Immature Granulocytes 0.40 (H) 0.00 - 0.07 K/uL    Comment: Performed at [facility], [address], [place], [place] [address]  I-Stat  venous blood gas, ED     Status: Abnormal   Collection Time: 04/27/23  3:11 PM  Result Value Ref Range   pH, Ven 7.567 (H) 7.25 - 7.43   pCO2, Ven 36.2 (L) 44 - 60 mmHg   pO2, Ven 53 (H) 32 - 45 mmHg   Bicarbonate 33.0 (H) 20.0 - 28.0 mmol/L   TCO2 34 (H) 22 - 32 mmol/L   O2 Saturation 92 %   Acid-Base Excess 10.0 (H) 0.0 - 2.0 mmol/L   Sodium 130 (L) 135 - 145 mmol/L   Potassium 4.6 3.5 - 5.1 mmol/L   Calcium, Ion 1.01 (L) 1.15 - 1.40 mmol/L   HCT 34.0 (L) 36.0 - 46.0 %   Hemoglobin 11.6 (L) 12.0 - 15.0 g/dL   Sample type VENOUS   Troponin I (High Sensitivity)     Status: Abnormal   Collection Time: 04/27/23  4:10 PM  Result Value Ref Range   Troponin I (High Sensitivity) 32 (H) <18 ng/L    Comment: (NOTE) Elevated high sensitivity troponin I (hsTnI) values and significant  changes across serial measurements may suggest ACS but many other  chronic and acute conditions are known to elevate hsTnI results.  Refer to the "Links" section for chest pain algorithms and additional  guidance. Performed at [facility], [address], [place], [place] [address]   I-Stat CG4 Lactic Acid     Status: None   Collection Time: 04/27/23  6:03 PM  Result Value Ref Range   Lactic Acid, Venous 1.5 0.5 - 1.9 mmol/L  I-Stat CG4 Lactic Acid     Status: Abnormal   Collection Time: 04/27/23  9:15 PM  Result Value Ref Range   Lactic Acid, Venous 2.0 (HH) 0.5 - 1.9 mmol/L   DG Chest 1 View  Result Date: 04/27/2023 CLINICAL DATA:  Pain. EXAM: CHEST  1 VIEW COMPARISON:  Radiograph 04/23/2023, CT 04/19/2023 FINDINGS: Significant patient rotation. Heart is grossly normal in size. Left lung base opacity likely in part  related to retrocardiac hiatal hernia. Small left pleural effusion is similar. The right lung is clear. No pneumothorax. The bones are diffusely under mineralized. IMPRESSION: Left lung base opacity likely in part related to retrocardiac hiatal hernia. Small left pleural effusion is similar. Electronically Signed   By: Narda Rutherford M.D.   On: 04/27/2023 15:09    Pending Labs Unresulted Labs (From admission, onward)     Start     Ordered   04/28/23 0500  Protime-INR  Tomorrow morning,   R        04/27/23 1818   04/28/23 0500  Cortisol-am, blood  Tomorrow morning,   R        04/27/23 1818   04/28/23 0500  Procalcitonin  Tomorrow morning,   R       References:    Procalcitonin Lower Respiratory Tract Infection AND Sepsis Procalcitonin Algorithm   04/27/23 1818   04/28/23 0500  Comprehensive metabolic panel  Tomorrow morning,   R        04/27/23 1818   04/28/23 0500  CBC  Tomorrow morning,   R        04/27/23 1818   04/27/23 1704  MRSA Next Gen by PCR, Nasal  (MRSA Screening)  Once,   URGENT        04/27/23 1703   04/27/23 1657  Blood culture (routine x 2)  BLOOD CULTURE X 2,   R      04/27/23 1657  04/27/23 1411  Urinalysis, Routine w reflex microscopic -Urine, Clean Catch  Once,   URGENT       Question:  Specimen Source  Answer:  Urine, Clean Catch   04/27/23 1410   04/27/23 1410  Blood gas, venous (at [facility] and [facility])  Once,   R        04/27/23 1409            Vitals/Pain Today's Vitals   04/27/23 1815 04/27/23 1845 04/27/23 2014 04/27/23 2015  BP: (!) 112/59 (!) 100/57  110/69  Pulse: 90 89  86  Resp: (!) 26   16  Temp:    97.8 F (36.6 C)  TempSrc:    Oral  SpO2: 99% 98%  100%  Weight:      Height:      PainSc:   0-No pain     Isolation Precautions No active isolations  Medications Medications  lactated ringers infusion ( Intravenous New Bag/Given 04/27/23 2036)  metoprolol succinate (TOPROL-XL) 24 hr tablet 25 mg (has no administration in time range)  memantine  (NAMENDA) tablet 10 mg (10 mg Oral Given 04/27/23 2208)  apixaban (ELIQUIS) tablet 2.5 mg (2.5 mg Oral Given 04/27/23 2208)  sodium chloride flush (NS) 0.9 % injection 3 mL (3 mLs Intravenous Given 04/27/23 2205)  acetaminophen (TYLENOL) tablet 650 mg (has no administration in time range)    Or  acetaminophen (TYLENOL) suppository 650 mg (has no administration in time range)  polyethylene glycol (MIRALAX / GLYCOLAX) packet 17 g (has no administration in time range)  ceFEPIme (MAXIPIME) 2 g in sodium chloride 0.9 % 100 mL IVPB (has no administration in time range)  vancomycin (VANCOCIN) IVPB 1000 mg/200 mL premix (has no administration in time range)  lactated ringers bolus 1,000 mL (0 mLs Intravenous Stopped 04/27/23 2036)  vancomycin (VANCOCIN) IVPB 1000 mg/200 mL premix (0 mg Intravenous Stopped 04/27/23 2205)  ceFEPIme (MAXIPIME) 2 g in sodium chloride 0.9 % 100 mL IVPB (0 g Intravenous Stopped 04/27/23 2030)  lactated ringers bolus 500 mL (0 mLs Intravenous Stopped 04/27/23 2157)    Mobility non-ambulatory     Focused Assessments Musculoskeletal    R Recommendations: See Admitting Provider Note  Report given to:   Additional Notes: Pt remains on 3L O2 via [place].

## 2023-04-27 NOTE — Sepsis Progress Note (Signed)
Elink monitoring for the code sepsis protocol.  

## 2023-04-27 NOTE — H&P (Signed)
History and Physical   Gina Mcgee NFA:[id number] DOB: 1929/09/25 DOA: 04/27/2023  PCP: Gina Dimitri, MD   Patient coming from: Home  Chief Complaint: Lethargy, hypoxia, altered mental status  HPI: Gina Mcgee is a 89 y.o. female with medical history significant of hypertension, GERD, A-fib, anemia, bladder cancer, breast cancer, myocardial impairment, UTIs, bacteremia presenting with altered mental status, lethargy, hypoxia.  Patient was recently admitted from 7/28 until 8/3 at [facility] with acute respiratory failure believed to be secondary to COVID-19 infection as well as pulmonary edema from flash pulmonary edema.  Did have some improvement on BiPAP and Lasix from the flash pulmonary edema but did require 2 to 3 L of supplemental oxygen on discharge.  Also discharged with home health.  Since returning home family is reported patient has not been at baseline in terms of overall weakness and mobility.  Today noticed increased work of breathing and had increased home oxygen to 4 L.  EMS was called and transported patient to the ED.  Patient denies fevers, chills, chest pain, abdominal pain, constipation, diarrhea, nausea, vomiting.  ED Course: Vital signs in the ED notable for blood pressure in the 90s to 120 systolic, heart rate in the 90s to 100s, respiratory rate in the 20s to 30s, required 4 L to maintain saturations.  Lab workup included CMP with sodium 131, chloride 90, bicarb normal with a gap of 17, calcium 8.5, albumin 2.5, AST 45, alk phos 135.  CBC with leukocytosis to 20.2, hemoglobin stable at 11.5.  Troponin 21 on first check with repeat pending.  Lactic acid pending.  MRSA screen pending.  Blood cultures pending.  Urinalysis pending.  VBG with pH 7.56 and pCO2 of 36.7.  Chest x-ray showed left lower lobe opacity that could be in part secondary to hiatal hernia also noted was similar/stable pleural effusion.  Patient started on vancomycin/cefepime in the ED also  received a liter of IV fluids and started on a rate of IV fluids.  Review of Systems: As per HPI otherwise all other systems reviewed and are negative.  Past Medical History:  Diagnosis Date   A-fib [facility])    Acute encephalopathy 12/30/2022   Brain concussion 09/22/1958   Cancer (HCC)    left-sided breast cancer   CAP (community acquired pneumonia) 08/17/2022   Concussion 09/22/1958   with left-sided neuro defecits   E coli bacteremia 12/31/2022   E. coli UTI 01/02/2023   Fall 11/20/2011   HTN (hypertension)    Hypoxic respiratory failure (HCC) 08/18/2022   Lower urinary tract infectious disease 12/30/2022   Osteoporosis    Pelvic fracture (HCC) 11/20/2011   Preop cardiovascular exam 01/20/2013   RSV (respiratory syncytial virus pneumonia) 08/18/2022   UTI (urinary tract infection) 01/20/2023    Past Surgical History:  Procedure Laterality Date   BOWEL RESECTION  approx. in 2007   polyp and formed stricture   BREAST LUMPECTOMY  1981   left lumpectomy w/ radiation   COLONOSCOPY W/ POLYPECTOMY     FEMUR SURGERY  2011   left femur pinning   JOINT REPLACEMENT     ORIF HUMERUS FRACTURE Left 01/25/2013   Procedure: OPEN REDUCTION INTERNAL FIXATION (ORIF) DISTAL HUMERUS FRACTURE REPAIR RECONSTRUCTION AND ULNA NERVE DECOMPRESSION AND ANTERIOR TRANSPOSITION AS NECESSARY;  Surgeon: Dominica Severin, MD;  Location: [facility] OR;  Service: Orthopedics;  Laterality: Left;    Social History  reports that she has never smoked. She has never used smokeless tobacco. She reports that she  does not currently use alcohol. She reports that she does not use drugs.  Allergies  Allergen Reactions   Aspirin Other (See Comments)    Gets jitter with large doses   Cipro [Ciprofloxacin Hcl] Diarrhea   Doxycycline Diarrhea   Fish Allergy Nausea Only and Other (See Comments)    "makes me jumpy and twitchy, but grand children are allergic"    Nsaids     Can take for short periods of time. Naprosyn -feels  dazed   Shellfish-Derived Products Other (See Comments)    "makes me jumpy and twitchy, but grand children are allergic"   Aloe Rash   Latex Rash   Sulfa Antibiotics Rash    Family History  Problem Relation Age of Onset   Stroke Mother    Lung cancer Father    Stroke Father   Reviewed on admission  Prior to Admission medications   Medication Sig Start Date End Date Taking? Authorizing Provider  acetaminophen (TYLENOL) 500 MG tablet Take 500 mg by mouth every 6 (six) hours as needed for moderate pain.   Yes [provider]  apixaban (ELIQUIS) 2.5 MG TABS tablet Take 1 tablet (2.5 mg total) by mouth 2 (two) times daily. 12/10/22  Yes [name], Manish J, MD  Cholecalciferol (VITAMIN D3) 10 MCG (400 UNIT) CAPS Take 400 Units by mouth 2 (two) times daily.   Yes [provider]  cyanocobalamin (VITAMIN B12) 1000 MCG/ML injection Inject 1,000 mcg into the muscle every 30 (thirty) days.   Yes [provider]  diltiazem (CARDIZEM CD) 120 MG 24 hr capsule Take 1 capsule (120 mg total) by mouth daily. Patient taking differently: Take 120 mg by mouth every evening. 01/30/23  Yes [name], Manish J, MD  ferrous sulfate 325 (65 FE) MG tablet Take 1 tablet (325 mg total) by mouth every 3 (three) days. 01/02/23  Yes Rodolph Bong, MD  furosemide (LASIX) 40 MG tablet Take 1 tablet (40 mg total) by mouth daily. 04/24/23 04/23/24 Yes Willeen Niece, MD  guaiFENesin-dextromethorphan (ROBITUSSIN DM) 100-10 MG/5ML syrup Take 5 mLs by mouth every 4 (four) hours as needed for cough (chest congestion). 04/24/23  Yes Willeen Niece, MD  ketoconazole (NIZORAL) 2 % cream Apply 1 Application topically daily as needed for irritation. Apply to toes   Yes [provider]  memantine (NAMENDA) 10 MG tablet Take 10 mg by mouth 2 (two) times daily.   Yes [provider]  metoprolol succinate (TOPROL-XL) 25 MG 24 hr tablet Take 1 tablet (25 mg total) by mouth daily. 01/30/23  Yes  [name], [name], MD  Multiple Vitamins-Minerals (MULTIVITAMIN WITH MINERALS) tablet Take 1 tablet by mouth every evening.   Yes [provider]  Saccharomyces boulardii (PROBIOTIC) 250 MG CAPS Take 250 mg by mouth daily.   Yes [provider]  senna-docusate (SENOKOT-S) 8.6-50 MG tablet Take 1 tablet by mouth daily.   Yes [provider]    Physical Exam: Vitals:   04/27/23 1403 04/27/23 1408 04/27/23 1642  BP:  124/63 (!) 126/59  Pulse:  (!) 102 91  Resp:  (!) 40 20  Temp:  98.2 F (36.8 C) 98.1 F (36.7 C)  TempSrc:  Oral Oral  SpO2:  96% 98%  Weight: 50.8 kg    Height: 5' (1.524 m)      Physical Exam Constitutional:      General: She is not in acute distress.    Comments: Tired, somewhat frail appearing elderly female  HENT:  Head: Normocephalic and atraumatic.     Mouth/Throat:     Mouth: Mucous membranes are moist.     Pharynx: Oropharynx is clear.  Eyes:     Extraocular Movements: Extraocular movements intact.     Pupils: Pupils are equal, round, and reactive to light.  Cardiovascular:     Rate and Rhythm: Normal rate and regular rhythm.     Pulses: Normal pulses.     Heart sounds: Normal heart sounds.  Pulmonary:     Effort: Pulmonary effort is normal. No respiratory distress.     Breath sounds: Normal breath sounds.  Abdominal:     General: Bowel sounds are normal. There is no distension.     Palpations: Abdomen is soft.     Tenderness: There is no abdominal tenderness.  Musculoskeletal:        General: No swelling or deformity.  Skin:    General: Skin is warm and dry.  Neurological:     General: No focal deficit present.     Mental Status: Mental status is at baseline.     Labs on Admission: I have personally reviewed following labs and imaging studies  CBC: Recent Labs  Lab 04/21/23 0420 04/22/23 0443 04/24/23 0413 04/27/23 1405 04/27/23 1511  WBC 12.7* 13.2* 15.4* 20.2*  --   NEUTROABS  --   --   --  18.7*   --   HGB 11.3* 11.6* 10.8* 11.5* 11.6*  HCT 35.0* 36.6 34.2* 36.3 34.0*  MCV 88.4 87.1 90.2 89.9  --   PLT 208 236 243 387  --     Basic Metabolic Panel: Recent Labs  Lab 04/21/23 0420 04/22/23 0443 04/24/23 0413 04/27/23 1405 04/27/23 1511  NA 126* 125* 129* 131* 130*  K 3.4* 3.7 3.8 3.9 4.6  CL 90* 91* 94* 90*  --   CO2 25 23 24 24   --   GLUCOSE 120* 129* 118* 106*  --   BUN 21 26* 28* 20  --   CREATININE 1.08* 1.01* 1.07* 1.05*  --   CALCIUM 8.4* 8.6* 8.5* 8.5*  --   MG 2.0 1.9 2.3  --   --   PHOS 2.6 2.3* 3.5  --   --     GFR: Estimated Creatinine Clearance: 25.6 mL/min (A) (by C-G formula based on SCr of 1.05 mg/dL (H)).  Liver Function Tests: Recent Labs  Lab 04/27/23 1405  AST 45*  ALT 41  ALKPHOS 135*  BILITOT 0.7  PROT 6.7  ALBUMIN 2.5*    Urine analysis:    Component Value Date/Time   COLORURINE YELLOW 01/19/2023 2203   APPEARANCEUR HAZY (A) 01/19/2023 2203   LABSPEC 1.020 01/19/2023 2203   PHURINE 5.5 01/19/2023 2203   GLUCOSEU NEGATIVE 01/19/2023 2203   HGBUR MODERATE (A) 01/19/2023 2203   BILIRUBINUR NEGATIVE 01/19/2023 2203   KETONESUR NEGATIVE 01/19/2023 2203   PROTEINUR 30 (A) 01/19/2023 2203   NITRITE POSITIVE (A) 01/19/2023 2203   LEUKOCYTESUR LARGE (A) 01/19/2023 2203    Radiological Exams on Admission: DG Chest 1 View  Result Date: 04/27/2023 CLINICAL DATA:  Pain. EXAM: CHEST  1 VIEW COMPARISON:  Radiograph 04/23/2023, CT 04/19/2023 FINDINGS: Significant patient rotation. Heart is grossly normal in size. Left lung base opacity likely in part related to retrocardiac hiatal hernia. Small left pleural effusion is similar. The right lung is clear. No pneumothorax. The bones are diffusely under mineralized. IMPRESSION: Left lung base opacity likely in part related to retrocardiac hiatal hernia. Small left pleural effusion  is similar. Electronically Signed   By: Narda Rutherford M.D.   On: 04/27/2023 15:09    EKG: Independently reviewed.   Sinus rhythm at 98 bpm.  Baseline wander.  Nonspecific T wave flattening.  PAC versus sinus arrhythmia.  Assessment/Plan Principal Problem:   Sepsis (HCC) Active Problems:   HTN (hypertension)   History of breast cancer in female   Atrial fibrillation (HCC)   Gastroesophageal reflux disease without esophagitis   Bladder cancer (HCC)   Anemia   Acute hypoxemic respiratory failure (HCC)   Sepsis Respiratory failure > Continue respiratory failure and has worsened from discharge.  Discharged on 2 to 3 L supplemental oxygen now requiring 4 L as of today. > Concern for sepsis given patient was tachycardic, tachypneic, requiring additional oxygen, leukocytosis to 20.2.  Uncertain etiology concern for possible superimposed bacterial pneumonia on known COVID-19 infection considering left lower lobe opacity with unclear etiology.  Also has history of recurrent UTI and bacteremia. > Started on cefepime and vancomycin in the ED given recent hospitalization.  MRSA screen pending.  Lactic acid pending.  Currently maintaining blood pressures.  Received 1 L IV fluids. - Monitor on progressive unit overnight - Continue with vancomycin and cefepime for now - Follow-up MRSA screen - Follow-up urinalysis - Follow-up blood cultures - Trend fever curve and WBC - Follow-up lactic acid - Procalcitonin in the morning - Continue supplemental oxygen, wean as tolerated  Hypertension - Hold home diltiazem and Lasix in the setting of developing sepsis - Continue home metoprolol given concurrent atrial fibrillation and recent RVR last admission  Atrial fibrillation - Holding home diltiazem as above - Continue home metoprolol - Continue home Eliquis  GERD/LPR - Not currently on PPI  Mild cognitive impairment - Continue home Namenda  History of bladder cancer History of breast cancer - Noted  DVT prophylaxis: Eliquis Code Status:   DNR/DNI, otherwise full scope  Family Communication:  Updated at  bedside  Disposition Plan:   Patient is from:  Home  Anticipated DC to:  Pending clinical course  Anticipated DC date:  1 to 7 days  Anticipated DC barriers: None  Consults called:  None Admission status:  Observation, progressive  Severity of Illness: The appropriate patient status for this patient is OBSERVATION. Observation status is judged to be reasonable and necessary in order to provide the required intensity of service to ensure the patient's safety. The patient's presenting symptoms, physical exam findings, and initial radiographic and laboratory data in the context of their medical condition is felt to place them at decreased risk for further clinical deterioration. Furthermore, it is anticipated that the patient will be medically stable for discharge from the hospital within 2 midnights of admission.    Synetta Fail MD [facility]  How to contact the [facility] Attending or Consulting provider 7A - 7P or covering provider during after hours 7P -7A, for this patient?   Check the care team in [facility] and look for a) attending/consulting [facility] provider listed and b) the [facility] team listed Log into [email or web address] and use [place]'s universal password to access. If you do not have the password, please contact the hospital operator. Locate the [facility] provider you are looking for under [facility] and page to a number that you can be directly reached. If you still have difficulty reaching the provider, please page the [facility] (Director on Call) for the Hospitalists listed on amion for assistance.  04/27/2023, 6:21 PM

## 2023-04-27 NOTE — ED Provider Notes (Signed)
Patient signed out to me at 1500 by Dr. Posey Rea pending labs and X-ray with plan for likely admission.  [name] with a past medical history of hypertension, A-fib on Eliquis and recent admission for COVID and hypoxia was presented to the emergency department with altered mental status.  The patient was tachypneic and mildly tachycardic on arrival here.  She did have a pleural effusion at her last hospitalization but did not have a thoracentesis performed at that time.  EKG shows sinus rhythm without acute ischemic changes.  Chest x-ray showed stable appearing left-sided pleural effusion.  Clinical Course as of 04/27/23 1743  Mon Apr 27, 2023  1657 WBC 20, with tachycardia and tachypnea meets sepsis criteria and will have cultures sent and antibiotics started. [VK]  1721 Mildly low sodium and elevated AGAP, lactic is pending and she is receiving fluids. Plan is for admission for sepsis and concern for superimposed pneumonia in the setting of recent COVID infection. She is mildly tachypneic satting well on 4L [place]. Was sent home on 2L [place].  [VK]    Clinical Course User Index [VK] Rexford Maus, DO      Rexford Maus, [place] 04/27/23 1743

## 2023-04-28 ENCOUNTER — Observation Stay (HOSPITAL_COMMUNITY): Payer: Medicare Other

## 2023-04-28 ENCOUNTER — Inpatient Hospital Stay (HOSPITAL_COMMUNITY): Payer: Medicare Other

## 2023-04-28 DIAGNOSIS — K219 Gastro-esophageal reflux disease without esophagitis: Secondary | ICD-10-CM | POA: Diagnosis present

## 2023-04-28 DIAGNOSIS — Z8616 Personal history of COVID-19: Secondary | ICD-10-CM | POA: Diagnosis not present

## 2023-04-28 DIAGNOSIS — J984 Other disorders of lung: Secondary | ICD-10-CM | POA: Diagnosis not present

## 2023-04-28 DIAGNOSIS — Z801 Family history of malignant neoplasm of trachea, bronchus and lung: Secondary | ICD-10-CM | POA: Diagnosis not present

## 2023-04-28 DIAGNOSIS — R0602 Shortness of breath: Secondary | ICD-10-CM | POA: Diagnosis not present

## 2023-04-28 DIAGNOSIS — Z853 Personal history of malignant neoplasm of breast: Secondary | ICD-10-CM | POA: Diagnosis not present

## 2023-04-28 DIAGNOSIS — R109 Unspecified abdominal pain: Secondary | ICD-10-CM | POA: Diagnosis not present

## 2023-04-28 DIAGNOSIS — J9601 Acute respiratory failure with hypoxia: Secondary | ICD-10-CM | POA: Diagnosis present

## 2023-04-28 DIAGNOSIS — R54 Age-related physical debility: Secondary | ICD-10-CM | POA: Diagnosis present

## 2023-04-28 DIAGNOSIS — M419 Scoliosis, unspecified: Secondary | ICD-10-CM | POA: Diagnosis not present

## 2023-04-28 DIAGNOSIS — Z823 Family history of stroke: Secondary | ICD-10-CM | POA: Diagnosis not present

## 2023-04-28 DIAGNOSIS — I110 Hypertensive heart disease with heart failure: Secondary | ICD-10-CM | POA: Diagnosis present

## 2023-04-28 DIAGNOSIS — R6520 Severe sepsis without septic shock: Secondary | ICD-10-CM | POA: Diagnosis not present

## 2023-04-28 DIAGNOSIS — Z8551 Personal history of malignant neoplasm of bladder: Secondary | ICD-10-CM | POA: Diagnosis not present

## 2023-04-28 DIAGNOSIS — Z7401 Bed confinement status: Secondary | ICD-10-CM | POA: Diagnosis not present

## 2023-04-28 DIAGNOSIS — R627 Adult failure to thrive: Secondary | ICD-10-CM | POA: Diagnosis present

## 2023-04-28 DIAGNOSIS — Z79899 Other long term (current) drug therapy: Secondary | ICD-10-CM | POA: Diagnosis not present

## 2023-04-28 DIAGNOSIS — Z91040 Latex allergy status: Secondary | ICD-10-CM | POA: Diagnosis not present

## 2023-04-28 DIAGNOSIS — M4856XA Collapsed vertebra, not elsewhere classified, lumbar region, initial encounter for fracture: Secondary | ICD-10-CM | POA: Diagnosis not present

## 2023-04-28 DIAGNOSIS — R918 Other nonspecific abnormal finding of lung field: Secondary | ICD-10-CM | POA: Diagnosis not present

## 2023-04-28 DIAGNOSIS — I480 Paroxysmal atrial fibrillation: Secondary | ICD-10-CM | POA: Diagnosis present

## 2023-04-28 DIAGNOSIS — J189 Pneumonia, unspecified organism: Secondary | ICD-10-CM | POA: Diagnosis present

## 2023-04-28 DIAGNOSIS — G3184 Mild cognitive impairment, so stated: Secondary | ICD-10-CM | POA: Diagnosis present

## 2023-04-28 DIAGNOSIS — M47814 Spondylosis without myelopathy or radiculopathy, thoracic region: Secondary | ICD-10-CM | POA: Diagnosis not present

## 2023-04-28 DIAGNOSIS — M48061 Spinal stenosis, lumbar region without neurogenic claudication: Secondary | ICD-10-CM | POA: Diagnosis not present

## 2023-04-28 DIAGNOSIS — Z7901 Long term (current) use of anticoagulants: Secondary | ICD-10-CM | POA: Diagnosis not present

## 2023-04-28 DIAGNOSIS — M5136 Other intervertebral disc degeneration, lumbar region: Secondary | ICD-10-CM | POA: Diagnosis not present

## 2023-04-28 DIAGNOSIS — M5134 Other intervertebral disc degeneration, thoracic region: Secondary | ICD-10-CM | POA: Diagnosis not present

## 2023-04-28 DIAGNOSIS — G8929 Other chronic pain: Secondary | ICD-10-CM | POA: Diagnosis present

## 2023-04-28 DIAGNOSIS — R0902 Hypoxemia: Secondary | ICD-10-CM | POA: Diagnosis present

## 2023-04-28 DIAGNOSIS — A419 Sepsis, unspecified organism: Secondary | ICD-10-CM | POA: Diagnosis present

## 2023-04-28 DIAGNOSIS — I5022 Chronic systolic (congestive) heart failure: Secondary | ICD-10-CM | POA: Diagnosis present

## 2023-04-28 DIAGNOSIS — Z882 Allergy status to sulfonamides status: Secondary | ICD-10-CM | POA: Diagnosis not present

## 2023-04-28 DIAGNOSIS — Z91013 Allergy to seafood: Secondary | ICD-10-CM | POA: Diagnosis not present

## 2023-04-28 DIAGNOSIS — Z66 Do not resuscitate: Secondary | ICD-10-CM | POA: Diagnosis present

## 2023-04-28 DIAGNOSIS — K5900 Constipation, unspecified: Secondary | ICD-10-CM | POA: Diagnosis present

## 2023-04-28 DIAGNOSIS — M4854XA Collapsed vertebra, not elsewhere classified, thoracic region, initial encounter for fracture: Secondary | ICD-10-CM | POA: Diagnosis not present

## 2023-04-28 DIAGNOSIS — Z886 Allergy status to analgesic agent status: Secondary | ICD-10-CM | POA: Diagnosis not present

## 2023-04-28 DIAGNOSIS — R410 Disorientation, unspecified: Secondary | ICD-10-CM | POA: Diagnosis not present

## 2023-04-28 LAB — CBC WITH DIFFERENTIAL/PLATELET
Abs Immature Granulocytes: 0.49 10*3/uL — ABNORMAL HIGH (ref 0.00–0.07)
Abs Immature Granulocytes: 0.32 10*3/uL — ABNORMAL HIGH (ref 0.00–0.07)
Basophils Absolute: 0.1 10*3/uL (ref 0.0–0.1)
Basophils Absolute: 0.1 10*3/uL (ref 0.0–0.1)
Basophils Relative: 0 %
Basophils Relative: 1 %
Eosinophils Absolute: 0.1 10*3/uL (ref 0.0–0.5)
Eosinophils Absolute: 0.1 10*3/uL (ref 0.0–0.5)
Eosinophils Relative: 1 %
Eosinophils Relative: 1 %
HCT: 24.5 % — ABNORMAL LOW (ref 36.0–46.0)
HCT: 29.4 % — ABNORMAL LOW (ref 36.0–46.0)
Hemoglobin: 7.7 g/dL — ABNORMAL LOW (ref 12.0–15.0)
Hemoglobin: 9.5 g/dL — ABNORMAL LOW (ref 12.0–15.0)
Immature Granulocytes: 3 %
Immature Granulocytes: 3 %
Lymphocytes Relative: 8 %
Lymphocytes Relative: 7 %
Lymphs Abs: 1.4 10*3/uL (ref 0.7–4.0)
Lymphs Abs: 0.8 10*3/uL (ref 0.7–4.0)
MCH: 27.9 pg (ref 26.0–34.0)
MCH: 28.4 pg (ref 26.0–34.0)
MCHC: 31.4 g/dL (ref 30.0–36.0)
MCHC: 32.3 g/dL (ref 30.0–36.0)
MCV: 88.8 fL (ref 80.0–100.0)
MCV: 87.8 fL (ref 80.0–100.0)
Monocytes Absolute: 1 10*3/uL (ref 0.1–1.0)
Monocytes Absolute: 0.6 10*3/uL (ref 0.1–1.0)
Monocytes Relative: 6 %
Monocytes Relative: 5 %
Neutro Abs: 15.3 10*3/uL — ABNORMAL HIGH (ref 1.7–7.7)
Neutro Abs: 10.7 10*3/uL — ABNORMAL HIGH (ref 1.7–7.7)
Neutrophils Relative %: 82 %
Neutrophils Relative %: 83 %
Platelets: 405 10*3/uL — ABNORMAL HIGH (ref 150–400)
Platelets: 339 10*3/uL (ref 150–400)
RBC: 2.76 MIL/uL — ABNORMAL LOW (ref 3.87–5.11)
RBC: 3.35 MIL/uL — ABNORMAL LOW (ref 3.87–5.11)
RDW: 15.8 % — ABNORMAL HIGH (ref 11.5–15.5)
RDW: 15.6 % — ABNORMAL HIGH (ref 11.5–15.5)
WBC: 18.4 10*3/uL — ABNORMAL HIGH (ref 4.0–10.5)
WBC: 12.6 10*3/uL — ABNORMAL HIGH (ref 4.0–10.5)
nRBC: 0 % (ref 0.0–0.2)
nRBC: 0 % (ref 0.0–0.2)

## 2023-04-28 LAB — COMPREHENSIVE METABOLIC PANEL
ALT: 32 U/L (ref 0–44)
AST: 32 U/L (ref 15–41)
Albumin: 2.2 g/dL — ABNORMAL LOW (ref 3.5–5.0)
Alkaline Phosphatase: 120 U/L (ref 38–126)
Anion gap: 11 (ref 5–15)
BUN: 16 mg/dL (ref 8–23)
CO2: 26 mmol/L (ref 22–32)
Calcium: 8.5 mg/dL — ABNORMAL LOW (ref 8.9–10.3)
Chloride: 94 mmol/L — ABNORMAL LOW (ref 98–111)
Creatinine, Ser: 0.82 mg/dL (ref 0.44–1.00)
GFR, Estimated: >60 mL/min (ref >60)
Glucose, Bld: 95 mg/dL (ref 70–99)
Potassium: 3.8 mmol/L (ref 3.5–5.1)
Sodium: 131 mmol/L — ABNORMAL LOW (ref 135–145)
Total Bilirubin: 0.5 mg/dL (ref 0.3–1.2)
Total Protein: 6 g/dL — ABNORMAL LOW (ref 6.5–8.1)

## 2023-04-28 LAB — MAGNESIUM: Magnesium: 1.8 mg/dL (ref 1.7–2.4)

## 2023-04-28 LAB — PROCALCITONIN: Procalcitonin: 4.63 ng/mL

## 2023-04-28 LAB — TSH: TSH: 4.638 u[IU]/mL — ABNORMAL HIGH (ref 0.350–4.500)

## 2023-04-28 LAB — BRAIN NATRIURETIC PEPTIDE: B Natriuretic Peptide: 352.9 pg/mL — ABNORMAL HIGH (ref 0.0–100.0)

## 2023-04-28 LAB — PROTIME-INR
INR: 1.3 — ABNORMAL HIGH (ref 0.8–1.2)
Prothrombin Time: 15.9 seconds — ABNORMAL HIGH (ref 11.4–15.2)

## 2023-04-28 LAB — CORTISOL-AM, BLOOD: Cortisol - AM: 17.2 ug/dL (ref 6.7–22.6)

## 2023-04-28 LAB — C-REACTIVE PROTEIN: CRP: 19.2 mg/dL — ABNORMAL HIGH (ref <1.0)

## 2023-04-28 MED ORDER — SENNOSIDES-DOCUSATE SODIUM 8.6-50 MG PO TABS
1.0000 | ORAL_TABLET | Freq: Two times a day (BID) | ORAL | Status: DC
  Administered 2023-04-29 – 2023-05-01 (×3): 1 via ORAL
  Filled 2023-04-28 (×4): qty 1

## 2023-04-28 MED ORDER — CYANOCOBALAMIN 1000 MCG/ML IJ SOLN: 1000.0000 ug | INTRAMUSCULAR | Status: DC

## 2023-04-28 MED ORDER — OYSTER SHELL CALCIUM/D3 500-5 MG-MCG PO TABS
2.0000 | ORAL_TABLET | Freq: Two times a day (BID) | ORAL | Status: DC
  Administered 2023-04-29 – 2023-05-01 (×5): 2 via ORAL
  Filled 2023-04-28 (×5): qty 2

## 2023-04-28 MED ORDER — LACTATED RINGERS IV SOLN: INTRAVENOUS | Status: AC

## 2023-04-28 MED ORDER — SODIUM CHLORIDE 0.9 % IV SOLN
3.0000 g | Freq: Three times a day (TID) | INTRAVENOUS | Status: DC
  Administered 2023-04-28 – 2023-05-01 (×10): 3 g via INTRAVENOUS
  Filled 2023-04-28 (×10): qty 8

## 2023-04-28 MED ORDER — DILTIAZEM HCL 60 MG PO TABS
60.0000 mg | ORAL_TABLET | Freq: Three times a day (TID) | ORAL | Status: DC
  Administered 2023-04-28 – 2023-05-01 (×9): 60 mg via ORAL
  Filled 2023-04-28 (×9): qty 1

## 2023-04-28 NOTE — Progress Notes (Signed)
PROGRESS NOTE                                                                                                                                                                                                             Patient Demographics:    Gina Mcgee, is a 89 y.o. female, DOB - [birth year], IRJ:[id number]  Outpatient Primary MD for the patient is [name], MD    LOS - 0  Admit date - 04/27/2023    Chief Complaint  Patient presents with   Failure To Thrive       Brief Narrative (HPI from H&P)   89 y.o. female with medical history significant of hypertension, GERD, A-fib, anemia, bladder cancer, breast cancer, myocardial impairment, UTIs, bacteremia presenting with altered mental status, lethargy, hypoxia.  Diagnosed with pneumonia, she is also been having low back pain for the last several weeks.  Admitted for pneumonia treatment.  Of note she was recently admitted to [facility] and treated for COVID-pneumonia.   Subjective:    Gina Mcgee today has, No headache, No chest pain, No abdominal pain - No Nausea, No new weakness tingling or numbness, mild cough, some shortness of breath on exertion, ongoing low back pain.   Assessment  & Plan :   Acute hypoxic respiratory failure due to pneumonia.  Recently treated for COVID-19, procalcitonin mildly elevated along with CRP, also has some intermittent aspiration history at home, currently placed on Unasyn, hypoxia improving, add I-S and flutter valve for pulmonary toiletry, speech eval, follow cultures.  Advance activity and titrate down oxygen.  Ongoing low back pain.  Per family ongoing for 7 to 10 days at least, severely cachectic, has history of osteoporosis risk for compression fracture, check CT T and L-spine.  Hypertension.  Blood pressure stable placed on beta-blocker and home dose Cardizem.  Paroxysmal A-fib.  CHA2DS2-VASc 2 score of greater than 3.   On beta-blocker, Cardizem and Eliquis.  Mild cognitive impairment.  On Namenda.  At risk for delirium.  History of bladder and breast cancer.  Supportive care.      Condition - Extremely Guarded  Family Communication  :  daughter bedside 04/28/23  Code Status :  DNR  Consults  :  None  PUD Prophylaxis :    Procedures  :     CT T & L  Spine -       Disposition Plan  :    Status is: Observation  DVT Prophylaxis  :    apixaban (ELIQUIS) tablet 2.5 mg Start: 04/27/23 2200 apixaban (ELIQUIS) tablet 2.5 mg     Lab Results  Component Value Date   PLT 405 (H) 04/28/2023    Diet :  Diet Order             Diet regular Room service appropriate? Yes; Fluid consistency: Thin  Diet effective now                    Inpatient Medications  Scheduled Meds:  apixaban  2.5 mg Oral BID   memantine  10 mg Oral BID   metoprolol succinate  25 mg Oral Daily   sodium chloride flush  3 mL Intravenous Q12H   Continuous Infusions:  ceFEPime (MAXIPIME) IV     lactated ringers     PRN Meds:.acetaminophen **OR** acetaminophen, polyethylene glycol    Objective:   Vitals:   04/27/23 2315 04/27/23 2358 04/28/23 0557 04/28/23 0800  BP: 137/79 (!) 148/83 (!) 153/79   Pulse: 89 93 91   Resp: 16 20 (!) 24   Temp:  97.8 F (36.6 C) 98.2 F (36.8 C) 97.7 F (36.5 C)  TempSrc:  Oral Axillary Oral  SpO2: 100% 99% 95%   Weight:      Height:        Wt Readings from Last 3 Encounters:  04/27/23 50.8 kg  04/19/23 50.8 kg  01/30/23 54.4 kg    No intake or output data in the 24 hours ending 04/28/23 1049   Physical Exam  Frail elderly white female lying in hospital bed in no distress, awake Alert, No new F.N deficits, Normal affect [place].AT,PERRAL Supple Neck, No JVD,   Symmetrical Chest wall movement, Good air movement bilaterally, Coarse bibasilar B sounds RRR,No Gallops,Rubs or new Murmurs,  +ve B.Sounds, Abd Soft, No tenderness,   No Cyanosis, Clubbing or edema         Data Review:    Recent Labs  Lab 04/22/23 0443 04/24/23 0413 04/27/23 1405 04/27/23 1511 04/28/23 0659  WBC 13.2* 15.4* 20.2*  --  18.4*  HGB 11.6* 10.8* 11.5* 11.6* 7.7*  HCT 36.6 34.2* 36.3 34.0* 24.5*  PLT 236 243 387  --  405*  MCV 87.1 90.2 89.9  --  88.8  MCH 27.6 28.5 28.5  --  27.9  MCHC 31.7 31.6 31.7  --  31.4  RDW 15.8* 15.9* 15.7*  --  15.8*  LYMPHSABS  --   --  0.5*  --  1.4  MONOABS  --   --  0.5  --  1.0  EOSABS  --   --  0.1  --  0.1  BASOSABS  --   --  0.1  --  0.1    Recent Labs  Lab 04/22/23 0443 04/24/23 0413 04/27/23 1405 04/27/23 1511 04/27/23 1803 04/27/23 2115 04/28/23 0659  NA 125* 129* 131* 130*  --   --  131*  K 3.7 3.8 3.9 4.6  --   --  3.8  CL 91* 94* 90*  --   --   --  94*  CO2 23 24 24   --   --   --  26  ANIONGAP 11 11 17*  --   --   --  11  GLUCOSE 129* 118* 106*  --   --   --  95  BUN 26* 28* 20  --   --   --  16  CREATININE 1.01* 1.07* 1.05*  --   --   --  0.82  AST  --   --  45*  --   --   --  32  ALT  --   --  41  --   --   --  32  ALKPHOS  --   --  135*  --   --   --  120  BILITOT  --   --  0.7  --   --   --  0.5  ALBUMIN  --   --  2.5*  --   --   --  2.2*  CRP  --   --   --   --   --   --  19.2*  PROCALCITON  --   --   --   --   --   --  4.63  LATICACIDVEN  --   --   --   --  1.5 2.0*  --   INR  --   --   --   --   --   --  1.3*  BNP  --   --   --   --   --   --  352.9*  MG 1.9 2.3  --   --   --   --  1.8  CALCIUM 8.6* 8.5* 8.5*  --   --   --  8.5*      Recent Labs  Lab 04/22/23 0443 04/24/23 0413 04/27/23 1405 04/27/23 1803 04/27/23 2115 04/28/23 0659  CRP  --   --   --   --   --  19.2*  PROCALCITON  --   --   --   --   --  4.63  LATICACIDVEN  --   --   --  1.5 2.0*  --   INR  --   --   --   --   --  1.3*  BNP  --   --   --   --   --  352.9*  MG 1.9 2.3  --   --   --  1.8  CALCIUM 8.6* 8.5* 8.5*  --   --  8.5*      Micro Results Recent Results (from the past 240 hour(s))  Blood culture (routine x  2)     Status: None (Preliminary result)   Collection Time: 04/27/23  4:57 PM   Specimen: BLOOD  Result Value Ref Range Status   Specimen Description BLOOD SITE NOT SPECIFIED  Final   Special Requests   Final    BOTTLES DRAWN AEROBIC AND ANAEROBIC Blood Culture adequate volume   Culture  Setup Time   Final    GRAM NEGATIVE RODS AEROBIC BOTTLE ONLY Organism ID to follow Performed at [facility], [address] [place], [place] [address]    Culture GRAM NEGATIVE RODS  Final   Report Status PENDING  Incomplete  Blood culture (routine x 2)     Status: None (Preliminary result)   Collection Time: 04/27/23  5:42 PM   Specimen: BLOOD  Result Value Ref Range Status   Specimen Description BLOOD SITE NOT SPECIFIED  Final   Special Requests   Final    BOTTLES DRAWN AEROBIC AND ANAEROBIC Blood Culture adequate volume   Culture   Final    NO GROWTH < 12 HOURS Performed at [facility], [address] [place], [place] [address]    Report Status PENDING  Incomplete  MRSA Next Gen by PCR, Nasal     Status: None   Collection Time: 04/28/23  1:23 AM   Specimen: Nasal Mucosa; Nasal Swab  Result Value Ref Range Status   MRSA by PCR Next Gen NOT DETECTED NOT DETECTED Final    Comment: (NOTE) The GeneXpert MRSA Assay (FDA approved for NASAL specimens only), is one component of a comprehensive MRSA colonization surveillance program. It is not intended to diagnose MRSA infection nor to guide or monitor treatment for MRSA infections. Test performance is not FDA approved in patients less than 74 years old. Performed at [facility], [address], [place], [place] [address]     Radiology Reports DG Chest [place] 1 View  Result Date: 04/28/2023 CLINICAL DATA:  Shortness of breath. EXAM: PORTABLE CHEST 1 VIEW COMPARISON:  Chest radiograph 04/27/2023 and chest CTA 04/19/2023 FINDINGS: The patient remains rotated to the left with unchanged cardiomediastinal silhouette. Left lung  base opacity is similar to the prior radiograph and likely reflects a combination of the previously shown hiatal hernia, a persistent small left pleural effusion, and left basilar atelectasis or consolidation. Interstitial type densities in the left upper lung in the right lung base have increased. No pneumothorax is identified. IMPRESSION: 1. Unchanged small left pleural effusion and left basilar atelectasis or consolidation. 2. Increased interstitial densities in the left upper lung and right lung base which may reflect edema or infection. Electronically Signed   By: Sebastian Ache M.D.   On: 04/28/2023 07:57   DG Chest 1 View  Result Date: 04/27/2023 CLINICAL DATA:  Pain. EXAM: CHEST  1 VIEW COMPARISON:  Radiograph 04/23/2023, CT 04/19/2023 FINDINGS: Significant patient rotation. Heart is grossly normal in size. Left lung base opacity likely in part related to retrocardiac hiatal hernia. Small left pleural effusion is similar. The right lung is clear. No pneumothorax. The bones are diffusely under mineralized. IMPRESSION: Left lung base opacity likely in part related to retrocardiac hiatal hernia. Small left pleural effusion is similar. Electronically Signed   By: Narda Rutherford M.D.   On: 04/27/2023 15:09      Signature  -   Susa Raring M.D on 04/28/2023 at 10:49 AM   -  To page go to [email or web address]

## 2023-04-28 NOTE — Progress Notes (Signed)
PHARMACY - PHYSICIAN COMMUNICATION CRITICAL VALUE ALERT - BLOOD CULTURE IDENTIFICATION (BCID)  Gina Mcgee is an 89 y.o. female who presented to [facility] on 04/27/2023 with a chief complaint of lethargy, hypoxia, and altered mental status.  Assessment: 1 aerobic blood culture bottle positive for gram negative rods. BCID showing Klebsiella pneumoniae, no resistance detected. Source thought to be respiratory.   Name of physician (or Provider) Contacted: P. Thedore Mins, MD  Current antibiotics: Unasyn  Changes to prescribed antibiotics recommended:  Usual recommendation for K. pneumo is ceftriaxone 2g daily. Dr. wants to continue Unasyn for now due to concern for active aspiration.  Results for orders placed or performed during the hospital encounter of 04/27/23  Blood Culture ID Panel (Reflexed) (Collected: 04/27/2023  4:57 PM)  Result Value Ref Range   Enterococcus faecalis NOT DETECTED NOT DETECTED   Enterococcus Faecium NOT DETECTED NOT DETECTED   Listeria monocytogenes NOT DETECTED NOT DETECTED   Staphylococcus species NOT DETECTED NOT DETECTED   Staphylococcus aureus (BCID) NOT DETECTED NOT DETECTED   Staphylococcus epidermidis NOT DETECTED NOT DETECTED   Staphylococcus lugdunensis NOT DETECTED NOT DETECTED   Streptococcus species NOT DETECTED NOT DETECTED   Streptococcus agalactiae NOT DETECTED NOT DETECTED   Streptococcus pneumoniae NOT DETECTED NOT DETECTED   Streptococcus pyogenes NOT DETECTED NOT DETECTED   A.calcoaceticus-baumannii NOT DETECTED NOT DETECTED   Bacteroides fragilis NOT DETECTED NOT DETECTED   Enterobacterales DETECTED (A) NOT DETECTED   Enterobacter cloacae complex NOT DETECTED NOT DETECTED   Escherichia coli NOT DETECTED NOT DETECTED   Klebsiella aerogenes NOT DETECTED NOT DETECTED   Klebsiella oxytoca NOT DETECTED NOT DETECTED   Klebsiella pneumoniae DETECTED (A) NOT DETECTED   Proteus species NOT DETECTED NOT DETECTED   Salmonella species NOT DETECTED NOT  DETECTED   Serratia marcescens NOT DETECTED NOT DETECTED   Haemophilus influenzae NOT DETECTED NOT DETECTED   Neisseria meningitidis NOT DETECTED NOT DETECTED   Pseudomonas aeruginosa NOT DETECTED NOT DETECTED   Stenotrophomonas maltophilia NOT DETECTED NOT DETECTED   Candida albicans NOT DETECTED NOT DETECTED   Candida auris NOT DETECTED NOT DETECTED   Candida glabrata NOT DETECTED NOT DETECTED   Candida krusei NOT DETECTED NOT DETECTED   Candida parapsilosis NOT DETECTED NOT DETECTED   Candida tropicalis NOT DETECTED NOT DETECTED   Cryptococcus neoformans/gattii NOT DETECTED NOT DETECTED   CTX-M ESBL NOT DETECTED NOT DETECTED   Carbapenem resistance IMP NOT DETECTED NOT DETECTED   Carbapenem resistance KPC NOT DETECTED NOT DETECTED   Carbapenem resistance NDM NOT DETECTED NOT DETECTED   Carbapenem resist OXA 48 LIKE NOT DETECTED NOT DETECTED   Carbapenem resistance VIM NOT DETECTED NOT DETECTED    Romie Minus, PharmD PGY1 Pharmacy Resident  Please check AMION for all [facility] Pharmacy phone numbers After 10:00 PM, call Main Pharmacy [phone]

## 2023-04-28 NOTE — Plan of Care (Signed)
  Problem: Fluid Volume: Goal: Hemodynamic stability will improve Outcome: Not Progressing   Problem: Clinical Measurements: Goal: Diagnostic test results will improve Outcome: Not Progressing Goal: Signs and symptoms of infection will decrease Outcome: Not Progressing   Problem: Respiratory: Goal: Ability to maintain adequate ventilation will improve Outcome: Not Progressing   Problem: Education: Goal: Knowledge of General Education information will improve Description: Including pain rating scale, medication(s)/side effects and non-pharmacologic comfort measures Outcome: Not Progressing   Problem: Health Behavior/Discharge Planning: Goal: Ability to manage health-related needs will improve Outcome: Not Progressing   Problem: Clinical Measurements: Goal: Ability to maintain clinical measurements within normal limits will improve Outcome: Not Progressing Goal: Will remain free from infection Outcome: Not Progressing Goal: Diagnostic test results will improve Outcome: Not Progressing Goal: Respiratory complications will improve Outcome: Not Progressing Goal: Cardiovascular complication will be avoided Outcome: Not Progressing   Problem: Activity: Goal: Risk for activity intolerance will decrease Outcome: Not Progressing   Problem: Nutrition: Goal: Adequate nutrition will be maintained Outcome: Not Progressing   Problem: Coping: Goal: Level of anxiety will decrease Outcome: Not Progressing   Problem: Elimination: Goal: Will not experience complications related to bowel motility Outcome: Not Progressing Goal: Will not experience complications related to urinary retention Outcome: Not Progressing   Problem: Pain Managment: Goal: General experience of comfort will improve Outcome: Not Progressing   Problem: Safety: Goal: Ability to remain free from injury will improve Outcome: Not Progressing   Problem: Skin Integrity: Goal: Risk for impaired skin integrity  will decrease Outcome: Not Progressing   Problem: Fluid Volume: Goal: Hemodynamic stability will improve Outcome: Not Progressing   Problem: Clinical Measurements: Goal: Diagnostic test results will improve Outcome: Not Progressing Goal: Signs and symptoms of infection will decrease Outcome: Not Progressing   Problem: Respiratory: Goal: Ability to maintain adequate ventilation will improve Outcome: Not Progressing

## 2023-04-28 NOTE — Plan of Care (Signed)
  Problem: Respiratory: Goal: Ability to maintain adequate ventilation will improve Outcome: Progressing   Problem: Safety: Goal: Ability to remain free from injury will improve Outcome: Progressing   Problem: Skin Integrity: Goal: Risk for impaired skin integrity will decrease Outcome: Progressing   Problem: Respiratory: Goal: Ability to maintain adequate ventilation will improve Outcome: Progressing

## 2023-04-28 NOTE — Evaluation (Signed)
Occupational Therapy Evaluation Patient Details Name: Gina Mcgee MRN: [id number] DOB: 1930/02/04 Today's Date: 04/28/2023   History of Present Illness 89 y.o. female with medical history significant of hypertension, GERD, A-fib, anemia, bladder cancer, breast cancer, myocardial impairment, UTIs, bacteremia presenting with altered mental status, lethargy, hypoxia.  Diagnosed with pneumonia, she is also been having low back pain for the last several weeks.  Admitted for pneumonia treatment.  Of note she was recently admitted to [facility] and treated for COVID-pneumonia.   Clinical Impression   Pt s/p above diagnosis. Pt c/o low back pain with movement, limiting mobility and OOB activities today. Family present during session. Pt lives at home with daughter and have aides available 7 days/week. Pt's daughter states they are trying to get hoyer and hospital bed for home to allow for daughter to transfer Pt when no other help available. Pt currently presents with significant fatigue, B shoulder stiffness/weakness, limited use of LUE, no functional AROM in L hand, not able to roll L/R without significant pain, did not attempt OOB activity due to pain today. Pt would benefit greatly from continued skilled therapy to maximize functional participation with ADLs/transfers, HHOT recommended to improve safety and strength/endurance to functional level upon return home, Pt and family stated they would like to use [facility], declines placement to post acute rehab facility.     Recommendations for follow up therapy are one component of a multi-disciplinary discharge planning process, led by the attending physician.  Recommendations may be updated based on patient status, additional functional criteria and insurance authorization.   Assistance Recommended at Discharge Frequent or constant Supervision/Assistance  Patient can return home with the following A lot of help with walking and/or transfers;A  lot of help with bathing/dressing/bathroom;Assistance with cooking/housework;Direct supervision/assist for medications management;Assist for transportation;Help with stairs or ramp for entrance    Functional Status Assessment  Patient has had a recent decline in their functional status and demonstrates the ability to make significant improvements in function in a reasonable and predictable amount of time.  Equipment Recommendations  None recommended by OT    Recommendations for Other Services       Precautions / Restrictions Precautions Precautions: Fall Precaution Comments: monitor O2; no functional use of L hand Restrictions Weight Bearing Restrictions: No      Mobility Bed Mobility Overal bed mobility: Needs Assistance             General bed mobility comments: did not attempt, family states she is getting xray of low back soon, refused mobility    Transfers Overall transfer level: Needs assistance                 General transfer comment: did not attempt, family states she is getting xray of low back soon, refused mobility      Balance Overall balance assessment: Needs assistance                                         ADL either performed or assessed with clinical judgement   ADL Overall ADL's : Needs assistance/impaired Eating/Feeding: Moderate assistance   Grooming: Moderate assistance;Bed level   Upper Body Bathing: Bed level;Maximal assistance   Lower Body Bathing: Bed level;Total assistance   Upper Body Dressing : Maximal assistance;Bed level   Lower Body Dressing: Total assistance;Bed level       Toileting- Clothing Manipulation and Hygiene:  Total assistance;Bed level         General ADL Comments: Pt at baseline needs assistance with dressing/bathing, sometimes with toileting/feeding. Since recent hospitalization Pt requires increased assistance for all activities. Due to low back pain transfer/bed mobility not  attempted, screams out when turned/rolled     Vision Baseline Vision/History: 1 Wears glasses Ability to See in Adequate Light: 0 Adequate Patient Visual Report: No change from baseline       Perception     Praxis      Pertinent Vitals/Pain Pain Assessment Pain Assessment: 0-10 Pain Score: 4  Faces Pain Scale: Hurts little more Pain Location: low back Pain Descriptors / Indicators: Moaning, Discomfort, Grimacing Pain Intervention(s): Monitored during session     Hand Dominance Right   Extremity/Trunk Assessment Upper Extremity Assessment Upper Extremity Assessment: Generalized weakness;LUE deficits/detail LUE Deficits / Details: no functional grip or FM in L hand, family states from prior L elbow fx. B shoulder stiffness. LUE: Shoulder pain with ROM LUE Sensation: decreased light touch LUE Coordination: decreased fine motor;decreased gross motor   Lower Extremity Assessment Lower Extremity Assessment: Defer to PT evaluation       Communication Communication Communication: HOH   Cognition Arousal/Alertness: Awake/alert Behavior During Therapy: WFL for tasks assessed/performed Overall Cognitive Status: History of cognitive impairments - at baseline                                 General Comments: Pt A/Ox2, oriented to self and situation, knew it was 2024 but not month/day, stated she was in [facility], where she was last week.     General Comments       Exercises     Shoulder Instructions      Home Living Family/patient expects to be discharged to:: Private residence Living Arrangements: Children Available Help at Discharge: Family;Personal care attendant;Available 24 hours/day Type of Home: House Home Access: Level entry     Home Layout: One level     Bathroom Shower/Tub: Walk-in shower;Tub/shower unit   Bathroom Toilet: Standard     Home Equipment: Rollator (4 wheels);Cane - single point;BSC/3in1;Shower seat;Grab bars -  toilet;Grab bars - tub/shower;Hand held shower head;Wheelchair - manual   Additional Comments: Has 24 hr assistance between [employer], personal care aide, & daughter. HHPT recently finished-pt agreeable to restart if needed      Prior Functioning/Environment Prior Level of Function : Needs assist       Physical Assist : Mobility (physical);ADLs (physical) Mobility (physical): Bed mobility;Transfers;Gait ADLs (physical): Grooming;Bathing;Dressing;Toileting;IADLs Mobility Comments: rollator works better due to L hand-no functional use. walking very short distance with min guard A (this varied day-to day) ADLs Comments: Assist with bathing, dressing, meals, IADLs        OT Problem List: Decreased strength;Decreased range of motion;Decreased activity tolerance;Impaired balance (sitting and/or standing);Decreased cognition;Impaired UE functional use;Pain      OT Treatment/Interventions: Self-care/ADL training;Therapeutic exercise;Neuromuscular education;Energy conservation;DME and/or AE instruction;Therapeutic activities;Cognitive remediation/compensation;Patient/family education    OT Goals(Current goals can be found in the care plan section) Acute Rehab OT Goals Patient Stated Goal: to return home OT Goal Formulation: With patient/family Time For Goal Achievement: 05/12/23 Potential to Achieve Goals: Fair  OT Frequency: Min 1X/week    Co-evaluation              AM-PAC OT "6 Clicks" Daily Activity     Outcome Measure Help from another person eating meals?: A Little Help  from another person taking care of personal grooming?: A Lot Help from another person toileting, which includes using toliet, bedpan, or urinal?: A Lot Help from another person bathing (including washing, rinsing, drying)?: Total Help from another person to put on and taking off regular upper body clothing?: A Lot Help from another person to put on and taking off regular lower body clothing?: Total 6  Click Score: 11   End of Session Equipment Utilized During Treatment: Oxygen Nurse Communication: Mobility status  Activity Tolerance: Patient limited by fatigue;Patient limited by pain Patient left: in bed;with call bell/phone within reach;with bed alarm set;with family/visitor present  OT Visit Diagnosis: Unsteadiness on feet (R26.81);Other abnormalities of gait and mobility (R26.89);Muscle weakness (generalized) (M62.81);Other symptoms and signs involving cognitive function;Pain Pain - part of body:  (back)                Time: 1610-9604 OT Time Calculation (min): 25 min Charges:  OT General Charges $OT Visit: 1 Visit OT Evaluation $OT Eval High Complexity: 1 High OT Treatments $Self Care/Home Management : 8-22 mins  [address] OTR/L   Alexis Goodell 04/28/2023, 12:28 PM

## 2023-04-28 NOTE — Evaluation (Signed)
Physical Therapy Evaluation Patient Details Name: Gina Mcgee MRN: [id number] DOB: [birth year] Today's Date: 04/28/2023  History of Present Illness  89 y.o. female presents to [facility] hospital on 04/27/2023 with AMS, lethargy, hypoxia. Pt recently admitted to [facility] from 7/28-8/3 with COVID. Pt with concern for PNA. Imaging of spine on 8/6 identifies multiple chronic compression fxs but no acute findings. PMH includes HTN, GERD, afib, anemia, bladder and breast CA, MI, UTIs.  Clinical Impression  Pt presents to PT with deficits in functional mobility, posture, balance, strength, endurance. Pt is very weak with incredibly poor trunk strength. Pt collapses into flexion when sitting at edge of bed without support, reports improved comfort in this hyperflexed position. Pt fatigues quickly and requires assistance to return to supine. PT provides recommendation for chest/abdominal support to utilize if attempting to get into wheelchair at home to avoid forward losses of balance out of chair. Pt will also benefit from a new hospital bed, along with a hoyer lift. Pt and family desire is to return home with continued 24/7 caregiver support and HHPT.      If plan is discharge home, recommend the following: Assistance with cooking/housework;Assist for transportation;Help with stairs or ramp for entrance;Two people to help with walking and/or transfers;Two people to help with bathing/dressing/bathroom   Can travel by private vehicle        Equipment Recommendations Hospital bed;Other (comment) (hoyer lift)  Recommendations for Other Services       Functional Status Assessment Patient has had a recent decline in their functional status and demonstrates the ability to make significant improvements in function in a reasonable and predictable amount of time.     Precautions / Restrictions Precautions Precautions: Fall Precaution Comments: monitor O2; no functional use of L hand Restrictions Weight  Bearing Restrictions: No      Mobility  Bed Mobility Overal bed mobility: Needs Assistance Bed Mobility: Rolling, Sidelying to Sit, Sit to Supine Rolling: Mod assist Sidelying to sit: Mod assist   Sit to supine: Total assist        Transfers                        Ambulation/Gait                  Stairs            Wheelchair Mobility     Tilt Bed    Modified Rankin (Stroke Patients Only)       Balance Overall balance assessment: Needs assistance Sitting-balance support: Single extremity supported, Feet supported Sitting balance-Leahy Scale: Poor Sitting balance - Comments: maxA to maintain a more upright posture otherwise pt fatigues quickly and collapses into flexion with head near knees                                     Pertinent Vitals/Pain Pain Assessment Pain Assessment: Faces Faces Pain Scale: Hurts even more Pain Location: back Pain Descriptors / Indicators: Aching Pain Intervention(s): Monitored during session    Home Living Family/patient expects to be discharged to:: Private residence Living Arrangements: Children Available Help at Discharge: Family;Personal care attendant;Available 24 hours/day Type of Home: House Home Access: Level entry       Home Layout: One level Home Equipment: Rollator (4 wheels);Cane - single point;BSC/3in1;Shower seat;Grab bars - toilet;Grab bars - tub/shower;Hand held shower head;Wheelchair - manual Additional Comments: Has 24  hr assistance between [employer], personal care aide, & daughter. HHPT recently finished-pt agreeable to restart if needed    Prior Function Prior Level of Function : Needs assist       Physical Assist : Mobility (physical);ADLs (physical) Mobility (physical): Bed mobility;Transfers;Gait ADLs (physical): Grooming;Bathing;Dressing;Toileting;IADLs Mobility Comments: prior to admission in july pt was transferring and ambulating for very short  distances with assist of caregivers and rollator. essentially bed bound since this admission ADLs Comments: Assist with bathing, dressing, meals, IADLs     Hand Dominance   Dominant Hand: Right    Extremity/Trunk Assessment   Upper Extremity Assessment Upper Extremity Assessment: Defer to OT evaluation LUE Deficits / Details: no functional grip or FM in L hand, family states from prior L elbow fx. B shoulder stiffness. LUE: Shoulder pain with ROM LUE Sensation: decreased light touch LUE Coordination: decreased fine motor;decreased gross motor    Lower Extremity Assessment Lower Extremity Assessment: Generalized weakness;LLE deficits/detail LLE Deficits / Details: LLE PF/DF 4-/5, knee extension 3/5    Cervical / Trunk Assessment Cervical / Trunk Assessment: Kyphotic (poor head and trunk control, pt tends to fall into flexion when sitting, reports this is also more comfortable on her back)  Communication   Communication: HOH  Cognition Arousal/Alertness: Awake/alert Behavior During Therapy: WFL for tasks assessed/performed Overall Cognitive Status: History of cognitive impairments - at baseline                                 General Comments: alert and oriented to person, place, year. Follows commands well        General Comments General comments (skin integrity, edema, etc.): VSS on 3L [place]    Exercises     Assessment/Plan    PT Assessment Patient needs continued PT services  PT Problem List Decreased strength;Decreased activity tolerance;Decreased balance;Decreased mobility;Cardiopulmonary status limiting activity;Pain       PT Treatment Interventions DME instruction;Functional mobility training;Therapeutic activities;Therapeutic exercise;Balance training;Neuromuscular re-education;Cognitive remediation;Patient/family education;Wheelchair mobility training    PT Goals (Current goals can be found in the Care Plan section)  Acute Rehab PT Goals Patient  Stated Goal: to return home PT Goal Formulation: With patient/family Time For Goal Achievement: 05/12/23 Potential to Achieve Goals: Fair    Frequency Min 1X/week     Co-evaluation               AM-PAC PT "6 Clicks" Mobility  Outcome Measure Help needed turning from your back to your side while in a flat bed without using bedrails?: A Lot Help needed moving from lying on your back to sitting on the side of a flat bed without using bedrails?: A Lot Help needed moving to and from a bed to a chair (including a wheelchair)?: Total Help needed standing up from a chair using your arms (e.g., wheelchair or bedside chair)?: Total Help needed to walk in hospital room?: Total Help needed climbing 3-5 steps with a railing? : Total 6 Click Score: 8    End of Session Equipment Utilized During Treatment: Oxygen Activity Tolerance: Patient limited by fatigue;Patient limited by pain Patient left: in bed;with call bell/phone within reach;with family/visitor present Nurse Communication: Mobility status;Need for lift equipment PT Visit Diagnosis: Other abnormalities of gait and mobility (R26.89);Muscle weakness (generalized) (M62.81)    Time: 2956-2130 PT Time Calculation (min) (ACUTE ONLY): 45 min   Charges:   PT Evaluation $PT Eval Moderate Complexity: 1 Mod   PT  General Charges $$ ACUTE PT VISIT: 1 Visit         Arlyss Gandy, PT, DPT Acute Rehabilitation Office [phone]   Arlyss Gandy 04/28/2023, 3:24 PM

## 2023-04-28 NOTE — Progress Notes (Signed)
Pharmacy Antibiotic Note  Gina Mcgee is a 88 y.o. female admitted on 04/27/2023 with pneumonia, concern with aspiration.  Pharmacy has been consulted to narrow to Unasyn.  Renal function stable, afebrile, WBC elevated.  Plan: Unasyn 3gm IV Q8H Pharmacy will sign off.  Thank you for the consult!  Height: 5' (152.4 cm) Weight: 50.8 kg (111 lb 15.9 oz) IBW/kg (Calculated) : 45.5  Temp (24hrs), Avg:98 F (36.7 C), Min:97.7 F (36.5 C), Max:98.2 F (36.8 C)  Recent Labs  Lab 04/22/23 0443 04/24/23 0413 04/27/23 1405 04/27/23 1803 04/27/23 2115 04/28/23 0659  WBC 13.2* 15.4* 20.2*  --   --  18.4*  CREATININE 1.01* 1.07* 1.05*  --   --  0.82  LATICACIDVEN  --   --   --  1.5 2.0*  --     Estimated Creatinine Clearance: 32.8 mL/min (by C-G formula based on SCr of 0.82 mg/dL).    Allergies  Allergen Reactions   Aspirin Other (See Comments)    Gets jitter with large doses   Cipro [Ciprofloxacin Hcl] Diarrhea   Doxycycline Diarrhea   Fish Allergy Nausea Only and Other (See Comments)    "makes me jumpy and twitchy, but grand children are allergic"    Nsaids     Can take for short periods of time. Naprosyn -feels dazed   Shellfish-Derived Products Other (See Comments)    "makes me jumpy and twitchy, but grand children are allergic"   Aloe Rash   Latex Rash   Sulfa Antibiotics Rash    Vanc 8/5 >> 8/6 Cefepime 8/5 >> 8/6 Unasyn 8/6 >> 8/9   8/5 BCx - 8/6 MRSA PCR - negative   D. Laney Potash, PharmD, BCPS, BCCCP 04/28/2023, 10:51 AM

## 2023-04-28 NOTE — Evaluation (Signed)
Clinical/Bedside Swallow Evaluation Patient Details  Name: Gina Mcgee MRN: [id number] Date of Birth: 06/15/1930  Today's Date: 04/28/2023 Time: SLP Start Time (ACUTE ONLY): 0836 SLP Stop Time (ACUTE ONLY): 0850 SLP Time Calculation (min) (ACUTE ONLY): 14 min  Past Medical History:  Past Medical History:  Diagnosis Date   A-fib (HCC)    Acute encephalopathy 12/30/2022   Brain concussion 09/22/1958   Cancer (HCC)    left-sided breast cancer   CAP (community acquired pneumonia) 08/17/2022   Concussion 09/22/1958   with left-sided neuro defecits   E coli bacteremia 12/31/2022   E. coli UTI 01/02/2023   Fall 11/20/2011   HTN (hypertension)    Hypoxic respiratory failure (HCC) 08/18/2022   Lower urinary tract infectious disease 12/30/2022   Osteoporosis    Pelvic fracture (HCC) 11/20/2011   Preop cardiovascular exam 01/20/2013   RSV (respiratory syncytial virus pneumonia) 08/18/2022   UTI (urinary tract infection) 01/20/2023   Past Surgical History:  Past Surgical History:  Procedure Laterality Date   BOWEL RESECTION  approx. in 2007   polyp and formed stricture   BREAST LUMPECTOMY  1981   left lumpectomy w/ radiation   COLONOSCOPY W/ POLYPECTOMY     FEMUR SURGERY  2011   left femur pinning   JOINT REPLACEMENT     ORIF HUMERUS FRACTURE Left 01/25/2013   Procedure: OPEN REDUCTION INTERNAL FIXATION (ORIF) DISTAL HUMERUS FRACTURE REPAIR RECONSTRUCTION AND ULNA NERVE DECOMPRESSION AND ANTERIOR TRANSPOSITION AS NECESSARY;  Surgeon: Dominica Severin, MD;  Location: [facility] OR;  Service: Orthopedics;  Laterality: Left;   HPI:  Gina Mcgee is a 89 y.o. female with medical history significant of hypertension, GERD, hiatal hernia, A-fib, anemia, bladder cancer, breast cancer, myocardial impairment, UTIs, bacteremia presenting with altered mental status, lethargy, hypoxia. Per chart patient was recently admitted from 7/28 until 8/3 at [facility] with acute respiratory failure  believed to be secondary to COVID-19 infection as well as pulmonary edema from flash pulmonary edema. Since returning home family is reported patient has not been at baseline in terms of overall weakness and mobility.  Noted to have lethargy, hypoxia and AMS.    Assessment / Plan / Recommendation  Clinical Impression  Pt's daughter reports pt has a hiatal hernia, gets choked with a straw sometimes if takes a big sip and had a swallow test that showed "she has a pocket that pills get stuck." (no prior ST notes seen). Pt's dentition is mostly intact and normal oromotor strength and ROM. She currently has Covid but her respirations are stable and remained throughout. Her swallow initiation appeared timely, no delays with mastication or oral residue. She exhibited no signs of aspiration with liquids or solids. Education daughter to keep pt upright following meals/snacks given hiatal hernia for at least 30 minutes. Continue regular texture, thin liquid. No further ST needed at this time. SLP Visit Diagnosis: Dysphagia, unspecified (R13.10)    Aspiration Risk  Mild aspiration risk    Diet Recommendation Regular;Thin liquid    Liquid Administration via: Cup;Straw Medication Administration: Whole meds with puree Supervision: Staff to assist with self feeding Compensations: Slow rate;Small sips/bites Postural Changes: Seated upright at 90 degrees;Remain upright for at least 30 minutes after po intake    Other  Recommendations Oral Care Recommendations: Oral care BID    Recommendations for follow up therapy are one component of a multi-disciplinary discharge planning process, led by the attending physician.  Recommendations may be updated based on patient status, additional functional criteria  and insurance authorization.  Follow up Recommendations No SLP follow up      Assistance Recommended at Discharge    Functional Status Assessment Patient has not had a recent decline in their functional status   Frequency and Duration            Prognosis        Swallow Study   General Date of Onset: 04/27/23 HPI: Gina Mcgee is a 89 y.o. female with medical history significant of hypertension, GERD, hiatal hernia, A-fib, anemia, bladder cancer, breast cancer, myocardial impairment, UTIs, bacteremia presenting with altered mental status, lethargy, hypoxia. Per chart patient was recently admitted from 7/28 until 8/3 at [facility] with acute respiratory failure believed to be secondary to COVID-19 infection as well as pulmonary edema from flash pulmonary edema. Since returning home family is reported patient has not been at baseline in terms of overall weakness and mobility.  Noted to have lethargy, hypoxia and AMS. Type of Study: Bedside Swallow Evaluation Previous Swallow Assessment:  (none) Diet Prior to this Study: Regular;Thin liquids (Level 0) Temperature Spikes Noted: No Respiratory Status: Nasal cannula History of Recent Intubation: No Behavior/Cognition: Alert;Cooperative;Pleasant mood Oral Cavity Assessment: Within Functional Limits Oral Care Completed by SLP: No Oral Cavity - Dentition: Adequate natural dentition Vision: Functional for self-feeding Self-Feeding Abilities: Needs assist Patient Positioning: Upright in bed Baseline Vocal Quality: Normal    Oral/Motor/Sensory Function Overall Oral Motor/Sensory Function: Within functional limits   Ice Chips Ice chips: Not tested   Thin Liquid Thin Liquid: Within functional limits Presentation: Straw    Nectar Thick Nectar Thick Liquid: Not tested   Honey Thick Honey Thick Liquid: Not tested   Puree Puree: Within functional limits   Solid     Solid: Within functional limits      Royce Macadamia 04/28/2023,9:03 AM

## 2023-04-29 ENCOUNTER — Ambulatory Visit: Payer: Medicare Other | Admitting: Internal Medicine

## 2023-04-29 DIAGNOSIS — A419 Sepsis, unspecified organism: Secondary | ICD-10-CM | POA: Diagnosis not present

## 2023-04-29 DIAGNOSIS — J9601 Acute respiratory failure with hypoxia: Secondary | ICD-10-CM | POA: Diagnosis not present

## 2023-04-29 DIAGNOSIS — R6520 Severe sepsis without septic shock: Secondary | ICD-10-CM | POA: Diagnosis not present

## 2023-04-29 DIAGNOSIS — I480 Paroxysmal atrial fibrillation: Secondary | ICD-10-CM | POA: Diagnosis not present

## 2023-04-29 MED ORDER — ENSURE ENLIVE PO LIQD
237.0000 mL | Freq: Three times a day (TID) | ORAL | Status: DC
  Administered 2023-04-29 – 2023-05-01 (×6): 237 mL via ORAL

## 2023-04-29 MED ORDER — ACETAMINOPHEN 500 MG PO TABS
500.0000 mg | ORAL_TABLET | Freq: Three times a day (TID) | ORAL | Status: DC
  Administered 2023-04-29 – 2023-05-01 (×6): 500 mg via ORAL
  Filled 2023-04-29 (×7): qty 1

## 2023-04-29 MED ORDER — ACETAMINOPHEN 325 MG PO TABS: 325.0000 mg | ORAL_TABLET | Freq: Four times a day (QID) | ORAL | Status: DC | PRN

## 2023-04-29 MED ORDER — ACETAMINOPHEN 650 MG RE SUPP: 325.0000 mg | Freq: Four times a day (QID) | RECTAL | Status: DC | PRN

## 2023-04-29 NOTE — Plan of Care (Signed)
  Problem: Respiratory: Goal: Ability to maintain adequate ventilation will improve Outcome: Progressing   Problem: Respiratory: Goal: Ability to maintain adequate ventilation will improve Outcome: Progressing   Problem: Education: Goal: Knowledge of General Education information will improve Description: Including pain rating scale, medication(s)/side effects and non-pharmacologic comfort measures Outcome: Progressing   Problem: Activity: Goal: Risk for activity intolerance will decrease Outcome: Progressing   Problem: Nutrition: Goal: Adequate nutrition will be maintained Outcome: Progressing   Problem: Coping: Goal: Level of anxiety will decrease Outcome: Progressing   Problem: Pain Managment: Goal: General experience of comfort will improve Outcome: Progressing   Problem: Safety: Goal: Ability to remain free from injury will improve Outcome: Progressing

## 2023-04-29 NOTE — Progress Notes (Signed)
PROGRESS NOTE                                                                                                                                                                                                             Patient Demographics:    Gina Mcgee, is a 89 y.o. female, DOB - 24-Jul-1930, ZOX:[id number]  Outpatient Primary MD for the patient is Gina Dimitri, MD    LOS - 1  Admit date - 04/27/2023    Chief Complaint  Patient presents with   Failure To Thrive       Brief Narrative (HPI from H&P)   89 y.o. female with medical history significant of hypertension, GERD, A-fib, anemia, bladder cancer, breast cancer, myocardial impairment, UTIs, bacteremia presenting with altered mental status, lethargy, hypoxia.  Diagnosed with pneumonia, she is also been having low back pain for the last several weeks.  Admitted for pneumonia treatment.  Of note she was recently admitted to [facility] and treated for COVID-pneumonia.   Subjective:    Gina Mcgee today for generalized weakness, fatigue, congestion and cough, she does report mild dyspnea as well    Assessment  & Plan :   Acute hypoxic respiratory failure due to pneumonia.   Recently treated for COVID-19, procalcitonin mildly elevated along with CRP, also has some intermittent aspiration history at home, currently placed on Unasyn, hypoxia improving, add I-S and flutter valve for pulmonary toiletry, speech eval, follow cultures.  Advance activity and titrate down oxygen.  Ongoing low back pain.   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  Hypertension.  Blood pressure stable placed on beta-blocker and home dose Cardizem.  Paroxysmal A-fib.  CHA2DS2-VASc 2 score of greater than 3.  On beta-blocker, Cardizem and Eliquis.  Mild cognitive impairment.  On Namenda.  At risk for delirium.  History of bladder and breast cancer.  Supportive care.      Condition -  Extremely Guarded  Family Communication  :  daughter bedside 04/28/23. 8/7  Code Status :  DNR  Consults  :  None  PUD Prophylaxis :    Procedures  :     CT T & L Spine -       Disposition Plan  :    DVT Prophylaxis  :    apixaban (ELIQUIS) tablet 2.5 mg Start: 04/27/23 2200 apixaban (ELIQUIS) tablet 2.5 mg     Lab Results  Component Value Date   PLT 359 04/29/2023    Diet :  Diet Order             Diet regular Room service appropriate? Yes; Fluid consistency: Thin  Diet effective now                    Inpatient Medications  Scheduled Meds:  acetaminophen  500 mg Oral TID   apixaban  2.5 mg Oral BID   calcium-vitamin D  2 tablet Oral BID   [START ON 05/10/2023] cyanocobalamin  1,000 mcg Intramuscular Q30 days   diltiazem  60 mg Oral Q8H   memantine  10 mg Oral BID   metoprolol succinate  25 mg Oral Daily   senna-docusate  1 tablet Oral BID   sodium chloride flush  3 mL Intravenous Q12H   Continuous Infusions:  ampicillin-sulbactam (UNASYN) IV 3 g (04/29/23 1059)   PRN Meds:.acetaminophen **OR** acetaminophen, polyethylene glycol    Objective:   Vitals:   04/29/23 0221 04/29/23 0316 04/29/23 0400 04/29/23 0756  BP:  (!) 140/70 121/67 123/66  Pulse:  83 71 74  Resp:  (!) 23 (!) 21 18  Temp:  98.3 F (36.8 C)  98.2 F (36.8 C)  TempSrc:  Oral  Oral  SpO2: 99% 97% 99% 97%  Weight:      Height:        Wt Readings from Last 3 Encounters:  04/27/23 50.8 kg  04/19/23  50.8 kg  01/30/23 54.4 kg     Intake/Output Summary (Last 24 hours) at 04/29/2023 1352 Last data filed at 04/29/2023 0600 Gross per 24 hour  Intake 586.79 ml  Output 700 ml  Net -113.21 ml     Physical Exam  Awake Alert, Oriented X 2, extremely frail, deconditioned Symmetrical Chest wall movement, diminished air entry bilaterally with rales RRR,No Gallops,Rubs or new Murmurs, No Parasternal Heave +ve B.Sounds, Abd Soft, No tenderness, No rebound - guarding or rigidity. No Cyanosis, Clubbing or edema, No new Rash or bruise          Data Review:    Recent Labs  Lab 04/24/23 0413 04/27/23 1405 04/27/23 1511 04/28/23 0659 04/28/23 1028 04/29/23 0035  WBC 15.4* 20.2*  --  18.4* 12.6* 12.3*  HGB 10.8* 11.5* 11.6* 7.7* 9.5* 9.3*  HCT 34.2* 36.3 34.0* 24.5* 29.4* 29.9*  PLT 243 387  --  405* 339 359  MCV 90.2 89.9  --  88.8 87.8 87.4  MCH 28.5 28.5  --  27.9 28.4 27.2  MCHC 31.6 31.7  --  31.4 32.3 31.1  RDW 15.9* 15.7*  --  15.8* 15.6* 15.7*  LYMPHSABS  --  0.5*  --  1.4 0.8 1.1  MONOABS  --  0.5  --  1.0 0.6 0.6  EOSABS  --  0.1  --  0.1 0.1 0.1  BASOSABS  --  0.1  --  0.1 0.1 0.1    Recent Labs  Lab 04/24/23 0413 04/27/23 1405 04/27/23 1511  04/27/23 1803 04/27/23 2115 04/28/23 0659 04/29/23 0035  NA 129* 131* 130*  --   --  131* 130*  K 3.8 3.9 4.6  --   --  3.8 3.6  CL 94* 90*  --   --   --  94* 94*  CO2 24 24  --   --   --  26 26  ANIONGAP 11 17*  --   --   --  11 10  GLUCOSE 118* 106*  --   --   --  95 104*  BUN 28* 20  --   --   --  16 13  CREATININE 1.07* 1.05*  --   --   --  0.82 0.77  AST  --  45*  --   --   --  32 30  ALT  --  41  --   --   --  32 30  ALKPHOS  --  135*  --   --   --  120 107  BILITOT  --  0.7  --   --   --  0.5 0.3  ALBUMIN  --  2.5*  --   --   --  2.2* 1.9*  CRP  --   --   --   --   --  19.2* 18.2*  PROCALCITON  --   --   --   --   --  4.63 3.36  LATICACIDVEN  --   --   --  1.5 2.0*  --   --   INR  --   --   --   --   --  1.3*   --   TSH  --   --   --   --   --  4.638*  --   BNP  --   --   --   --   --  352.9* 322.2*  MG 2.3  --   --   --   --  1.8 1.8  CALCIUM 8.5* 8.5*  --   --   --  8.5* 8.4*      Recent Labs  Lab 04/24/23 0413 04/27/23 1405 04/27/23 1803 04/27/23 2115 04/28/23 0659 04/29/23 0035  CRP  --   --   --   --  19.2* 18.2*  PROCALCITON  --   --   --   --  4.63 3.36  LATICACIDVEN  --   --  1.5 2.0*  --   --   INR  --   --   --   --  1.3*  --   TSH  --   --   --   --  4.638*  --   BNP  --   --   --   --  352.9* 322.2*  MG 2.3  --   --   --  1.8 1.8  CALCIUM 8.5* 8.5*  --   --  8.5* 8.4*      Micro Results Recent Results (from the past 240 hour(s))  Blood culture (routine x 2)     Status: Abnormal (Preliminary result)   Collection Time: 04/27/23  4:57 PM   Specimen: BLOOD  Result Value Ref Range Status   Specimen Description BLOOD SITE NOT SPECIFIED  Final   Special Requests   Final    BOTTLES DRAWN AEROBIC AND ANAEROBIC Blood Culture adequate volume   Culture  Setup Time   Final    GRAM NEGATIVE RODS AEROBIC BOTTLE ONLY  CRITICAL RESULT CALLED TO, READ BACK BY AND VERIFIED WITH: PHARMD S.NUDEY AT 1144 ON 04/28/2023 BY T.SAAD.    Culture (A)  Final    KLEBSIELLA PNEUMONIAE SUSCEPTIBILITIES TO FOLLOW Performed at [facility], [address] [place], [place] [address]    Report Status PENDING  Incomplete  Blood Culture ID Panel (Reflexed)     Status: Abnormal   Collection Time: 04/27/23  4:57 PM  Result Value Ref Range Status   Enterococcus faecalis NOT DETECTED NOT DETECTED Final   Enterococcus Faecium NOT DETECTED NOT DETECTED Final   Listeria monocytogenes NOT DETECTED NOT DETECTED Final   Staphylococcus species NOT DETECTED NOT DETECTED Final   Staphylococcus aureus (BCID) NOT DETECTED NOT DETECTED Final   Staphylococcus epidermidis NOT DETECTED NOT DETECTED Final   Staphylococcus lugdunensis NOT DETECTED NOT DETECTED Final   Streptococcus species NOT DETECTED NOT  DETECTED Final   Streptococcus agalactiae NOT DETECTED NOT DETECTED Final   Streptococcus pneumoniae NOT DETECTED NOT DETECTED Final   Streptococcus pyogenes NOT DETECTED NOT DETECTED Final   A.calcoaceticus-baumannii NOT DETECTED NOT DETECTED Final   Bacteroides fragilis NOT DETECTED NOT DETECTED Final   Enterobacterales DETECTED (A) NOT DETECTED Final    Comment: Enterobacterales represent a large order of gram negative bacteria, not a single organism. CRITICAL RESULT CALLED TO, READ BACK BY AND VERIFIED WITH: PHARMD S.NUDEY AT 1144 ON 04/28/2023 BY T.SAAD.    Enterobacter cloacae complex NOT DETECTED NOT DETECTED Final   Escherichia coli NOT DETECTED NOT DETECTED Final   Klebsiella aerogenes NOT DETECTED NOT DETECTED Final   Klebsiella oxytoca NOT DETECTED NOT DETECTED Final   Klebsiella pneumoniae DETECTED (A) NOT DETECTED Final    Comment: CRITICAL RESULT CALLED TO, READ BACK BY AND VERIFIED WITH: PHARMD S.NUDEY AT 1144 ON 04/28/2023 BY T.SAAD.    Proteus species NOT DETECTED NOT DETECTED Final   Salmonella species NOT DETECTED NOT DETECTED Final   Serratia marcescens NOT DETECTED NOT DETECTED Final   Haemophilus influenzae NOT DETECTED NOT DETECTED Final   Neisseria meningitidis NOT DETECTED NOT DETECTED Final   Pseudomonas aeruginosa NOT DETECTED NOT DETECTED Final   Stenotrophomonas maltophilia NOT DETECTED NOT DETECTED Final   Candida albicans NOT DETECTED NOT DETECTED Final   Candida auris NOT DETECTED NOT DETECTED Final   Candida glabrata NOT DETECTED NOT DETECTED Final   Candida krusei NOT DETECTED NOT DETECTED Final   Candida parapsilosis NOT DETECTED NOT DETECTED Final   Candida tropicalis NOT DETECTED NOT DETECTED Final   Cryptococcus neoformans/gattii NOT DETECTED NOT DETECTED Final   CTX-M ESBL NOT DETECTED NOT DETECTED Final   Carbapenem resistance IMP NOT DETECTED NOT DETECTED Final   Carbapenem resistance KPC NOT DETECTED NOT DETECTED Final   Carbapenem  resistance NDM NOT DETECTED NOT DETECTED Final   Carbapenem resist OXA 48 LIKE NOT DETECTED NOT DETECTED Final   Carbapenem resistance VIM NOT DETECTED NOT DETECTED Final    Comment: Performed at [facility], [address] [place], [place] [address]  Blood culture (routine x 2)     Status: Abnormal (Preliminary result)   Collection Time: 04/27/23  5:42 PM   Specimen: BLOOD  Result Value Ref Range Status   Specimen Description BLOOD SITE NOT SPECIFIED  Final   Special Requests   Final    BOTTLES DRAWN AEROBIC AND ANAEROBIC Blood Culture adequate volume   Culture  Setup Time   Final    GRAM NEGATIVE RODS AEROBIC BOTTLE ONLY CRITICAL RESULT CALLED TO, READ  BACK BY AND VERIFIED WITH: Wynn Banker [id number] @ 2218 FH  Performed at [facility], [address] [place], [place] [address]    Culture KLEBSIELLA PNEUMONIAE (A)  Final   Report Status PENDING  Incomplete  MRSA Next Gen by PCR, Nasal     Status: None   Collection Time: 04/28/23  1:23 AM   Specimen: Nasal Mucosa; Nasal Swab  Result Value Ref Range Status   MRSA by PCR Next Gen NOT DETECTED NOT DETECTED Final    Comment: (NOTE) The GeneXpert MRSA Assay (FDA approved for NASAL specimens only), is one component of a comprehensive MRSA colonization surveillance program. It is not intended to diagnose MRSA infection nor to guide or monitor treatment for MRSA infections. Test performance is not FDA approved in patients less than 28 years old. Performed at [facility], [address], [place], [place] [address]     Radiology Reports CT LUMBAR SPINE WO CONTRAST  Result Date: 04/28/2023 CLINICAL DATA:  Low back pain.  Compression fractures. EXAM: CT LUMBAR SPINE WITHOUT CONTRAST TECHNIQUE: Multidetector CT imaging of the lumbar spine was performed without intravenous contrast administration. Multiplanar CT image reconstructions were also generated. RADIATION DOSE REDUCTION: This exam was performed according to  the departmental dose-optimization program which includes automated exposure control, adjustment of the mA and/or kV according to patient size and/or use of iterative reconstruction technique. COMPARISON:  CT abdomen and pelvis 01/19/2023 FINDINGS: Segmentation: 5 lumbar type vertebrae. Alignment: Mild lumbar levoscoliosis. Unchanged trace anterolisthesis of L5 on S1. Vertebrae: Unchanged chronic compression fractures with moderate vertebral body height loss at L1 and L3 and mild height loss at L2 and L4. Diffuse osteopenia. No acute fracture or suspicious osseous lesion. Paraspinal and other soft tissues: Aortic atherosclerosis. Disc levels: Asymmetric disc space narrowing on the right at L2-3 and on the left at L4-5. Moderate to severe multilevel facet hypertrophy. Moderate spinal stenosis at L3-4 due to disc bulging and facet and ligamentum flavum hypertrophy. Mild-to-moderate multilevel neural foraminal stenosis. IMPRESSION: 1. Multiple chronic lumbar compression fractures without an acute fracture identified. 2. Moderate spinal stenosis at L3-4. 3.  Aortic Atherosclerosis (ICD10-I70.0). Electronically Signed   By: Sebastian Ache M.D.   On: 04/28/2023 14:14   CT THORACIC SPINE WO CONTRAST  Result Date: 04/28/2023 CLINICAL DATA:  Thoracic compression fractures.  Back pain. EXAM: CT THORACIC SPINE WITHOUT CONTRAST TECHNIQUE: Multidetector CT images of the thoracic were obtained using the standard protocol without intravenous contrast. RADIATION DOSE REDUCTION: This exam was performed according to the departmental dose-optimization program which includes automated exposure control, adjustment of the mA and/or kV according to patient size and/or use of iterative reconstruction technique. COMPARISON:  CTA chest 04/19/2023 and 01/05/2023 FINDINGS: Alignment: Exaggerated thoracic kyphosis. Mild upper thoracic levoscoliosis. No significant listhesis. Vertebrae: Chronic T1-T3 and T7-T12 compression fractures, all  unchanged from 01/05/2023 with severe height loss noted at T11 and moderate height loss at T7, T8, and T10. No acute fracture or suspicious osseous lesion. Paraspinal and other soft tissues: Small bilateral pleural effusions, increased in size on the right since 04/19/2023. Bilateral lower lobe atelectasis and upper lobe scarring. Disc levels: Predominantly mild for age disc degeneration and facet arthrosis without evidence of high-grade spinal stenosis. IMPRESSION: 1. Numerous chronic thoracic compression fractures without an acute fracture. 2. Mild disc and facet degeneration without evidence of high-grade spinal stenosis. 3. Small bilateral pleural effusions. Electronically Signed   By: Sebastian Ache M.D.   On: 04/28/2023 13:48  DG Chest Port 1 View  Result Date: 04/28/2023 CLINICAL DATA:  Shortness of breath. EXAM: PORTABLE CHEST 1 VIEW COMPARISON:  Chest radiograph 04/27/2023 and chest CTA 04/19/2023 FINDINGS: The patient remains rotated to the left with unchanged cardiomediastinal silhouette. Left lung base opacity is similar to the prior radiograph and likely reflects a combination of the previously shown hiatal hernia, a persistent small left pleural effusion, and left basilar atelectasis or consolidation. Interstitial type densities in the left upper lung in the right lung base have increased. No pneumothorax is identified. IMPRESSION: 1. Unchanged small left pleural effusion and left basilar atelectasis or consolidation. 2. Increased interstitial densities in the left upper lung and right lung base which may reflect edema or infection. Electronically Signed   By: Sebastian Ache M.D.   On: 04/28/2023 07:57   DG Chest 1 View  Result Date: 04/27/2023 CLINICAL DATA:  Pain. EXAM: CHEST  1 VIEW COMPARISON:  Radiograph 04/23/2023, CT 04/19/2023 FINDINGS: Significant patient rotation. Heart is grossly normal in size. Left lung base opacity likely in part related to retrocardiac hiatal hernia. Small left  pleural effusion is similar. The right lung is clear. No pneumothorax. The bones are diffusely under mineralized. IMPRESSION: Left lung base opacity likely in part related to retrocardiac hiatal hernia. Small left pleural effusion is similar. Electronically Signed   By: Narda Rutherford M.D.   On: 04/27/2023 15:09      Signature  -   Huey Bienenstock M.D on 04/29/2023 at 1:52 PM   -  To page go to [email or web address]

## 2023-04-30 ENCOUNTER — Inpatient Hospital Stay (HOSPITAL_COMMUNITY): Payer: Medicare Other

## 2023-04-30 DIAGNOSIS — J189 Pneumonia, unspecified organism: Secondary | ICD-10-CM

## 2023-04-30 DIAGNOSIS — J9601 Acute respiratory failure with hypoxia: Secondary | ICD-10-CM | POA: Diagnosis not present

## 2023-04-30 MED ORDER — MAGNESIUM CITRATE PO SOLN: 0.5000 | Freq: Once | ORAL | Status: DC

## 2023-04-30 MED ORDER — MAGNESIUM CITRATE PO SOLN
0.5000 | Freq: Two times a day (BID) | ORAL | Status: DC | PRN
  Administered 2023-04-30: 0.5 via ORAL
  Filled 2023-04-30: qty 296

## 2023-04-30 MED ORDER — POTASSIUM CHLORIDE 20 MEQ PO PACK
40.0000 meq | PACK | Freq: Once | ORAL | Status: AC
  Administered 2023-04-30: 40 meq via ORAL
  Filled 2023-04-30: qty 2

## 2023-04-30 MED ORDER — FUROSEMIDE 20 MG PO TABS
20.0000 mg | ORAL_TABLET | Freq: Every day | ORAL | Status: DC
  Administered 2023-04-30 – 2023-05-01 (×2): 20 mg via ORAL
  Filled 2023-04-30 (×2): qty 1

## 2023-04-30 NOTE — Plan of Care (Signed)
  Problem: Fluid Volume: Goal: Hemodynamic stability will improve Outcome: Progressing   

## 2023-04-30 NOTE — Progress Notes (Signed)
Physical Therapy Treatment Patient Details Name: Gina Mcgee MRN: [id number] DOB: Dec 25, 1929 Today's Date: 04/30/2023   History of Present Illness 89 y.o. female presents to [facility] hospital on 04/27/2023 with AMS, lethargy, hypoxia. Pt recently admitted to [facility] from 7/28-8/3 with COVID. Pt with concern for PNA. Imaging of spine on 8/6 identifies multiple chronic compression fxs but no acute findings. PMH includes HTN, GERD, afib, anemia, bladder and breast CA, MI, UTIs.    PT Comments  Patient very interested in mobility and wanted to try to stand, however she fatigues quickly and was unable to attempt once RW was set for her. She sat EOB ~8 minutes with extremely forward flexed/kyphotic posture with reports of back pain whenever attempting to help her move towards upright. Lengthy discussion with daughter re: options for continued therapy upon discharge and she plans to take pt home with HHPT. Discussed equipment she needs (see below) and that pt will need medical transport as she cannot currently transfer to a wheelchair or into a car (even with +2 assist).     If plan is discharge home, recommend the following: Assistance with cooking/housework;Assist for transportation;Help with stairs or ramp for entrance;Two people to help with walking and/or transfers;Two people to help with bathing/dressing/bathroom   Can travel by private vehicle        Equipment Recommendations  Hospital bed;Other (comment) (hoyer lift with toileting pad)    Recommendations for Other Services       Precautions / Restrictions Precautions Precautions: Fall Precaution Comments: monitor O2; no functional use of L hand Restrictions Weight Bearing Restrictions: No     Mobility  Bed Mobility Overal bed mobility: Needs Assistance Bed Mobility: Rolling, Sidelying to Sit, Sit to Sidelying Rolling: Mod assist Sidelying to sit: Max assist     Sit to sidelying: Max assist, +2 for physical assistance General bed  mobility comments: pt with back pain with movement; unable to attempt lifting her legs back up onto the bed due to severe back pain    Transfers                   General transfer comment: pt wanted to try, by the time RW was in front of her, she was too tired and hurting too much    Ambulation/Gait                   Stairs             Wheelchair Mobility     Tilt Bed    Modified Rankin (Stroke Patients Only)       Balance Overall balance assessment: Needs assistance Sitting-balance support: Single extremity supported, Feet supported Sitting balance-Leahy Scale: Poor Sitting balance - Comments: maxA to maintain a more upright posture otherwise pt fatigues quickly and collapses into flexion with head near knees                                    Cognition Arousal: Alert Behavior During Therapy: WFL for tasks assessed/performed Overall Cognitive Status: History of cognitive impairments - at baseline                                 General Comments: alert and Follows commands well        Exercises General Exercises - Lower Extremity Ankle Circles/Pumps: AROM, Both, 5 reps  Long Arc Quad: AROM, Both, Seated    General Comments General comments (skin integrity, edema, etc.): Daughter present and reports she will take pt home even if she is bed bound. She requested a hoyer lift and hospital bed. She agrees pt will need medical transport home.      Pertinent Vitals/Pain Pain Assessment Pain Assessment: Faces Faces Pain Scale: Hurts even more Pain Location: back Pain Descriptors / Indicators: Aching, Grimacing, Guarding, Moaning Pain Intervention(s): Limited activity within patient's tolerance, Monitored during session, Repositioned    Home Living                          Prior Function            PT Goals (current goals can now be found in the care plan section) Acute Rehab PT Goals Patient Stated  Goal: to return home PT Goal Formulation: With patient/family Time For Goal Achievement: 05/12/23 Potential to Achieve Goals: Fair Progress towards PT goals: Not progressing toward goals - comment    Frequency    Min 1X/week      PT Plan      Co-evaluation PT/OT/SLP Co-Evaluation/Treatment: Yes Reason for Co-Treatment: Other (comment) (pt fatigue level/tolerance) PT goals addressed during session: Mobility/safety with mobility;Balance OT goals addressed during session: ADL's and self-care;Proper use of Adaptive equipment and DME      AM-PAC PT "6 Clicks" Mobility   Outcome Measure  Help needed turning from your back to your side while in a flat bed without using bedrails?: A Lot Help needed moving from lying on your back to sitting on the side of a flat bed without using bedrails?: A Lot Help needed moving to and from a bed to a chair (including a wheelchair)?: Total Help needed standing up from a chair using your arms (e.g., wheelchair or bedside chair)?: Total Help needed to walk in hospital room?: Total Help needed climbing 3-5 steps with a railing? : Total 6 Click Score: 8    End of Session Equipment Utilized During Treatment: Oxygen Activity Tolerance: Patient limited by fatigue;Patient limited by pain Patient left: in bed;with call bell/phone within reach;with family/visitor present Nurse Communication: Mobility status;Need for lift equipment PT Visit Diagnosis: Other abnormalities of gait and mobility (R26.89);Muscle weakness (generalized) (M62.81)     Time: 4098-1191 PT Time Calculation (min) (ACUTE ONLY): 35 min  Charges:    $Therapeutic Activity: 8-22 mins PT General Charges $$ ACUTE PT VISIT: 1 Visit                      Jerolyn Center, PT Acute Rehabilitation Services  Office [phone]    Zena Amos 04/30/2023, 4:37 PM

## 2023-04-30 NOTE — Progress Notes (Signed)
PROGRESS NOTE                                                                                                                                                                                                             Patient Demographics:    Gina Mcgee, is a 89 y.o. female, DOB - Sep 19, 1930, ZOX:[id number]  Outpatient Primary MD for the patient is [name], MD    LOS - 2  Admit date - 04/27/2023    Chief Complaint  Patient presents with   Failure To Thrive       Brief Narrative (HPI from H&P)   89 y.o. female with medical history significant of hypertension, GERD, A-fib, anemia, bladder cancer, breast cancer, myocardial impairment, UTIs, bacteremia presenting with altered mental status, lethargy, hypoxia.  Diagnosed with pneumonia, she is also been having low back pain for the last several weeks.  Admitted for pneumonia treatment.  Of note she was recently admitted to [facility] and treated for COVID-pneumonia.   Subjective:    Gina Mcgee today to report generalized weakness, fatigue, she reports constipation no BM for few days.  For generalized weakness, fatigue, congestion and cough, she does report mild dyspnea as well    Assessment  & Plan :   Acute hypoxic respiratory failure due to pneumonia.   Recently treated for COVID-19, procalcitonin mildly elevated along with CRP, also has some intermittent aspiration history at home, currently placed on Unasyn, hypoxia improving, add I-S and flutter valve for pulmonary toiletry, speech eval, follow cultures.  Advance activity and titrate down oxygen. -Will start on low-dose scheduled Lasix -White blood cell count and procalcitonin improving.  Ongoing low back pain.   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  Hypertension.  Blood pressure stable placed on beta-blocker and home dose Cardizem.  Paroxysmal A-fib.  CHA2DS2-VASc 2 score of greater than 3.  On beta-blocker, Cardizem and Eliquis.  Mild  cognitive impairment.  On Namenda.  At risk for delirium.  History of bladder and breast cancer.  Supportive care.  Constipation -Started on magnesium citrate      Condition - Extremely Guarded  Family Communication  :  daughter bedside 04/28/23. 8/7, by phone 8/8  Code Status :  DNR  Consults  :  None  PUD Prophylaxis :    Procedures  :     CT T & L Spine -       Disposition Plan  :    DVT Prophylaxis  :    apixaban (ELIQUIS) tablet 2.5 mg Start: 04/27/23 2200 apixaban (ELIQUIS) tablet 2.5 mg     Lab Results  Component Value Date   PLT 438 (H) 04/30/2023    Diet :  Diet Order             Diet regular Room service appropriate? Yes; Fluid consistency: Thin  Diet effective now                    Inpatient Medications  Scheduled Meds:  acetaminophen  500 mg Oral TID   apixaban  2.5 mg Oral BID   calcium-vitamin D  2 tablet Oral BID   [START ON 05/10/2023] cyanocobalamin  1,000 mcg Intramuscular Q30 days   diltiazem  60 mg Oral Q8H   feeding supplement  237 mL Oral TID BM   furosemide  20 mg Oral Daily   memantine  10 mg Oral BID   metoprolol succinate  25 mg Oral Daily   senna-docusate  1 tablet Oral BID   sodium chloride flush  3 mL Intravenous Q12H   Continuous Infusions:  ampicillin-sulbactam (UNASYN) IV 3 g (04/30/23 1021)   PRN Meds:.acetaminophen **OR** acetaminophen, magnesium citrate, polyethylene glycol    Objective:    Vitals:   04/30/23 0501 04/30/23 0800 04/30/23 0920 04/30/23 1200  BP: 133/60  (!) 120/54 (!) 137/93  Pulse:   87 89  Resp:   (!) 24 (!) 22  Temp:   (!) 97.4 F (36.3 C)   TempSrc:  Oral Oral   SpO2:   95% 96%  Weight:      Height:        Wt Readings from Last 3 Encounters:  04/27/23 50.8 kg  04/19/23 50.8 kg  01/30/23 54.4 kg     Intake/Output Summary (Last 24 hours) at 04/30/2023 1340 Last data filed at 04/30/2023 1300 Gross per 24 hour  Intake 1840 ml  Output 1350 ml  Net 490 ml     Physical Exam  Awake Alert, Oriented X 2, extremely frail, deconditioned Symmetrical Chest wall movement, diminished air entry at the bases RRR,No Gallops,Rubs or new Murmurs, No Parasternal Heave +ve B.Sounds, Abd Soft, No tenderness, No rebound - guarding or rigidity. No Cyanosis, Clubbing or edema, No new Rash or bruise           Data Review:    Recent Labs  Lab 04/27/23 1405 04/27/23 1511 04/28/23 0659 04/28/23 1028 04/29/23 0035 04/30/23 0823  WBC 20.2*  --  18.4* 12.6* 12.3* 14.7*  HGB 11.5* 11.6* 7.7* 9.5* 9.3* 11.2*  HCT 36.3 34.0* 24.5* 29.4* 29.9* 35.5*  PLT 387  --  405* 339 359 438*  MCV 89.9  --  88.8 87.8 87.4 88.8  MCH 28.5  --  27.9 28.4 27.2 28.0  MCHC 31.7  --  31.4 32.3 31.1 31.5  RDW 15.7*  --  15.8* 15.6* 15.7* 15.6*  LYMPHSABS 0.5*  --  1.4 0.8 1.1 1.1  MONOABS 0.5  --  1.0 0.6 0.6 0.6  EOSABS 0.1  --  0.1 0.1 0.1 0.1  BASOSABS 0.1  --  0.1 0.1 0.1 0.1    Recent Labs  Lab 04/24/23 0413 04/27/23 1405 04/27/23 1511 04/27/23 1803 04/27/23 2115 04/28/23 0659 04/29/23 0035 04/30/23 0823  NA 129* 131* 130*  --   --  131* 130* 130*  K 3.8 3.9 4.6  --   --  3.8 3.6 4.8  CL 94* 90*  --   --   --  94* 94* 95*  CO2 24 24  --   --   --  26 26 25   ANIONGAP 11 17*  --   --   --  11 10 10   GLUCOSE 118* 106*  --   --   --  95 104* 125*  BUN 28* 20  --   --   --  16 13 14   CREATININE 1.07* 1.05*  --   --   --  0.82 0.77 0.75  AST  --  45*  --   --    --  32 30 38  ALT  --  41  --   --   --  32 30 37  ALKPHOS  --  135*  --   --   --  120 107 117  BILITOT  --  0.7  --   --   --  0.5 0.3 0.5  ALBUMIN  --  2.5*  --   --   --  2.2* 1.9* 2.4*  CRP  --   --   --   --   --  19.2* 18.2* 16.6*  PROCALCITON  --   --   --   --   --  4.63 3.36 1.61  LATICACIDVEN  --   --   --  1.5 2.0*  --   --   --   INR  --   --   --   --   --  1.3*  --   --   TSH  --   --   --   --   --  4.638*  --   --   BNP  --   --   --   --   --  352.9* 322.2* 307.3*  MG 2.3  --   --   --   --  1.8 1.8 1.9  CALCIUM 8.5* 8.5*  --   --   --  8.5* 8.4* 8.9      Recent Labs  Lab 04/24/23 0413 04/27/23 1405 04/27/23 1803 04/27/23 2115 04/28/23 0659 04/29/23 0035 04/30/23 0823  CRP  --   --   --   --  19.2* 18.2* 16.6*  PROCALCITON  --   --   --   --  4.63 3.36 1.61  LATICACIDVEN  --   --  1.5 2.0*  --   --   --   INR  --   --   --   --  1.3*  --   --   TSH  --   --   --   --  4.638*  --   --   BNP  --   --   --   --  352.9* 322.2* 307.3*  MG 2.3  --   --   --  1.8 1.8 1.9  CALCIUM 8.5* 8.5*  --   --  8.5* 8.4* 8.9  Micro Results Recent Results (from the past 240 hour(s))  Blood culture (routine x 2)     Status: Abnormal   Collection Time: 04/27/23  4:57 PM   Specimen: BLOOD  Result Value Ref Range Status   Specimen Description BLOOD SITE NOT SPECIFIED  Final   Special Requests   Final    BOTTLES DRAWN AEROBIC AND ANAEROBIC Blood Culture adequate volume   Culture  Setup Time   Final    GRAM NEGATIVE RODS AEROBIC BOTTLE ONLY CRITICAL RESULT CALLED TO, READ BACK BY AND VERIFIED WITH: PHARMD S.NUDEY AT 1144 ON 04/28/2023 BY T.SAAD. Performed at [facility], [address], [place], [place] [address]    Culture KLEBSIELLA PNEUMONIAE (A)  Final   Report Status 04/30/2023 FINAL  Final   Organism ID, Bacteria KLEBSIELLA PNEUMONIAE  Final      Susceptibility   Klebsiella pneumoniae - MIC*    AMPICILLIN RESISTANT Resistant     CEFEPIME <=0.12  SENSITIVE Sensitive     CEFTAZIDIME <=1 SENSITIVE Sensitive     CEFTRIAXONE <=0.25 SENSITIVE Sensitive     CIPROFLOXACIN <=0.25 SENSITIVE Sensitive     GENTAMICIN <=1 SENSITIVE Sensitive     IMIPENEM <=0.25 SENSITIVE Sensitive     TRIMETH/SULFA <=20 SENSITIVE Sensitive     AMPICILLIN/SULBACTAM 4 SENSITIVE Sensitive     PIP/TAZO <=4 SENSITIVE Sensitive     * KLEBSIELLA PNEUMONIAE  Blood Culture ID Panel (Reflexed)     Status: Abnormal   Collection Time: 04/27/23  4:57 PM  Result Value Ref Range Status   Enterococcus faecalis NOT DETECTED NOT DETECTED Final   Enterococcus Faecium NOT DETECTED NOT DETECTED Final   Listeria monocytogenes NOT DETECTED NOT DETECTED Final   Staphylococcus species NOT DETECTED NOT DETECTED Final   Staphylococcus aureus (BCID) NOT DETECTED NOT DETECTED Final   Staphylococcus epidermidis NOT DETECTED NOT DETECTED Final   Staphylococcus lugdunensis NOT DETECTED NOT DETECTED Final   Streptococcus species NOT DETECTED NOT DETECTED Final   Streptococcus agalactiae NOT DETECTED NOT DETECTED Final   Streptococcus pneumoniae NOT DETECTED NOT DETECTED Final   Streptococcus pyogenes NOT DETECTED NOT DETECTED Final   A.calcoaceticus-baumannii NOT DETECTED NOT DETECTED Final   Bacteroides fragilis NOT DETECTED NOT DETECTED Final   Enterobacterales DETECTED (A) NOT DETECTED Final    Comment: Enterobacterales represent a large order of gram negative bacteria, not a single organism. CRITICAL RESULT CALLED TO, READ BACK BY AND VERIFIED WITH: PHARMD S.NUDEY AT 1144 ON 04/28/2023 BY T.SAAD.    Enterobacter cloacae complex NOT DETECTED NOT DETECTED Final   Escherichia coli NOT DETECTED NOT DETECTED Final   Klebsiella aerogenes NOT DETECTED NOT DETECTED Final   Klebsiella oxytoca NOT DETECTED NOT DETECTED Final   Klebsiella pneumoniae DETECTED (A) NOT DETECTED Final    Comment: CRITICAL RESULT CALLED TO, READ BACK BY AND VERIFIED WITH: PHARMD S.NUDEY AT 1144 ON 04/28/2023 BY  T.SAAD.    Proteus species NOT DETECTED NOT DETECTED Final   Salmonella species NOT DETECTED NOT DETECTED Final   Serratia marcescens NOT DETECTED NOT DETECTED Final   Haemophilus influenzae NOT DETECTED NOT DETECTED Final   Neisseria meningitidis NOT DETECTED NOT DETECTED Final   Pseudomonas aeruginosa NOT DETECTED NOT DETECTED Final   Stenotrophomonas maltophilia NOT DETECTED NOT DETECTED Final   Candida albicans NOT DETECTED NOT DETECTED Final   Candida auris NOT DETECTED NOT DETECTED Final   Candida glabrata NOT DETECTED NOT DETECTED Final   Candida krusei NOT DETECTED NOT DETECTED Final   Candida  parapsilosis NOT DETECTED NOT DETECTED Final   Candida tropicalis NOT DETECTED NOT DETECTED Final   Cryptococcus neoformans/gattii NOT DETECTED NOT DETECTED Final   CTX-M ESBL NOT DETECTED NOT DETECTED Final   Carbapenem resistance IMP NOT DETECTED NOT DETECTED Final   Carbapenem resistance KPC NOT DETECTED NOT DETECTED Final   Carbapenem resistance NDM NOT DETECTED NOT DETECTED Final   Carbapenem resist OXA 48 LIKE NOT DETECTED NOT DETECTED Final   Carbapenem resistance VIM NOT DETECTED NOT DETECTED Final    Comment: Performed at [facility], [address] [place], [place] [address]  Blood culture (routine x 2)     Status: Abnormal   Collection Time: 04/27/23  5:42 PM   Specimen: BLOOD  Result Value Ref Range Status   Specimen Description BLOOD SITE NOT SPECIFIED  Final   Special Requests   Final    BOTTLES DRAWN AEROBIC AND ANAEROBIC Blood Culture adequate volume   Culture  Setup Time   Final    GRAM NEGATIVE RODS AEROBIC BOTTLE ONLY CRITICAL RESULT CALLED TO, READ BACK BY AND VERIFIED WITH: PHARMD K. AHMEND [id number] @ 2218 FH     Culture (A)  Final    KLEBSIELLA PNEUMONIAE SUSCEPTIBILITIES PERFORMED ON PREVIOUS CULTURE WITHIN THE LAST 5 DAYS. Performed at [facility], [address], [place], [place] [address]    Report Status 04/30/2023 FINAL  Final  MRSA Next  Gen by PCR, Nasal     Status: None   Collection Time: 04/28/23  1:23 AM   Specimen: Nasal Mucosa; Nasal Swab  Result Value Ref Range Status   MRSA by PCR Next Gen NOT DETECTED NOT DETECTED Final    Comment: (NOTE) The GeneXpert MRSA Assay (FDA approved for NASAL specimens only), is one component of a comprehensive MRSA colonization surveillance program. It is not intended to diagnose MRSA infection nor to guide or monitor treatment for MRSA infections. Test performance is not FDA approved in patients less than 63 years old. Performed at [facility], [address] [place], [place] [address]     Radiology Reports DG Abd Portable 1V  Result Date: 04/30/2023 CLINICAL DATA:  Abdominal pain EXAM: PORTABLE ABDOMEN - 1 VIEW COMPARISON:  CT abdomen and pelvis dated 01/19/2023 FINDINGS: Nonobstructive bowel gas pattern. No free air or pneumatosis. Moderate volume stool throughout the colon. No abnormal radio-opaque calculi or mass effect. Partially imaged left proximal femoral fixation. IMPRESSION: Nonobstructive bowel gas pattern. Moderate volume stool throughout the colon. Electronically Signed   By: Agustin Cree M.D.   On: 04/30/2023 09:18   CT LUMBAR SPINE WO CONTRAST  Result Date: 04/28/2023 CLINICAL DATA:  Low back pain.  Compression fractures. EXAM: CT LUMBAR SPINE WITHOUT CONTRAST TECHNIQUE: Multidetector CT imaging of the lumbar spine was performed without intravenous contrast administration. Multiplanar CT image reconstructions were also generated. RADIATION DOSE REDUCTION: This exam was performed according to the departmental dose-optimization program which includes automated exposure control, adjustment of the mA and/or kV according to patient size and/or use of iterative reconstruction technique. COMPARISON:  CT abdomen and pelvis 01/19/2023 FINDINGS: Segmentation: 5 lumbar type vertebrae. Alignment: Mild lumbar levoscoliosis. Unchanged trace anterolisthesis of L5 on S1. Vertebrae:  Unchanged chronic compression fractures with moderate vertebral body height loss at L1 and L3 and mild height loss at L2 and L4. Diffuse osteopenia. No acute fracture or suspicious osseous lesion. Paraspinal and other soft tissues: Aortic atherosclerosis. Disc levels: Asymmetric disc space narrowing on the right at L2-3 and  on the left at L4-5. Moderate to severe multilevel facet hypertrophy. Moderate spinal stenosis at L3-4 due to disc bulging and facet and ligamentum flavum hypertrophy. Mild-to-moderate multilevel neural foraminal stenosis. IMPRESSION: 1. Multiple chronic lumbar compression fractures without an acute fracture identified. 2. Moderate spinal stenosis at L3-4. 3.  Aortic Atherosclerosis (ICD10-I70.0). Electronically Signed   By: Sebastian Ache M.D.   On: 04/28/2023 14:14   CT THORACIC SPINE WO CONTRAST  Result Date: 04/28/2023 CLINICAL DATA:  Thoracic compression fractures.  Back pain. EXAM: CT THORACIC SPINE WITHOUT CONTRAST TECHNIQUE: Multidetector CT images of the thoracic were obtained using the standard protocol without intravenous contrast. RADIATION DOSE REDUCTION: This exam was performed according to the departmental dose-optimization program which includes automated exposure control, adjustment of the mA and/or kV according to patient size and/or use of iterative reconstruction technique. COMPARISON:  CTA chest 04/19/2023 and 01/05/2023 FINDINGS: Alignment: Exaggerated thoracic kyphosis. Mild upper thoracic levoscoliosis. No significant listhesis. Vertebrae: Chronic T1-T3 and T7-T12 compression fractures, all unchanged from 01/05/2023 with severe height loss noted at T11 and moderate height loss at T7, T8, and T10. No acute fracture or suspicious osseous lesion. Paraspinal and other soft tissues: Small bilateral pleural effusions, increased in size on the right since 04/19/2023. Bilateral lower lobe atelectasis and upper lobe scarring. Disc levels: Predominantly mild for age disc  degeneration and facet arthrosis without evidence of high-grade spinal stenosis. IMPRESSION: 1. Numerous chronic thoracic compression fractures without an acute fracture. 2. Mild disc and facet degeneration without evidence of high-grade spinal stenosis. 3. Small bilateral pleural effusions. Electronically Signed   By: Sebastian Ache M.D.   On: 04/28/2023 13:48   DG Chest Port 1 View  Result Date: 04/28/2023 CLINICAL DATA:  Shortness of breath. EXAM: PORTABLE CHEST 1 VIEW COMPARISON:  Chest radiograph 04/27/2023 and chest CTA 04/19/2023 FINDINGS: The patient remains rotated to the left with unchanged cardiomediastinal silhouette. Left lung base opacity is similar to the prior radiograph and likely reflects a combination of the previously shown hiatal hernia, a persistent small left pleural effusion, and left basilar atelectasis or consolidation. Interstitial type densities in the left upper lung in the right lung base have increased. No pneumothorax is identified. IMPRESSION: 1. Unchanged small left pleural effusion and left basilar atelectasis or consolidation. 2. Increased interstitial densities in the left upper lung and right lung base which may reflect edema or infection. Electronically Signed   By: Sebastian Ache M.D.   On: 04/28/2023 07:57   DG Chest 1 View  Result Date: 04/27/2023 CLINICAL DATA:  Pain. EXAM: CHEST  1 VIEW COMPARISON:  Radiograph 04/23/2023, CT 04/19/2023 FINDINGS: Significant patient rotation. Heart is grossly normal in size. Left lung base opacity likely in part related to retrocardiac hiatal hernia. Small left pleural effusion is similar. The right lung is clear. No pneumothorax. The bones are diffusely under mineralized. IMPRESSION: Left lung base opacity likely in part related to retrocardiac hiatal hernia. Small left pleural effusion is similar. Electronically Signed   By: Narda Rutherford M.D.   On: 04/27/2023 15:09      Signature  -   Mliss Fritz  M.D on 04/30/2023 at 1:40 PM    -  To page go to [email or web address]

## 2023-04-30 NOTE — TOC Initial Note (Signed)
Transition of Care [facility]) - Initial/Assessment Note    Patient Details  Name: Gina Mcgee MRN: [id number] Date of Birth: [birth year]  Transition of Care [facility]) CM/SW Contact:    Mearl Latin, LCSW Phone Number: 04/30/2023, 10:36 AM  Clinical Narrative:                 Patient admitted from home with recent hospital discharge where she received home oxygen and DME from [facility] and was set up with [facility] services. Will continue to follow.   Expected Discharge Plan: Home w Home Health Services Barriers to Discharge: Continued Medical Work up   Patient Goals and CMS Choice            Expected Discharge Plan and Services   Discharge Planning Services: CM Consult Post Acute Care Choice: Home Health Living arrangements for the past 2 months: Single Family Home                                      Prior Living Arrangements/Services Living arrangements for the past 2 months: Single Family Home   Patient language and need for interpreter reviewed:: Yes        Need for Family Participation in Patient Care: Yes (Comment) Care giver support system in place?: Yes (comment) Current home services: DME (w/c, walker, cane, O2) Criminal Activity/Legal Involvement Pertinent to Current Situation/Hospitalization: No - Comment as needed  Activities of Daily Living Home Assistive Devices/Equipment: Eyeglasses, Wheelchair, Medical laboratory scientific officer (specify quad or straight) ADL Screening (condition at time of admission) Patient's cognitive ability adequate to safely complete daily activities?: No Is the patient deaf or have difficulty hearing?: Yes Does the patient have difficulty seeing, even when wearing glasses/contacts?: No Does the patient have difficulty concentrating, remembering, or making decisions?: Yes Patient able to express need for assistance with ADLs?: Yes Does the patient have difficulty dressing or bathing?: Yes Independently performs ADLs?: No Communication: Needs  assistance Is this a change from baseline?: Pre-admission baseline Dressing (OT): Needs assistance Is this a change from baseline?: Pre-admission baseline Grooming: Needs assistance Is this a change from baseline?: Pre-admission baseline Feeding: Independent (independent after set up) Bathing: Needs assistance Is this a change from baseline?: Pre-admission baseline Toileting: Needs assistance Is this a change from baseline?: Pre-admission baseline In/Out Bed: Needs assistance Is this a change from baseline?: Pre-admission baseline Walks in Home: Needs assistance Is this a change from baseline?: Pre-admission baseline Does the patient have difficulty walking or climbing stairs?: Yes Weakness of Legs: Both Weakness of Arms/Hands: None  Permission Sought/Granted         Permission granted to share info w AGENCY: HH        Emotional Assessment   Attitude/Demeanor/Rapport: Unable to Assess Affect (typically observed): Unable to Assess Orientation: : Oriented to Self, Oriented to Place Alcohol / Substance Use: Not Applicable Psych Involvement: No (comment)  Admission diagnosis:  Hypoxia [R09.02] Pleural effusion on left [J90] Sepsis (HCC) [A41.9] Sepsis without acute organ dysfunction, due to unspecified organism [facility]) [A41.9] Patient Active Problem List   Diagnosis Date Noted   Sepsis (HCC) 04/27/2023   Acute hypoxemic respiratory failure (HCC) 04/19/2023   History of bacteremia 12/31/2022   Acute metabolic encephalopathy 12/31/2022   Anemia 12/31/2022   Hypotension 12/31/2022   Weakness 12/30/2022   Bladder cancer (HCC) 08/18/2022   MCI (mild cognitive impairment) 08/18/2022   Atrial fibrillation (HCC) 04/16/2022  Gastroesophageal reflux disease without esophagitis 11/26/2020   Pernicious anemia 11/26/2020   Laryngopharyngeal reflux 11/26/2020   Osteoporosis    Abnormal CXR 01/20/2013   Fracture of pubic ramus (HCC) 11/21/2011   Hip pain, acute 11/20/2011   HTN  (hypertension)    Concussion    History of breast cancer in female    PCP:  [name], MD Pharmacy:   [facility] - [place], [place] - [address] [facility] [place] [address] Phone: [phone] Fax: [phone]  [id number] [place], [place] - [address] [facility] [address] [facility] [address] [place] [address] Phone: [phone] Fax: [phone]     Social Determinants of Health (SDOH) Social History: SDOH Screenings   Food Insecurity: No Food Insecurity (04/28/2023)  Housing: Low Risk  (04/28/2023)  Transportation Needs: No Transportation Needs (04/28/2023)  Utilities: Not At Risk (04/28/2023)  Tobacco Use: Low Risk  (04/27/2023)  Recent Concern: Tobacco Use - Medium Risk (04/07/2023)   Received from [facility]   SDOH Interventions:     Readmission Risk Interventions    04/30/2023   10:35 AM 01/21/2023   11:14 AM 01/02/2023   11:46 AM  Readmission Risk Prevention Plan  Transportation Screening Complete Complete Complete  PCP or Specialist Appt within 3-5 Days  Complete Complete  HRI or Home Care Consult  Complete Complete  Social Work Consult for Recovery Care Planning/Counseling  Complete   Palliative Care Screening  Not Applicable Not Applicable  Medication Review Oceanographer) Complete Complete Complete  PCP or Specialist appointment within 3-5 days of discharge Complete    HRI or Home Care Consult Complete    SW Recovery Care/Counseling Consult Complete    Palliative Care Screening Not Applicable    Skilled Nursing Facility Not Applicable

## 2023-04-30 NOTE — Progress Notes (Signed)
Occupational Therapy Treatment Patient Details Name: Gina Mcgee MRN: [id number] DOB: 14-Dec-1929 Today's Date: 04/30/2023   History of present illness 89 y.o. female presents to [facility] hospital on 04/27/2023 with AMS, lethargy, hypoxia. Pt recently admitted to [facility] from 7/28-8/3 with COVID. Pt with concern for PNA. Imaging of spine on 8/6 identifies multiple chronic compression fxs but no acute findings. PMH includes HTN, GERD, afib, anemia, bladder and breast CA, MI, UTIs.   OT comments  Pt in good spirits, motivated to participate. Pt currently limited with bed mobility, sitting EOB, and standing due to significant low back pain, and generalized weakness. Pt total A at bed level for toileting hygiene today, max A for bed mobility. Attempted to stand at bedside today, unable due to low back pain, Pt requires max support for sitting upright at EOB, falls forward, not able to support head.  Pt daughter states Pt was able to ambulate with RW 3-4 weeks ago. Pt would benefit from continued skilled therapy to maximize progress and participate as able, DC plan still appropriate. Pt would benefit from L resting hand splint, for comfort and for contracture management.       If plan is discharge home, recommend the following:  A lot of help with walking and/or transfers;A lot of help with bathing/dressing/bathroom;Assistance with cooking/housework;Direct supervision/assist for medications management;Assist for transportation;Help with stairs or ramp for entrance   Equipment Recommendations  Other (comment) (hospital bed, hoyer)    Recommendations for Other Services      Precautions / Restrictions Precautions Precautions: Fall Precaution Comments: monitor O2; no functional use of L hand Restrictions Weight Bearing Restrictions: No       Mobility Bed Mobility Overal bed mobility: Needs Assistance Bed Mobility: Rolling, Sidelying to Sit, Sit to Sidelying Rolling: Max assist Sidelying to sit:  Mod assist     Sit to sidelying: Max assist General bed mobility comments: Pt requires mod-max A for supine to sit on EOB. max A for sit to sidelying due to back pain.    Transfers Overall transfer level: Needs assistance Equipment used: Rolling walker (2 wheels)               General transfer comment: not able tos tand due to pain and weakness, did attempt at EOB with RW and x2 assist.     Balance Overall balance assessment: Needs assistance Sitting-balance support: Bilateral upper extremity supported, Feet supported Sitting balance-Leahy Scale: Poor Sitting balance - Comments: max A to maintain upright position, leans forward, not able to support head currently       Standing balance comment: not able to due to back pain and weakness/poor posture                           ADL either performed or assessed with clinical judgement   ADL Overall ADL's : Needs assistance/impaired                     Lower Body Dressing: Total assistance;Bed level       Toileting- Clothing Manipulation and Hygiene: Total assistance;Bed level         General ADL Comments: Pt requires total A at bed level for toileting hygiene, max A for rolling L/R in bed.    Extremity/Trunk Assessment Upper Extremity Assessment Upper Extremity Assessment: LUE deficits/detail LUE Deficits / Details: no functional grip or FM in L hand, family states from prior L elbow fx. B shoulder stiffness.  LUE: Shoulder pain with ROM LUE Sensation: decreased light touch LUE Coordination: decreased fine motor;decreased gross motor            Vision       Perception     Praxis      Cognition Arousal: Alert Behavior During Therapy: WFL for tasks assessed/performed Overall Cognitive Status: History of cognitive impairments - at baseline                                 General Comments: alert and oriented to person, place, year. Follows commands well         Exercises      Shoulder Instructions       General Comments      Pertinent Vitals/ Pain       Pain Assessment Pain Assessment: Faces Faces Pain Scale: Hurts even more Pain Descriptors / Indicators: Aching, Discomfort, Grimacing Pain Intervention(s): Monitored during session  Home Living                                          Prior Functioning/Environment              Frequency  Min 1X/week        Progress Toward Goals  OT Goals(current goals can now be found in the care plan section)  Progress towards OT goals: Progressing toward goals  Acute Rehab OT Goals Patient Stated Goal: to improve strength, decrease pain. OT Goal Formulation: With patient/family Time For Goal Achievement: 05/12/23 Potential to Achieve Goals: Fair ADL Goals Pt Will Perform Grooming: with set-up;sitting Pt Will Perform Upper Body Bathing: with supervision;sitting Pt Will Perform Upper Body Dressing: with min assist;sitting Pt Will Perform Lower Body Dressing: with mod assist;sit to/from stand Pt Will Transfer to Toilet: with min assist;bedside commode Pt Will Perform Toileting - Clothing Manipulation and hygiene: with min assist;sit to/from stand  Plan      Co-evaluation    PT/OT/SLP Co-Evaluation/Treatment: Yes Reason for Co-Treatment: Complexity of the patient's impairments (multi-system involvement)   OT goals addressed during session: ADL's and self-care;Proper use of Adaptive equipment and DME      AM-PAC OT "6 Clicks" Daily Activity     Outcome Measure   Help from another person eating meals?: A Little Help from another person taking care of personal grooming?: A Lot Help from another person toileting, which includes using toliet, bedpan, or urinal?: A Lot Help from another person bathing (including washing, rinsing, drying)?: Total Help from another person to put on and taking off regular upper body clothing?: A Lot Help from another person to put  on and taking off regular lower body clothing?: Total 6 Click Score: 11    End of Session Equipment Utilized During Treatment: Oxygen  OT Visit Diagnosis: Unsteadiness on feet (R26.81);Other abnormalities of gait and mobility (R26.89);Muscle weakness (generalized) (M62.81);Other symptoms and signs involving cognitive function;Pain Pain - part of body:  (back)   Activity Tolerance Patient limited by pain   Patient Left in bed;with call bell/phone within reach;with bed alarm set;with family/visitor present   Nurse Communication Mobility status        Time: 2956-2130 OT Time Calculation (min): 35 min  Charges: OT General Charges $OT Visit: 1 Visit OT Treatments $Self Care/Home Management : 8-22 mins   , OTR/L    R   04/30/2023, 4:30 PM

## 2023-05-01 ENCOUNTER — Other Ambulatory Visit (HOSPITAL_COMMUNITY): Payer: Self-pay

## 2023-05-01 DIAGNOSIS — R0902 Hypoxemia: Secondary | ICD-10-CM | POA: Diagnosis not present

## 2023-05-01 DIAGNOSIS — A419 Sepsis, unspecified organism: Secondary | ICD-10-CM | POA: Diagnosis not present

## 2023-05-01 DIAGNOSIS — R6520 Severe sepsis without septic shock: Secondary | ICD-10-CM | POA: Diagnosis not present

## 2023-05-01 MED ORDER — DILTIAZEM HCL ER COATED BEADS 120 MG PO CP24: 120.0000 mg | ORAL_CAPSULE | Freq: Every evening | ORAL | Status: DC

## 2023-05-01 MED ORDER — ENSURE ENLIVE PO LIQD: 237.0000 mL | Freq: Three times a day (TID) | ORAL | Status: AC

## 2023-05-01 MED ORDER — AMOXICILLIN-POT CLAVULANATE 875-125 MG PO TABS
1.0000 | ORAL_TABLET | Freq: Two times a day (BID) | ORAL | 0 refills | Status: AC
  Filled 2023-05-01: qty 4, 2d supply, fill #0

## 2023-05-01 NOTE — TOC CM/SW Note (Signed)
    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

## 2023-05-01 NOTE — Progress Notes (Signed)
Occupational Therapy Treatment Patient Details Name: Gina Mcgee MRN: [id number] DOB: 04-27-1930 Today's Date: 05/01/2023   History of present illness 89 y.o. female presents to [facility] hospital on 04/27/2023 with AMS, lethargy, hypoxia. Pt recently admitted to [facility] from 7/28-8/3 with COVID. Pt with concern for PNA. Imaging of spine on 8/6 identifies multiple chronic compression fxs but no acute findings. PMH includes HTN, GERD, afib, anemia, bladder and breast CA, MI, UTIs.   OT comments  OT fitted pt with Left resting hand splint and trained pt, son, and caregiver in use and care of splint with son and caregiver verbalizing and demonstrating understanding of all training and with pt reporting splint is comfortable. OT also provided pt with a handout outlining use and care of splint. Pt is making progress toward OT goals and participated well throughout the session. Pt currently largely requiring Mod to Total assist for ADLs, bed mobility during/in preparation for functional tasks, and functional transfers. Pt has a good support system at home. Pt will benefit from continued acute skilled OT services to address deficits outlined below, decrease caregiver burden, and increase safety and independence with functional tasks and management of/training in use of Left resting hand splint as needed. Post acute discharge, pt will benefit from continued skilled OT services in the home.       If plan is discharge home, recommend the following:  A lot of help with walking and/or transfers;A lot of help with bathing/dressing/bathroom;Assistance with cooking/housework;Direct supervision/assist for medications management;Assist for transportation;Help with stairs or ramp for entrance   Equipment Recommendations  Other (comment) (hospital bed, hoyer-style lift)    Recommendations for Other Services      Precautions / Restrictions Precautions Precautions: Fall Precaution Comments: monitor O2; no functional  use of L hand Restrictions Weight Bearing Restrictions: No       Mobility Bed Mobility Overal bed mobility: Needs Assistance             General bed mobility comments: Not addressed this session. Pt currently requiring Mod to Max assist for bed mobility.    Transfers Overall transfer level: Needs assistance                 General transfer comment: Not addressed this session. Pt currently requiring Max to Total assist +2 for transfers.     Balance                                           ADL either performed or assessed with clinical judgement   ADL Overall ADL's : Needs assistance/impaired                                       General ADL Comments: ADLs not addressed this session. Pt currently requiring Mod to Total assist or ADLs/    Extremity/Trunk Assessment Upper Extremity Assessment Upper Extremity Assessment: Generalized weakness;LUE deficits/detail LUE Deficits / Details: no functional grip or FM in L hand, family states from prior L elbow fx. B shoulder stiffness. LUE Sensation: decreased light touch;decreased proprioception LUE Coordination: decreased fine motor;decreased gross motor   Lower Extremity Assessment Lower Extremity Assessment: Defer to PT evaluation        Vision       Perception     Praxis  Cognition Arousal: Alert Behavior During Therapy: WFL for tasks assessed/performed Overall Cognitive Status: History of cognitive impairments - at baseline                                 General Comments: Pleasant throughout session and demonstrates ability to consistantly follow 1-step directions with increased time.        Exercises      Shoulder Instructions       General Comments OT fitted/adjusted L resting hand splint to decrase risk of contracture in digits of L hand and L wrist with pt reporting splint is comfortable. Pt, pt's son, and pt's paid caregiver educated in  doffing/donning slplint, wear schedule, washing/drying hand well prior to donning, and checking skin for signs of redness or irritation with son and caregiver verbalizing and demonstrating understanding through teach back. Pt also provided with written instructions regarding wear and care of Le resting hand splint.    Pertinent Vitals/ Pain       Pain Assessment Pain Assessment: No/denies pain Pain Intervention(s): Monitored during session  Home Living                                          Prior Functioning/Environment              Frequency  Min 1X/week        Progress Toward Goals  OT Goals(current goals can now be found in the care plan section)  Progress towards OT goals: Progressing toward goals  Acute Rehab OT Goals Patient Stated Goal: To return home with assist of family and caregivers  Plan      Co-evaluation                 AM-PAC OT "6 Clicks" Daily Activity     Outcome Measure   Help from another person eating meals?: A Little Help from another person taking care of personal grooming?: A Lot Help from another person toileting, which includes using toliet, bedpan, or urinal?: A Lot Help from another person bathing (including washing, rinsing, drying)?: Total Help from another person to put on and taking off regular upper body clothing?: A Lot Help from another person to put on and taking off regular lower body clothing?: Total 6 Click Score: 11    End of Session Equipment Utilized During Treatment: Other (comment);Oxygen (Left resting hand splint)  OT Visit Diagnosis: Muscle weakness (generalized) (M62.81);Other (comment) (Impaired funcitonal use of Left UE)   Activity Tolerance Patient tolerated treatment well   Patient Left in bed;with call bell/phone within reach;with family/visitor present;with nursing/sitter in room   Nurse Communication Other (comment) (L resting hand splint fitted and pt/family trained in use. Pt  requiring NT assistance at end of session.)        Time: 1025-1103 OT Time Calculation (min): 38 min  Charges: OT General Charges $OT Visit: 1 Visit OT Treatments $Orthotics Fit/Training: 38-52 mins   "Orson Eva., OTR/L, MA Acute Rehab [phone]   Lendon Colonel 05/01/2023, 11:25 AM

## 2023-05-01 NOTE — Discharge Instructions (Signed)
Follow with Primary MD Cari Caraway, MD in 7 days   Get CBC, CMP, checked  by Primary MD next visit.    Activity: As tolerated with Full fall precautions use walker/cane & assistance as needed   Disposition Home    Diet:Regular Diet  On your next visit with your primary care physician please Get Medicines reviewed and adjusted.   Please request your Prim.MD to go over all Hospital Tests and Procedure/Radiological results at the follow up, please get all Hospital records sent to your Prim MD by signing hospital release before you go home.   If you experience worsening of your admission symptoms, develop shortness of breath, life threatening emergency, suicidal or homicidal thoughts you must seek medical attention immediately by calling 911 or calling your MD immediately  if symptoms less severe.  You Must read complete instructions/literature along with all the possible adverse reactions/side effects for all the Medicines you take and that have been prescribed to you. Take any new Medicines after you have completely understood and accpet all the possible adverse reactions/side effects.   Do not drive, operating heavy machinery, perform activities at heights, swimming or participation in water activities or provide baby sitting services if your were admitted for syncope or siezures until you have seen by Primary MD or a Neurologist and advised to do so again.  Do not drive when taking Pain medications.    Do not take more than prescribed Pain, Sleep and Anxiety Medications  Special Instructions: If you have smoked or chewed Tobacco  in the last 2 yrs please stop smoking, stop any regular Alcohol  and or any Recreational drug use.  Wear Seat belts while driving.   Please note  You were cared for by a hospitalist during your hospital stay. If you have any questions about your discharge medications or the care you received while you were in the hospital after you are discharged, you  can call the unit and asked to speak with the hospitalist on call if the hospitalist that took care of you is not available. Once you are discharged, your primary care physician will handle any further medical issues. Please note that NO REFILLS for any discharge medications will be authorized once you are discharged, as it is imperative that you return to your primary care physician (or establish a relationship with a primary care physician if you do not have one) for your aftercare needs so that they can reassess your need for medications and monitor your lab values.

## 2023-05-01 NOTE — Plan of Care (Signed)
  Problem: Fluid Volume: Goal: Hemodynamic stability will improve Outcome: Progressing   Problem: Clinical Measurements: Goal: Diagnostic test results will improve Outcome: Progressing Goal: Signs and symptoms of infection will decrease Outcome: Progressing   Problem: Respiratory: Goal: Ability to maintain adequate ventilation will improve Outcome: Progressing   Problem: Fluid Volume: Goal: Hemodynamic stability will improve Outcome: Progressing   Problem: Clinical Measurements: Goal: Diagnostic test results will improve Outcome: Progressing Goal: Signs and symptoms of infection will decrease Outcome: Progressing   Problem: Respiratory: Goal: Ability to maintain adequate ventilation will improve Outcome: Progressing

## 2023-05-01 NOTE — TOC Transition Note (Addendum)
Transition of Care [facility]) - CM/SW Discharge Note   Patient Details  Name: Gina Mcgee MRN: [id number] Date of Birth: [birth year]  Transition of Care [facility]) CM/SW Contact:  Gordy Clement, RN Phone Number: 05/01/2023, 11:08 AM   Clinical Narrative:     UPDATE 12:05 PM Hospital bed will be coming from [employer] now because [employer] (Patient's original DME company could not have delivered until Saturday 8/10 ) In an  effort not to delay a DC, RNCM reached out to [employer] who stated they can have delivered today . Just received update that it is on their delivery roster for today . Liaison getting CM a more definitve time frame   Patient will DC to home today.  Hospital bed and hoyer lift to be delivered to home THEN [facility] will be called for transport . [facility] will resume services.       Barriers to Discharge: Continued Medical Work up   Patient Goals and CMS Choice      Discharge Placement                         Discharge Plan and Services Additional resources added to the After Visit Summary for     Discharge Planning Services: CM Consult Post Acute Care Choice: Home Health                               Social Determinants of Health (SDOH) Interventions SDOH Screenings   Food Insecurity: No Food Insecurity (04/28/2023)  Housing: Low Risk  (04/28/2023)  Transportation Needs: No Transportation Needs (04/28/2023)  Utilities: Not At Risk (04/28/2023)  Tobacco Use: Low Risk  (04/27/2023)  Recent Concern: Tobacco Use - Medium Risk (04/07/2023)   Received from [facility]     Readmission Risk Interventions    04/30/2023   10:35 AM 01/21/2023   11:14 AM 01/02/2023   11:46 AM  Readmission Risk Prevention Plan  Transportation Screening Complete Complete Complete  PCP or Specialist Appt within 3-5 Days  Complete Complete  HRI or Home Care Consult  Complete Complete  Social Work Consult for Recovery Care Planning/Counseling  Complete   Palliative Care Screening   Not Applicable Not Applicable  Medication Review [employer]) Complete Complete Complete  PCP or Specialist appointment within 3-5 days of discharge Complete    HRI or Home Care Consult Complete    SW Recovery Care/Counseling Consult Complete    Palliative Care Screening Not Applicable    Skilled Nursing Facility Not Applicable

## 2023-05-01 NOTE — Discharge Summary (Signed)
Physician Discharge Summary  Gina Mcgee HQI:[id number] DOB: [birth year] DOA: 04/27/2023  PCP: [name], MD  Admit date: 04/27/2023 Discharge date: 05/01/2023  Admitted From: (Home) Disposition:  (Home )  Recommendations for Outpatient Follow-up:  Follow up with PCP in 1-2 weeks Please obtain BMP/CBC in one week   Home Health: (YES) Equipment/Devices: [facility] bed [facility] lift)  Discharge Condition: (Stable) Diet recommendation: Regular  Brief/Interim Summary:  89 y.o. female with medical history significant of hypertension, GERD, A-fib, anemia, bladder cancer, breast cancer, myocardial impairment, UTIs, bacteremia presenting with altered mental status, lethargy, hypoxia.  Diagnosed with pneumonia, she is also been having low back pain for the last several weeks.  Admitted for pneumonia treatment.  Of note she was recently admitted to [facility] and treated for COVID-pneumonia.   Multifocal pneumonia with hypoxia Recently treated for COVID-19, procalcitonin mildly elevated along with CRP, also has some intermittent aspiration history at home, as well her chest x-ray significant for multifocal pneumonia suspicious for bacterial pneumonia, she was treated with Unasyn during hospital stay, she will be discharged on p.o. Augmentin for another 2 days as an outpatient, she was encouraged to use incentive spirometry and flutter valve.   -White blood cell count and procalcitonin improving. -she remains on 2 L oxygen at time of discharge, which was started during recent discharge   Ongoing low back pain.   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   Hypertension.  Blood pressure stable placed on beta-blocker and home dose Cardizem.  Chronic HFrEF -Her home dose Lasix has been holding admission given sepsis, clean volume status has been repleted, BNP started to trend up, so she was resumed on her home dose Lasix   Paroxysmal A-fib.  CHA2DS2-VASc 2 score of greater than 3.  On beta-blocker, Cardizem and Eliquis.   Mild cognitive impairment.  On Namenda.  At risk for delirium.   History of bladder and breast cancer.  Supportive care.   Constipation -Resolved with magnesium citrate  Discharge Diagnoses:  Principal Problem:   Sepsis (HCC) Active Problems:   HTN (hypertension)   History of breast cancer in female   Atrial fibrillation (HCC)   Gastroesophageal reflux disease without esophagitis   Bladder cancer (HCC)   Anemia   Acute hypoxemic respiratory failure [facility])    Discharge Instructions  Discharge Instructions     Diet - low sodium heart healthy   Complete by: As directed    Discharge instructions   Complete by: As directed    Follow with Primary MD [name], MD in 7 days   Get CBC, CMP, checked  by Primary MD next visit.    Activity: As tolerated with Full fall precautions use walker/cane & assistance as needed   Disposition Home    Diet: Regular Diet   On your next visit with your primary care physician please Get Medicines reviewed and adjusted.   Please request your Prim.MD to go over all Hospital Tests and Procedure/Radiological results at the follow up, please get all Hospital records sent to your Prim MD by signing hospital  release before you go home.   If you experience worsening of your admission symptoms, develop shortness of breath, life threatening emergency, suicidal or homicidal thoughts you must seek medical attention immediately by  calling 911 or calling your MD immediately  if symptoms less severe.  You Must read complete instructions/literature along with all the possible adverse reactions/side effects for all the Medicines you take and that have been prescribed to you. Take any new Medicines after you have completely understood and accpet all the possible adverse reactions/side effects.   Do not drive, operating heavy machinery, perform activities at heights, swimming or participation in water activities or provide baby sitting services if your were admitted for syncope or siezures until you have seen by Primary MD or a Neurologist and advised to do so again.  Do not drive when taking Pain medications.    Do not take more than prescribed Pain, Sleep and Anxiety Medications  Special Instructions: If you have smoked or chewed Tobacco  in the last 2 yrs please stop smoking, stop any regular Alcohol  and or any Recreational drug use.  Wear Seat belts while driving.   Please note  You were cared for by a hospitalist during your hospital stay. If you have any questions about your discharge medications or the care you received while you were in the hospital after you are discharged, you can call the unit and asked to speak with the hospitalist on call if the hospitalist that took care of you is not available. Once you are discharged, your primary care physician will handle any further medical issues. Please note that NO REFILLS for any discharge medications will be authorized once you are discharged, as it is imperative that you return to your primary care physician (or establish a relationship with a primary care physician if you do not have one) for your aftercare needs so that they can reassess your need for medications and monitor your lab values.   Increase activity slowly   Complete by: As directed       Allergies as of 05/01/2023       Reactions   Aspirin Other (See Comments)   Gets jitter with large doses   Cipro  [ciprofloxacin Hcl] Diarrhea   Doxycycline Diarrhea   Fish Allergy Nausea Only, Other (See Comments)   "makes me jumpy and twitchy, but grand children are allergic"    Nsaids    Can take for short periods of time. Naprosyn -feels dazed   Shellfish-derived Products Other (See Comments)   "makes me jumpy and twitchy, but grand children are allergic"   Aloe Rash   Latex Rash   Sulfa Antibiotics Rash        Medication List     TAKE these medications    acetaminophen 500 MG tablet Commonly known as: TYLENOL Take 500 mg by mouth every 6 (six) hours as needed for moderate pain.   amoxicillin-clavulanate 875-125 MG tablet Commonly known as: AUGMENTIN Take 1 tablet by mouth 2 (two) times daily for 2 days.   apixaban 2.5 MG Tabs tablet Commonly known as: Eliquis Take 1 tablet (2.5 mg total) by mouth 2 (two) times daily.   cyanocobalamin 1000 MCG/ML injection Commonly known as: VITAMIN B12 Inject 1,000 mcg into the muscle every 30 (thirty) days.   diltiazem 120 MG 24 hr capsule Commonly known as: CARDIZEM CD Take 1 capsule (120 mg total) by mouth daily. What changed: when to take this   feeding supplement Liqd Take 237 mLs  by mouth 3 (three) times daily between meals.   ferrous sulfate 325 (65 FE) MG tablet Take 1 tablet (325 mg total) by mouth every 3 (three) days.   furosemide 40 MG tablet Commonly known as: Lasix Take 1 tablet (40 mg total) by mouth daily.   guaiFENesin-dextromethorphan 100-10 MG/5ML syrup Commonly known as: ROBITUSSIN DM Take 5 mLs by mouth every 4 (four) hours as needed for cough (chest congestion).   ketoconazole 2 % cream Commonly known as: NIZORAL Apply 1 Application topically daily as needed for irritation. Apply to toes   memantine 10 MG tablet Commonly known as: NAMENDA Take 10 mg by mouth 2 (two) times daily.   metoprolol succinate 25 MG 24 hr tablet Commonly known as: TOPROL-XL Take 1 tablet (25 mg total) by mouth daily.    multivitamin with minerals tablet Take 1 tablet by mouth every evening.   Probiotic 250 MG Caps Take 250 mg by mouth daily.   senna-docusate 8.6-50 MG tablet Commonly known as: Senokot-S Take 1 tablet by mouth daily.   Vitamin D3 10 MCG (400 UNIT) Caps Take 400 Units by mouth 2 (two) times daily.               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 Naprosyn -feels dazed   Shellfish-Derived Products Other (See Comments)    "makes me jumpy and twitchy, but grand children are allergic"   Aloe Rash   Latex Rash   Sulfa Antibiotics Rash    Consultations: None   Procedures/Studies: DG Abd Portable 1V  Result Date: 04/30/2023 CLINICAL DATA:  Abdominal pain EXAM: PORTABLE ABDOMEN - 1 VIEW COMPARISON:  CT abdomen and pelvis dated 01/19/2023 FINDINGS: Nonobstructive bowel gas pattern. No free air or pneumatosis. Moderate volume stool throughout the colon. No abnormal radio-opaque calculi or mass effect. Partially imaged left proximal femoral fixation. IMPRESSION: Nonobstructive bowel gas pattern. Moderate volume stool throughout the colon. Electronically  Signed   By: Agustin Cree M.D.   On: 04/30/2023 09:18   CT LUMBAR SPINE WO CONTRAST  Result Date: 04/28/2023 CLINICAL DATA:  Low back pain.  Compression fractures. EXAM: CT LUMBAR SPINE WITHOUT CONTRAST TECHNIQUE: Multidetector CT imaging of the lumbar spine was performed without intravenous contrast administration. Multiplanar CT image reconstructions were also generated. RADIATION DOSE REDUCTION: This exam was performed according to the departmental dose-optimization program which includes automated exposure control, adjustment of the mA and/or kV according to patient  size and/or use of iterative reconstruction technique. COMPARISON:  CT abdomen and pelvis 01/19/2023 FINDINGS: Segmentation: 5 lumbar type vertebrae. Alignment: Mild lumbar levoscoliosis. Unchanged trace anterolisthesis of L5 on S1. Vertebrae: Unchanged chronic compression fractures with moderate vertebral body height loss at L1 and L3 and mild height loss at L2 and L4. Diffuse osteopenia. No acute fracture or suspicious osseous lesion. Paraspinal and other soft tissues: Aortic atherosclerosis. Disc levels: Asymmetric disc space narrowing on the right at L2-3 and on the left at L4-5. Moderate to severe multilevel facet hypertrophy. Moderate spinal stenosis at L3-4 due to disc bulging and facet and ligamentum flavum hypertrophy. Mild-to-moderate multilevel neural foraminal stenosis. IMPRESSION: 1. Multiple chronic lumbar compression fractures without an acute fracture identified. 2. Moderate spinal stenosis at L3-4. 3.  Aortic Atherosclerosis (ICD10-I70.0). Electronically Signed   By: Sebastian Ache M.D.   On: 04/28/2023 14:14   CT THORACIC SPINE WO CONTRAST  Result Date: 04/28/2023 CLINICAL DATA:  Thoracic compression fractures.  Back pain. EXAM: CT THORACIC SPINE WITHOUT CONTRAST TECHNIQUE: Multidetector CT images of the thoracic were obtained using the standard protocol without intravenous contrast. RADIATION DOSE REDUCTION: This exam was  performed according to the departmental dose-optimization program which includes automated exposure control, adjustment of the mA and/or kV according to patient size and/or use of iterative reconstruction technique. COMPARISON:  CTA chest 04/19/2023 and 01/05/2023 FINDINGS: Alignment: Exaggerated thoracic kyphosis. Mild upper thoracic levoscoliosis. No significant listhesis. Vertebrae: Chronic T1-T3 and T7-T12 compression fractures, all unchanged from 01/05/2023 with severe height loss noted at T11 and moderate height loss at T7, T8, and T10. No acute fracture or suspicious osseous lesion. Paraspinal and other soft tissues: Small bilateral pleural effusions, increased in size on the right since 04/19/2023. Bilateral lower lobe atelectasis and upper lobe scarring. Disc levels: Predominantly mild for age disc degeneration and facet arthrosis without evidence of high-grade spinal stenosis. IMPRESSION: 1. Numerous chronic thoracic compression fractures without an acute fracture. 2. Mild disc and facet degeneration without evidence of high-grade spinal stenosis. 3. Small bilateral pleural effusions. Electronically Signed   By: Sebastian Ache M.D.   On: 04/28/2023 13:48   DG Chest Port 1 View  Result Date: 04/28/2023 CLINICAL DATA:  Shortness of breath. EXAM: PORTABLE CHEST 1 VIEW COMPARISON:  Chest radiograph 04/27/2023 and chest CTA 04/19/2023 FINDINGS: The patient remains rotated to the left with unchanged cardiomediastinal silhouette. Left lung base opacity is similar to the prior radiograph and likely reflects a combination of the previously shown hiatal hernia, a persistent small left pleural effusion, and left basilar atelectasis or consolidation. Interstitial type densities in the left upper lung in the right lung base have increased. No pneumothorax is identified. IMPRESSION: 1. Unchanged small left pleural effusion and left basilar atelectasis or consolidation. 2. Increased interstitial densities in the left  upper lung and right lung base which may reflect edema or infection. Electronically Signed   By: Sebastian Ache M.D.   On: 04/28/2023 07:57   DG Chest 1 View  Result Date: 04/27/2023 CLINICAL DATA:  Pain. EXAM: CHEST  1 VIEW COMPARISON:  Radiograph 04/23/2023, CT 04/19/2023 FINDINGS: Significant patient rotation. Heart is grossly normal in size. Left lung base opacity likely in part related to retrocardiac hiatal hernia. Small left pleural effusion is similar. The right lung is clear. No pneumothorax. The bones are diffusely under mineralized. IMPRESSION: Left lung base opacity likely in part related to retrocardiac hiatal hernia. Small left pleural effusion is similar. Electronically Signed   By: Ivette Loyal.D.  On: 04/27/2023 15:09   DG CHEST PORT 1 VIEW  Result Date: 04/23/2023 CLINICAL DATA:  [id number] Pleural effusion on left [id number] EXAM: PORTABLE CHEST 1 VIEW COMPARISON:  CXR 04/22/23 FINDINGS: Unchanged likely small left-sided pleural effusion. Low lung volumes. Compared to prior exam there is interval increase in bilateral perihilar opacities, which could represent pulmonary venous congestion or infection. No radiographically apparent new displaced rib fracture. Visualized upper abdomen unremarkable. IMPRESSION: 1. Unchanged small left-sided pleural effusion. 2. Compared to prior exam there is interval increase in bilateral perihilar opacities, which could represent pulmonary venous congestion or infection. Electronically Signed   By: Lorenza Cambridge M.D.   On: 04/23/2023 08:00   DG Chest Port 1 View  Result Date: 04/22/2023 CLINICAL DATA:  Acute respiratory distress EXAM: PORTABLE CHEST 1 VIEW COMPARISON:  04/19/2023 and previous FINDINGS: Right lung remains clear. Large hiatal hernia again noted. Abnormal density in the left lower chest relating to pleural fluid and left lower lung volume loss/infiltrate appears similar. Indistinct density at the left apex appears similar. No worsening or new  finding. IMPRESSION: No change since yesterday. Large hiatal hernia. Left pleural fluid and left lower lung volume loss/infiltrate. Indistinct density at the left apex. Electronically Signed   By: Paulina Fusi M.D.   On: 04/22/2023 12:03   CT Angio Chest PE W and/or Wo Contrast  Result Date: 04/19/2023 CLINICAL DATA:  Cough. EXAM: CT ANGIOGRAPHY CHEST WITH CONTRAST TECHNIQUE: Multidetector CT imaging of the chest was performed using the standard protocol during bolus administration of intravenous contrast. Multiplanar CT image reconstructions and MIPs were obtained to evaluate the vascular anatomy. RADIATION DOSE REDUCTION: This exam was performed according to the departmental dose-optimization program which includes automated exposure control, adjustment of the mA and/or kV according to patient size and/or use of iterative reconstruction technique. CONTRAST:  65mL OMNIPAQUE IOHEXOL 350 MG/ML SOLN COMPARISON:  January 05, 2023. FINDINGS: Cardiovascular: Satisfactory opacification of the pulmonary arteries to the segmental level. No evidence of pulmonary embolism. Normal heart size. No pericardial effusion. Mediastinum/Nodes: Large hiatal hernia. No adenopathy. Thyroid gland is unremarkable. Lungs/Pleura: Small left pleural effusion is noted with adjacent subsegmental atelectasis. Stable left upper lobe scarring is noted. Minimal right pleural effusion is noted with adjacent subsegmental atelectasis. Upper Abdomen: No acute abnormality. Musculoskeletal: Multiple unchanged old lower thoracic compression fractures are noted. Review of the MIP images confirms the above findings. IMPRESSION: No definite evidence of pulmonary embolus. Large hiatal hernia. Small left pleural effusion with adjacent subsegmental atelectasis of left lower lobe. Minimal right pleural effusion is noted with adjacent subsegmental atelectasis. Aortic Atherosclerosis (ICD10-I70.0). Electronically Signed   By: Lupita Raider M.D.   On:  04/19/2023 18:36   DG Chest Port 1 View  Result Date: 04/19/2023 CLINICAL DATA:  Shortness of breath EXAM: PORTABLE CHEST 1 VIEW COMPARISON:  April 17, 2023, January 19, 2023 FINDINGS: Evaluation is limited by rotation. The cardiomediastinal silhouette is unchanged in contour.Similar LEFT retrocardiac opacity in comparison to priors. Similar small LEFT pleural effusion. No pneumothorax. No acute pleuroparenchymal abnormality. IMPRESSION: 1. Similar LEFT retrocardiac opacity in comparison to priors. This is favored to reflect atelectasis/scar. 2. Similar small LEFT pleural effusion. Electronically Signed   By: Meda Klinefelter M.D.   On: 04/19/2023 17:32   DG Chest Portable 1 View  Result Date: 04/17/2023 CLINICAL DATA:  Cough, fever, recent positive COVID test EXAM: PORTABLE CHEST 1 VIEW COMPARISON:  01/19/2023 FINDINGS: Transverse diameter of heart is increased. Apparent shift of mediastinum to the left  may be due to rotation. There is a hiatal hernia in retrocardiac region. There are no signs of pulmonary edema or focal pulmonary consolidation. Posterolateral aspect of left fifth and sixth ribs are indistinct. IMPRESSION: Cardiomegaly. There are no signs of pulmonary edema or focal pulmonary consolidation. Hiatal hernia. Posterolateral aspect of left fifth and sixth ribs are indistinct. Follow-up routine radiographs of the left ribs may be considered for further evaluation. Electronically Signed   By: Ernie Avena M.D.   On: 04/17/2023 19:03      Subjective: No significant events overnight as discussed with staff, she denies any complaints today  Discharge Exam: Vitals:   05/01/23 0747 05/01/23 1119  BP: (!) 113/54 112/61  Pulse: 88 83  Resp: (!) 22 15  Temp: (!) 97.5 F (36.4 C) 97.9 F (36.6 C)  SpO2: 97% 94%   Vitals:   05/01/23 0517 05/01/23 0600 05/01/23 0747 05/01/23 1119  BP: (!) 147/85 118/62 (!) 113/54 112/61  Pulse:  90 88 83  Resp:  (!) 25 (!) 22 15  Temp:   (!) 97.5  F (36.4 C) 97.9 F (36.6 C)  TempSrc:   Oral Oral  SpO2:  96% 97% 94%  Weight:      Height:        General: Pt is alert, awake, not in acute distress Cardiovascular: RRR, S1/S2 +, no rubs, no gallops Respiratory: CTA bilaterally, no wheezing, no rhonchi Abdominal: Soft, NT, ND, bowel sounds + Extremities: no edema, no cyanosis    The results of significant diagnostics from this hospitalization (including imaging, microbiology, ancillary and laboratory) are listed below for reference.     Microbiology: Recent Results (from the past 240 hour(s))  Blood culture (routine x 2)     Status: Abnormal (Preliminary result)   Collection Time: 04/27/23  4:57 PM   Specimen: BLOOD  Result Value Ref Range Status   Specimen Description BLOOD SITE NOT SPECIFIED  Final   Special Requests   Final    BOTTLES DRAWN AEROBIC AND ANAEROBIC Blood Culture adequate volume   Culture  Setup Time   Final    GRAM NEGATIVE RODS IN BOTH AEROBIC AND ANAEROBIC BOTTLES CRITICAL RESULT CALLED TO, READ BACK BY AND VERIFIED WITH: PHARMD S.NUDEY AT 1144 ON 04/28/2023 BY T.SAAD. Performed at [facility], [address] [place], [place] [address]    Culture KLEBSIELLA PNEUMONIAE (A)  Final   Report Status PENDING  Incomplete   Organism ID, Bacteria KLEBSIELLA PNEUMONIAE  Final      Susceptibility   Klebsiella pneumoniae - MIC*    AMPICILLIN RESISTANT Resistant     CEFEPIME <=0.12 SENSITIVE Sensitive     CEFTAZIDIME <=1 SENSITIVE Sensitive     CEFTRIAXONE <=0.25 SENSITIVE Sensitive     CIPROFLOXACIN <=0.25 SENSITIVE Sensitive     GENTAMICIN <=1 SENSITIVE Sensitive     IMIPENEM <=0.25 SENSITIVE Sensitive     TRIMETH/SULFA <=20 SENSITIVE Sensitive     AMPICILLIN/SULBACTAM 4 SENSITIVE Sensitive     PIP/TAZO <=4 SENSITIVE Sensitive     * KLEBSIELLA PNEUMONIAE  Blood Culture ID Panel (Reflexed)     Status: Abnormal   Collection Time: 04/27/23  4:57 PM  Result Value Ref Range Status   Enterococcus  faecalis NOT DETECTED NOT DETECTED Final   Enterococcus Faecium NOT DETECTED NOT DETECTED Final   Listeria monocytogenes NOT DETECTED NOT DETECTED Final   Staphylococcus species NOT DETECTED NOT DETECTED Final   Staphylococcus aureus (BCID) NOT DETECTED NOT DETECTED Final  Staphylococcus epidermidis NOT DETECTED NOT DETECTED Final   Staphylococcus lugdunensis NOT DETECTED NOT DETECTED Final   Streptococcus species NOT DETECTED NOT DETECTED Final   Streptococcus agalactiae NOT DETECTED NOT DETECTED Final   Streptococcus pneumoniae NOT DETECTED NOT DETECTED Final   Streptococcus pyogenes NOT DETECTED NOT DETECTED Final   A.calcoaceticus-baumannii NOT DETECTED NOT DETECTED Final   Bacteroides fragilis NOT DETECTED NOT DETECTED Final   Enterobacterales DETECTED (A) NOT DETECTED Final    Comment: Enterobacterales represent a large order of gram negative bacteria, not a single organism. CRITICAL RESULT CALLED TO, READ BACK BY AND VERIFIED WITH: PHARMD S.NUDEY AT 1144 ON 04/28/2023 BY T.SAAD.    Enterobacter cloacae complex NOT DETECTED NOT DETECTED Final   Escherichia coli NOT DETECTED NOT DETECTED Final   Klebsiella aerogenes NOT DETECTED NOT DETECTED Final   Klebsiella oxytoca NOT DETECTED NOT DETECTED Final   Klebsiella pneumoniae DETECTED (A) NOT DETECTED Final    Comment: CRITICAL RESULT CALLED TO, READ BACK BY AND VERIFIED WITH: PHARMD S.NUDEY AT 1144 ON 04/28/2023 BY T.SAAD.    Proteus species NOT DETECTED NOT DETECTED Final   Salmonella species NOT DETECTED NOT DETECTED Final   Serratia marcescens NOT DETECTED NOT DETECTED Final   Haemophilus influenzae NOT DETECTED NOT DETECTED Final   Neisseria meningitidis NOT DETECTED NOT DETECTED Final   Pseudomonas aeruginosa NOT DETECTED NOT DETECTED Final   Stenotrophomonas maltophilia NOT DETECTED NOT DETECTED Final   Candida albicans NOT DETECTED NOT DETECTED Final   Candida auris NOT DETECTED NOT DETECTED Final   Candida glabrata NOT  DETECTED NOT DETECTED Final   Candida krusei NOT DETECTED NOT DETECTED Final   Candida parapsilosis NOT DETECTED NOT DETECTED Final   Candida tropicalis NOT DETECTED NOT DETECTED Final   Cryptococcus neoformans/gattii NOT DETECTED NOT DETECTED Final   CTX-M ESBL NOT DETECTED NOT DETECTED Final   Carbapenem resistance IMP NOT DETECTED NOT DETECTED Final   Carbapenem resistance KPC NOT DETECTED NOT DETECTED Final   Carbapenem resistance NDM NOT DETECTED NOT DETECTED Final   Carbapenem resist OXA 48 LIKE NOT DETECTED NOT DETECTED Final   Carbapenem resistance VIM NOT DETECTED NOT DETECTED Final    Comment: Performed at [facility], [address], [place], [place] [address]  Blood culture (routine x 2)     Status: Abnormal   Collection Time: 04/27/23  5:42 PM   Specimen: BLOOD  Result Value Ref Range Status   Specimen Description BLOOD SITE NOT SPECIFIED  Final   Special Requests   Final    BOTTLES DRAWN AEROBIC AND ANAEROBIC Blood Culture adequate volume   Culture  Setup Time   Final    GRAM NEGATIVE RODS AEROBIC BOTTLE ONLY CRITICAL RESULT CALLED TO, READ BACK BY AND VERIFIED WITH: PHARMD K. AHMEND [id number] @ 2218 FH     Culture (A)  Final    KLEBSIELLA PNEUMONIAE SUSCEPTIBILITIES PERFORMED ON PREVIOUS CULTURE WITHIN THE LAST 5 DAYS. Performed at [facility], [address] [place], [place] [address]    Report Status 04/30/2023 FINAL  Final  MRSA Next Gen by PCR, Nasal     Status: None   Collection Time: 04/28/23  1:23 AM   Specimen: Nasal Mucosa; Nasal Swab  Result Value Ref Range Status   MRSA by PCR Next Gen NOT DETECTED NOT DETECTED Final    Comment: (NOTE) The GeneXpert MRSA Assay (FDA approved for NASAL specimens only), is one component of a comprehensive MRSA colonization surveillance program. It is not  intended to diagnose MRSA infection nor to guide or monitor treatment for MRSA infections. Test performance is not FDA approved in patients less than 55  years old. Performed at [facility], [address], [place], [place] [address]      Labs: BNP (last 3 results) Recent Labs    04/29/23 0035 04/30/23 0823 05/01/23 0156  BNP 322.2* 307.3* 408.7*   Basic Metabolic Panel: Recent Labs  Lab 04/27/23 1405 04/27/23 1511 04/28/23 0659 04/29/23 0035 04/30/23 0823 05/01/23 0156  NA 131* 130* 131* 130* 130* 131*  K 3.9 4.6 3.8 3.6 4.8 3.9  CL 90*  --  94* 94* 95* 97*  CO2 24  --  26 26 25 25   GLUCOSE 106*  --  95 104* 125* 109*  BUN 20  --  16 13 14 11   CREATININE 1.05*  --  0.82 0.77 0.75 0.84  CALCIUM 8.5*  --  8.5* 8.4* 8.9 8.9  MG  --   --  1.8 1.8 1.9 2.1   Liver Function Tests: Recent Labs  Lab 04/27/23 1405 04/28/23 0659 04/29/23 0035 04/30/23 0823 05/01/23 0156  AST 45* 32 30 38 29  ALT 41 32 30 37 32  ALKPHOS 135* 120 107 117 111  BILITOT 0.7 0.5 0.3 0.5 0.3  PROT 6.7 6.0* 5.5* 6.5 5.8*  ALBUMIN 2.5* 2.2* 1.9* 2.4* 2.1*   No results for input(s): "LIPASE", "AMYLASE" in the last 168 hours. No results for input(s): "AMMONIA" in the last 168 hours. CBC: Recent Labs  Lab 04/28/23 0659 04/28/23 1028 04/29/23 0035 04/30/23 0823 05/01/23 0156  WBC 18.4* 12.6* 12.3* 14.7* 12.2*  NEUTROABS 15.3* 10.7* 10.0* 12.5* 9.3*  HGB 7.7* 9.5* 9.3* 11.2* 9.9*  HCT 24.5* 29.4* 29.9* 35.5* 31.0*  MCV 88.8 87.8 87.4 88.8 86.8  PLT 405* 339 359 438* 418*   Cardiac Enzymes: No results for input(s): "CKTOTAL", "CKMB", "CKMBINDEX", "TROPONINI" in the last 168 hours. BNP: Invalid input(s): "POCBNP" CBG: No results for input(s): "GLUCAP" in the last 168 hours. D-Dimer No results for input(s): "DDIMER" in the last 72 hours. Hgb A1c No results for input(s): "HGBA1C" in the last 72 hours. Lipid Profile No results for input(s): "CHOL", "HDL", "LDLCALC", "TRIG", "CHOLHDL", "LDLDIRECT" in the last 72 hours. Thyroid function studies No results for input(s): "TSH", "T4TOTAL", "T3FREE", "THYROIDAB" in the last 72  hours.  Invalid input(s): "FREET3" Anemia work up No results for input(s): "VITAMINB12", "FOLATE", "FERRITIN", "TIBC", "IRON", "RETICCTPCT" in the last 72 hours. Urinalysis    Component Value Date/Time   COLORURINE YELLOW 04/28/2023 0123   APPEARANCEUR HAZY (A) 04/28/2023 0123   LABSPEC 1.015 04/28/2023 0123   PHURINE 6.0 04/28/2023 0123   GLUCOSEU NEGATIVE 04/28/2023 0123   HGBUR SMALL (A) 04/28/2023 0123   BILIRUBINUR NEGATIVE 04/28/2023 0123   KETONESUR NEGATIVE 04/28/2023 0123   PROTEINUR 30 (A) 04/28/2023 0123   NITRITE NEGATIVE 04/28/2023 0123   LEUKOCYTESUR SMALL (A) 04/28/2023 0123   Sepsis Labs Recent Labs  Lab 04/28/23 1028 04/29/23 0035 04/30/23 0823 05/01/23 0156  WBC 12.6* 12.3* 14.7* 12.2*   Microbiology Recent Results (from the past 240 hour(s))  Blood culture (routine x 2)     Status: Abnormal (Preliminary result)   Collection Time: 04/27/23  4:57 PM   Specimen: BLOOD  Result Value Ref Range Status   Specimen Description BLOOD SITE NOT SPECIFIED  Final   Special Requests   Final    BOTTLES DRAWN AEROBIC AND ANAEROBIC Blood Culture adequate volume  Culture  Setup Time   Final    GRAM NEGATIVE RODS IN BOTH AEROBIC AND ANAEROBIC BOTTLES CRITICAL RESULT CALLED TO, READ BACK BY AND VERIFIED WITH: PHARMD S.NUDEY AT 1144 ON 04/28/2023 BY T.SAAD. Performed at [facility], [address], [place], [place] [address]    Culture KLEBSIELLA PNEUMONIAE (A)  Final   Report Status PENDING  Incomplete   Organism ID, Bacteria KLEBSIELLA PNEUMONIAE  Final      Susceptibility   Klebsiella pneumoniae - MIC*    AMPICILLIN RESISTANT Resistant     CEFEPIME <=0.12 SENSITIVE Sensitive     CEFTAZIDIME <=1 SENSITIVE Sensitive     CEFTRIAXONE <=0.25 SENSITIVE Sensitive     CIPROFLOXACIN <=0.25 SENSITIVE Sensitive     GENTAMICIN <=1 SENSITIVE Sensitive     IMIPENEM <=0.25 SENSITIVE Sensitive     TRIMETH/SULFA <=20 SENSITIVE Sensitive     AMPICILLIN/SULBACTAM 4  SENSITIVE Sensitive     PIP/TAZO <=4 SENSITIVE Sensitive     * KLEBSIELLA PNEUMONIAE  Blood Culture ID Panel (Reflexed)     Status: Abnormal   Collection Time: 04/27/23  4:57 PM  Result Value Ref Range Status   Enterococcus faecalis NOT DETECTED NOT DETECTED Final   Enterococcus Faecium NOT DETECTED NOT DETECTED Final   Listeria monocytogenes NOT DETECTED NOT DETECTED Final   Staphylococcus species NOT DETECTED NOT DETECTED Final   Staphylococcus aureus (BCID) NOT DETECTED NOT DETECTED Final   Staphylococcus epidermidis NOT DETECTED NOT DETECTED Final   Staphylococcus lugdunensis NOT DETECTED NOT DETECTED Final   Streptococcus species NOT DETECTED NOT DETECTED Final   Streptococcus agalactiae NOT DETECTED NOT DETECTED Final   Streptococcus pneumoniae NOT DETECTED NOT DETECTED Final   Streptococcus pyogenes NOT DETECTED NOT DETECTED Final   A.calcoaceticus-baumannii NOT DETECTED NOT DETECTED Final   Bacteroides fragilis NOT DETECTED NOT DETECTED Final   Enterobacterales DETECTED (A) NOT DETECTED Final    Comment: Enterobacterales represent a large order of gram negative bacteria, not a single organism. CRITICAL RESULT CALLED TO, READ BACK BY AND VERIFIED WITH: PHARMD S.NUDEY AT 1144 ON 04/28/2023 BY T.SAAD.    Enterobacter cloacae complex NOT DETECTED NOT DETECTED Final   Escherichia coli NOT DETECTED NOT DETECTED Final   Klebsiella aerogenes NOT DETECTED NOT DETECTED Final   Klebsiella oxytoca NOT DETECTED NOT DETECTED Final   Klebsiella pneumoniae DETECTED (A) NOT DETECTED Final    Comment: CRITICAL RESULT CALLED TO, READ BACK BY AND VERIFIED WITH: PHARMD S.NUDEY AT 1144 ON 04/28/2023 BY T.SAAD.    Proteus species NOT DETECTED NOT DETECTED Final   Salmonella species NOT DETECTED NOT DETECTED Final   Serratia marcescens NOT DETECTED NOT DETECTED Final   Haemophilus influenzae NOT DETECTED NOT DETECTED Final   Neisseria meningitidis NOT DETECTED NOT DETECTED Final   Pseudomonas  aeruginosa NOT DETECTED NOT DETECTED Final   Stenotrophomonas maltophilia NOT DETECTED NOT DETECTED Final   Candida albicans NOT DETECTED NOT DETECTED Final   Candida auris NOT DETECTED NOT DETECTED Final   Candida glabrata NOT DETECTED NOT DETECTED Final   Candida krusei NOT DETECTED NOT DETECTED Final   Candida parapsilosis NOT DETECTED NOT DETECTED Final   Candida tropicalis NOT DETECTED NOT DETECTED Final   Cryptococcus neoformans/gattii NOT DETECTED NOT DETECTED Final   CTX-M ESBL NOT DETECTED NOT DETECTED Final   Carbapenem resistance IMP NOT DETECTED NOT DETECTED Final   Carbapenem resistance KPC NOT DETECTED NOT DETECTED Final   Carbapenem resistance NDM NOT DETECTED NOT DETECTED Final   Carbapenem resist OXA  48 LIKE NOT DETECTED NOT DETECTED Final   Carbapenem resistance VIM NOT DETECTED NOT DETECTED Final    Comment: Performed at [facility], [address], [place], [place] [address]  Blood culture (routine x 2)     Status: Abnormal   Collection Time: 04/27/23  5:42 PM   Specimen: BLOOD  Result Value Ref Range Status   Specimen Description BLOOD SITE NOT SPECIFIED  Final   Special Requests   Final    BOTTLES DRAWN AEROBIC AND ANAEROBIC Blood Culture adequate volume   Culture  Setup Time   Final    GRAM NEGATIVE RODS AEROBIC BOTTLE ONLY CRITICAL RESULT CALLED TO, READ BACK BY AND VERIFIED WITH: PHARMD K. AHMEND [id number] @ 2218 FH     Culture (A)  Final    KLEBSIELLA PNEUMONIAE SUSCEPTIBILITIES PERFORMED ON PREVIOUS CULTURE WITHIN THE LAST 5 DAYS. Performed at [facility], [address], [place], [place] [address]    Report Status 04/30/2023 FINAL  Final  MRSA Next Gen by PCR, Nasal     Status: None   Collection Time: 04/28/23  1:23 AM   Specimen: Nasal Mucosa; Nasal Swab  Result Value Ref Range Status   MRSA by PCR Next Gen NOT DETECTED NOT DETECTED Final    Comment: (NOTE) The GeneXpert MRSA Assay (FDA approved for NASAL specimens only), is one  component of a comprehensive MRSA colonization surveillance program. It is not intended to diagnose MRSA infection nor to guide or monitor treatment for MRSA infections. Test performance is not FDA approved in patients less than 45 years old. Performed at [facility], [address], [place], [place] [address]      Time coordinating discharge: Over 30 minutes  SIGNED:   Huey Bienenstock, MD  [facility] 05/01/2023, 12:19 PM Pager   If 7PM-7AM, please contact night-coverage [email or web address]

## 2023-05-01 NOTE — Progress Notes (Signed)
Orthopedic Tech Progress Note Patient Details:  Gina Mcgee 1929/10/21 [id number] Called order into [facility] for resting hand splint Patient ID: Gina Mcgee, female   DOB: 08-02-1930, 89 y.o.   MRN: [id number]  [name] 05/01/2023, 9:58 AM

## 2023-05-01 NOTE — Progress Notes (Signed)
Pt was picked up by [facility] to transport pt home. Pt's belongings was taken by pt's son to her home. Discharge paperwork reviewed with son, Gala Romney, and the sitter in the room. Both verbalized understanding. Discharge paperwork given to son.

## 2023-05-06 ENCOUNTER — Ambulatory Visit: Payer: BLUE CROSS/BLUE SHIELD | Admitting: Cardiology

## 2023-05-06 DIAGNOSIS — M4856XD Collapsed vertebra, not elsewhere classified, lumbar region, subsequent encounter for fracture with routine healing: Secondary | ICD-10-CM | POA: Diagnosis not present

## 2023-05-06 DIAGNOSIS — Z8551 Personal history of malignant neoplasm of bladder: Secondary | ICD-10-CM | POA: Diagnosis not present

## 2023-05-06 DIAGNOSIS — J9601 Acute respiratory failure with hypoxia: Secondary | ICD-10-CM | POA: Diagnosis not present

## 2023-05-06 DIAGNOSIS — J9811 Atelectasis: Secondary | ICD-10-CM | POA: Diagnosis not present

## 2023-05-06 DIAGNOSIS — I700 Atherosclerosis of aorta: Secondary | ICD-10-CM | POA: Diagnosis not present

## 2023-05-06 DIAGNOSIS — E876 Hypokalemia: Secondary | ICD-10-CM | POA: Diagnosis not present

## 2023-05-06 DIAGNOSIS — Z8616 Personal history of COVID-19: Secondary | ICD-10-CM | POA: Diagnosis not present

## 2023-05-06 DIAGNOSIS — F0390 Unspecified dementia without behavioral disturbance: Secondary | ICD-10-CM | POA: Diagnosis not present

## 2023-05-06 DIAGNOSIS — J189 Pneumonia, unspecified organism: Secondary | ICD-10-CM | POA: Diagnosis not present

## 2023-05-06 DIAGNOSIS — Z87440 Personal history of urinary (tract) infections: Secondary | ICD-10-CM | POA: Diagnosis not present

## 2023-05-06 DIAGNOSIS — M47814 Spondylosis without myelopathy or radiculopathy, thoracic region: Secondary | ICD-10-CM | POA: Diagnosis not present

## 2023-05-06 DIAGNOSIS — K449 Diaphragmatic hernia without obstruction or gangrene: Secondary | ICD-10-CM | POA: Diagnosis not present

## 2023-05-06 DIAGNOSIS — M5134 Other intervertebral disc degeneration, thoracic region: Secondary | ICD-10-CM | POA: Diagnosis not present

## 2023-05-06 DIAGNOSIS — I480 Paroxysmal atrial fibrillation: Secondary | ICD-10-CM | POA: Diagnosis not present

## 2023-05-06 DIAGNOSIS — I5022 Chronic systolic (congestive) heart failure: Secondary | ICD-10-CM | POA: Diagnosis not present

## 2023-05-06 DIAGNOSIS — E871 Hypo-osmolality and hyponatremia: Secondary | ICD-10-CM | POA: Diagnosis not present

## 2023-05-06 DIAGNOSIS — M48061 Spinal stenosis, lumbar region without neurogenic claudication: Secondary | ICD-10-CM | POA: Diagnosis not present

## 2023-05-06 DIAGNOSIS — N1832 Chronic kidney disease, stage 3b: Secondary | ICD-10-CM | POA: Diagnosis not present

## 2023-05-06 DIAGNOSIS — I130 Hypertensive heart and chronic kidney disease with heart failure and stage 1 through stage 4 chronic kidney disease, or unspecified chronic kidney disease: Secondary | ICD-10-CM | POA: Diagnosis not present

## 2023-05-06 DIAGNOSIS — K219 Gastro-esophageal reflux disease without esophagitis: Secondary | ICD-10-CM | POA: Diagnosis not present

## 2023-05-06 DIAGNOSIS — M4854XD Collapsed vertebra, not elsewhere classified, thoracic region, subsequent encounter for fracture with routine healing: Secondary | ICD-10-CM | POA: Diagnosis not present

## 2023-05-06 DIAGNOSIS — A419 Sepsis, unspecified organism: Secondary | ICD-10-CM | POA: Diagnosis not present

## 2023-05-06 DIAGNOSIS — G9349 Other encephalopathy: Secondary | ICD-10-CM | POA: Diagnosis not present

## 2023-05-06 DIAGNOSIS — Z853 Personal history of malignant neoplasm of breast: Secondary | ICD-10-CM | POA: Diagnosis not present

## 2023-05-06 DIAGNOSIS — D631 Anemia in chronic kidney disease: Secondary | ICD-10-CM | POA: Diagnosis not present

## 2023-05-07 DIAGNOSIS — J9601 Acute respiratory failure with hypoxia: Secondary | ICD-10-CM | POA: Diagnosis not present

## 2023-05-07 DIAGNOSIS — R531 Weakness: Secondary | ICD-10-CM | POA: Diagnosis not present

## 2023-05-07 DIAGNOSIS — Z6825 Body mass index (BMI) 25.0-25.9, adult: Secondary | ICD-10-CM | POA: Diagnosis not present

## 2023-05-07 DIAGNOSIS — J150 Pneumonia due to Klebsiella pneumoniae: Secondary | ICD-10-CM | POA: Diagnosis not present

## 2023-05-07 DIAGNOSIS — J189 Pneumonia, unspecified organism: Secondary | ICD-10-CM | POA: Diagnosis not present

## 2023-05-07 DIAGNOSIS — K5900 Constipation, unspecified: Secondary | ICD-10-CM | POA: Diagnosis not present

## 2023-05-07 DIAGNOSIS — M4856XD Collapsed vertebra, not elsewhere classified, lumbar region, subsequent encounter for fracture with routine healing: Secondary | ICD-10-CM | POA: Diagnosis not present

## 2023-05-07 DIAGNOSIS — M8008XA Age-related osteoporosis with current pathological fracture, vertebra(e), initial encounter for fracture: Secondary | ICD-10-CM | POA: Diagnosis not present

## 2023-05-07 DIAGNOSIS — G9349 Other encephalopathy: Secondary | ICD-10-CM | POA: Diagnosis not present

## 2023-05-07 DIAGNOSIS — M4854XD Collapsed vertebra, not elsewhere classified, thoracic region, subsequent encounter for fracture with routine healing: Secondary | ICD-10-CM | POA: Diagnosis not present

## 2023-05-07 DIAGNOSIS — Z7401 Bed confinement status: Secondary | ICD-10-CM | POA: Diagnosis not present

## 2023-05-07 DIAGNOSIS — E441 Mild protein-calorie malnutrition: Secondary | ICD-10-CM | POA: Diagnosis not present

## 2023-05-07 DIAGNOSIS — A419 Sepsis, unspecified organism: Secondary | ICD-10-CM | POA: Diagnosis not present

## 2023-05-07 NOTE — Progress Notes (Signed)
Follow up visit  Subjective:   Gina Mcgee, female    DOB: 1930/01/27, 89 y.o.   MRN: [id number]   HPI  Chief Complaint  Patient presents with   Paroxysmal atrial fibrillation (HCC)    89 y.o. Caucasian female with hypertension, PAF, mild dementia, h/o breast cancer, recurrent UTI.     Patient has had recurrent hospitalization in the past two months with COVID 19, multifocal pneumonia w/hypoxia, sepsis, altered mental status.   Patient recovered fairly well, and is now at home.  She is here today with her daughter Santina Evans, and caretaker and she will.  She is transported by medical transportation in a stretcher.  Her biggest limiting symptom right now is severe back pain in the setting of known compression fractures.  She is not able to get out of bed due to this pain.  She is seeing PCP in being treated with Tylenol PM at this time.  Patient is not had any chest pain, shortness of breath, potation's.  She is now on oxygen at home.     Current Outpatient Medications:    acetaminophen (TYLENOL) 500 MG tablet, Take 500 mg by mouth every 6 (six) hours as needed for moderate pain., Disp: , Rfl:    apixaban (ELIQUIS) 2.5 MG TABS tablet, Take 1 tablet (2.5 mg total) by mouth 2 (two) times daily., Disp: 90 tablet, Rfl: 3   Cholecalciferol (VITAMIN D3) 10 MCG (400 UNIT) CAPS, Take 400 Units by mouth 2 (two) times daily., Disp: , Rfl:    cyanocobalamin (VITAMIN B12) 1000 MCG/ML injection, Inject 1,000 mcg into the muscle every 30 (thirty) days., Disp: , Rfl:    diltiazem (CARDIZEM CD) 120 MG 24 hr capsule, Take 1 capsule (120 mg total) by mouth daily. (Patient taking differently: Take 120 mg by mouth every evening.), Disp: 90 capsule, Rfl: 3   feeding supplement (ENSURE ENLIVE / ENSURE PLUS) LIQD, Take 237 mLs by mouth 3 (three) times daily between meals., Disp: , Rfl:    ferrous sulfate 325 (65 FE) MG tablet, Take 1 tablet (325 mg total) by mouth every 3 (three) days., Disp: , Rfl: 3    furosemide (LASIX) 40 MG tablet, Take 1 tablet (40 mg total) by mouth daily., Disp: 30 tablet, Rfl: 1   guaiFENesin-dextromethorphan (ROBITUSSIN DM) 100-10 MG/5ML syrup, Take 5 mLs by mouth every 4 (four) hours as needed for cough (chest congestion)., Disp: 118 mL, Rfl: 0   ketoconazole (NIZORAL) 2 % cream, Apply 1 Application topically daily as needed for irritation. Apply to toes, Disp: , Rfl:    memantine (NAMENDA) 10 MG tablet, Take 10 mg by mouth 2 (two) times daily., Disp: , Rfl:    metoprolol succinate (TOPROL-XL) 25 MG 24 hr tablet, Take 1 tablet (25 mg total) by mouth daily., Disp: 90 tablet, Rfl: 3   Multiple Vitamins-Minerals (MULTIVITAMIN WITH MINERALS) tablet, Take 1 tablet by mouth every evening., Disp: , Rfl:    Saccharomyces boulardii (PROBIOTIC) 250 MG CAPS, Take 250 mg by mouth daily., Disp: , Rfl:    senna-docusate (SENOKOT-S) 8.6-50 MG tablet, Take 1 tablet by mouth daily., Disp: , Rfl:    Cardiovascular & other pertient studies:  Reviewed external labs and tests, independently interpreted  EKG 05/08/2023: Probable sinus thythm  Nonspecific T-abnormality  Echocardiogram 08/13/2022:  1. Limited echo for edema   2. Left ventricular ejection fraction, by estimation, is 60 to 65%. Left  ventricular ejection fraction by PLAX is 65 %. The left ventricle has  normal function. The left ventricle has no regional wall motion  abnormalities. Left ventricular diastolic  parameters are consistent with Grade I diastolic dysfunction (impaired  relaxation).   3. The mitral valve is abnormal. Trivial mitral valve regurgitation.   4. Aortic valve regurgitation is mild.   Comparison(s): Changes from prior study are noted. 11/07/2021: LVEF 60-65%,  mild RV systolic dysfunction, mild AI and MR.  Recent labs: 05/01/2023: Glucose 109, BUN/Cr 11/0.84. EGFR >60. Na/K 131/3.9. Albumin 2.1. Protein 5.8. Rest of the CMP normal BNP 408 high CRP 16 high H/H 10/31. MCV 86. Platelets 418 TSH 4.6  normal  01/21/2023: Glucose 101, BUN/Cr 29/0.95. EGFR 57. Na/K 132/3.6. Albumin 3.0. Protein 6.2. Rest of the CMP normal H/H 9.4/31.5. MCV 87. Platelets 258 HbA1C NA   Review of Systems  Cardiovascular:  Negative for chest pain, dyspnea on exertion, leg swelling, palpitations and syncope.  Neurological:  Positive for light-headedness (Occasional).         Vitals:   05/08/23 1000  BP: 100/67  Pulse: 89  Resp: 16  SpO2: 96%     Body mass index is 21.87 kg/m.  Objective:   Physical Exam Vitals and nursing note reviewed.  Constitutional:      General: She is not in acute distress.    Comments: Currently bedbound due to back pain  Neck:     Vascular: No JVD.  Cardiovascular:     Rate and Rhythm: Normal rate and regular rhythm.     Heart sounds: Normal heart sounds. No murmur heard. Pulmonary:     Effort: Pulmonary effort is normal.     Breath sounds: Normal breath sounds. No wheezing or rales.  Musculoskeletal:     Right lower leg: No edema.     Left lower leg: No edema.             Visit diagnoses:   ICD-10-CM   1. Paroxysmal atrial fibrillation (HCC)  I48.0 EKG 12-Lead    LONG TERM MONITOR (3-14 DAYS)    2. Primary hypertension  I10         Orders Placed This Encounter  Procedures   LONG TERM MONITOR (3-14 DAYS)   EKG 12-Lead       Assessment & Recommendations:   89 y.o. Caucasian female with hypertension, PAF, mild dementia, h/o breast cancer, recurrent UTI.     Exertional dyspnea:  Likely multifactorial including advanced age, deconditioning, scoliosis etc. Recent hospitalization with COVID-pneumonia, possibly A-fib. Currently, she is on stable supplemental oxygen.  Continue the same.  PAF: Maintaining sinus rhythm.   With her low blood pressure, I have discontinued metoprolol.  Continue diltiazem 120 mg daily.  Daughter knows to give metoprolol to tartrate 25 mg dose, if heart rate >120 bpm.  I will place her on 2-week cardiac telemetry  Zio patch monitor to assess her heart rate and A-fib burden. Continue Eliquis 2.5 mg twice daily.  Given difficulties involved arranging medical transportation in the setting of bedbound patient, I recommend telephone visit for follow-up in 4 weeks.    Elder Negus, MD Pager: [phone] Office: [phone]

## 2023-05-08 ENCOUNTER — Other Ambulatory Visit: Payer: Medicare Other

## 2023-05-08 ENCOUNTER — Ambulatory Visit: Payer: Medicare Other | Admitting: Cardiology

## 2023-05-08 ENCOUNTER — Encounter: Payer: Self-pay | Admitting: Cardiology

## 2023-05-08 VITALS — BP 100/67 | HR 89 | Resp 16 | Ht 60.0 in

## 2023-05-08 DIAGNOSIS — I480 Paroxysmal atrial fibrillation: Secondary | ICD-10-CM | POA: Diagnosis not present

## 2023-05-08 DIAGNOSIS — R531 Weakness: Secondary | ICD-10-CM | POA: Diagnosis not present

## 2023-05-08 DIAGNOSIS — Z7401 Bed confinement status: Secondary | ICD-10-CM | POA: Diagnosis not present

## 2023-05-08 DIAGNOSIS — I10 Essential (primary) hypertension: Secondary | ICD-10-CM | POA: Diagnosis not present

## 2023-05-08 DIAGNOSIS — R4182 Altered mental status, unspecified: Secondary | ICD-10-CM | POA: Diagnosis not present

## 2023-05-11 DIAGNOSIS — J9601 Acute respiratory failure with hypoxia: Secondary | ICD-10-CM | POA: Diagnosis not present

## 2023-05-11 DIAGNOSIS — J189 Pneumonia, unspecified organism: Secondary | ICD-10-CM | POA: Diagnosis not present

## 2023-05-11 DIAGNOSIS — M4856XD Collapsed vertebra, not elsewhere classified, lumbar region, subsequent encounter for fracture with routine healing: Secondary | ICD-10-CM | POA: Diagnosis not present

## 2023-05-11 DIAGNOSIS — M4854XD Collapsed vertebra, not elsewhere classified, thoracic region, subsequent encounter for fracture with routine healing: Secondary | ICD-10-CM | POA: Diagnosis not present

## 2023-05-11 DIAGNOSIS — G9349 Other encephalopathy: Secondary | ICD-10-CM | POA: Diagnosis not present

## 2023-05-11 DIAGNOSIS — A419 Sepsis, unspecified organism: Secondary | ICD-10-CM | POA: Diagnosis not present

## 2023-05-15 DIAGNOSIS — A419 Sepsis, unspecified organism: Secondary | ICD-10-CM | POA: Diagnosis not present

## 2023-05-15 DIAGNOSIS — G9349 Other encephalopathy: Secondary | ICD-10-CM | POA: Diagnosis not present

## 2023-05-15 DIAGNOSIS — M4854XD Collapsed vertebra, not elsewhere classified, thoracic region, subsequent encounter for fracture with routine healing: Secondary | ICD-10-CM | POA: Diagnosis not present

## 2023-05-15 DIAGNOSIS — M4856XD Collapsed vertebra, not elsewhere classified, lumbar region, subsequent encounter for fracture with routine healing: Secondary | ICD-10-CM | POA: Diagnosis not present

## 2023-05-15 DIAGNOSIS — J9601 Acute respiratory failure with hypoxia: Secondary | ICD-10-CM | POA: Diagnosis not present

## 2023-05-15 DIAGNOSIS — J189 Pneumonia, unspecified organism: Secondary | ICD-10-CM | POA: Diagnosis not present

## 2023-05-20 DIAGNOSIS — M4854XD Collapsed vertebra, not elsewhere classified, thoracic region, subsequent encounter for fracture with routine healing: Secondary | ICD-10-CM | POA: Diagnosis not present

## 2023-05-20 DIAGNOSIS — M4856XD Collapsed vertebra, not elsewhere classified, lumbar region, subsequent encounter for fracture with routine healing: Secondary | ICD-10-CM | POA: Diagnosis not present

## 2023-05-20 DIAGNOSIS — G9349 Other encephalopathy: Secondary | ICD-10-CM | POA: Diagnosis not present

## 2023-05-20 DIAGNOSIS — J189 Pneumonia, unspecified organism: Secondary | ICD-10-CM | POA: Diagnosis not present

## 2023-05-20 DIAGNOSIS — A419 Sepsis, unspecified organism: Secondary | ICD-10-CM | POA: Diagnosis not present

## 2023-05-20 DIAGNOSIS — J9601 Acute respiratory failure with hypoxia: Secondary | ICD-10-CM | POA: Diagnosis not present

## 2023-05-21 DIAGNOSIS — J189 Pneumonia, unspecified organism: Secondary | ICD-10-CM | POA: Diagnosis not present

## 2023-05-21 DIAGNOSIS — A419 Sepsis, unspecified organism: Secondary | ICD-10-CM | POA: Diagnosis not present

## 2023-05-21 DIAGNOSIS — M4856XD Collapsed vertebra, not elsewhere classified, lumbar region, subsequent encounter for fracture with routine healing: Secondary | ICD-10-CM | POA: Diagnosis not present

## 2023-05-21 DIAGNOSIS — M4854XD Collapsed vertebra, not elsewhere classified, thoracic region, subsequent encounter for fracture with routine healing: Secondary | ICD-10-CM | POA: Diagnosis not present

## 2023-05-21 DIAGNOSIS — J9601 Acute respiratory failure with hypoxia: Secondary | ICD-10-CM | POA: Diagnosis not present

## 2023-05-21 DIAGNOSIS — G9349 Other encephalopathy: Secondary | ICD-10-CM | POA: Diagnosis not present

## 2023-05-22 DIAGNOSIS — A419 Sepsis, unspecified organism: Secondary | ICD-10-CM | POA: Diagnosis not present

## 2023-05-22 DIAGNOSIS — G9349 Other encephalopathy: Secondary | ICD-10-CM | POA: Diagnosis not present

## 2023-05-22 DIAGNOSIS — M4854XD Collapsed vertebra, not elsewhere classified, thoracic region, subsequent encounter for fracture with routine healing: Secondary | ICD-10-CM | POA: Diagnosis not present

## 2023-05-22 DIAGNOSIS — M4856XD Collapsed vertebra, not elsewhere classified, lumbar region, subsequent encounter for fracture with routine healing: Secondary | ICD-10-CM | POA: Diagnosis not present

## 2023-05-22 DIAGNOSIS — J9601 Acute respiratory failure with hypoxia: Secondary | ICD-10-CM | POA: Diagnosis not present

## 2023-05-22 DIAGNOSIS — J189 Pneumonia, unspecified organism: Secondary | ICD-10-CM | POA: Diagnosis not present

## 2023-05-27 DIAGNOSIS — G9349 Other encephalopathy: Secondary | ICD-10-CM | POA: Diagnosis not present

## 2023-05-27 DIAGNOSIS — M4854XD Collapsed vertebra, not elsewhere classified, thoracic region, subsequent encounter for fracture with routine healing: Secondary | ICD-10-CM | POA: Diagnosis not present

## 2023-05-27 DIAGNOSIS — A419 Sepsis, unspecified organism: Secondary | ICD-10-CM | POA: Diagnosis not present

## 2023-05-27 DIAGNOSIS — I480 Paroxysmal atrial fibrillation: Secondary | ICD-10-CM | POA: Diagnosis not present

## 2023-05-27 DIAGNOSIS — J9601 Acute respiratory failure with hypoxia: Secondary | ICD-10-CM | POA: Diagnosis not present

## 2023-05-27 DIAGNOSIS — M4856XD Collapsed vertebra, not elsewhere classified, lumbar region, subsequent encounter for fracture with routine healing: Secondary | ICD-10-CM | POA: Diagnosis not present

## 2023-05-27 DIAGNOSIS — J189 Pneumonia, unspecified organism: Secondary | ICD-10-CM | POA: Diagnosis not present

## 2023-05-29 DIAGNOSIS — J189 Pneumonia, unspecified organism: Secondary | ICD-10-CM | POA: Diagnosis not present

## 2023-05-29 DIAGNOSIS — J9601 Acute respiratory failure with hypoxia: Secondary | ICD-10-CM | POA: Diagnosis not present

## 2023-05-29 DIAGNOSIS — G9349 Other encephalopathy: Secondary | ICD-10-CM | POA: Diagnosis not present

## 2023-05-29 DIAGNOSIS — M4856XD Collapsed vertebra, not elsewhere classified, lumbar region, subsequent encounter for fracture with routine healing: Secondary | ICD-10-CM | POA: Diagnosis not present

## 2023-05-29 DIAGNOSIS — M4854XD Collapsed vertebra, not elsewhere classified, thoracic region, subsequent encounter for fracture with routine healing: Secondary | ICD-10-CM | POA: Diagnosis not present

## 2023-05-29 DIAGNOSIS — A419 Sepsis, unspecified organism: Secondary | ICD-10-CM | POA: Diagnosis not present

## 2023-05-30 DIAGNOSIS — I480 Paroxysmal atrial fibrillation: Secondary | ICD-10-CM | POA: Diagnosis not present

## 2023-05-30 NOTE — Progress Notes (Unsigned)
Telephone visit  Subjective:   Gina Mcgee, female    DOB: 12/25/1929, 89 y.o.   MRN: [id number]   HPI  Chief Complaint  Patient presents with   Paroxysmal atrial fibrillation (HCC)    89 y.o. Caucasian female with hypertension, PAF, mild dementia, h/o breast cancer, recurrent UTI.     Spoke with patient and her daughter over the phone.  Patient is still in hospital bed, but is doing PT once a week, and exercises twice a week at home.  She still requires her to lift to get her out of bed, but is standing some with her.  Her compression fracture pain is somewhat improved.  She is still requiring 1.5 L of oxygen.  She has not noticed any prolonged episodes of palpitations.  Reviewed monitor results with the patient, details below.   Current Outpatient Medications:    acetaminophen (TYLENOL) 500 MG tablet, Take 500 mg by mouth every 6 (six) hours as needed for moderate pain., Disp: , Rfl:    apixaban (ELIQUIS) 2.5 MG TABS tablet, Take 1 tablet (2.5 mg total) by mouth 2 (two) times daily., Disp: 90 tablet, Rfl: 3   Cholecalciferol (VITAMIN D3) 10 MCG (400 UNIT) CAPS, Take 400 Units by mouth 2 (two) times daily., Disp: , Rfl:    cyanocobalamin (VITAMIN B12) 1000 MCG/ML injection, Inject 1,000 mcg into the muscle every 30 (thirty) days., Disp: , Rfl:    diltiazem (CARDIZEM CD) 120 MG 24 hr capsule, Take 1 capsule (120 mg total) by mouth daily. (Patient taking differently: Take 120 mg by mouth every evening.), Disp: 90 capsule, Rfl: 3   feeding supplement (ENSURE ENLIVE / ENSURE PLUS) LIQD, Take 237 mLs by mouth 3 (three) times daily between meals., Disp: , Rfl:    ferrous sulfate 325 (65 FE) MG tablet, Take 1 tablet (325 mg total) by mouth every 3 (three) days., Disp: , Rfl: 3   furosemide (LASIX) 40 MG tablet, Take 1 tablet (40 mg total) by mouth daily., Disp: 30 tablet, Rfl: 1   guaiFENesin-dextromethorphan (ROBITUSSIN DM) 100-10 MG/5ML syrup, Take 5 mLs by mouth every 4 (four) hours as  needed for cough (chest congestion)., Disp: 118 mL, Rfl: 0   ketoconazole (NIZORAL) 2 % cream, Apply 1 Application topically daily as needed for irritation. Apply to toes, Disp: , Rfl:    memantine (NAMENDA) 10 MG tablet, Take 10 mg by mouth 2 (two) times daily., Disp: , Rfl:    metoprolol succinate (TOPROL-XL) 25 MG 24 hr tablet, Take 1 tablet (25 mg total) by mouth daily., Disp: 90 tablet, Rfl: 3   Multiple Vitamins-Minerals (MULTIVITAMIN WITH MINERALS) tablet, Take 1 tablet by mouth every evening., Disp: , Rfl:    Saccharomyces boulardii (PROBIOTIC) 250 MG CAPS, Take 250 mg by mouth daily., Disp: , Rfl:    senna-docusate (SENOKOT-S) 8.6-50 MG tablet, Take 1 tablet by mouth daily., Disp: , Rfl:    Cardiovascular & other pertient studies:  Reviewed external labs and tests, independently interpreted  EKG 05/08/2023: Probable sinus thythm  Nonspecific T-abnormality  Mobile cardiac telemetry 13 days 05/08/2023 - 05/22/2023: Dominant rhythm: Sinus. HR 71-127 bpm. Avg HR 91 bpm, in sinus rhythm. 134 episodes of atrial tachycardia/SVT, fastest at 214 bpm for 7 beats, longest for 20 beats at 149 bpm. 6.3%% isolated SVE, <1% couplet/triplets. 1 episode of VT, fastest at 188 bpm for 4 beats. <1% isolated VE, couplet/triplets. No atrial fibrillation/atrial flutter/high grade AV block, sinus pause >3sec noted. 0 patient triggered events.  Echocardiogram 08/13/2022:  1. Limited echo for edema   2. Left ventricular ejection fraction, by estimation, is 60 to 65%. Left  ventricular ejection fraction by PLAX is 65 %. The left ventricle has  normal function. The left ventricle has no regional wall motion  abnormalities. Left ventricular diastolic  parameters are consistent with Grade I diastolic dysfunction (impaired  relaxation).   3. The mitral valve is abnormal. Trivial mitral valve regurgitation.   4. Aortic valve regurgitation is mild.   Comparison(s): Changes from prior study are noted.  11/07/2021: LVEF 60-65%,  mild RV systolic dysfunction, mild AI and MR.  Recent labs: 05/01/2023: Glucose 109, BUN/Cr 11/0.84. EGFR >60. Na/K 131/3.9. Albumin 2.1. Protein 5.8. Rest of the CMP normal BNP 408 high CRP 16 high H/H 10/31. MCV 86. Platelets 418 TSH 4.6 normal  01/21/2023: Glucose 101, BUN/Cr 29/0.95. EGFR 57. Na/K 132/3.6. Albumin 3.0. Protein 6.2. Rest of the CMP normal H/H 9.4/31.5. MCV 87. Platelets 258 HbA1C NA   Review of Systems  Cardiovascular:  Negative for chest pain, dyspnea on exertion, leg swelling, palpitations and syncope.  Musculoskeletal:  Positive for back pain.  Neurological:  Positive for light-headedness (Occasional).        Vitals:   06/01/23 1223  BP: (!) 126/93  Pulse: 88  SpO2: 96%      Body mass index is 21.87 kg/m.  Objective:   Physical Exam  Not performed.  Telephone visit.        Visit diagnoses:   ICD-10-CM   1. Paroxysmal atrial fibrillation (HCC)  I48.0     2. Paroxysmal atrial tachycardia  I47.19          Assessment & Recommendations:   89 y.o. Caucasian female with hypertension, PAF, mild dementia, h/o breast cancer, recurrent UTI.     Exertional dyspnea:  Controlled well on Lasix 40 mg daily.  Will refill.  PAF/PAT: Short lasting PAT episodes.  No A-fib on recent monitor, previously reported. Continue diltiazem 120 mg daily. Continue Eliquis 2.5 mg twice daily.  I will see her on as-needed basis.   Elder Negus, MD Pager: [phone] Office: [phone]

## 2023-06-01 ENCOUNTER — Telehealth: Payer: Medicare Other | Admitting: Cardiology

## 2023-06-01 ENCOUNTER — Encounter: Payer: Self-pay | Admitting: Cardiology

## 2023-06-01 VITALS — BP 126/93 | HR 88 | Ht 60.0 in

## 2023-06-01 DIAGNOSIS — I4719 Other supraventricular tachycardia: Secondary | ICD-10-CM | POA: Diagnosis not present

## 2023-06-01 DIAGNOSIS — I480 Paroxysmal atrial fibrillation: Secondary | ICD-10-CM | POA: Diagnosis not present

## 2023-06-01 MED ORDER — DILTIAZEM HCL ER COATED BEADS 120 MG PO CP24
120.0000 mg | ORAL_CAPSULE | Freq: Every day | ORAL | 3 refills | Status: AC
Start: 2023-06-01 — End: ?

## 2023-06-01 MED ORDER — FUROSEMIDE 40 MG PO TABS
40.0000 mg | ORAL_TABLET | Freq: Every day | ORAL | 3 refills | Status: AC
Start: 2023-06-01 — End: 2024-05-31

## 2023-06-03 DIAGNOSIS — E538 Deficiency of other specified B group vitamins: Secondary | ICD-10-CM | POA: Diagnosis not present

## 2023-06-03 DIAGNOSIS — Z9981 Dependence on supplemental oxygen: Secondary | ICD-10-CM | POA: Diagnosis not present

## 2023-06-03 DIAGNOSIS — Z23 Encounter for immunization: Secondary | ICD-10-CM | POA: Diagnosis not present

## 2023-06-03 DIAGNOSIS — R531 Weakness: Secondary | ICD-10-CM | POA: Diagnosis not present

## 2023-06-03 DIAGNOSIS — Z7189 Other specified counseling: Secondary | ICD-10-CM | POA: Diagnosis not present

## 2023-06-03 DIAGNOSIS — M8008XA Age-related osteoporosis with current pathological fracture, vertebra(e), initial encounter for fracture: Secondary | ICD-10-CM | POA: Diagnosis not present

## 2023-06-03 DIAGNOSIS — R0902 Hypoxemia: Secondary | ICD-10-CM | POA: Diagnosis not present

## 2023-06-03 DIAGNOSIS — Z7401 Bed confinement status: Secondary | ICD-10-CM | POA: Diagnosis not present

## 2023-06-03 DIAGNOSIS — E441 Mild protein-calorie malnutrition: Secondary | ICD-10-CM | POA: Diagnosis not present

## 2023-06-03 DIAGNOSIS — J150 Pneumonia due to Klebsiella pneumoniae: Secondary | ICD-10-CM | POA: Diagnosis not present

## 2023-06-05 DIAGNOSIS — E871 Hypo-osmolality and hyponatremia: Secondary | ICD-10-CM | POA: Diagnosis not present

## 2023-06-05 DIAGNOSIS — M4856XD Collapsed vertebra, not elsewhere classified, lumbar region, subsequent encounter for fracture with routine healing: Secondary | ICD-10-CM | POA: Diagnosis not present

## 2023-06-05 DIAGNOSIS — Z8616 Personal history of COVID-19: Secondary | ICD-10-CM | POA: Diagnosis not present

## 2023-06-05 DIAGNOSIS — N1832 Chronic kidney disease, stage 3b: Secondary | ICD-10-CM | POA: Diagnosis not present

## 2023-06-05 DIAGNOSIS — M48061 Spinal stenosis, lumbar region without neurogenic claudication: Secondary | ICD-10-CM | POA: Diagnosis not present

## 2023-06-05 DIAGNOSIS — J9811 Atelectasis: Secondary | ICD-10-CM | POA: Diagnosis not present

## 2023-06-05 DIAGNOSIS — J189 Pneumonia, unspecified organism: Secondary | ICD-10-CM | POA: Diagnosis not present

## 2023-06-05 DIAGNOSIS — M4854XD Collapsed vertebra, not elsewhere classified, thoracic region, subsequent encounter for fracture with routine healing: Secondary | ICD-10-CM | POA: Diagnosis not present

## 2023-06-05 DIAGNOSIS — I700 Atherosclerosis of aorta: Secondary | ICD-10-CM | POA: Diagnosis not present

## 2023-06-05 DIAGNOSIS — J9601 Acute respiratory failure with hypoxia: Secondary | ICD-10-CM | POA: Diagnosis not present

## 2023-06-05 DIAGNOSIS — A419 Sepsis, unspecified organism: Secondary | ICD-10-CM | POA: Diagnosis not present

## 2023-06-05 DIAGNOSIS — F0390 Unspecified dementia without behavioral disturbance: Secondary | ICD-10-CM | POA: Diagnosis not present

## 2023-06-05 DIAGNOSIS — I480 Paroxysmal atrial fibrillation: Secondary | ICD-10-CM | POA: Diagnosis not present

## 2023-06-05 DIAGNOSIS — M47814 Spondylosis without myelopathy or radiculopathy, thoracic region: Secondary | ICD-10-CM | POA: Diagnosis not present

## 2023-06-05 DIAGNOSIS — Z8551 Personal history of malignant neoplasm of bladder: Secondary | ICD-10-CM | POA: Diagnosis not present

## 2023-06-05 DIAGNOSIS — I130 Hypertensive heart and chronic kidney disease with heart failure and stage 1 through stage 4 chronic kidney disease, or unspecified chronic kidney disease: Secondary | ICD-10-CM | POA: Diagnosis not present

## 2023-06-05 DIAGNOSIS — G9349 Other encephalopathy: Secondary | ICD-10-CM | POA: Diagnosis not present

## 2023-06-05 DIAGNOSIS — Z853 Personal history of malignant neoplasm of breast: Secondary | ICD-10-CM | POA: Diagnosis not present

## 2023-06-05 DIAGNOSIS — K219 Gastro-esophageal reflux disease without esophagitis: Secondary | ICD-10-CM | POA: Diagnosis not present

## 2023-06-05 DIAGNOSIS — D631 Anemia in chronic kidney disease: Secondary | ICD-10-CM | POA: Diagnosis not present

## 2023-06-05 DIAGNOSIS — I5022 Chronic systolic (congestive) heart failure: Secondary | ICD-10-CM | POA: Diagnosis not present

## 2023-06-05 DIAGNOSIS — M5134 Other intervertebral disc degeneration, thoracic region: Secondary | ICD-10-CM | POA: Diagnosis not present

## 2023-06-05 DIAGNOSIS — K449 Diaphragmatic hernia without obstruction or gangrene: Secondary | ICD-10-CM | POA: Diagnosis not present

## 2023-06-05 DIAGNOSIS — Z87440 Personal history of urinary (tract) infections: Secondary | ICD-10-CM | POA: Diagnosis not present

## 2023-06-05 DIAGNOSIS — E876 Hypokalemia: Secondary | ICD-10-CM | POA: Diagnosis not present

## 2023-06-09 DIAGNOSIS — J189 Pneumonia, unspecified organism: Secondary | ICD-10-CM | POA: Diagnosis not present

## 2023-06-09 DIAGNOSIS — G9349 Other encephalopathy: Secondary | ICD-10-CM | POA: Diagnosis not present

## 2023-06-09 DIAGNOSIS — M4856XD Collapsed vertebra, not elsewhere classified, lumbar region, subsequent encounter for fracture with routine healing: Secondary | ICD-10-CM | POA: Diagnosis not present

## 2023-06-09 DIAGNOSIS — M4854XD Collapsed vertebra, not elsewhere classified, thoracic region, subsequent encounter for fracture with routine healing: Secondary | ICD-10-CM | POA: Diagnosis not present

## 2023-06-09 DIAGNOSIS — J9601 Acute respiratory failure with hypoxia: Secondary | ICD-10-CM | POA: Diagnosis not present

## 2023-06-09 DIAGNOSIS — A419 Sepsis, unspecified organism: Secondary | ICD-10-CM | POA: Diagnosis not present

## 2023-06-11 ENCOUNTER — Ambulatory Visit: Payer: BLUE CROSS/BLUE SHIELD | Admitting: Cardiology

## 2023-06-15 DIAGNOSIS — A419 Sepsis, unspecified organism: Secondary | ICD-10-CM | POA: Diagnosis not present

## 2023-06-15 DIAGNOSIS — J9601 Acute respiratory failure with hypoxia: Secondary | ICD-10-CM | POA: Diagnosis not present

## 2023-06-15 DIAGNOSIS — J189 Pneumonia, unspecified organism: Secondary | ICD-10-CM | POA: Diagnosis not present

## 2023-06-15 DIAGNOSIS — M4854XD Collapsed vertebra, not elsewhere classified, thoracic region, subsequent encounter for fracture with routine healing: Secondary | ICD-10-CM | POA: Diagnosis not present

## 2023-06-15 DIAGNOSIS — M4856XD Collapsed vertebra, not elsewhere classified, lumbar region, subsequent encounter for fracture with routine healing: Secondary | ICD-10-CM | POA: Diagnosis not present

## 2023-06-15 DIAGNOSIS — G9349 Other encephalopathy: Secondary | ICD-10-CM | POA: Diagnosis not present

## 2023-06-23 DIAGNOSIS — A419 Sepsis, unspecified organism: Secondary | ICD-10-CM | POA: Diagnosis not present

## 2023-06-23 DIAGNOSIS — J189 Pneumonia, unspecified organism: Secondary | ICD-10-CM | POA: Diagnosis not present

## 2023-06-23 DIAGNOSIS — M4856XD Collapsed vertebra, not elsewhere classified, lumbar region, subsequent encounter for fracture with routine healing: Secondary | ICD-10-CM | POA: Diagnosis not present

## 2023-06-23 DIAGNOSIS — J9601 Acute respiratory failure with hypoxia: Secondary | ICD-10-CM | POA: Diagnosis not present

## 2023-06-23 DIAGNOSIS — M4854XD Collapsed vertebra, not elsewhere classified, thoracic region, subsequent encounter for fracture with routine healing: Secondary | ICD-10-CM | POA: Diagnosis not present

## 2023-06-23 DIAGNOSIS — G9349 Other encephalopathy: Secondary | ICD-10-CM | POA: Diagnosis not present

## 2023-06-25 ENCOUNTER — Other Ambulatory Visit: Payer: Self-pay | Admitting: Cardiology

## 2023-06-25 DIAGNOSIS — I480 Paroxysmal atrial fibrillation: Secondary | ICD-10-CM

## 2023-06-25 NOTE — Telephone Encounter (Signed)
Prescription refill request for Eliquis received. Indication: Afib  Last office visit: 05/08/23 ([name])  Scr: 0.84 (05/01/23)  Age: 89 Weight: 50.8kg  Appropriate dose. Refill sent.

## 2023-06-29 DIAGNOSIS — M4854XD Collapsed vertebra, not elsewhere classified, thoracic region, subsequent encounter for fracture with routine healing: Secondary | ICD-10-CM | POA: Diagnosis not present

## 2023-06-29 DIAGNOSIS — J189 Pneumonia, unspecified organism: Secondary | ICD-10-CM | POA: Diagnosis not present

## 2023-06-29 DIAGNOSIS — A419 Sepsis, unspecified organism: Secondary | ICD-10-CM | POA: Diagnosis not present

## 2023-06-29 DIAGNOSIS — M4856XD Collapsed vertebra, not elsewhere classified, lumbar region, subsequent encounter for fracture with routine healing: Secondary | ICD-10-CM | POA: Diagnosis not present

## 2023-06-29 DIAGNOSIS — G9349 Other encephalopathy: Secondary | ICD-10-CM | POA: Diagnosis not present

## 2023-06-29 DIAGNOSIS — J9601 Acute respiratory failure with hypoxia: Secondary | ICD-10-CM | POA: Diagnosis not present

## 2023-07-23 DIAGNOSIS — Z6824 Body mass index (BMI) 24.0-24.9, adult: Secondary | ICD-10-CM | POA: Diagnosis not present

## 2023-07-23 DIAGNOSIS — D508 Other iron deficiency anemias: Secondary | ICD-10-CM | POA: Diagnosis not present

## 2023-07-23 DIAGNOSIS — E538 Deficiency of other specified B group vitamins: Secondary | ICD-10-CM | POA: Diagnosis not present

## 2023-07-23 DIAGNOSIS — B353 Tinea pedis: Secondary | ICD-10-CM | POA: Diagnosis not present

## 2023-07-23 DIAGNOSIS — Z2911 Encounter for prophylactic immunotherapy for respiratory syncytial virus (RSV): Secondary | ICD-10-CM | POA: Diagnosis not present

## 2023-07-23 DIAGNOSIS — Z79899 Other long term (current) drug therapy: Secondary | ICD-10-CM | POA: Diagnosis not present

## 2023-07-23 DIAGNOSIS — I10 Essential (primary) hypertension: Secondary | ICD-10-CM | POA: Diagnosis not present

## 2023-07-23 DIAGNOSIS — M810 Age-related osteoporosis without current pathological fracture: Secondary | ICD-10-CM | POA: Diagnosis not present

## 2023-07-23 DIAGNOSIS — G3184 Mild cognitive impairment, so stated: Secondary | ICD-10-CM | POA: Diagnosis not present

## 2023-07-23 DIAGNOSIS — I4891 Unspecified atrial fibrillation: Secondary | ICD-10-CM | POA: Diagnosis not present

## 2023-07-23 DIAGNOSIS — J90 Pleural effusion, not elsewhere classified: Secondary | ICD-10-CM | POA: Diagnosis not present

## 2023-08-17 ENCOUNTER — Other Ambulatory Visit: Payer: Self-pay | Admitting: Internal Medicine

## 2023-08-17 DIAGNOSIS — I480 Paroxysmal atrial fibrillation: Secondary | ICD-10-CM

## 2023-08-17 NOTE — Telephone Encounter (Signed)
Eliquis 2.5mg  refill request received. Patient is 89 years old, weight-50.8kg, Crea-0.84 on 05/01/23, Diagnosis-Afib, and last seen by Dr. Rosemary Holms on 06/01/23 via televisit. Dose is appropriate based on dosing criteria. Will send in refill to requested pharmacy.

## 2023-08-24 DIAGNOSIS — C672 Malignant neoplasm of lateral wall of bladder: Secondary | ICD-10-CM | POA: Diagnosis not present

## 2023-08-24 DIAGNOSIS — R8290 Unspecified abnormal findings in urine: Secondary | ICD-10-CM | POA: Diagnosis not present

## 2023-09-03 DIAGNOSIS — C672 Malignant neoplasm of lateral wall of bladder: Secondary | ICD-10-CM | POA: Diagnosis not present

## 2023-09-11 DIAGNOSIS — C672 Malignant neoplasm of lateral wall of bladder: Secondary | ICD-10-CM | POA: Diagnosis not present

## 2023-09-11 DIAGNOSIS — R8290 Unspecified abnormal findings in urine: Secondary | ICD-10-CM | POA: Diagnosis not present

## 2023-10-02 DIAGNOSIS — C672 Malignant neoplasm of lateral wall of bladder: Secondary | ICD-10-CM | POA: Diagnosis not present

## 2023-10-06 DIAGNOSIS — E538 Deficiency of other specified B group vitamins: Secondary | ICD-10-CM | POA: Diagnosis not present

## 2023-10-06 DIAGNOSIS — M818 Other osteoporosis without current pathological fracture: Secondary | ICD-10-CM | POA: Diagnosis not present

## 2023-10-13 DIAGNOSIS — C672 Malignant neoplasm of lateral wall of bladder: Secondary | ICD-10-CM | POA: Diagnosis not present

## 2023-10-14 DIAGNOSIS — Z6824 Body mass index (BMI) 24.0-24.9, adult: Secondary | ICD-10-CM | POA: Diagnosis not present

## 2023-10-14 DIAGNOSIS — I4891 Unspecified atrial fibrillation: Secondary | ICD-10-CM | POA: Diagnosis not present

## 2023-10-14 DIAGNOSIS — I10 Essential (primary) hypertension: Secondary | ICD-10-CM | POA: Diagnosis not present

## 2023-10-14 DIAGNOSIS — E538 Deficiency of other specified B group vitamins: Secondary | ICD-10-CM | POA: Diagnosis not present

## 2023-10-14 DIAGNOSIS — M810 Age-related osteoporosis without current pathological fracture: Secondary | ICD-10-CM | POA: Diagnosis not present

## 2023-10-14 DIAGNOSIS — D508 Other iron deficiency anemias: Secondary | ICD-10-CM | POA: Diagnosis not present

## 2023-10-14 DIAGNOSIS — G3184 Mild cognitive impairment, so stated: Secondary | ICD-10-CM | POA: Diagnosis not present

## 2023-10-14 DIAGNOSIS — Z23 Encounter for immunization: Secondary | ICD-10-CM | POA: Diagnosis not present

## 2023-10-23 DIAGNOSIS — Z23 Encounter for immunization: Secondary | ICD-10-CM | POA: Diagnosis not present

## 2023-11-09 DIAGNOSIS — Z6824 Body mass index (BMI) 24.0-24.9, adult: Secondary | ICD-10-CM | POA: Diagnosis not present

## 2023-11-09 DIAGNOSIS — R350 Frequency of micturition: Secondary | ICD-10-CM | POA: Diagnosis not present

## 2023-11-09 DIAGNOSIS — R410 Disorientation, unspecified: Secondary | ICD-10-CM | POA: Diagnosis not present

## 2023-11-09 DIAGNOSIS — R319 Hematuria, unspecified: Secondary | ICD-10-CM | POA: Diagnosis not present

## 2023-11-20 DIAGNOSIS — Z87440 Personal history of urinary (tract) infections: Secondary | ICD-10-CM | POA: Diagnosis not present

## 2023-11-20 DIAGNOSIS — R5381 Other malaise: Secondary | ICD-10-CM | POA: Diagnosis not present

## 2023-11-20 DIAGNOSIS — K5900 Constipation, unspecified: Secondary | ICD-10-CM | POA: Diagnosis not present

## 2023-11-20 DIAGNOSIS — R051 Acute cough: Secondary | ICD-10-CM | POA: Diagnosis not present

## 2023-11-20 DIAGNOSIS — R5383 Other fatigue: Secondary | ICD-10-CM | POA: Diagnosis not present

## 2023-12-08 DIAGNOSIS — R8290 Unspecified abnormal findings in urine: Secondary | ICD-10-CM | POA: Diagnosis not present

## 2023-12-08 DIAGNOSIS — C679 Malignant neoplasm of bladder, unspecified: Secondary | ICD-10-CM | POA: Diagnosis not present

## 2023-12-11 DIAGNOSIS — I10 Essential (primary) hypertension: Secondary | ICD-10-CM | POA: Diagnosis not present

## 2023-12-11 DIAGNOSIS — I4891 Unspecified atrial fibrillation: Secondary | ICD-10-CM | POA: Diagnosis not present

## 2023-12-11 DIAGNOSIS — I480 Paroxysmal atrial fibrillation: Secondary | ICD-10-CM | POA: Diagnosis not present

## 2023-12-11 DIAGNOSIS — I509 Heart failure, unspecified: Secondary | ICD-10-CM | POA: Diagnosis not present

## 2023-12-20 ENCOUNTER — Encounter (HOSPITAL_BASED_OUTPATIENT_CLINIC_OR_DEPARTMENT_OTHER): Payer: Self-pay

## 2023-12-20 ENCOUNTER — Emergency Department (HOSPITAL_BASED_OUTPATIENT_CLINIC_OR_DEPARTMENT_OTHER)
Admission: EM | Admit: 2023-12-20 | Discharge: 2023-12-20 | Disposition: A | Discharge status: Home / Self Care | Attending: Emergency Medicine | Admitting: Emergency Medicine

## 2023-12-20 ENCOUNTER — Other Ambulatory Visit: Payer: Self-pay

## 2023-12-20 ENCOUNTER — Emergency Department (HOSPITAL_BASED_OUTPATIENT_CLINIC_OR_DEPARTMENT_OTHER)

## 2023-12-20 DIAGNOSIS — N3000 Acute cystitis without hematuria: Secondary | ICD-10-CM | POA: Diagnosis not present

## 2023-12-20 DIAGNOSIS — Z8551 Personal history of malignant neoplasm of bladder: Secondary | ICD-10-CM | POA: Diagnosis not present

## 2023-12-20 DIAGNOSIS — Z79899 Other long term (current) drug therapy: Secondary | ICD-10-CM | POA: Diagnosis not present

## 2023-12-20 DIAGNOSIS — N134 Hydroureter: Secondary | ICD-10-CM

## 2023-12-20 DIAGNOSIS — N200 Calculus of kidney: Secondary | ICD-10-CM | POA: Diagnosis not present

## 2023-12-20 DIAGNOSIS — N132 Hydronephrosis with renal and ureteral calculous obstruction: Secondary | ICD-10-CM | POA: Insufficient documentation

## 2023-12-20 DIAGNOSIS — N281 Cyst of kidney, acquired: Secondary | ICD-10-CM | POA: Diagnosis not present

## 2023-12-20 DIAGNOSIS — Z7901 Long term (current) use of anticoagulants: Secondary | ICD-10-CM | POA: Diagnosis not present

## 2023-12-20 DIAGNOSIS — N2889 Other specified disorders of kidney and ureter: Secondary | ICD-10-CM

## 2023-12-20 DIAGNOSIS — N1330 Unspecified hydronephrosis: Secondary | ICD-10-CM | POA: Diagnosis not present

## 2023-12-20 DIAGNOSIS — Z91040 Latex allergy status: Secondary | ICD-10-CM | POA: Diagnosis not present

## 2023-12-20 DIAGNOSIS — I10 Essential (primary) hypertension: Secondary | ICD-10-CM | POA: Diagnosis not present

## 2023-12-20 DIAGNOSIS — R1032 Left lower quadrant pain: Secondary | ICD-10-CM | POA: Diagnosis present

## 2023-12-20 DIAGNOSIS — N8110 Cystocele, unspecified: Secondary | ICD-10-CM | POA: Diagnosis not present

## 2023-12-20 LAB — URINALYSIS, W/ REFLEX TO CULTURE (INFECTION SUSPECTED)
Bilirubin Urine: NEGATIVE
Glucose, UA: NEGATIVE mg/dL
Ketones, ur: NEGATIVE mg/dL
Nitrite: POSITIVE — AB
Protein, ur: 30 mg/dL — AB
Specific Gravity, Urine: 1.008 (ref 1.005–1.030)
WBC, UA: >50 WBC/hpf (ref 0–5)
pH: 7 (ref 5.0–8.0)

## 2023-12-20 LAB — COMPREHENSIVE METABOLIC PANEL WITH GFR
ALT: 10 U/L (ref 0–44)
AST: 21 U/L (ref 15–41)
Albumin: 4.4 g/dL (ref 3.5–5.0)
Alkaline Phosphatase: 78 U/L (ref 38–126)
Anion gap: 10 (ref 5–15)
BUN: 23 mg/dL (ref 8–23)
CO2: 30 mmol/L (ref 22–32)
Calcium: 10.1 mg/dL (ref 8.9–10.3)
Chloride: 96 mmol/L — ABNORMAL LOW (ref 98–111)
Creatinine, Ser: 1.02 mg/dL — ABNORMAL HIGH (ref 0.44–1.00)
GFR, Estimated: 52 mL/min — ABNORMAL LOW (ref >60)
Glucose, Bld: 134 mg/dL — ABNORMAL HIGH (ref 70–99)
Potassium: 4.2 mmol/L (ref 3.5–5.1)
Sodium: 136 mmol/L (ref 135–145)
Total Bilirubin: 0.4 mg/dL (ref 0.0–1.2)
Total Protein: 8.2 g/dL — ABNORMAL HIGH (ref 6.5–8.1)

## 2023-12-20 LAB — CBC WITH DIFFERENTIAL/PLATELET
Abs Immature Granulocytes: 0.02 10*3/uL (ref 0.00–0.07)
Basophils Absolute: 0 10*3/uL (ref 0.0–0.1)
Basophils Relative: 0 %
Eosinophils Absolute: 0 10*3/uL (ref 0.0–0.5)
Eosinophils Relative: 0 %
HCT: 43.2 % (ref 36.0–46.0)
Hemoglobin: 14.1 g/dL (ref 12.0–15.0)
Immature Granulocytes: 0 %
Lymphocytes Relative: 5 %
Lymphs Abs: 0.6 10*3/uL — ABNORMAL LOW (ref 0.7–4.0)
MCH: 29.4 pg (ref 26.0–34.0)
MCHC: 32.6 g/dL (ref 30.0–36.0)
MCV: 90.2 fL (ref 80.0–100.0)
Monocytes Absolute: 0.5 10*3/uL (ref 0.1–1.0)
Monocytes Relative: 4 %
Neutro Abs: 11.8 10*3/uL — ABNORMAL HIGH (ref 1.7–7.7)
Neutrophils Relative %: 91 %
Platelets: 198 10*3/uL (ref 150–400)
RBC: 4.79 MIL/uL (ref 3.87–5.11)
RDW: 13.8 % (ref 11.5–15.5)
WBC: 13 10*3/uL — ABNORMAL HIGH (ref 4.0–10.5)
nRBC: 0 % (ref 0.0–0.2)

## 2023-12-20 LAB — LIPASE, BLOOD: Lipase: 40 U/L (ref 11–51)

## 2023-12-20 MED ORDER — IOHEXOL 300 MG/ML  SOLN
100.0000 mL | Freq: Once | INTRAMUSCULAR | Status: AC | PRN
  Administered 2023-12-20: 100 mL via INTRAVENOUS

## 2023-12-20 MED ORDER — NITROFURANTOIN MONOHYD MACRO 100 MG PO CAPS: 100.0000 mg | ORAL_CAPSULE | Freq: Two times a day (BID) | ORAL | 0 refills | Status: AC

## 2023-12-20 MED ORDER — SODIUM CHLORIDE 0.9 % IV SOLN
1.0000 g | Freq: Once | INTRAVENOUS | Status: AC
  Administered 2023-12-20: 1 g via INTRAVENOUS

## 2023-12-20 NOTE — ED Notes (Signed)
 Pt cleaned ,incontinent of urine, changed clothes/depends/

## 2023-12-20 NOTE — Discharge Instructions (Signed)
 Call Dr. Hendricks Limes office in the morning and let them know that you were told that you likely will need a nephrostomy tube placed.  Return to emergency room if you have any worsening symptoms such as confusion, fever, vomiting, increased weakness or other worsening symptoms.

## 2023-12-20 NOTE — ED Provider Notes (Signed)
 [place] [facility]PARTMENT AT [facility] Provider Note   CSN: [id number] Arrival date & time: 12/20/23  1404     History  Chief Complaint  Patient presents with   Abdominal Pain    LLQ    CLAUDETTA SALLIE is a 89 y.o. female.  Patient is a 89 year old female who presents with abdominal pain.  She has a history of hypertension, atrial fibrillation, mild cognitive dysfunction and bladder cancer.  She was seen by her urologist on March 18.  Her urine looks suspicious for urinary tract infection.  She was started on Macrobid.  Was supposed to be for 2 weeks but the family states that she got a 5-day supply from the office and was post to get a prescription for the rest of the 2 weeks but they were not able to get that from the pharmacy.  She said they did not ever have a prescription in the pharmacy and she was unable to get a hold of someone about it.  The patient was supposed to be having a cystoscopy on that day but it was delayed due to the UTI.  She started having some pain in her left lower abdomen today.  Some associated pain in her left lower back.  It is not really worse with movement.  She denies any nausea or vomiting.  No urinary symptoms.  No change in her stools.  No known fevers.       Home Medications Prior to Admission medications   Medication Sig Start Date End Date Taking? Authorizing Provider  acetaminophen (TYLENOL) 500 MG tablet Take 500 mg by mouth every 6 (six) hours as needed for moderate pain.    [provider]  apixaban (ELIQUIS) 2.5 MG TABS tablet TAKE 1 TABLET(2.5 MG) BY MOUTH TWICE DAILY 08/17/23   [name], Manish J, MD  Cholecalciferol (VITAMIN D3) 10 MCG (400 UNIT) CAPS Take 400 Units by mouth 2 (two) times daily.    [provider]  cyanocobalamin (VITAMIN B12) 1000 MCG/ML injection Inject 1,000 mcg into the muscle every 30 (thirty) days.    [provider]  diltiazem (CARDIZEM CD) 120 MG 24 hr capsule Take 1  capsule (120 mg total) by mouth daily. 06/01/23   [name], [name], MD  feeding supplement (ENSURE ENLIVE / ENSURE PLUS) LIQD Take 237 mLs by mouth 3 (three) times daily between meals. 05/01/23   Elgergawy, Leana Roe, MD  ferrous sulfate 325 (65 FE) MG tablet Take 1 tablet (325 mg total) by mouth every 3 (three) days. 01/02/23   Rodolph Bong, MD  furosemide (LASIX) 40 MG tablet Take 1 tablet (40 mg total) by mouth daily. 06/01/23 05/31/24  [name], [name], MD  guaiFENesin-dextromethorphan (ROBITUSSIN DM) 100-10 MG/5ML syrup Take 5 mLs by mouth every 4 (four) hours as needed for cough (chest congestion). Patient not taking: Reported on 06/01/2023 04/24/23   Willeen Niece, MD  ketoconazole (NIZORAL) 2 % cream Apply 1 Application topically daily as needed for irritation. Apply to toes Patient not taking: Reported on 06/01/2023    [provider]  memantine (NAMENDA) 10 MG tablet Take 10 mg by mouth 2 (two) times daily.    [provider]  OXYGEN Inhale into the lungs.    [provider]  Saccharomyces boulardii (PROBIOTIC) 250 MG CAPS Take 250 mg by mouth daily.    [provider]  senna-docusate (SENOKOT-S) 8.6-50 MG tablet Take 1 tablet by mouth 2 (two) times daily.    [provider]      Allergies    Aspirin, Cipro [ciprofloxacin hcl], Doxycycline, Fish allergy, Nsaids, Shellfish-derived products, Aloe, Latex, and Sulfa antibiotics    Review of Systems   Review of Systems  Constitutional:  Positive for fatigue. Negative for chills, diaphoresis and fever.  HENT:  Negative for congestion, rhinorrhea and sneezing.   Eyes: Negative.   Respiratory:  Negative for cough, chest tightness and shortness of breath.   Cardiovascular:  Negative for chest pain and leg swelling.  Gastrointestinal:  Positive for abdominal pain. Negative for blood in stool, diarrhea, nausea and vomiting.  Genitourinary:  Negative for difficulty urinating, flank pain, frequency  and hematuria.  Musculoskeletal:  Positive for back pain. Negative for arthralgias.  Skin:  Negative for rash.  Neurological:  Negative for dizziness, speech difficulty, weakness, numbness and headaches.    Physical Exam Updated Vital Signs BP (!) 156/93   Pulse 92   Temp 98.1 F (36.7 C)   Resp 16   SpO2 98%  Physical Exam Constitutional:      Appearance: She is well-developed.  HENT:     Head: Normocephalic and atraumatic.  Eyes:     Pupils: Pupils are equal, round, and reactive to light.  Cardiovascular:     Rate and Rhythm: Normal rate and regular rhythm.     Heart sounds: Normal heart sounds.  Pulmonary:     Effort: Pulmonary effort is normal. No respiratory distress.     Breath sounds: Normal breath sounds. No wheezing or rales.  Chest:     Chest wall: No tenderness.  Abdominal:     General: Bowel sounds are normal.     Palpations: Abdomen is soft.     Tenderness: There is abdominal tenderness in the left lower quadrant. There is no guarding or rebound.  Musculoskeletal:        General: Normal range of motion.     Cervical back: Normal range of motion and neck supple.     Comments: Mild tenderness in the right lower back in the musculature.  No spinal tenderness.  Lymphadenopathy:     Cervical: No cervical adenopathy.  Skin:    General: Skin is warm and dry.     Findings: No rash.  Neurological:     Mental Status: She is alert and oriented to person, place, and time.     ED Results / Procedures / Treatments   Labs (all labs ordered are listed, but only abnormal results are displayed) Labs Reviewed  COMPREHENSIVE METABOLIC PANEL WITH GFR  CBC WITH DIFFERENTIAL/PLATELET  LIPASE, BLOOD  URINALYSIS, W/ REFLEX TO CULTURE (INFECTION SUSPECTED)    EKG None  Radiology No results found.  Procedures Procedures    Medications Ordered in ED Medications - No data to display  ED Course/ Medical Decision Making/ A&P                                  Medical Decision Making Amount and/or Complexity of Data Reviewed Labs: ordered. Radiology: ordered.  Risk Prescription drug management.   Patient is a 89 year old female who presents with left flank pain.  She has evidence of a UTI on her urinalysis.  Her white count is a bit elevated at 13,000.  She is afebrile here.  She has not had any nausea vomiting or increased confusion per family at bedside.  Her CT scan shows evidence of left-sided hydro ureter and hydronephrosis.  There is  also concern for a mass in the left kidney and possible calyceal rupture.  She is currently being treated for bladder cancer.  She also had a procedure where she had a mass scraped out of the ureter at the UVJ area.  This was per the family's report.  Patient is overall well-appearing.  I discussed these findings with Dr. Ronne Binning with urology.  He reviewed the patient's labs as well as the CT scans.  I advised him that she has this left flank pain and has recently been treated for this UTI.  She only took 5 days worth of the Macrobid and finished it a week ago.  Dr. Ronne Binning advises that patient will likely need a left nephrostomy tube.  He does not feel that patient needs to be admitted to the hospital and can follow-up with Dr. Cleatrice Burke who is her urologist with close outpatient follow-up.  Discussed these findings with the family and patient.  She was given a dose of cefepime here in the ED.  I reviewed her recent culture results from her culture on the 18th of this month.  It is susceptible to cefepime and also Macrobid.  Will give her another weeks worth of Macrobid.  They will call Dr. Hendricks Limes office in the morning.  She was given strict return precautions.  Final Clinical Impression(s) / ED Diagnoses Final diagnoses:  None    Rx / DC Orders ED Discharge Orders     None         Rolan Bucco, MD 12/20/23 1925

## 2023-12-20 NOTE — ED Triage Notes (Signed)
 Pt c/o LLQ abd pain, backache onset "as best we can tell" yesterday.  Week before last, bladder scope was delayed r/t urine culture results- gram+. Compliant w prescribed macrobid x5 days, family is concerned "maybe she didn't get enough."

## 2023-12-21 DIAGNOSIS — I10 Essential (primary) hypertension: Secondary | ICD-10-CM | POA: Diagnosis not present

## 2023-12-21 DIAGNOSIS — I509 Heart failure, unspecified: Secondary | ICD-10-CM | POA: Diagnosis not present

## 2023-12-21 DIAGNOSIS — I4891 Unspecified atrial fibrillation: Secondary | ICD-10-CM | POA: Diagnosis not present

## 2023-12-21 DIAGNOSIS — I480 Paroxysmal atrial fibrillation: Secondary | ICD-10-CM | POA: Diagnosis not present

## 2023-12-22 LAB — URINE CULTURE: Culture: >=100000 — AB

## 2023-12-23 ENCOUNTER — Telehealth (HOSPITAL_BASED_OUTPATIENT_CLINIC_OR_DEPARTMENT_OTHER): Payer: Self-pay | Admitting: *Deleted

## 2023-12-23 NOTE — Telephone Encounter (Signed)
 Post ED Visit - Positive Culture Follow-up  Culture report reviewed by antimicrobial stewardship pharmacist: [facility] [x]  Ivery Quale, [place].D. []  Celedonio Miyamoto, Pharm.D., BCPS AQ-ID []  Garvin Fila, Pharm.D., BCPS []  Georgina Pillion, Pharm.D., BCPS []  Annetta North, [place].D., BCPS, AAHIVP []  Estella Husk, Pharm.D., BCPS, AAHIVP []  Lysle Pearl, PharmD, BCPS []  Phillips Climes, PharmD, BCPS []  Agapito Games, PharmD, BCPS []  Verlan Friends, PharmD []  Mervyn Gay, PharmD, BCPS []  Vinnie Level, PharmD  [facility] []  Len Childs, PharmD []  Greer Pickerel, PharmD []  Adalberto Cole, PharmD []  Perlie Gold, Rph []  Lonell Face) Jean Rosenthal, PharmD []  Earl Many, PharmD []  Junita Push, PharmD []  Dorna Leitz, PharmD []  Terrilee Files, PharmD []  Lynann Beaver, PharmD []  Keturah Barre, PharmD []  Loralee Pacas, PharmD []  Bernadene Person, PharmD   Positive urine culture Treated with Nitrofurantoin Monohyd Macro, organism sensitive to the same and no further patient follow-up is required at this time.  Bing Quarry 12/23/2023, 7:47 AM

## 2023-12-29 DIAGNOSIS — Z79899 Other long term (current) drug therapy: Secondary | ICD-10-CM | POA: Diagnosis not present

## 2023-12-29 DIAGNOSIS — F01B0 Vascular dementia, moderate, without behavioral disturbance, psychotic disturbance, mood disturbance, and anxiety: Secondary | ICD-10-CM | POA: Diagnosis not present

## 2023-12-29 DIAGNOSIS — E538 Deficiency of other specified B group vitamins: Secondary | ICD-10-CM | POA: Diagnosis not present

## 2024-01-04 DIAGNOSIS — K5900 Constipation, unspecified: Secondary | ICD-10-CM | POA: Diagnosis not present

## 2024-01-04 DIAGNOSIS — I4819 Other persistent atrial fibrillation: Secondary | ICD-10-CM | POA: Diagnosis not present

## 2024-01-04 DIAGNOSIS — I2129 ST elevation (STEMI) myocardial infarction involving other sites: Secondary | ICD-10-CM | POA: Diagnosis not present

## 2024-01-04 DIAGNOSIS — A4151 Sepsis due to Escherichia coli [E. coli]: Secondary | ICD-10-CM | POA: Diagnosis not present

## 2024-01-04 DIAGNOSIS — I213 ST elevation (STEMI) myocardial infarction of unspecified site: Secondary | ICD-10-CM | POA: Diagnosis not present

## 2024-01-04 DIAGNOSIS — R918 Other nonspecific abnormal finding of lung field: Secondary | ICD-10-CM | POA: Diagnosis not present

## 2024-01-04 DIAGNOSIS — R6521 Severe sepsis with septic shock: Secondary | ICD-10-CM | POA: Diagnosis not present

## 2024-01-04 DIAGNOSIS — B9620 Unspecified Escherichia coli [E. coli] as the cause of diseases classified elsewhere: Secondary | ICD-10-CM | POA: Diagnosis present

## 2024-01-04 DIAGNOSIS — I10 Essential (primary) hypertension: Secondary | ICD-10-CM | POA: Diagnosis not present

## 2024-01-04 DIAGNOSIS — N179 Acute kidney failure, unspecified: Secondary | ICD-10-CM | POA: Diagnosis not present

## 2024-01-04 DIAGNOSIS — I498 Other specified cardiac arrhythmias: Secondary | ICD-10-CM | POA: Diagnosis not present

## 2024-01-04 DIAGNOSIS — I509 Heart failure, unspecified: Secondary | ICD-10-CM | POA: Diagnosis not present

## 2024-01-04 DIAGNOSIS — I959 Hypotension, unspecified: Secondary | ICD-10-CM | POA: Diagnosis not present

## 2024-01-04 DIAGNOSIS — I252 Old myocardial infarction: Secondary | ICD-10-CM | POA: Diagnosis not present

## 2024-01-04 DIAGNOSIS — I440 Atrioventricular block, first degree: Secondary | ICD-10-CM | POA: Diagnosis not present

## 2024-01-04 DIAGNOSIS — Z853 Personal history of malignant neoplasm of breast: Secondary | ICD-10-CM | POA: Diagnosis not present

## 2024-01-04 DIAGNOSIS — E875 Hyperkalemia: Secondary | ICD-10-CM | POA: Diagnosis present

## 2024-01-04 DIAGNOSIS — I2119 ST elevation (STEMI) myocardial infarction involving other coronary artery of inferior wall: Secondary | ICD-10-CM | POA: Diagnosis not present

## 2024-01-04 DIAGNOSIS — K5730 Diverticulosis of large intestine without perforation or abscess without bleeding: Secondary | ICD-10-CM | POA: Diagnosis not present

## 2024-01-04 DIAGNOSIS — Z87891 Personal history of nicotine dependence: Secondary | ICD-10-CM | POA: Diagnosis not present

## 2024-01-04 DIAGNOSIS — Z148 Genetic carrier of other disease: Secondary | ICD-10-CM | POA: Diagnosis not present

## 2024-01-04 DIAGNOSIS — N1330 Unspecified hydronephrosis: Secondary | ICD-10-CM | POA: Diagnosis not present

## 2024-01-04 DIAGNOSIS — G3184 Mild cognitive impairment, so stated: Secondary | ICD-10-CM | POA: Diagnosis present

## 2024-01-04 DIAGNOSIS — J811 Chronic pulmonary edema: Secondary | ICD-10-CM | POA: Diagnosis not present

## 2024-01-04 DIAGNOSIS — I2111 ST elevation (STEMI) myocardial infarction involving right coronary artery: Secondary | ICD-10-CM | POA: Diagnosis not present

## 2024-01-04 DIAGNOSIS — R079 Chest pain, unspecified: Secondary | ICD-10-CM | POA: Diagnosis not present

## 2024-01-04 DIAGNOSIS — Z79899 Other long term (current) drug therapy: Secondary | ICD-10-CM | POA: Diagnosis not present

## 2024-01-04 DIAGNOSIS — Z7982 Long term (current) use of aspirin: Secondary | ICD-10-CM | POA: Diagnosis not present

## 2024-01-04 DIAGNOSIS — N10 Acute tubulo-interstitial nephritis: Secondary | ICD-10-CM | POA: Diagnosis not present

## 2024-01-04 DIAGNOSIS — R109 Unspecified abdominal pain: Secondary | ICD-10-CM | POA: Diagnosis not present

## 2024-01-04 DIAGNOSIS — C679 Malignant neoplasm of bladder, unspecified: Secondary | ICD-10-CM | POA: Diagnosis present

## 2024-01-04 DIAGNOSIS — R9431 Abnormal electrocardiogram [ECG] [EKG]: Secondary | ICD-10-CM | POA: Diagnosis not present

## 2024-01-04 DIAGNOSIS — N3000 Acute cystitis without hematuria: Secondary | ICD-10-CM | POA: Diagnosis not present

## 2024-01-04 DIAGNOSIS — K449 Diaphragmatic hernia without obstruction or gangrene: Secondary | ICD-10-CM | POA: Diagnosis not present

## 2024-01-04 DIAGNOSIS — Z955 Presence of coronary angioplasty implant and graft: Secondary | ICD-10-CM | POA: Diagnosis not present

## 2024-01-04 DIAGNOSIS — J449 Chronic obstructive pulmonary disease, unspecified: Secondary | ICD-10-CM | POA: Diagnosis present

## 2024-01-04 DIAGNOSIS — I480 Paroxysmal atrial fibrillation: Secondary | ICD-10-CM | POA: Diagnosis not present

## 2024-01-04 DIAGNOSIS — I2510 Atherosclerotic heart disease of native coronary artery without angina pectoris: Secondary | ICD-10-CM | POA: Diagnosis present

## 2024-01-04 DIAGNOSIS — R0902 Hypoxemia: Secondary | ICD-10-CM | POA: Diagnosis not present

## 2024-01-04 DIAGNOSIS — R059 Cough, unspecified: Secondary | ICD-10-CM | POA: Diagnosis not present

## 2024-01-04 DIAGNOSIS — N390 Urinary tract infection, site not specified: Secondary | ICD-10-CM | POA: Diagnosis present

## 2024-01-04 DIAGNOSIS — N2882 Megaloureter: Secondary | ICD-10-CM | POA: Diagnosis not present

## 2024-01-04 DIAGNOSIS — N281 Cyst of kidney, acquired: Secondary | ICD-10-CM | POA: Diagnosis not present

## 2024-01-04 DIAGNOSIS — R579 Shock, unspecified: Secondary | ICD-10-CM | POA: Diagnosis not present

## 2024-01-04 DIAGNOSIS — Z7901 Long term (current) use of anticoagulants: Secondary | ICD-10-CM | POA: Diagnosis not present

## 2024-01-04 DIAGNOSIS — Z7902 Long term (current) use of antithrombotics/antiplatelets: Secondary | ICD-10-CM | POA: Diagnosis not present

## 2024-01-04 DIAGNOSIS — J9611 Chronic respiratory failure with hypoxia: Secondary | ICD-10-CM | POA: Diagnosis present

## 2024-01-04 DIAGNOSIS — R0989 Other specified symptoms and signs involving the circulatory and respiratory systems: Secondary | ICD-10-CM | POA: Diagnosis not present

## 2024-01-04 DIAGNOSIS — Z87440 Personal history of urinary (tract) infections: Secondary | ICD-10-CM | POA: Diagnosis not present

## 2024-01-04 DIAGNOSIS — Z66 Do not resuscitate: Secondary | ICD-10-CM | POA: Diagnosis not present

## 2024-01-04 DIAGNOSIS — I4891 Unspecified atrial fibrillation: Secondary | ICD-10-CM | POA: Diagnosis not present

## 2024-01-05 DIAGNOSIS — I2119 ST elevation (STEMI) myocardial infarction involving other coronary artery of inferior wall: Secondary | ICD-10-CM | POA: Diagnosis not present

## 2024-01-05 DIAGNOSIS — Z87440 Personal history of urinary (tract) infections: Secondary | ICD-10-CM | POA: Diagnosis not present

## 2024-01-05 DIAGNOSIS — I2129 ST elevation (STEMI) myocardial infarction involving other sites: Secondary | ICD-10-CM | POA: Diagnosis not present

## 2024-01-05 DIAGNOSIS — I252 Old myocardial infarction: Secondary | ICD-10-CM | POA: Diagnosis not present

## 2024-01-05 DIAGNOSIS — R9431 Abnormal electrocardiogram [ECG] [EKG]: Secondary | ICD-10-CM | POA: Diagnosis not present

## 2024-01-05 DIAGNOSIS — A4151 Sepsis due to Escherichia coli [E. coli]: Secondary | ICD-10-CM | POA: Diagnosis not present

## 2024-01-05 DIAGNOSIS — Z955 Presence of coronary angioplasty implant and graft: Secondary | ICD-10-CM | POA: Diagnosis not present

## 2024-01-05 DIAGNOSIS — I959 Hypotension, unspecified: Secondary | ICD-10-CM | POA: Diagnosis not present

## 2024-01-05 DIAGNOSIS — G3184 Mild cognitive impairment, so stated: Secondary | ICD-10-CM | POA: Diagnosis present

## 2024-01-05 DIAGNOSIS — Z79899 Other long term (current) drug therapy: Secondary | ICD-10-CM | POA: Diagnosis not present

## 2024-01-05 DIAGNOSIS — I10 Essential (primary) hypertension: Secondary | ICD-10-CM | POA: Diagnosis present

## 2024-01-05 DIAGNOSIS — I4891 Unspecified atrial fibrillation: Secondary | ICD-10-CM | POA: Diagnosis not present

## 2024-01-05 DIAGNOSIS — Z7982 Long term (current) use of aspirin: Secondary | ICD-10-CM | POA: Diagnosis not present

## 2024-01-05 DIAGNOSIS — N281 Cyst of kidney, acquired: Secondary | ICD-10-CM | POA: Diagnosis not present

## 2024-01-05 DIAGNOSIS — I2111 ST elevation (STEMI) myocardial infarction involving right coronary artery: Secondary | ICD-10-CM | POA: Diagnosis not present

## 2024-01-05 DIAGNOSIS — Z66 Do not resuscitate: Secondary | ICD-10-CM | POA: Diagnosis not present

## 2024-01-05 DIAGNOSIS — R0989 Other specified symptoms and signs involving the circulatory and respiratory systems: Secondary | ICD-10-CM | POA: Diagnosis not present

## 2024-01-05 DIAGNOSIS — R6521 Severe sepsis with septic shock: Secondary | ICD-10-CM | POA: Diagnosis not present

## 2024-01-05 DIAGNOSIS — Z148 Genetic carrier of other disease: Secondary | ICD-10-CM | POA: Diagnosis not present

## 2024-01-05 DIAGNOSIS — Z7901 Long term (current) use of anticoagulants: Secondary | ICD-10-CM | POA: Diagnosis not present

## 2024-01-05 DIAGNOSIS — B9620 Unspecified Escherichia coli [E. coli] as the cause of diseases classified elsewhere: Secondary | ICD-10-CM | POA: Diagnosis present

## 2024-01-05 DIAGNOSIS — R079 Chest pain, unspecified: Secondary | ICD-10-CM | POA: Diagnosis not present

## 2024-01-05 DIAGNOSIS — Z87891 Personal history of nicotine dependence: Secondary | ICD-10-CM | POA: Diagnosis not present

## 2024-01-05 DIAGNOSIS — I498 Other specified cardiac arrhythmias: Secondary | ICD-10-CM | POA: Diagnosis not present

## 2024-01-05 DIAGNOSIS — I2510 Atherosclerotic heart disease of native coronary artery without angina pectoris: Secondary | ICD-10-CM | POA: Diagnosis present

## 2024-01-05 DIAGNOSIS — R0902 Hypoxemia: Secondary | ICD-10-CM | POA: Diagnosis not present

## 2024-01-05 DIAGNOSIS — I213 ST elevation (STEMI) myocardial infarction of unspecified site: Secondary | ICD-10-CM | POA: Diagnosis not present

## 2024-01-05 DIAGNOSIS — J811 Chronic pulmonary edema: Secondary | ICD-10-CM | POA: Diagnosis not present

## 2024-01-05 DIAGNOSIS — J9611 Chronic respiratory failure with hypoxia: Secondary | ICD-10-CM | POA: Diagnosis present

## 2024-01-05 DIAGNOSIS — I480 Paroxysmal atrial fibrillation: Secondary | ICD-10-CM | POA: Diagnosis present

## 2024-01-05 DIAGNOSIS — E875 Hyperkalemia: Secondary | ICD-10-CM | POA: Diagnosis present

## 2024-01-05 DIAGNOSIS — R579 Shock, unspecified: Secondary | ICD-10-CM | POA: Diagnosis not present

## 2024-01-05 DIAGNOSIS — K449 Diaphragmatic hernia without obstruction or gangrene: Secondary | ICD-10-CM | POA: Diagnosis not present

## 2024-01-05 DIAGNOSIS — C679 Malignant neoplasm of bladder, unspecified: Secondary | ICD-10-CM | POA: Diagnosis present

## 2024-01-05 DIAGNOSIS — N390 Urinary tract infection, site not specified: Secondary | ICD-10-CM | POA: Diagnosis present

## 2024-01-05 DIAGNOSIS — R918 Other nonspecific abnormal finding of lung field: Secondary | ICD-10-CM | POA: Diagnosis not present

## 2024-01-05 DIAGNOSIS — N179 Acute kidney failure, unspecified: Secondary | ICD-10-CM | POA: Diagnosis not present

## 2024-01-05 DIAGNOSIS — N10 Acute tubulo-interstitial nephritis: Secondary | ICD-10-CM | POA: Diagnosis not present

## 2024-01-05 DIAGNOSIS — N3000 Acute cystitis without hematuria: Secondary | ICD-10-CM | POA: Diagnosis not present

## 2024-01-05 DIAGNOSIS — I4819 Other persistent atrial fibrillation: Secondary | ICD-10-CM | POA: Diagnosis not present

## 2024-01-05 DIAGNOSIS — N1330 Unspecified hydronephrosis: Secondary | ICD-10-CM | POA: Diagnosis not present

## 2024-01-05 DIAGNOSIS — Z853 Personal history of malignant neoplasm of breast: Secondary | ICD-10-CM | POA: Diagnosis not present

## 2024-01-05 DIAGNOSIS — I509 Heart failure, unspecified: Secondary | ICD-10-CM | POA: Diagnosis not present

## 2024-01-05 DIAGNOSIS — Z7902 Long term (current) use of antithrombotics/antiplatelets: Secondary | ICD-10-CM | POA: Diagnosis not present

## 2024-01-05 DIAGNOSIS — I440 Atrioventricular block, first degree: Secondary | ICD-10-CM | POA: Diagnosis not present

## 2024-01-05 DIAGNOSIS — J449 Chronic obstructive pulmonary disease, unspecified: Secondary | ICD-10-CM | POA: Diagnosis present

## 2024-01-06 DIAGNOSIS — N10 Acute tubulo-interstitial nephritis: Secondary | ICD-10-CM | POA: Diagnosis not present

## 2024-01-07 DIAGNOSIS — I213 ST elevation (STEMI) myocardial infarction of unspecified site: Secondary | ICD-10-CM | POA: Diagnosis not present

## 2024-01-09 DIAGNOSIS — I10 Essential (primary) hypertension: Secondary | ICD-10-CM | POA: Diagnosis not present

## 2024-01-09 DIAGNOSIS — I4891 Unspecified atrial fibrillation: Secondary | ICD-10-CM | POA: Diagnosis not present

## 2024-01-09 DIAGNOSIS — I509 Heart failure, unspecified: Secondary | ICD-10-CM | POA: Diagnosis not present

## 2024-01-09 DIAGNOSIS — I480 Paroxysmal atrial fibrillation: Secondary | ICD-10-CM | POA: Diagnosis not present

## 2024-01-15 DIAGNOSIS — C679 Malignant neoplasm of bladder, unspecified: Secondary | ICD-10-CM | POA: Diagnosis not present

## 2024-01-15 DIAGNOSIS — N390 Urinary tract infection, site not specified: Secondary | ICD-10-CM | POA: Diagnosis not present

## 2024-01-15 DIAGNOSIS — N1330 Unspecified hydronephrosis: Secondary | ICD-10-CM | POA: Diagnosis not present

## 2024-01-15 DIAGNOSIS — R8290 Unspecified abnormal findings in urine: Secondary | ICD-10-CM | POA: Diagnosis not present

## 2024-01-16 DIAGNOSIS — Z48812 Encounter for surgical aftercare following surgery on the circulatory system: Secondary | ICD-10-CM | POA: Diagnosis not present

## 2024-01-16 DIAGNOSIS — K449 Diaphragmatic hernia without obstruction or gangrene: Secondary | ICD-10-CM | POA: Diagnosis not present

## 2024-01-16 DIAGNOSIS — B9620 Unspecified Escherichia coli [E. coli] as the cause of diseases classified elsewhere: Secondary | ICD-10-CM | POA: Diagnosis not present

## 2024-01-16 DIAGNOSIS — K219 Gastro-esophageal reflux disease without esophagitis: Secondary | ICD-10-CM | POA: Diagnosis not present

## 2024-01-16 DIAGNOSIS — K5790 Diverticulosis of intestine, part unspecified, without perforation or abscess without bleeding: Secondary | ICD-10-CM | POA: Diagnosis not present

## 2024-01-16 DIAGNOSIS — J9611 Chronic respiratory failure with hypoxia: Secondary | ICD-10-CM | POA: Diagnosis not present

## 2024-01-16 DIAGNOSIS — N136 Pyonephrosis: Secondary | ICD-10-CM | POA: Diagnosis not present

## 2024-01-16 DIAGNOSIS — G3184 Mild cognitive impairment, so stated: Secondary | ICD-10-CM | POA: Diagnosis not present

## 2024-01-16 DIAGNOSIS — Z9981 Dependence on supplemental oxygen: Secondary | ICD-10-CM | POA: Diagnosis not present

## 2024-01-16 DIAGNOSIS — D510 Vitamin B12 deficiency anemia due to intrinsic factor deficiency: Secondary | ICD-10-CM | POA: Diagnosis not present

## 2024-01-16 DIAGNOSIS — I480 Paroxysmal atrial fibrillation: Secondary | ICD-10-CM | POA: Diagnosis not present

## 2024-01-16 DIAGNOSIS — M40209 Unspecified kyphosis, site unspecified: Secondary | ICD-10-CM | POA: Diagnosis not present

## 2024-01-16 DIAGNOSIS — Z7982 Long term (current) use of aspirin: Secondary | ICD-10-CM | POA: Diagnosis not present

## 2024-01-16 DIAGNOSIS — D630 Anemia in neoplastic disease: Secondary | ICD-10-CM | POA: Diagnosis not present

## 2024-01-16 DIAGNOSIS — Z7901 Long term (current) use of anticoagulants: Secondary | ICD-10-CM | POA: Diagnosis not present

## 2024-01-16 DIAGNOSIS — Z7902 Long term (current) use of antithrombotics/antiplatelets: Secondary | ICD-10-CM | POA: Diagnosis not present

## 2024-01-16 DIAGNOSIS — M810 Age-related osteoporosis without current pathological fracture: Secondary | ICD-10-CM | POA: Diagnosis not present

## 2024-01-16 DIAGNOSIS — I10 Essential (primary) hypertension: Secondary | ICD-10-CM | POA: Diagnosis not present

## 2024-01-16 DIAGNOSIS — Z792 Long term (current) use of antibiotics: Secondary | ICD-10-CM | POA: Diagnosis not present

## 2024-01-16 DIAGNOSIS — C679 Malignant neoplasm of bladder, unspecified: Secondary | ICD-10-CM | POA: Diagnosis not present

## 2024-01-16 DIAGNOSIS — R1310 Dysphagia, unspecified: Secondary | ICD-10-CM | POA: Diagnosis not present

## 2024-01-16 DIAGNOSIS — Z853 Personal history of malignant neoplasm of breast: Secondary | ICD-10-CM | POA: Diagnosis not present

## 2024-01-16 DIAGNOSIS — G540 Brachial plexus disorders: Secondary | ICD-10-CM | POA: Diagnosis not present

## 2024-01-16 DIAGNOSIS — I2119 ST elevation (STEMI) myocardial infarction involving other coronary artery of inferior wall: Secondary | ICD-10-CM | POA: Diagnosis not present

## 2024-01-16 DIAGNOSIS — I8393 Asymptomatic varicose veins of bilateral lower extremities: Secondary | ICD-10-CM | POA: Diagnosis not present

## 2024-01-18 DIAGNOSIS — C679 Malignant neoplasm of bladder, unspecified: Secondary | ICD-10-CM | POA: Diagnosis not present

## 2024-01-18 DIAGNOSIS — N136 Pyonephrosis: Secondary | ICD-10-CM | POA: Diagnosis not present

## 2024-01-18 DIAGNOSIS — B9620 Unspecified Escherichia coli [E. coli] as the cause of diseases classified elsewhere: Secondary | ICD-10-CM | POA: Diagnosis not present

## 2024-01-18 DIAGNOSIS — Z48812 Encounter for surgical aftercare following surgery on the circulatory system: Secondary | ICD-10-CM | POA: Diagnosis not present

## 2024-01-18 DIAGNOSIS — I2119 ST elevation (STEMI) myocardial infarction involving other coronary artery of inferior wall: Secondary | ICD-10-CM | POA: Diagnosis not present

## 2024-01-18 DIAGNOSIS — J9611 Chronic respiratory failure with hypoxia: Secondary | ICD-10-CM | POA: Diagnosis not present

## 2024-01-20 DIAGNOSIS — I509 Heart failure, unspecified: Secondary | ICD-10-CM | POA: Diagnosis not present

## 2024-01-20 DIAGNOSIS — I4891 Unspecified atrial fibrillation: Secondary | ICD-10-CM | POA: Diagnosis not present

## 2024-01-20 DIAGNOSIS — N136 Pyonephrosis: Secondary | ICD-10-CM | POA: Diagnosis not present

## 2024-01-20 DIAGNOSIS — J150 Pneumonia due to Klebsiella pneumoniae: Secondary | ICD-10-CM | POA: Diagnosis not present

## 2024-01-20 DIAGNOSIS — I2119 ST elevation (STEMI) myocardial infarction involving other coronary artery of inferior wall: Secondary | ICD-10-CM | POA: Diagnosis not present

## 2024-01-20 DIAGNOSIS — Z48812 Encounter for surgical aftercare following surgery on the circulatory system: Secondary | ICD-10-CM | POA: Diagnosis not present

## 2024-01-20 DIAGNOSIS — B9620 Unspecified Escherichia coli [E. coli] as the cause of diseases classified elsewhere: Secondary | ICD-10-CM | POA: Diagnosis not present

## 2024-01-20 DIAGNOSIS — J9611 Chronic respiratory failure with hypoxia: Secondary | ICD-10-CM | POA: Diagnosis not present

## 2024-01-20 DIAGNOSIS — C679 Malignant neoplasm of bladder, unspecified: Secondary | ICD-10-CM | POA: Diagnosis not present

## 2024-01-20 DIAGNOSIS — I480 Paroxysmal atrial fibrillation: Secondary | ICD-10-CM | POA: Diagnosis not present

## 2024-01-20 DIAGNOSIS — I10 Essential (primary) hypertension: Secondary | ICD-10-CM | POA: Diagnosis not present

## 2024-01-20 DIAGNOSIS — M8008XA Age-related osteoporosis with current pathological fracture, vertebra(e), initial encounter for fracture: Secondary | ICD-10-CM | POA: Diagnosis not present

## 2024-01-21 DIAGNOSIS — C679 Malignant neoplasm of bladder, unspecified: Secondary | ICD-10-CM | POA: Diagnosis not present

## 2024-01-21 DIAGNOSIS — D508 Other iron deficiency anemias: Secondary | ICD-10-CM | POA: Diagnosis not present

## 2024-01-21 DIAGNOSIS — I4891 Unspecified atrial fibrillation: Secondary | ICD-10-CM | POA: Diagnosis not present

## 2024-01-21 DIAGNOSIS — M810 Age-related osteoporosis without current pathological fracture: Secondary | ICD-10-CM | POA: Diagnosis not present

## 2024-01-21 DIAGNOSIS — Z6824 Body mass index (BMI) 24.0-24.9, adult: Secondary | ICD-10-CM | POA: Diagnosis not present

## 2024-01-21 DIAGNOSIS — N390 Urinary tract infection, site not specified: Secondary | ICD-10-CM | POA: Diagnosis not present

## 2024-01-21 DIAGNOSIS — E538 Deficiency of other specified B group vitamins: Secondary | ICD-10-CM | POA: Diagnosis not present

## 2024-01-21 DIAGNOSIS — J9811 Atelectasis: Secondary | ICD-10-CM | POA: Diagnosis not present

## 2024-01-21 DIAGNOSIS — F02A0 Dementia in other diseases classified elsewhere, mild, without behavioral disturbance, psychotic disturbance, mood disturbance, and anxiety: Secondary | ICD-10-CM | POA: Diagnosis not present

## 2024-01-21 DIAGNOSIS — I214 Non-ST elevation (NSTEMI) myocardial infarction: Secondary | ICD-10-CM | POA: Diagnosis not present

## 2024-01-21 DIAGNOSIS — G301 Alzheimer's disease with late onset: Secondary | ICD-10-CM | POA: Diagnosis not present

## 2024-01-21 DIAGNOSIS — F01B0 Vascular dementia, moderate, without behavioral disturbance, psychotic disturbance, mood disturbance, and anxiety: Secondary | ICD-10-CM | POA: Diagnosis not present

## 2024-01-21 DIAGNOSIS — D510 Vitamin B12 deficiency anemia due to intrinsic factor deficiency: Secondary | ICD-10-CM | POA: Diagnosis not present

## 2024-01-22 DIAGNOSIS — B9620 Unspecified Escherichia coli [E. coli] as the cause of diseases classified elsewhere: Secondary | ICD-10-CM | POA: Diagnosis not present

## 2024-01-22 DIAGNOSIS — Z48812 Encounter for surgical aftercare following surgery on the circulatory system: Secondary | ICD-10-CM | POA: Diagnosis not present

## 2024-01-22 DIAGNOSIS — J9611 Chronic respiratory failure with hypoxia: Secondary | ICD-10-CM | POA: Diagnosis not present

## 2024-01-22 DIAGNOSIS — N136 Pyonephrosis: Secondary | ICD-10-CM | POA: Diagnosis not present

## 2024-01-22 DIAGNOSIS — I2119 ST elevation (STEMI) myocardial infarction involving other coronary artery of inferior wall: Secondary | ICD-10-CM | POA: Diagnosis not present

## 2024-01-22 DIAGNOSIS — C679 Malignant neoplasm of bladder, unspecified: Secondary | ICD-10-CM | POA: Diagnosis not present

## 2024-01-25 DIAGNOSIS — B9620 Unspecified Escherichia coli [E. coli] as the cause of diseases classified elsewhere: Secondary | ICD-10-CM | POA: Diagnosis not present

## 2024-01-25 DIAGNOSIS — N136 Pyonephrosis: Secondary | ICD-10-CM | POA: Diagnosis not present

## 2024-01-25 DIAGNOSIS — J9611 Chronic respiratory failure with hypoxia: Secondary | ICD-10-CM | POA: Diagnosis not present

## 2024-01-25 DIAGNOSIS — I2119 ST elevation (STEMI) myocardial infarction involving other coronary artery of inferior wall: Secondary | ICD-10-CM | POA: Diagnosis not present

## 2024-01-25 DIAGNOSIS — Z48812 Encounter for surgical aftercare following surgery on the circulatory system: Secondary | ICD-10-CM | POA: Diagnosis not present

## 2024-01-25 DIAGNOSIS — C679 Malignant neoplasm of bladder, unspecified: Secondary | ICD-10-CM | POA: Diagnosis not present

## 2024-01-26 DIAGNOSIS — N136 Pyonephrosis: Secondary | ICD-10-CM | POA: Diagnosis not present

## 2024-01-26 DIAGNOSIS — I2119 ST elevation (STEMI) myocardial infarction involving other coronary artery of inferior wall: Secondary | ICD-10-CM | POA: Diagnosis not present

## 2024-01-26 DIAGNOSIS — C679 Malignant neoplasm of bladder, unspecified: Secondary | ICD-10-CM | POA: Diagnosis not present

## 2024-01-26 DIAGNOSIS — J9611 Chronic respiratory failure with hypoxia: Secondary | ICD-10-CM | POA: Diagnosis not present

## 2024-01-26 DIAGNOSIS — B9620 Unspecified Escherichia coli [E. coli] as the cause of diseases classified elsewhere: Secondary | ICD-10-CM | POA: Diagnosis not present

## 2024-01-26 DIAGNOSIS — Z48812 Encounter for surgical aftercare following surgery on the circulatory system: Secondary | ICD-10-CM | POA: Diagnosis not present

## 2024-01-28 DIAGNOSIS — B9620 Unspecified Escherichia coli [E. coli] as the cause of diseases classified elsewhere: Secondary | ICD-10-CM | POA: Diagnosis not present

## 2024-01-28 DIAGNOSIS — N136 Pyonephrosis: Secondary | ICD-10-CM | POA: Diagnosis not present

## 2024-01-28 DIAGNOSIS — J9611 Chronic respiratory failure with hypoxia: Secondary | ICD-10-CM | POA: Diagnosis not present

## 2024-01-28 DIAGNOSIS — I2119 ST elevation (STEMI) myocardial infarction involving other coronary artery of inferior wall: Secondary | ICD-10-CM | POA: Diagnosis not present

## 2024-01-28 DIAGNOSIS — Z48812 Encounter for surgical aftercare following surgery on the circulatory system: Secondary | ICD-10-CM | POA: Diagnosis not present

## 2024-01-28 DIAGNOSIS — C679 Malignant neoplasm of bladder, unspecified: Secondary | ICD-10-CM | POA: Diagnosis not present

## 2024-01-29 DIAGNOSIS — N136 Pyonephrosis: Secondary | ICD-10-CM | POA: Diagnosis not present

## 2024-01-29 DIAGNOSIS — I2119 ST elevation (STEMI) myocardial infarction involving other coronary artery of inferior wall: Secondary | ICD-10-CM | POA: Diagnosis not present

## 2024-01-29 DIAGNOSIS — J9611 Chronic respiratory failure with hypoxia: Secondary | ICD-10-CM | POA: Diagnosis not present

## 2024-01-29 DIAGNOSIS — B9620 Unspecified Escherichia coli [E. coli] as the cause of diseases classified elsewhere: Secondary | ICD-10-CM | POA: Diagnosis not present

## 2024-01-29 DIAGNOSIS — C679 Malignant neoplasm of bladder, unspecified: Secondary | ICD-10-CM | POA: Diagnosis not present

## 2024-01-29 DIAGNOSIS — Z48812 Encounter for surgical aftercare following surgery on the circulatory system: Secondary | ICD-10-CM | POA: Diagnosis not present

## 2024-02-01 DIAGNOSIS — I2119 ST elevation (STEMI) myocardial infarction involving other coronary artery of inferior wall: Secondary | ICD-10-CM | POA: Diagnosis not present

## 2024-02-01 DIAGNOSIS — C679 Malignant neoplasm of bladder, unspecified: Secondary | ICD-10-CM | POA: Diagnosis not present

## 2024-02-01 DIAGNOSIS — N136 Pyonephrosis: Secondary | ICD-10-CM | POA: Diagnosis not present

## 2024-02-01 DIAGNOSIS — B9620 Unspecified Escherichia coli [E. coli] as the cause of diseases classified elsewhere: Secondary | ICD-10-CM | POA: Diagnosis not present

## 2024-02-01 DIAGNOSIS — Z48812 Encounter for surgical aftercare following surgery on the circulatory system: Secondary | ICD-10-CM | POA: Diagnosis not present

## 2024-02-01 DIAGNOSIS — J9611 Chronic respiratory failure with hypoxia: Secondary | ICD-10-CM | POA: Diagnosis not present

## 2024-02-02 DIAGNOSIS — F01B0 Vascular dementia, moderate, without behavioral disturbance, psychotic disturbance, mood disturbance, and anxiety: Secondary | ICD-10-CM | POA: Diagnosis not present

## 2024-02-03 DIAGNOSIS — Z48812 Encounter for surgical aftercare following surgery on the circulatory system: Secondary | ICD-10-CM | POA: Diagnosis not present

## 2024-02-03 DIAGNOSIS — I2119 ST elevation (STEMI) myocardial infarction involving other coronary artery of inferior wall: Secondary | ICD-10-CM | POA: Diagnosis not present

## 2024-02-03 DIAGNOSIS — B9620 Unspecified Escherichia coli [E. coli] as the cause of diseases classified elsewhere: Secondary | ICD-10-CM | POA: Diagnosis not present

## 2024-02-03 DIAGNOSIS — N136 Pyonephrosis: Secondary | ICD-10-CM | POA: Diagnosis not present

## 2024-02-03 DIAGNOSIS — F01B0 Vascular dementia, moderate, without behavioral disturbance, psychotic disturbance, mood disturbance, and anxiety: Secondary | ICD-10-CM | POA: Diagnosis not present

## 2024-02-03 DIAGNOSIS — C679 Malignant neoplasm of bladder, unspecified: Secondary | ICD-10-CM | POA: Diagnosis not present

## 2024-02-03 DIAGNOSIS — J9611 Chronic respiratory failure with hypoxia: Secondary | ICD-10-CM | POA: Diagnosis not present

## 2024-02-04 DIAGNOSIS — J9611 Chronic respiratory failure with hypoxia: Secondary | ICD-10-CM | POA: Diagnosis not present

## 2024-02-04 DIAGNOSIS — F01B0 Vascular dementia, moderate, without behavioral disturbance, psychotic disturbance, mood disturbance, and anxiety: Secondary | ICD-10-CM | POA: Diagnosis not present

## 2024-02-04 DIAGNOSIS — N136 Pyonephrosis: Secondary | ICD-10-CM | POA: Diagnosis not present

## 2024-02-04 DIAGNOSIS — C679 Malignant neoplasm of bladder, unspecified: Secondary | ICD-10-CM | POA: Diagnosis not present

## 2024-02-04 DIAGNOSIS — I2119 ST elevation (STEMI) myocardial infarction involving other coronary artery of inferior wall: Secondary | ICD-10-CM | POA: Diagnosis not present

## 2024-02-04 DIAGNOSIS — B9620 Unspecified Escherichia coli [E. coli] as the cause of diseases classified elsewhere: Secondary | ICD-10-CM | POA: Diagnosis not present

## 2024-02-04 DIAGNOSIS — Z48812 Encounter for surgical aftercare following surgery on the circulatory system: Secondary | ICD-10-CM | POA: Diagnosis not present

## 2024-02-08 DIAGNOSIS — I480 Paroxysmal atrial fibrillation: Secondary | ICD-10-CM | POA: Diagnosis not present

## 2024-02-08 DIAGNOSIS — R9389 Abnormal findings on diagnostic imaging of other specified body structures: Secondary | ICD-10-CM | POA: Diagnosis not present

## 2024-02-08 DIAGNOSIS — I10 Essential (primary) hypertension: Secondary | ICD-10-CM | POA: Diagnosis not present

## 2024-02-08 DIAGNOSIS — F01B0 Vascular dementia, moderate, without behavioral disturbance, psychotic disturbance, mood disturbance, and anxiety: Secondary | ICD-10-CM | POA: Diagnosis not present

## 2024-02-08 DIAGNOSIS — I4891 Unspecified atrial fibrillation: Secondary | ICD-10-CM | POA: Diagnosis not present

## 2024-02-08 DIAGNOSIS — I2510 Atherosclerotic heart disease of native coronary artery without angina pectoris: Secondary | ICD-10-CM | POA: Diagnosis not present

## 2024-02-08 DIAGNOSIS — I509 Heart failure, unspecified: Secondary | ICD-10-CM | POA: Diagnosis not present

## 2024-02-08 DIAGNOSIS — I959 Hypotension, unspecified: Secondary | ICD-10-CM | POA: Diagnosis not present

## 2024-02-08 DIAGNOSIS — I2111 ST elevation (STEMI) myocardial infarction involving right coronary artery: Secondary | ICD-10-CM | POA: Diagnosis not present

## 2024-02-09 DIAGNOSIS — F01B0 Vascular dementia, moderate, without behavioral disturbance, psychotic disturbance, mood disturbance, and anxiety: Secondary | ICD-10-CM | POA: Diagnosis not present

## 2024-02-09 DIAGNOSIS — Z48812 Encounter for surgical aftercare following surgery on the circulatory system: Secondary | ICD-10-CM | POA: Diagnosis not present

## 2024-02-09 DIAGNOSIS — J9611 Chronic respiratory failure with hypoxia: Secondary | ICD-10-CM | POA: Diagnosis not present

## 2024-02-09 DIAGNOSIS — I2119 ST elevation (STEMI) myocardial infarction involving other coronary artery of inferior wall: Secondary | ICD-10-CM | POA: Diagnosis not present

## 2024-02-09 DIAGNOSIS — C679 Malignant neoplasm of bladder, unspecified: Secondary | ICD-10-CM | POA: Diagnosis not present

## 2024-02-09 DIAGNOSIS — B9620 Unspecified Escherichia coli [E. coli] as the cause of diseases classified elsewhere: Secondary | ICD-10-CM | POA: Diagnosis not present

## 2024-02-09 DIAGNOSIS — N136 Pyonephrosis: Secondary | ICD-10-CM | POA: Diagnosis not present

## 2024-02-10 DIAGNOSIS — F01B0 Vascular dementia, moderate, without behavioral disturbance, psychotic disturbance, mood disturbance, and anxiety: Secondary | ICD-10-CM | POA: Diagnosis not present

## 2024-02-11 DIAGNOSIS — J9611 Chronic respiratory failure with hypoxia: Secondary | ICD-10-CM | POA: Diagnosis not present

## 2024-02-11 DIAGNOSIS — I2119 ST elevation (STEMI) myocardial infarction involving other coronary artery of inferior wall: Secondary | ICD-10-CM | POA: Diagnosis not present

## 2024-02-11 DIAGNOSIS — C679 Malignant neoplasm of bladder, unspecified: Secondary | ICD-10-CM | POA: Diagnosis not present

## 2024-02-11 DIAGNOSIS — Z48812 Encounter for surgical aftercare following surgery on the circulatory system: Secondary | ICD-10-CM | POA: Diagnosis not present

## 2024-02-11 DIAGNOSIS — N136 Pyonephrosis: Secondary | ICD-10-CM | POA: Diagnosis not present

## 2024-02-11 DIAGNOSIS — B9620 Unspecified Escherichia coli [E. coli] as the cause of diseases classified elsewhere: Secondary | ICD-10-CM | POA: Diagnosis not present

## 2024-02-15 DIAGNOSIS — G3184 Mild cognitive impairment, so stated: Secondary | ICD-10-CM | POA: Diagnosis not present

## 2024-02-15 DIAGNOSIS — Z48812 Encounter for surgical aftercare following surgery on the circulatory system: Secondary | ICD-10-CM | POA: Diagnosis not present

## 2024-02-15 DIAGNOSIS — I8393 Asymptomatic varicose veins of bilateral lower extremities: Secondary | ICD-10-CM | POA: Diagnosis not present

## 2024-02-15 DIAGNOSIS — I10 Essential (primary) hypertension: Secondary | ICD-10-CM | POA: Diagnosis not present

## 2024-02-15 DIAGNOSIS — M810 Age-related osteoporosis without current pathological fracture: Secondary | ICD-10-CM | POA: Diagnosis not present

## 2024-02-15 DIAGNOSIS — I480 Paroxysmal atrial fibrillation: Secondary | ICD-10-CM | POA: Diagnosis not present

## 2024-02-15 DIAGNOSIS — G540 Brachial plexus disorders: Secondary | ICD-10-CM | POA: Diagnosis not present

## 2024-02-15 DIAGNOSIS — Z9981 Dependence on supplemental oxygen: Secondary | ICD-10-CM | POA: Diagnosis not present

## 2024-02-15 DIAGNOSIS — K219 Gastro-esophageal reflux disease without esophagitis: Secondary | ICD-10-CM | POA: Diagnosis not present

## 2024-02-15 DIAGNOSIS — B9620 Unspecified Escherichia coli [E. coli] as the cause of diseases classified elsewhere: Secondary | ICD-10-CM | POA: Diagnosis not present

## 2024-02-15 DIAGNOSIS — D630 Anemia in neoplastic disease: Secondary | ICD-10-CM | POA: Diagnosis not present

## 2024-02-15 DIAGNOSIS — D510 Vitamin B12 deficiency anemia due to intrinsic factor deficiency: Secondary | ICD-10-CM | POA: Diagnosis not present

## 2024-02-15 DIAGNOSIS — Z853 Personal history of malignant neoplasm of breast: Secondary | ICD-10-CM | POA: Diagnosis not present

## 2024-02-15 DIAGNOSIS — R1310 Dysphagia, unspecified: Secondary | ICD-10-CM | POA: Diagnosis not present

## 2024-02-15 DIAGNOSIS — K5790 Diverticulosis of intestine, part unspecified, without perforation or abscess without bleeding: Secondary | ICD-10-CM | POA: Diagnosis not present

## 2024-02-15 DIAGNOSIS — I2119 ST elevation (STEMI) myocardial infarction involving other coronary artery of inferior wall: Secondary | ICD-10-CM | POA: Diagnosis not present

## 2024-02-15 DIAGNOSIS — Z7902 Long term (current) use of antithrombotics/antiplatelets: Secondary | ICD-10-CM | POA: Diagnosis not present

## 2024-02-15 DIAGNOSIS — N136 Pyonephrosis: Secondary | ICD-10-CM | POA: Diagnosis not present

## 2024-02-15 DIAGNOSIS — Z7982 Long term (current) use of aspirin: Secondary | ICD-10-CM | POA: Diagnosis not present

## 2024-02-15 DIAGNOSIS — Z792 Long term (current) use of antibiotics: Secondary | ICD-10-CM | POA: Diagnosis not present

## 2024-02-15 DIAGNOSIS — C679 Malignant neoplasm of bladder, unspecified: Secondary | ICD-10-CM | POA: Diagnosis not present

## 2024-02-15 DIAGNOSIS — M40209 Unspecified kyphosis, site unspecified: Secondary | ICD-10-CM | POA: Diagnosis not present

## 2024-02-15 DIAGNOSIS — J9611 Chronic respiratory failure with hypoxia: Secondary | ICD-10-CM | POA: Diagnosis not present

## 2024-02-15 DIAGNOSIS — Z7901 Long term (current) use of anticoagulants: Secondary | ICD-10-CM | POA: Diagnosis not present

## 2024-02-15 DIAGNOSIS — K449 Diaphragmatic hernia without obstruction or gangrene: Secondary | ICD-10-CM | POA: Diagnosis not present

## 2024-02-17 DIAGNOSIS — J9611 Chronic respiratory failure with hypoxia: Secondary | ICD-10-CM | POA: Diagnosis not present

## 2024-02-17 DIAGNOSIS — I2119 ST elevation (STEMI) myocardial infarction involving other coronary artery of inferior wall: Secondary | ICD-10-CM | POA: Diagnosis not present

## 2024-02-17 DIAGNOSIS — C679 Malignant neoplasm of bladder, unspecified: Secondary | ICD-10-CM | POA: Diagnosis not present

## 2024-02-17 DIAGNOSIS — Z48812 Encounter for surgical aftercare following surgery on the circulatory system: Secondary | ICD-10-CM | POA: Diagnosis not present

## 2024-02-17 DIAGNOSIS — N136 Pyonephrosis: Secondary | ICD-10-CM | POA: Diagnosis not present

## 2024-02-17 DIAGNOSIS — B9620 Unspecified Escherichia coli [E. coli] as the cause of diseases classified elsewhere: Secondary | ICD-10-CM | POA: Diagnosis not present

## 2024-02-18 DIAGNOSIS — N136 Pyonephrosis: Secondary | ICD-10-CM | POA: Diagnosis not present

## 2024-02-18 DIAGNOSIS — I2119 ST elevation (STEMI) myocardial infarction involving other coronary artery of inferior wall: Secondary | ICD-10-CM | POA: Diagnosis not present

## 2024-02-18 DIAGNOSIS — B9620 Unspecified Escherichia coli [E. coli] as the cause of diseases classified elsewhere: Secondary | ICD-10-CM | POA: Diagnosis not present

## 2024-02-18 DIAGNOSIS — Z48812 Encounter for surgical aftercare following surgery on the circulatory system: Secondary | ICD-10-CM | POA: Diagnosis not present

## 2024-02-18 DIAGNOSIS — J9611 Chronic respiratory failure with hypoxia: Secondary | ICD-10-CM | POA: Diagnosis not present

## 2024-02-18 DIAGNOSIS — C679 Malignant neoplasm of bladder, unspecified: Secondary | ICD-10-CM | POA: Diagnosis not present

## 2024-02-20 DIAGNOSIS — I10 Essential (primary) hypertension: Secondary | ICD-10-CM | POA: Diagnosis not present

## 2024-02-20 DIAGNOSIS — I4891 Unspecified atrial fibrillation: Secondary | ICD-10-CM | POA: Diagnosis not present

## 2024-02-20 DIAGNOSIS — I480 Paroxysmal atrial fibrillation: Secondary | ICD-10-CM | POA: Diagnosis not present

## 2024-02-20 DIAGNOSIS — I509 Heart failure, unspecified: Secondary | ICD-10-CM | POA: Diagnosis not present

## 2024-02-20 DIAGNOSIS — J150 Pneumonia due to Klebsiella pneumoniae: Secondary | ICD-10-CM | POA: Diagnosis not present

## 2024-02-20 DIAGNOSIS — M8008XA Age-related osteoporosis with current pathological fracture, vertebra(e), initial encounter for fracture: Secondary | ICD-10-CM | POA: Diagnosis not present

## 2024-02-21 DIAGNOSIS — F01B0 Vascular dementia, moderate, without behavioral disturbance, psychotic disturbance, mood disturbance, and anxiety: Secondary | ICD-10-CM | POA: Diagnosis not present

## 2024-02-24 DIAGNOSIS — Z48812 Encounter for surgical aftercare following surgery on the circulatory system: Secondary | ICD-10-CM | POA: Diagnosis not present

## 2024-02-24 DIAGNOSIS — J9611 Chronic respiratory failure with hypoxia: Secondary | ICD-10-CM | POA: Diagnosis not present

## 2024-02-24 DIAGNOSIS — N136 Pyonephrosis: Secondary | ICD-10-CM | POA: Diagnosis not present

## 2024-02-24 DIAGNOSIS — C679 Malignant neoplasm of bladder, unspecified: Secondary | ICD-10-CM | POA: Diagnosis not present

## 2024-02-24 DIAGNOSIS — I2119 ST elevation (STEMI) myocardial infarction involving other coronary artery of inferior wall: Secondary | ICD-10-CM | POA: Diagnosis not present

## 2024-02-24 DIAGNOSIS — B9620 Unspecified Escherichia coli [E. coli] as the cause of diseases classified elsewhere: Secondary | ICD-10-CM | POA: Diagnosis not present

## 2024-02-25 DIAGNOSIS — Z48812 Encounter for surgical aftercare following surgery on the circulatory system: Secondary | ICD-10-CM | POA: Diagnosis not present

## 2024-02-25 DIAGNOSIS — J9611 Chronic respiratory failure with hypoxia: Secondary | ICD-10-CM | POA: Diagnosis not present

## 2024-02-25 DIAGNOSIS — C679 Malignant neoplasm of bladder, unspecified: Secondary | ICD-10-CM | POA: Diagnosis not present

## 2024-02-25 DIAGNOSIS — B9620 Unspecified Escherichia coli [E. coli] as the cause of diseases classified elsewhere: Secondary | ICD-10-CM | POA: Diagnosis not present

## 2024-02-25 DIAGNOSIS — N136 Pyonephrosis: Secondary | ICD-10-CM | POA: Diagnosis not present

## 2024-02-25 DIAGNOSIS — I2119 ST elevation (STEMI) myocardial infarction involving other coronary artery of inferior wall: Secondary | ICD-10-CM | POA: Diagnosis not present

## 2024-02-26 DIAGNOSIS — K449 Diaphragmatic hernia without obstruction or gangrene: Secondary | ICD-10-CM | POA: Diagnosis not present

## 2024-02-26 DIAGNOSIS — M40209 Unspecified kyphosis, site unspecified: Secondary | ICD-10-CM | POA: Diagnosis not present

## 2024-02-26 DIAGNOSIS — R053 Chronic cough: Secondary | ICD-10-CM | POA: Diagnosis not present

## 2024-02-29 DIAGNOSIS — N136 Pyonephrosis: Secondary | ICD-10-CM | POA: Diagnosis not present

## 2024-02-29 DIAGNOSIS — Z48812 Encounter for surgical aftercare following surgery on the circulatory system: Secondary | ICD-10-CM | POA: Diagnosis not present

## 2024-02-29 DIAGNOSIS — C679 Malignant neoplasm of bladder, unspecified: Secondary | ICD-10-CM | POA: Diagnosis not present

## 2024-02-29 DIAGNOSIS — J9611 Chronic respiratory failure with hypoxia: Secondary | ICD-10-CM | POA: Diagnosis not present

## 2024-02-29 DIAGNOSIS — I2119 ST elevation (STEMI) myocardial infarction involving other coronary artery of inferior wall: Secondary | ICD-10-CM | POA: Diagnosis not present

## 2024-02-29 DIAGNOSIS — B9620 Unspecified Escherichia coli [E. coli] as the cause of diseases classified elsewhere: Secondary | ICD-10-CM | POA: Diagnosis not present

## 2024-03-01 DIAGNOSIS — C679 Malignant neoplasm of bladder, unspecified: Secondary | ICD-10-CM | POA: Diagnosis not present

## 2024-03-02 DIAGNOSIS — N136 Pyonephrosis: Secondary | ICD-10-CM | POA: Diagnosis not present

## 2024-03-02 DIAGNOSIS — Z48812 Encounter for surgical aftercare following surgery on the circulatory system: Secondary | ICD-10-CM | POA: Diagnosis not present

## 2024-03-02 DIAGNOSIS — J9611 Chronic respiratory failure with hypoxia: Secondary | ICD-10-CM | POA: Diagnosis not present

## 2024-03-02 DIAGNOSIS — C679 Malignant neoplasm of bladder, unspecified: Secondary | ICD-10-CM | POA: Diagnosis not present

## 2024-03-02 DIAGNOSIS — B9620 Unspecified Escherichia coli [E. coli] as the cause of diseases classified elsewhere: Secondary | ICD-10-CM | POA: Diagnosis not present

## 2024-03-02 DIAGNOSIS — I2119 ST elevation (STEMI) myocardial infarction involving other coronary artery of inferior wall: Secondary | ICD-10-CM | POA: Diagnosis not present

## 2024-03-08 DIAGNOSIS — C679 Malignant neoplasm of bladder, unspecified: Secondary | ICD-10-CM | POA: Diagnosis not present

## 2024-03-08 DIAGNOSIS — D494 Neoplasm of unspecified behavior of bladder: Secondary | ICD-10-CM | POA: Diagnosis not present

## 2024-03-09 DIAGNOSIS — C679 Malignant neoplasm of bladder, unspecified: Secondary | ICD-10-CM | POA: Diagnosis not present

## 2024-03-09 DIAGNOSIS — B9620 Unspecified Escherichia coli [E. coli] as the cause of diseases classified elsewhere: Secondary | ICD-10-CM | POA: Diagnosis not present

## 2024-03-09 DIAGNOSIS — N136 Pyonephrosis: Secondary | ICD-10-CM | POA: Diagnosis not present

## 2024-03-09 DIAGNOSIS — I10 Essential (primary) hypertension: Secondary | ICD-10-CM | POA: Diagnosis not present

## 2024-03-09 DIAGNOSIS — I509 Heart failure, unspecified: Secondary | ICD-10-CM | POA: Diagnosis not present

## 2024-03-09 DIAGNOSIS — I2119 ST elevation (STEMI) myocardial infarction involving other coronary artery of inferior wall: Secondary | ICD-10-CM | POA: Diagnosis not present

## 2024-03-09 DIAGNOSIS — J9611 Chronic respiratory failure with hypoxia: Secondary | ICD-10-CM | POA: Diagnosis not present

## 2024-03-09 DIAGNOSIS — I4891 Unspecified atrial fibrillation: Secondary | ICD-10-CM | POA: Diagnosis not present

## 2024-03-09 DIAGNOSIS — Z48812 Encounter for surgical aftercare following surgery on the circulatory system: Secondary | ICD-10-CM | POA: Diagnosis not present

## 2024-03-09 DIAGNOSIS — I480 Paroxysmal atrial fibrillation: Secondary | ICD-10-CM | POA: Diagnosis not present

## 2024-03-11 DIAGNOSIS — J9611 Chronic respiratory failure with hypoxia: Secondary | ICD-10-CM | POA: Diagnosis not present

## 2024-03-11 DIAGNOSIS — N136 Pyonephrosis: Secondary | ICD-10-CM | POA: Diagnosis not present

## 2024-03-11 DIAGNOSIS — Z48812 Encounter for surgical aftercare following surgery on the circulatory system: Secondary | ICD-10-CM | POA: Diagnosis not present

## 2024-03-11 DIAGNOSIS — I2119 ST elevation (STEMI) myocardial infarction involving other coronary artery of inferior wall: Secondary | ICD-10-CM | POA: Diagnosis not present

## 2024-03-11 DIAGNOSIS — B9620 Unspecified Escherichia coli [E. coli] as the cause of diseases classified elsewhere: Secondary | ICD-10-CM | POA: Diagnosis not present

## 2024-03-11 DIAGNOSIS — C679 Malignant neoplasm of bladder, unspecified: Secondary | ICD-10-CM | POA: Diagnosis not present

## 2024-03-14 DIAGNOSIS — N136 Pyonephrosis: Secondary | ICD-10-CM | POA: Diagnosis not present

## 2024-03-14 DIAGNOSIS — Z48812 Encounter for surgical aftercare following surgery on the circulatory system: Secondary | ICD-10-CM | POA: Diagnosis not present

## 2024-03-14 DIAGNOSIS — C679 Malignant neoplasm of bladder, unspecified: Secondary | ICD-10-CM | POA: Diagnosis not present

## 2024-03-14 DIAGNOSIS — I2119 ST elevation (STEMI) myocardial infarction involving other coronary artery of inferior wall: Secondary | ICD-10-CM | POA: Diagnosis not present

## 2024-03-14 DIAGNOSIS — B9620 Unspecified Escherichia coli [E. coli] as the cause of diseases classified elsewhere: Secondary | ICD-10-CM | POA: Diagnosis not present

## 2024-03-14 DIAGNOSIS — J9611 Chronic respiratory failure with hypoxia: Secondary | ICD-10-CM | POA: Diagnosis not present

## 2024-03-16 DIAGNOSIS — D494 Neoplasm of unspecified behavior of bladder: Secondary | ICD-10-CM | POA: Diagnosis not present

## 2024-03-16 DIAGNOSIS — Z853 Personal history of malignant neoplasm of breast: Secondary | ICD-10-CM | POA: Diagnosis not present

## 2024-03-16 DIAGNOSIS — D630 Anemia in neoplastic disease: Secondary | ICD-10-CM | POA: Diagnosis not present

## 2024-03-16 DIAGNOSIS — G540 Brachial plexus disorders: Secondary | ICD-10-CM | POA: Diagnosis not present

## 2024-03-16 DIAGNOSIS — R1310 Dysphagia, unspecified: Secondary | ICD-10-CM | POA: Diagnosis not present

## 2024-03-16 DIAGNOSIS — M810 Age-related osteoporosis without current pathological fracture: Secondary | ICD-10-CM | POA: Diagnosis not present

## 2024-03-16 DIAGNOSIS — C679 Malignant neoplasm of bladder, unspecified: Secondary | ICD-10-CM | POA: Diagnosis not present

## 2024-03-16 DIAGNOSIS — Z7902 Long term (current) use of antithrombotics/antiplatelets: Secondary | ICD-10-CM | POA: Diagnosis not present

## 2024-03-16 DIAGNOSIS — Z9981 Dependence on supplemental oxygen: Secondary | ICD-10-CM | POA: Diagnosis not present

## 2024-03-16 DIAGNOSIS — J9611 Chronic respiratory failure with hypoxia: Secondary | ICD-10-CM | POA: Diagnosis not present

## 2024-03-16 DIAGNOSIS — I8393 Asymptomatic varicose veins of bilateral lower extremities: Secondary | ICD-10-CM | POA: Diagnosis not present

## 2024-03-16 DIAGNOSIS — K5790 Diverticulosis of intestine, part unspecified, without perforation or abscess without bleeding: Secondary | ICD-10-CM | POA: Diagnosis not present

## 2024-03-16 DIAGNOSIS — M40209 Unspecified kyphosis, site unspecified: Secondary | ICD-10-CM | POA: Diagnosis not present

## 2024-03-16 DIAGNOSIS — K219 Gastro-esophageal reflux disease without esophagitis: Secondary | ICD-10-CM | POA: Diagnosis not present

## 2024-03-16 DIAGNOSIS — D510 Vitamin B12 deficiency anemia due to intrinsic factor deficiency: Secondary | ICD-10-CM | POA: Diagnosis not present

## 2024-03-16 DIAGNOSIS — I480 Paroxysmal atrial fibrillation: Secondary | ICD-10-CM | POA: Diagnosis not present

## 2024-03-16 DIAGNOSIS — Z87440 Personal history of urinary (tract) infections: Secondary | ICD-10-CM | POA: Diagnosis not present

## 2024-03-16 DIAGNOSIS — Z87891 Personal history of nicotine dependence: Secondary | ICD-10-CM | POA: Diagnosis not present

## 2024-03-16 DIAGNOSIS — Z792 Long term (current) use of antibiotics: Secondary | ICD-10-CM | POA: Diagnosis not present

## 2024-03-16 DIAGNOSIS — I252 Old myocardial infarction: Secondary | ICD-10-CM | POA: Diagnosis not present

## 2024-03-16 DIAGNOSIS — G3184 Mild cognitive impairment, so stated: Secondary | ICD-10-CM | POA: Diagnosis not present

## 2024-03-16 DIAGNOSIS — I10 Essential (primary) hypertension: Secondary | ICD-10-CM | POA: Diagnosis not present

## 2024-03-16 DIAGNOSIS — Z7982 Long term (current) use of aspirin: Secondary | ICD-10-CM | POA: Diagnosis not present

## 2024-03-16 DIAGNOSIS — Z7901 Long term (current) use of anticoagulants: Secondary | ICD-10-CM | POA: Diagnosis not present

## 2024-03-16 DIAGNOSIS — K449 Diaphragmatic hernia without obstruction or gangrene: Secondary | ICD-10-CM | POA: Diagnosis not present

## 2024-03-16 DIAGNOSIS — Z87820 Personal history of traumatic brain injury: Secondary | ICD-10-CM | POA: Diagnosis not present

## 2024-03-20 ENCOUNTER — Other Ambulatory Visit: Payer: Self-pay | Admitting: Cardiology

## 2024-03-20 DIAGNOSIS — I480 Paroxysmal atrial fibrillation: Secondary | ICD-10-CM

## 2024-03-21 DIAGNOSIS — M8008XA Age-related osteoporosis with current pathological fracture, vertebra(e), initial encounter for fracture: Secondary | ICD-10-CM | POA: Diagnosis not present

## 2024-03-21 DIAGNOSIS — I4891 Unspecified atrial fibrillation: Secondary | ICD-10-CM | POA: Diagnosis not present

## 2024-03-21 DIAGNOSIS — J150 Pneumonia due to Klebsiella pneumoniae: Secondary | ICD-10-CM | POA: Diagnosis not present

## 2024-03-21 DIAGNOSIS — I509 Heart failure, unspecified: Secondary | ICD-10-CM | POA: Diagnosis not present

## 2024-03-21 DIAGNOSIS — I10 Essential (primary) hypertension: Secondary | ICD-10-CM | POA: Diagnosis not present

## 2024-03-21 DIAGNOSIS — I480 Paroxysmal atrial fibrillation: Secondary | ICD-10-CM | POA: Diagnosis not present

## 2024-03-21 NOTE — Telephone Encounter (Signed)
 Prescription refill request for Eliquis  received. Indication:afib Last office visit:9/24 Scr:0.70  4/25 Age: 89 Weight:50.8  kg  Prescription refilled

## 2024-03-22 DIAGNOSIS — D630 Anemia in neoplastic disease: Secondary | ICD-10-CM | POA: Diagnosis not present

## 2024-03-22 DIAGNOSIS — I252 Old myocardial infarction: Secondary | ICD-10-CM | POA: Diagnosis not present

## 2024-03-22 DIAGNOSIS — J9611 Chronic respiratory failure with hypoxia: Secondary | ICD-10-CM | POA: Diagnosis not present

## 2024-03-22 DIAGNOSIS — G3184 Mild cognitive impairment, so stated: Secondary | ICD-10-CM | POA: Diagnosis not present

## 2024-03-22 DIAGNOSIS — F01B0 Vascular dementia, moderate, without behavioral disturbance, psychotic disturbance, mood disturbance, and anxiety: Secondary | ICD-10-CM | POA: Diagnosis not present

## 2024-03-22 DIAGNOSIS — I480 Paroxysmal atrial fibrillation: Secondary | ICD-10-CM | POA: Diagnosis not present

## 2024-03-22 DIAGNOSIS — C679 Malignant neoplasm of bladder, unspecified: Secondary | ICD-10-CM | POA: Diagnosis not present

## 2024-03-29 DIAGNOSIS — J9611 Chronic respiratory failure with hypoxia: Secondary | ICD-10-CM | POA: Diagnosis not present

## 2024-03-29 DIAGNOSIS — C679 Malignant neoplasm of bladder, unspecified: Secondary | ICD-10-CM | POA: Diagnosis not present

## 2024-03-29 DIAGNOSIS — I252 Old myocardial infarction: Secondary | ICD-10-CM | POA: Diagnosis not present

## 2024-03-29 DIAGNOSIS — I480 Paroxysmal atrial fibrillation: Secondary | ICD-10-CM | POA: Diagnosis not present

## 2024-03-29 DIAGNOSIS — G3184 Mild cognitive impairment, so stated: Secondary | ICD-10-CM | POA: Diagnosis not present

## 2024-03-29 DIAGNOSIS — D630 Anemia in neoplastic disease: Secondary | ICD-10-CM | POA: Diagnosis not present

## 2024-04-04 DIAGNOSIS — I480 Paroxysmal atrial fibrillation: Secondary | ICD-10-CM | POA: Diagnosis not present

## 2024-04-04 DIAGNOSIS — G3184 Mild cognitive impairment, so stated: Secondary | ICD-10-CM | POA: Diagnosis not present

## 2024-04-04 DIAGNOSIS — J9611 Chronic respiratory failure with hypoxia: Secondary | ICD-10-CM | POA: Diagnosis not present

## 2024-04-04 DIAGNOSIS — D630 Anemia in neoplastic disease: Secondary | ICD-10-CM | POA: Diagnosis not present

## 2024-04-04 DIAGNOSIS — C679 Malignant neoplasm of bladder, unspecified: Secondary | ICD-10-CM | POA: Diagnosis not present

## 2024-04-04 DIAGNOSIS — I252 Old myocardial infarction: Secondary | ICD-10-CM | POA: Diagnosis not present

## 2024-04-08 DIAGNOSIS — I509 Heart failure, unspecified: Secondary | ICD-10-CM | POA: Diagnosis not present

## 2024-04-08 DIAGNOSIS — I10 Essential (primary) hypertension: Secondary | ICD-10-CM | POA: Diagnosis not present

## 2024-04-08 DIAGNOSIS — I480 Paroxysmal atrial fibrillation: Secondary | ICD-10-CM | POA: Diagnosis not present

## 2024-04-08 DIAGNOSIS — I4891 Unspecified atrial fibrillation: Secondary | ICD-10-CM | POA: Diagnosis not present

## 2024-04-12 DIAGNOSIS — D630 Anemia in neoplastic disease: Secondary | ICD-10-CM | POA: Diagnosis not present

## 2024-04-12 DIAGNOSIS — J9611 Chronic respiratory failure with hypoxia: Secondary | ICD-10-CM | POA: Diagnosis not present

## 2024-04-12 DIAGNOSIS — C679 Malignant neoplasm of bladder, unspecified: Secondary | ICD-10-CM | POA: Diagnosis not present

## 2024-04-12 DIAGNOSIS — I480 Paroxysmal atrial fibrillation: Secondary | ICD-10-CM | POA: Diagnosis not present

## 2024-04-12 DIAGNOSIS — G3184 Mild cognitive impairment, so stated: Secondary | ICD-10-CM | POA: Diagnosis not present

## 2024-04-12 DIAGNOSIS — I252 Old myocardial infarction: Secondary | ICD-10-CM | POA: Diagnosis not present

## 2024-04-13 DIAGNOSIS — J690 Pneumonitis due to inhalation of food and vomit: Secondary | ICD-10-CM | POA: Diagnosis not present

## 2024-04-13 DIAGNOSIS — R1312 Dysphagia, oropharyngeal phase: Secondary | ICD-10-CM | POA: Diagnosis not present

## 2024-04-13 DIAGNOSIS — Z6825 Body mass index (BMI) 25.0-25.9, adult: Secondary | ICD-10-CM | POA: Diagnosis not present

## 2024-04-15 DIAGNOSIS — C679 Malignant neoplasm of bladder, unspecified: Secondary | ICD-10-CM | POA: Diagnosis not present

## 2024-04-15 DIAGNOSIS — D630 Anemia in neoplastic disease: Secondary | ICD-10-CM | POA: Diagnosis not present

## 2024-04-18 DIAGNOSIS — D630 Anemia in neoplastic disease: Secondary | ICD-10-CM | POA: Diagnosis not present

## 2024-04-18 DIAGNOSIS — C679 Malignant neoplasm of bladder, unspecified: Secondary | ICD-10-CM | POA: Diagnosis not present

## 2024-04-21 DIAGNOSIS — M8008XA Age-related osteoporosis with current pathological fracture, vertebra(e), initial encounter for fracture: Secondary | ICD-10-CM | POA: Diagnosis not present

## 2024-04-21 DIAGNOSIS — I10 Essential (primary) hypertension: Secondary | ICD-10-CM | POA: Diagnosis not present

## 2024-04-21 DIAGNOSIS — I4891 Unspecified atrial fibrillation: Secondary | ICD-10-CM | POA: Diagnosis not present

## 2024-04-21 DIAGNOSIS — I480 Paroxysmal atrial fibrillation: Secondary | ICD-10-CM | POA: Diagnosis not present

## 2024-04-21 DIAGNOSIS — I509 Heart failure, unspecified: Secondary | ICD-10-CM | POA: Diagnosis not present

## 2024-04-21 DIAGNOSIS — J150 Pneumonia due to Klebsiella pneumoniae: Secondary | ICD-10-CM | POA: Diagnosis not present

## 2024-04-22 DIAGNOSIS — F01B0 Vascular dementia, moderate, without behavioral disturbance, psychotic disturbance, mood disturbance, and anxiety: Secondary | ICD-10-CM | POA: Diagnosis not present

## 2024-04-25 DIAGNOSIS — C679 Malignant neoplasm of bladder, unspecified: Secondary | ICD-10-CM | POA: Diagnosis not present

## 2024-04-25 DIAGNOSIS — D630 Anemia in neoplastic disease: Secondary | ICD-10-CM | POA: Diagnosis not present

## 2024-04-26 DIAGNOSIS — Z23 Encounter for immunization: Secondary | ICD-10-CM | POA: Diagnosis not present

## 2024-04-26 DIAGNOSIS — J690 Pneumonitis due to inhalation of food and vomit: Secondary | ICD-10-CM | POA: Diagnosis not present

## 2024-04-26 DIAGNOSIS — I4891 Unspecified atrial fibrillation: Secondary | ICD-10-CM | POA: Diagnosis not present

## 2024-04-26 DIAGNOSIS — Z6825 Body mass index (BMI) 25.0-25.9, adult: Secondary | ICD-10-CM | POA: Diagnosis not present

## 2024-04-26 DIAGNOSIS — M810 Age-related osteoporosis without current pathological fracture: Secondary | ICD-10-CM | POA: Diagnosis not present

## 2024-04-26 DIAGNOSIS — N390 Urinary tract infection, site not specified: Secondary | ICD-10-CM | POA: Diagnosis not present

## 2024-04-26 DIAGNOSIS — D510 Vitamin B12 deficiency anemia due to intrinsic factor deficiency: Secondary | ICD-10-CM | POA: Diagnosis not present

## 2024-04-26 DIAGNOSIS — G301 Alzheimer's disease with late onset: Secondary | ICD-10-CM | POA: Diagnosis not present

## 2024-04-26 DIAGNOSIS — I252 Old myocardial infarction: Secondary | ICD-10-CM | POA: Diagnosis not present

## 2024-04-26 DIAGNOSIS — C679 Malignant neoplasm of bladder, unspecified: Secondary | ICD-10-CM | POA: Diagnosis not present

## 2024-04-27 DIAGNOSIS — C679 Malignant neoplasm of bladder, unspecified: Secondary | ICD-10-CM | POA: Diagnosis not present

## 2024-05-02 DIAGNOSIS — Z881 Allergy status to other antibiotic agents status: Secondary | ICD-10-CM | POA: Diagnosis not present

## 2024-05-02 DIAGNOSIS — Z886 Allergy status to analgesic agent status: Secondary | ICD-10-CM | POA: Diagnosis not present

## 2024-05-02 DIAGNOSIS — I10 Essential (primary) hypertension: Secondary | ICD-10-CM | POA: Diagnosis not present

## 2024-05-02 DIAGNOSIS — Z7901 Long term (current) use of anticoagulants: Secondary | ICD-10-CM | POA: Diagnosis not present

## 2024-05-02 DIAGNOSIS — Z8701 Personal history of pneumonia (recurrent): Secondary | ICD-10-CM | POA: Diagnosis not present

## 2024-05-02 DIAGNOSIS — D414 Neoplasm of uncertain behavior of bladder: Secondary | ICD-10-CM | POA: Diagnosis not present

## 2024-05-02 DIAGNOSIS — Z8551 Personal history of malignant neoplasm of bladder: Secondary | ICD-10-CM | POA: Diagnosis not present

## 2024-05-02 DIAGNOSIS — K449 Diaphragmatic hernia without obstruction or gangrene: Secondary | ICD-10-CM | POA: Diagnosis not present

## 2024-05-02 DIAGNOSIS — Z91013 Allergy to seafood: Secondary | ICD-10-CM | POA: Diagnosis not present

## 2024-05-02 DIAGNOSIS — I4891 Unspecified atrial fibrillation: Secondary | ICD-10-CM | POA: Diagnosis not present

## 2024-05-02 DIAGNOSIS — Z7902 Long term (current) use of antithrombotics/antiplatelets: Secondary | ICD-10-CM | POA: Diagnosis not present

## 2024-05-02 DIAGNOSIS — Z882 Allergy status to sulfonamides status: Secondary | ICD-10-CM | POA: Diagnosis not present

## 2024-05-02 DIAGNOSIS — C679 Malignant neoplasm of bladder, unspecified: Secondary | ICD-10-CM | POA: Diagnosis not present

## 2024-05-02 DIAGNOSIS — Z79899 Other long term (current) drug therapy: Secondary | ICD-10-CM | POA: Diagnosis not present

## 2024-05-02 DIAGNOSIS — Z955 Presence of coronary angioplasty implant and graft: Secondary | ICD-10-CM | POA: Diagnosis not present

## 2024-05-02 DIAGNOSIS — C672 Malignant neoplasm of lateral wall of bladder: Secondary | ICD-10-CM | POA: Diagnosis not present

## 2024-05-02 DIAGNOSIS — I252 Old myocardial infarction: Secondary | ICD-10-CM | POA: Diagnosis not present

## 2024-05-02 DIAGNOSIS — Z888 Allergy status to other drugs, medicaments and biological substances status: Secondary | ICD-10-CM | POA: Diagnosis not present

## 2024-05-02 DIAGNOSIS — Z8709 Personal history of other diseases of the respiratory system: Secondary | ICD-10-CM | POA: Diagnosis not present

## 2024-05-02 DIAGNOSIS — I8393 Asymptomatic varicose veins of bilateral lower extremities: Secondary | ICD-10-CM | POA: Diagnosis not present

## 2024-05-02 DIAGNOSIS — Z79890 Hormone replacement therapy (postmenopausal): Secondary | ICD-10-CM | POA: Diagnosis not present

## 2024-05-02 DIAGNOSIS — K219 Gastro-esophageal reflux disease without esophagitis: Secondary | ICD-10-CM | POA: Diagnosis not present

## 2024-05-02 DIAGNOSIS — Z8619 Personal history of other infectious and parasitic diseases: Secondary | ICD-10-CM | POA: Diagnosis not present

## 2024-05-02 DIAGNOSIS — N179 Acute kidney failure, unspecified: Secondary | ICD-10-CM | POA: Diagnosis not present

## 2024-05-02 DIAGNOSIS — Z91040 Latex allergy status: Secondary | ICD-10-CM | POA: Diagnosis not present

## 2024-05-05 DIAGNOSIS — C679 Malignant neoplasm of bladder, unspecified: Secondary | ICD-10-CM | POA: Diagnosis not present

## 2024-05-05 DIAGNOSIS — D630 Anemia in neoplastic disease: Secondary | ICD-10-CM | POA: Diagnosis not present

## 2024-05-08 DIAGNOSIS — I509 Heart failure, unspecified: Secondary | ICD-10-CM | POA: Diagnosis not present

## 2024-05-08 DIAGNOSIS — I10 Essential (primary) hypertension: Secondary | ICD-10-CM | POA: Diagnosis not present

## 2024-05-08 DIAGNOSIS — I480 Paroxysmal atrial fibrillation: Secondary | ICD-10-CM | POA: Diagnosis not present

## 2024-05-08 DIAGNOSIS — I4891 Unspecified atrial fibrillation: Secondary | ICD-10-CM | POA: Diagnosis not present

## 2024-05-10 DIAGNOSIS — C679 Malignant neoplasm of bladder, unspecified: Secondary | ICD-10-CM | POA: Diagnosis not present

## 2024-05-13 DIAGNOSIS — R053 Chronic cough: Secondary | ICD-10-CM | POA: Diagnosis not present

## 2024-05-22 DIAGNOSIS — M8008XA Age-related osteoporosis with current pathological fracture, vertebra(e), initial encounter for fracture: Secondary | ICD-10-CM | POA: Diagnosis not present

## 2024-05-22 DIAGNOSIS — I480 Paroxysmal atrial fibrillation: Secondary | ICD-10-CM | POA: Diagnosis not present

## 2024-05-22 DIAGNOSIS — I509 Heart failure, unspecified: Secondary | ICD-10-CM | POA: Diagnosis not present

## 2024-05-22 DIAGNOSIS — J150 Pneumonia due to Klebsiella pneumoniae: Secondary | ICD-10-CM | POA: Diagnosis not present

## 2024-05-22 DIAGNOSIS — I10 Essential (primary) hypertension: Secondary | ICD-10-CM | POA: Diagnosis not present

## 2024-05-22 DIAGNOSIS — I4891 Unspecified atrial fibrillation: Secondary | ICD-10-CM | POA: Diagnosis not present

## 2024-05-23 DIAGNOSIS — F01B0 Vascular dementia, moderate, without behavioral disturbance, psychotic disturbance, mood disturbance, and anxiety: Secondary | ICD-10-CM | POA: Diagnosis not present

## 2024-06-07 DIAGNOSIS — I10 Essential (primary) hypertension: Secondary | ICD-10-CM | POA: Diagnosis not present

## 2024-06-07 DIAGNOSIS — I4891 Unspecified atrial fibrillation: Secondary | ICD-10-CM | POA: Diagnosis not present

## 2024-06-07 DIAGNOSIS — I509 Heart failure, unspecified: Secondary | ICD-10-CM | POA: Diagnosis not present

## 2024-06-07 DIAGNOSIS — I480 Paroxysmal atrial fibrillation: Secondary | ICD-10-CM | POA: Diagnosis not present

## 2024-06-21 DIAGNOSIS — M8008XA Age-related osteoporosis with current pathological fracture, vertebra(e), initial encounter for fracture: Secondary | ICD-10-CM | POA: Diagnosis not present

## 2024-06-21 DIAGNOSIS — J150 Pneumonia due to Klebsiella pneumoniae: Secondary | ICD-10-CM | POA: Diagnosis not present

## 2024-06-21 DIAGNOSIS — I4891 Unspecified atrial fibrillation: Secondary | ICD-10-CM | POA: Diagnosis not present

## 2024-06-21 DIAGNOSIS — I509 Heart failure, unspecified: Secondary | ICD-10-CM | POA: Diagnosis not present

## 2024-06-21 DIAGNOSIS — I10 Essential (primary) hypertension: Secondary | ICD-10-CM | POA: Diagnosis not present

## 2024-06-21 DIAGNOSIS — I480 Paroxysmal atrial fibrillation: Secondary | ICD-10-CM | POA: Diagnosis not present

## 2024-06-22 DIAGNOSIS — F01B0 Vascular dementia, moderate, without behavioral disturbance, psychotic disturbance, mood disturbance, and anxiety: Secondary | ICD-10-CM | POA: Diagnosis not present

## 2024-06-30 DIAGNOSIS — Z23 Encounter for immunization: Secondary | ICD-10-CM | POA: Diagnosis not present

## 2024-07-07 DIAGNOSIS — I4891 Unspecified atrial fibrillation: Secondary | ICD-10-CM | POA: Diagnosis not present

## 2024-07-07 DIAGNOSIS — I509 Heart failure, unspecified: Secondary | ICD-10-CM | POA: Diagnosis not present

## 2024-07-07 DIAGNOSIS — I10 Essential (primary) hypertension: Secondary | ICD-10-CM | POA: Diagnosis not present

## 2024-07-07 DIAGNOSIS — Z23 Encounter for immunization: Secondary | ICD-10-CM | POA: Diagnosis not present

## 2024-07-07 DIAGNOSIS — I480 Paroxysmal atrial fibrillation: Secondary | ICD-10-CM | POA: Diagnosis not present

## 2024-07-12 DIAGNOSIS — C679 Malignant neoplasm of bladder, unspecified: Secondary | ICD-10-CM | POA: Diagnosis not present

## 2024-07-12 DIAGNOSIS — R8290 Unspecified abnormal findings in urine: Secondary | ICD-10-CM | POA: Diagnosis not present

## 2024-07-19 DIAGNOSIS — C679 Malignant neoplasm of bladder, unspecified: Secondary | ICD-10-CM | POA: Diagnosis not present

## 2024-07-22 DIAGNOSIS — I4891 Unspecified atrial fibrillation: Secondary | ICD-10-CM | POA: Diagnosis not present

## 2024-07-22 DIAGNOSIS — M8008XA Age-related osteoporosis with current pathological fracture, vertebra(e), initial encounter for fracture: Secondary | ICD-10-CM | POA: Diagnosis not present

## 2024-07-22 DIAGNOSIS — J150 Pneumonia due to Klebsiella pneumoniae: Secondary | ICD-10-CM | POA: Diagnosis not present

## 2024-07-22 DIAGNOSIS — I10 Essential (primary) hypertension: Secondary | ICD-10-CM | POA: Diagnosis not present

## 2024-07-22 DIAGNOSIS — I509 Heart failure, unspecified: Secondary | ICD-10-CM | POA: Diagnosis not present

## 2024-07-22 DIAGNOSIS — I480 Paroxysmal atrial fibrillation: Secondary | ICD-10-CM | POA: Diagnosis not present

## 2024-07-23 DIAGNOSIS — F01B0 Vascular dementia, moderate, without behavioral disturbance, psychotic disturbance, mood disturbance, and anxiety: Secondary | ICD-10-CM | POA: Diagnosis not present

## 2024-07-26 DIAGNOSIS — C679 Malignant neoplasm of bladder, unspecified: Secondary | ICD-10-CM | POA: Diagnosis not present

## 2024-07-26 DIAGNOSIS — R8290 Unspecified abnormal findings in urine: Secondary | ICD-10-CM | POA: Diagnosis not present

## 2024-08-06 DIAGNOSIS — I509 Heart failure, unspecified: Secondary | ICD-10-CM | POA: Diagnosis not present

## 2024-08-06 DIAGNOSIS — I480 Paroxysmal atrial fibrillation: Secondary | ICD-10-CM | POA: Diagnosis not present

## 2024-08-06 DIAGNOSIS — I10 Essential (primary) hypertension: Secondary | ICD-10-CM | POA: Diagnosis not present

## 2024-08-06 DIAGNOSIS — I4891 Unspecified atrial fibrillation: Secondary | ICD-10-CM | POA: Diagnosis not present

## 2024-08-21 DIAGNOSIS — J150 Pneumonia due to Klebsiella pneumoniae: Secondary | ICD-10-CM | POA: Diagnosis not present

## 2024-08-21 DIAGNOSIS — I4891 Unspecified atrial fibrillation: Secondary | ICD-10-CM | POA: Diagnosis not present

## 2024-08-21 DIAGNOSIS — I10 Essential (primary) hypertension: Secondary | ICD-10-CM | POA: Diagnosis not present

## 2024-08-21 DIAGNOSIS — I509 Heart failure, unspecified: Secondary | ICD-10-CM | POA: Diagnosis not present

## 2024-08-21 DIAGNOSIS — M8008XA Age-related osteoporosis with current pathological fracture, vertebra(e), initial encounter for fracture: Secondary | ICD-10-CM | POA: Diagnosis not present

## 2024-08-21 DIAGNOSIS — I480 Paroxysmal atrial fibrillation: Secondary | ICD-10-CM | POA: Diagnosis not present

## 2024-08-22 DIAGNOSIS — F01B0 Vascular dementia, moderate, without behavioral disturbance, psychotic disturbance, mood disturbance, and anxiety: Secondary | ICD-10-CM | POA: Diagnosis not present

## 2024-08-23 DIAGNOSIS — I252 Old myocardial infarction: Secondary | ICD-10-CM | POA: Diagnosis not present

## 2024-08-23 DIAGNOSIS — Z23 Encounter for immunization: Secondary | ICD-10-CM | POA: Diagnosis not present

## 2024-08-23 DIAGNOSIS — F02B0 Dementia in other diseases classified elsewhere, moderate, without behavioral disturbance, psychotic disturbance, mood disturbance, and anxiety: Secondary | ICD-10-CM | POA: Diagnosis not present

## 2024-08-23 DIAGNOSIS — J9611 Chronic respiratory failure with hypoxia: Secondary | ICD-10-CM | POA: Diagnosis not present

## 2024-08-23 DIAGNOSIS — G301 Alzheimer's disease with late onset: Secondary | ICD-10-CM | POA: Diagnosis not present

## 2024-08-23 DIAGNOSIS — I4891 Unspecified atrial fibrillation: Secondary | ICD-10-CM | POA: Diagnosis not present

## 2024-08-23 DIAGNOSIS — I10 Essential (primary) hypertension: Secondary | ICD-10-CM | POA: Diagnosis not present

## 2024-08-23 DIAGNOSIS — Z6826 Body mass index (BMI) 26.0-26.9, adult: Secondary | ICD-10-CM | POA: Diagnosis not present

## 2024-08-23 DIAGNOSIS — D510 Vitamin B12 deficiency anemia due to intrinsic factor deficiency: Secondary | ICD-10-CM | POA: Diagnosis not present

## 2024-08-23 DIAGNOSIS — M810 Age-related osteoporosis without current pathological fracture: Secondary | ICD-10-CM | POA: Diagnosis not present

## 2024-08-23 DIAGNOSIS — C679 Malignant neoplasm of bladder, unspecified: Secondary | ICD-10-CM | POA: Diagnosis not present

## 2024-08-23 DIAGNOSIS — N390 Urinary tract infection, site not specified: Secondary | ICD-10-CM | POA: Diagnosis not present
